# Patient Record
Sex: Female | Born: 1950 | Race: White | Hispanic: No | Marital: Married | Smoking: Former smoker
Health system: Southern US, Community
[De-identification: ages and names within clinical notes are randomized; demographics above are authoritative.]

## PROBLEM LIST (undated history)

## (undated) DIAGNOSIS — R112 Nausea with vomiting, unspecified: Secondary | ICD-10-CM

## (undated) DIAGNOSIS — M1990 Unspecified osteoarthritis, unspecified site: Secondary | ICD-10-CM

## (undated) DIAGNOSIS — R002 Palpitations: Secondary | ICD-10-CM

## (undated) DIAGNOSIS — Z9889 Other specified postprocedural states: Secondary | ICD-10-CM

## (undated) DIAGNOSIS — G479 Sleep disorder, unspecified: Secondary | ICD-10-CM

## (undated) DIAGNOSIS — T8859XA Other complications of anesthesia, initial encounter: Secondary | ICD-10-CM

## (undated) DIAGNOSIS — C439 Malignant melanoma of skin, unspecified: Secondary | ICD-10-CM

## (undated) DIAGNOSIS — IMO0001 Reserved for inherently not codable concepts without codable children: Secondary | ICD-10-CM

## (undated) DIAGNOSIS — M8580 Other specified disorders of bone density and structure, unspecified site: Secondary | ICD-10-CM

## (undated) DIAGNOSIS — I341 Nonrheumatic mitral (valve) prolapse: Secondary | ICD-10-CM

## (undated) DIAGNOSIS — B009 Herpesviral infection, unspecified: Secondary | ICD-10-CM

## (undated) DIAGNOSIS — N6009 Solitary cyst of unspecified breast: Secondary | ICD-10-CM

## (undated) DIAGNOSIS — T4145XA Adverse effect of unspecified anesthetic, initial encounter: Secondary | ICD-10-CM

## (undated) DIAGNOSIS — C4491 Basal cell carcinoma of skin, unspecified: Secondary | ICD-10-CM

## (undated) DIAGNOSIS — E785 Hyperlipidemia, unspecified: Secondary | ICD-10-CM

## (undated) DIAGNOSIS — L309 Dermatitis, unspecified: Secondary | ICD-10-CM

## (undated) DIAGNOSIS — K219 Gastro-esophageal reflux disease without esophagitis: Secondary | ICD-10-CM

## (undated) DIAGNOSIS — S83209A Unspecified tear of unspecified meniscus, current injury, unspecified knee, initial encounter: Secondary | ICD-10-CM

## (undated) DIAGNOSIS — K449 Diaphragmatic hernia without obstruction or gangrene: Secondary | ICD-10-CM

## (undated) HISTORY — DX: Herpesviral infection, unspecified: B00.9

## (undated) HISTORY — PX: MOLE REMOVAL: SHX2046

## (undated) HISTORY — PX: TONSILLECTOMY: SUR1361

## (undated) HISTORY — DX: Basal cell carcinoma of skin, unspecified: C44.91

## (undated) HISTORY — DX: Solitary cyst of unspecified breast: N60.09

## (undated) HISTORY — DX: Dermatitis, unspecified: L30.9

## (undated) HISTORY — DX: Other specified disorders of bone density and structure, unspecified site: M85.80

## (undated) HISTORY — PX: COLONOSCOPY: SHX174

## (undated) HISTORY — DX: Gastro-esophageal reflux disease without esophagitis: K21.9

## (undated) HISTORY — DX: Hyperlipidemia, unspecified: E78.5

## (undated) HISTORY — PX: JOINT REPLACEMENT: SHX530

## (undated) HISTORY — PX: MOHS SURGERY: SUR867

---

## 1987-10-08 HISTORY — PX: TUBAL LIGATION: SHX77

## 1993-10-07 HISTORY — PX: MELANOMA EXCISION: SHX5266

## 1995-04-07 ENCOUNTER — Encounter: Payer: Self-pay | Admitting: Family Medicine

## 1995-04-07 LAB — CONVERTED CEMR LAB

## 1998-03-24 ENCOUNTER — Other Ambulatory Visit: Admission: RE | Admit: 1998-03-24 | Discharge: 1998-03-24 | Payer: Self-pay | Admitting: Gynecology

## 1998-04-06 ENCOUNTER — Encounter: Payer: Self-pay | Admitting: Family Medicine

## 1998-04-06 LAB — CONVERTED CEMR LAB

## 1998-10-07 HISTORY — PX: HYSTEROSCOPY WITH RESECTOSCOPE: SHX5395

## 1998-12-06 HISTORY — PX: OTHER SURGICAL HISTORY: SHX169

## 1999-05-08 ENCOUNTER — Encounter: Payer: Self-pay | Admitting: Family Medicine

## 1999-05-08 LAB — CONVERTED CEMR LAB

## 1999-07-10 ENCOUNTER — Other Ambulatory Visit: Admission: RE | Admit: 1999-07-10 | Discharge: 1999-07-10 | Payer: Self-pay | Admitting: Gynecology

## 1999-08-06 ENCOUNTER — Encounter (INDEPENDENT_AMBULATORY_CARE_PROVIDER_SITE_OTHER): Payer: Self-pay

## 1999-08-06 ENCOUNTER — Ambulatory Visit (HOSPITAL_COMMUNITY): Admission: RE | Admit: 1999-08-06 | Discharge: 1999-08-06 | Payer: Self-pay | Admitting: Gynecology

## 1999-10-03 ENCOUNTER — Encounter: Payer: Self-pay | Admitting: Gynecology

## 1999-10-03 ENCOUNTER — Encounter: Admission: RE | Admit: 1999-10-03 | Discharge: 1999-10-03 | Payer: Self-pay | Admitting: Gynecology

## 1999-10-08 HISTORY — PX: VAGINAL HYSTERECTOMY: SUR661

## 1999-10-09 ENCOUNTER — Encounter: Payer: Self-pay | Admitting: Family Medicine

## 1999-10-09 LAB — CONVERTED CEMR LAB

## 1999-12-06 ENCOUNTER — Observation Stay (HOSPITAL_COMMUNITY): Admission: RE | Admit: 1999-12-06 | Discharge: 1999-12-07 | Payer: Self-pay | Admitting: Gynecology

## 2000-07-21 ENCOUNTER — Other Ambulatory Visit: Admission: RE | Admit: 2000-07-21 | Discharge: 2000-07-21 | Payer: Self-pay | Admitting: Gynecology

## 2001-10-12 ENCOUNTER — Other Ambulatory Visit: Admission: RE | Admit: 2001-10-12 | Discharge: 2001-10-12 | Payer: Self-pay | Admitting: Gynecology

## 2002-02-16 ENCOUNTER — Encounter: Admission: RE | Admit: 2002-02-16 | Discharge: 2002-02-16 | Payer: Self-pay | Admitting: Gynecology

## 2002-02-16 ENCOUNTER — Encounter: Payer: Self-pay | Admitting: Gynecology

## 2002-10-07 LAB — HM DEXA SCAN

## 2002-11-08 ENCOUNTER — Other Ambulatory Visit: Admission: RE | Admit: 2002-11-08 | Discharge: 2002-11-08 | Payer: Self-pay | Admitting: Gynecology

## 2003-03-14 ENCOUNTER — Encounter: Payer: Self-pay | Admitting: Gynecology

## 2003-03-14 ENCOUNTER — Encounter: Admission: RE | Admit: 2003-03-14 | Discharge: 2003-03-14 | Payer: Self-pay | Admitting: Gynecology

## 2003-11-28 ENCOUNTER — Encounter: Admission: RE | Admit: 2003-11-28 | Discharge: 2003-11-28 | Payer: Self-pay | Admitting: Family Medicine

## 2003-11-28 ENCOUNTER — Other Ambulatory Visit: Admission: RE | Admit: 2003-11-28 | Discharge: 2003-11-28 | Payer: Self-pay | Admitting: Gynecology

## 2003-12-08 LAB — FECAL OCCULT BLOOD, GUAIAC: Fecal Occult Blood: NEGATIVE

## 2004-06-07 HISTORY — PX: OTHER SURGICAL HISTORY: SHX169

## 2004-10-02 ENCOUNTER — Ambulatory Visit: Payer: Self-pay | Admitting: Family Medicine

## 2004-10-24 ENCOUNTER — Encounter: Admission: RE | Admit: 2004-10-24 | Discharge: 2004-10-24 | Payer: Self-pay | Admitting: Gynecology

## 2004-11-21 ENCOUNTER — Ambulatory Visit: Payer: Self-pay | Admitting: Family Medicine

## 2004-11-23 ENCOUNTER — Ambulatory Visit: Payer: Self-pay | Admitting: Family Medicine

## 2004-11-28 ENCOUNTER — Ambulatory Visit: Payer: Self-pay | Admitting: *Deleted

## 2004-12-03 ENCOUNTER — Other Ambulatory Visit: Admission: RE | Admit: 2004-12-03 | Discharge: 2004-12-03 | Payer: Self-pay | Admitting: Gynecology

## 2004-12-05 ENCOUNTER — Ambulatory Visit: Payer: Self-pay | Admitting: Internal Medicine

## 2004-12-14 ENCOUNTER — Ambulatory Visit: Payer: Self-pay | Admitting: Internal Medicine

## 2004-12-14 LAB — HM COLONOSCOPY

## 2004-12-25 ENCOUNTER — Ambulatory Visit: Payer: Self-pay | Admitting: Internal Medicine

## 2005-02-04 ENCOUNTER — Ambulatory Visit: Payer: Self-pay | Admitting: Family Medicine

## 2005-06-19 ENCOUNTER — Ambulatory Visit: Payer: Self-pay | Admitting: Family Medicine

## 2005-07-07 HISTORY — PX: OTHER SURGICAL HISTORY: SHX169

## 2005-07-08 ENCOUNTER — Other Ambulatory Visit: Admission: RE | Admit: 2005-07-08 | Discharge: 2005-07-08 | Payer: Self-pay | Admitting: Gynecology

## 2005-07-29 ENCOUNTER — Ambulatory Visit: Payer: Self-pay

## 2005-09-06 ENCOUNTER — Encounter: Payer: Self-pay | Admitting: Family Medicine

## 2005-09-06 LAB — CONVERTED CEMR LAB

## 2005-11-11 ENCOUNTER — Ambulatory Visit: Payer: Self-pay | Admitting: Family Medicine

## 2005-12-06 ENCOUNTER — Other Ambulatory Visit: Admission: RE | Admit: 2005-12-06 | Discharge: 2005-12-06 | Payer: Self-pay | Admitting: Gynecology

## 2005-12-11 ENCOUNTER — Encounter: Admission: RE | Admit: 2005-12-11 | Discharge: 2005-12-11 | Payer: Self-pay | Admitting: Gynecology

## 2006-01-27 ENCOUNTER — Ambulatory Visit: Payer: Self-pay | Admitting: Family Medicine

## 2006-05-09 ENCOUNTER — Ambulatory Visit: Payer: Self-pay | Admitting: Internal Medicine

## 2006-05-14 ENCOUNTER — Ambulatory Visit: Payer: Self-pay

## 2006-05-21 ENCOUNTER — Ambulatory Visit: Payer: Self-pay | Admitting: *Deleted

## 2006-06-03 ENCOUNTER — Ambulatory Visit: Payer: Self-pay | Admitting: Family Medicine

## 2006-06-07 HISTORY — PX: ESOPHAGOGASTRODUODENOSCOPY: SHX1529

## 2006-06-10 ENCOUNTER — Encounter: Admission: RE | Admit: 2006-06-10 | Discharge: 2006-06-10 | Payer: Self-pay | Admitting: Family Medicine

## 2006-06-19 ENCOUNTER — Ambulatory Visit: Payer: Self-pay | Admitting: Internal Medicine

## 2006-06-23 ENCOUNTER — Ambulatory Visit: Payer: Self-pay | Admitting: Internal Medicine

## 2006-06-27 ENCOUNTER — Ambulatory Visit: Payer: Self-pay | Admitting: Family Medicine

## 2006-07-02 ENCOUNTER — Ambulatory Visit: Payer: Self-pay | Admitting: Cardiology

## 2006-08-20 ENCOUNTER — Ambulatory Visit: Payer: Self-pay | Admitting: Family Medicine

## 2006-10-18 ENCOUNTER — Encounter: Admission: RE | Admit: 2006-10-18 | Discharge: 2006-10-18 | Payer: Self-pay | Admitting: Family Medicine

## 2006-10-31 ENCOUNTER — Ambulatory Visit: Payer: Self-pay | Admitting: Family Medicine

## 2006-12-03 ENCOUNTER — Ambulatory Visit: Payer: Self-pay | Admitting: Internal Medicine

## 2006-12-03 LAB — CONVERTED CEMR LAB
Bilirubin Urine: NEGATIVE
Ketones, ur: NEGATIVE mg/dL
Leukocytes, UA: NEGATIVE
Specific Gravity, Urine: 1.01 (ref 1.000–1.03)
Urine Glucose: NEGATIVE mg/dL
pH: 6.5 (ref 5.0–8.0)

## 2006-12-06 LAB — CONVERTED CEMR LAB: Pap Smear: NORMAL

## 2006-12-15 ENCOUNTER — Other Ambulatory Visit: Admission: RE | Admit: 2006-12-15 | Discharge: 2006-12-15 | Payer: Self-pay | Admitting: Gynecology

## 2006-12-25 ENCOUNTER — Ambulatory Visit: Payer: Self-pay | Admitting: Family Medicine

## 2006-12-30 ENCOUNTER — Ambulatory Visit: Payer: Self-pay | Admitting: Family Medicine

## 2006-12-30 DIAGNOSIS — A6000 Herpesviral infection of urogenital system, unspecified: Secondary | ICD-10-CM | POA: Insufficient documentation

## 2007-01-12 ENCOUNTER — Encounter: Payer: Self-pay | Admitting: Family Medicine

## 2007-01-12 DIAGNOSIS — K449 Diaphragmatic hernia without obstruction or gangrene: Secondary | ICD-10-CM | POA: Insufficient documentation

## 2007-01-28 ENCOUNTER — Encounter: Admission: RE | Admit: 2007-01-28 | Discharge: 2007-01-28 | Payer: Self-pay | Admitting: Gynecology

## 2007-02-05 DIAGNOSIS — C4491 Basal cell carcinoma of skin, unspecified: Secondary | ICD-10-CM

## 2007-02-05 HISTORY — DX: Basal cell carcinoma of skin, unspecified: C44.91

## 2007-03-11 ENCOUNTER — Ambulatory Visit: Payer: Self-pay | Admitting: Family Medicine

## 2007-03-11 DIAGNOSIS — R5381 Other malaise: Secondary | ICD-10-CM | POA: Insufficient documentation

## 2007-03-11 DIAGNOSIS — R5383 Other fatigue: Secondary | ICD-10-CM

## 2007-03-13 ENCOUNTER — Ambulatory Visit: Payer: Self-pay | Admitting: Family Medicine

## 2007-03-13 DIAGNOSIS — E7800 Pure hypercholesterolemia, unspecified: Secondary | ICD-10-CM | POA: Insufficient documentation

## 2007-03-16 LAB — CONVERTED CEMR LAB
ALT: 21 units/L (ref 0–40)
AST: 24 units/L (ref 0–37)
Albumin: 3.8 g/dL (ref 3.5–5.2)
Alkaline Phosphatase: 57 units/L (ref 39–117)
BUN: 12 mg/dL (ref 6–23)
Basophils Absolute: 0.1 10*3/uL (ref 0.0–0.1)
Basophils Relative: 1.5 % — ABNORMAL HIGH (ref 0.0–1.0)
Bilirubin, Direct: 0.1 mg/dL (ref 0.0–0.3)
CO2: 31 meq/L (ref 19–32)
Calcium: 9.5 mg/dL (ref 8.4–10.5)
Chloride: 104 meq/L (ref 96–112)
Cholesterol: 176 mg/dL (ref 0–200)
Creatinine, Ser: 0.8 mg/dL (ref 0.4–1.2)
Eosinophils Absolute: 0.1 10*3/uL (ref 0.0–0.6)
Eosinophils Relative: 1.9 % (ref 0.0–5.0)
GFR calc Af Amer: 96 mL/min
GFR calc non Af Amer: 79 mL/min
Glucose, Bld: 86 mg/dL (ref 70–99)
HCT: 40.7 % (ref 36.0–46.0)
HDL: 38.2 mg/dL — ABNORMAL LOW (ref >39.0)
Hemoglobin: 14.4 g/dL (ref 12.0–15.0)
LDL Cholesterol: 105 mg/dL — ABNORMAL HIGH (ref 0–99)
Lymphocytes Relative: 38.3 % (ref 12.0–46.0)
MCHC: 35.4 g/dL (ref 30.0–36.0)
MCV: 94.1 fL (ref 78.0–100.0)
Monocytes Absolute: 0.3 10*3/uL (ref 0.2–0.7)
Monocytes Relative: 7.1 % (ref 3.0–11.0)
Neutro Abs: 2.3 10*3/uL (ref 1.4–7.7)
Neutrophils Relative %: 51.2 % (ref 43.0–77.0)
Platelets: 185 10*3/uL (ref 150–400)
Potassium: 4 meq/L (ref 3.5–5.1)
RBC: 4.32 M/uL (ref 3.87–5.11)
RDW: 12.1 % (ref 11.5–14.6)
Sodium: 141 meq/L (ref 135–145)
TSH: 1.29 microintl units/mL (ref 0.35–5.50)
Total Bilirubin: 0.9 mg/dL (ref 0.3–1.2)
Total CHOL/HDL Ratio: 4.6
Total Protein: 6.6 g/dL (ref 6.0–8.3)
Triglycerides: 163 mg/dL — ABNORMAL HIGH (ref 0–149)
VLDL: 33 mg/dL (ref 0–40)
WBC: 4.5 10*3/uL (ref 4.5–10.5)

## 2007-03-17 ENCOUNTER — Telehealth (INDEPENDENT_AMBULATORY_CARE_PROVIDER_SITE_OTHER): Payer: Self-pay | Admitting: *Deleted

## 2007-05-29 ENCOUNTER — Ambulatory Visit: Payer: Self-pay | Admitting: Cardiovascular Disease

## 2007-05-29 ENCOUNTER — Telehealth (INDEPENDENT_AMBULATORY_CARE_PROVIDER_SITE_OTHER): Payer: Self-pay | Admitting: *Deleted

## 2007-05-29 ENCOUNTER — Ambulatory Visit: Payer: Self-pay | Admitting: Family Medicine

## 2007-10-09 ENCOUNTER — Ambulatory Visit: Payer: Self-pay | Admitting: Family Medicine

## 2007-10-09 LAB — CONVERTED CEMR LAB
Bilirubin Urine: NEGATIVE
Ketones, urine, test strip: NEGATIVE
Nitrite: NEGATIVE
Protein, U semiquant: NEGATIVE
Urobilinogen, UA: 0.2
pH: 5

## 2007-10-10 ENCOUNTER — Encounter: Payer: Self-pay | Admitting: Family Medicine

## 2007-12-16 ENCOUNTER — Other Ambulatory Visit: Admission: RE | Admit: 2007-12-16 | Discharge: 2007-12-16 | Payer: Self-pay | Admitting: Gynecology

## 2008-02-12 ENCOUNTER — Encounter (INDEPENDENT_AMBULATORY_CARE_PROVIDER_SITE_OTHER): Payer: Self-pay | Admitting: *Deleted

## 2008-03-14 ENCOUNTER — Ambulatory Visit: Payer: Self-pay | Admitting: Family Medicine

## 2008-03-14 DIAGNOSIS — M8580 Other specified disorders of bone density and structure, unspecified site: Secondary | ICD-10-CM

## 2008-03-15 LAB — CONVERTED CEMR LAB
AST: 29 units/L (ref 0–37)
Albumin: 4.1 g/dL (ref 3.5–5.2)
Alkaline Phosphatase: 64 units/L (ref 39–117)
BUN: 10 mg/dL (ref 6–23)
Bilirubin, Direct: 0.1 mg/dL (ref 0.0–0.3)
CO2: 31 meq/L (ref 19–32)
Chloride: 103 meq/L (ref 96–112)
Eosinophils Relative: 1.9 % (ref 0.0–5.0)
Glucose, Bld: 80 mg/dL (ref 70–99)
HCT: 42.9 % (ref 36.0–46.0)
HDL: 38.9 mg/dL — ABNORMAL LOW (ref >39.0)
LDL Cholesterol: 91 mg/dL (ref 0–99)
Lymphocytes Relative: 39 % (ref 12.0–46.0)
Monocytes Relative: 5.8 % (ref 3.0–12.0)
Neutrophils Relative %: 52.6 % (ref 43.0–77.0)
Platelets: 215 10*3/uL (ref 150–400)
Potassium: 4.2 meq/L (ref 3.5–5.1)
RDW: 12.1 % (ref 11.5–14.6)
Sodium: 141 meq/L (ref 135–145)
Total CHOL/HDL Ratio: 4.2
Total Protein: 6.9 g/dL (ref 6.0–8.3)
Triglycerides: 163 mg/dL — ABNORMAL HIGH (ref 0–149)
VLDL: 33 mg/dL (ref 0–40)
WBC: 4.1 10*3/uL — ABNORMAL LOW (ref 4.5–10.5)

## 2008-03-17 ENCOUNTER — Encounter: Payer: Self-pay | Admitting: Family Medicine

## 2008-03-30 ENCOUNTER — Ambulatory Visit: Payer: Self-pay | Admitting: Internal Medicine

## 2008-03-30 LAB — CONVERTED CEMR LAB
Bacteria, UA: 0
Glucose, Urine, Semiquant: NEGATIVE
Ketones, urine, test strip: NEGATIVE
Specific Gravity, Urine: 1.025
Urobilinogen, UA: 0.2

## 2008-03-31 ENCOUNTER — Encounter: Payer: Self-pay | Admitting: Family Medicine

## 2008-05-04 ENCOUNTER — Ambulatory Visit: Payer: Self-pay | Admitting: Family Medicine

## 2008-05-06 ENCOUNTER — Encounter: Payer: Self-pay | Admitting: Family Medicine

## 2008-05-06 LAB — CONVERTED CEMR LAB: Vit D, 1,25-Dihydroxy: 46 (ref 30–89)

## 2008-05-16 ENCOUNTER — Encounter: Admission: RE | Admit: 2008-05-16 | Discharge: 2008-05-16 | Payer: Self-pay | Admitting: Gynecology

## 2008-06-17 ENCOUNTER — Ambulatory Visit: Payer: Self-pay | Admitting: Family Medicine

## 2008-06-20 ENCOUNTER — Encounter: Admission: RE | Admit: 2008-06-20 | Discharge: 2008-06-20 | Payer: Self-pay | Admitting: Family Medicine

## 2008-06-21 ENCOUNTER — Telehealth (INDEPENDENT_AMBULATORY_CARE_PROVIDER_SITE_OTHER): Payer: Self-pay | Admitting: Internal Medicine

## 2008-07-28 ENCOUNTER — Ambulatory Visit: Payer: Self-pay | Admitting: Family Medicine

## 2008-07-28 DIAGNOSIS — K219 Gastro-esophageal reflux disease without esophagitis: Secondary | ICD-10-CM | POA: Insufficient documentation

## 2008-07-28 LAB — CONVERTED CEMR LAB
Bilirubin Urine: NEGATIVE
Glucose, Urine, Semiquant: NEGATIVE
Protein, U semiquant: NEGATIVE
Specific Gravity, Urine: <1.005
WBC Urine, dipstick: NEGATIVE
pH: 5

## 2008-07-29 ENCOUNTER — Encounter: Admission: RE | Admit: 2008-07-29 | Discharge: 2008-07-29 | Payer: Self-pay | Admitting: Family Medicine

## 2008-07-29 LAB — CONVERTED CEMR LAB
ALT: 35 units/L (ref 0–35)
Albumin: 3.9 g/dL (ref 3.5–5.2)
BUN: 14 mg/dL (ref 6–23)
Chloride: 103 meq/L (ref 96–112)
Eosinophils Relative: 2.4 % (ref 0.0–5.0)
GFR calc Af Amer: 95 mL/min
GFR calc non Af Amer: 79 mL/min
Lymphocytes Relative: 33.1 % (ref 12.0–46.0)
Monocytes Relative: 6.7 % (ref 3.0–12.0)
Phosphorus: 3.9 mg/dL (ref 2.3–4.6)
Platelets: 216 10*3/uL (ref 150–400)
Potassium: 4.1 meq/L (ref 3.5–5.1)
RDW: 12.1 % (ref 11.5–14.6)
Sodium: 140 meq/L (ref 135–145)
Total Protein: 6.9 g/dL (ref 6.0–8.3)
WBC: 5.1 10*3/uL (ref 4.5–10.5)

## 2008-08-01 ENCOUNTER — Telehealth: Payer: Self-pay | Admitting: Family Medicine

## 2008-08-12 ENCOUNTER — Encounter: Payer: Self-pay | Admitting: Family Medicine

## 2008-08-31 ENCOUNTER — Encounter: Admission: RE | Admit: 2008-08-31 | Discharge: 2008-08-31 | Payer: Self-pay | Admitting: Sports Medicine

## 2008-08-31 ENCOUNTER — Encounter: Payer: Self-pay | Admitting: Family Medicine

## 2008-11-09 ENCOUNTER — Ambulatory Visit: Payer: Self-pay | Admitting: Family Medicine

## 2008-11-09 DIAGNOSIS — J309 Allergic rhinitis, unspecified: Secondary | ICD-10-CM | POA: Insufficient documentation

## 2008-11-09 LAB — CONVERTED CEMR LAB: Rapid Strep: NEGATIVE

## 2008-12-23 ENCOUNTER — Encounter: Payer: Self-pay | Admitting: Gynecology

## 2008-12-23 ENCOUNTER — Other Ambulatory Visit: Admission: RE | Admit: 2008-12-23 | Discharge: 2008-12-23 | Payer: Self-pay | Admitting: Gynecology

## 2008-12-23 ENCOUNTER — Ambulatory Visit: Payer: Self-pay | Admitting: Gynecology

## 2009-02-24 ENCOUNTER — Ambulatory Visit: Payer: Self-pay | Admitting: Family Medicine

## 2009-02-24 LAB — CONVERTED CEMR LAB
Blood in Urine, dipstick: NEGATIVE
Glucose, Urine, Semiquant: NEGATIVE
Nitrite: NEGATIVE
pH: 7

## 2009-03-17 ENCOUNTER — Ambulatory Visit: Payer: Self-pay | Admitting: Family Medicine

## 2009-04-21 ENCOUNTER — Ambulatory Visit: Payer: Self-pay | Admitting: Family Medicine

## 2009-04-21 DIAGNOSIS — E559 Vitamin D deficiency, unspecified: Secondary | ICD-10-CM | POA: Insufficient documentation

## 2009-04-27 LAB — CONVERTED CEMR LAB
ALT: 25 units/L (ref 0–35)
AST: 30 units/L (ref 0–37)
Albumin: 4.2 g/dL (ref 3.5–5.2)
Chloride: 107 meq/L (ref 96–112)
Eosinophils Relative: 1.3 % (ref 0.0–5.0)
GFR calc non Af Amer: 68.58 mL/min (ref >60)
Glucose, Bld: 82 mg/dL (ref 70–99)
HCT: 44.3 % (ref 36.0–46.0)
Hemoglobin: 15.3 g/dL — ABNORMAL HIGH (ref 12.0–15.0)
Lymphs Abs: 1.6 10*3/uL (ref 0.7–4.0)
Monocytes Relative: 6.5 % (ref 3.0–12.0)
Neutro Abs: 2.4 10*3/uL (ref 1.4–7.7)
Potassium: 4.3 meq/L (ref 3.5–5.1)
RDW: 11.9 % (ref 11.5–14.6)
Sodium: 142 meq/L (ref 135–145)
TSH: 0.95 microintl units/mL (ref 0.35–5.50)
VLDL: 22 mg/dL (ref 0.0–40.0)
WBC: 4.4 10*3/uL — ABNORMAL LOW (ref 4.5–10.5)

## 2009-05-17 ENCOUNTER — Encounter: Admission: RE | Admit: 2009-05-17 | Discharge: 2009-05-17 | Payer: Self-pay | Admitting: Gynecology

## 2009-06-27 ENCOUNTER — Telehealth: Payer: Self-pay | Admitting: Family Medicine

## 2009-06-27 ENCOUNTER — Emergency Department (HOSPITAL_COMMUNITY): Admission: EM | Admit: 2009-06-27 | Discharge: 2009-06-28 | Payer: Self-pay | Admitting: Emergency Medicine

## 2009-07-03 ENCOUNTER — Ambulatory Visit: Payer: Self-pay | Admitting: Family Medicine

## 2009-08-04 ENCOUNTER — Telehealth: Payer: Self-pay | Admitting: Family Medicine

## 2009-09-13 ENCOUNTER — Ambulatory Visit: Payer: Self-pay | Admitting: Family Medicine

## 2009-10-19 ENCOUNTER — Ambulatory Visit: Payer: Self-pay | Admitting: Family Medicine

## 2009-10-19 DIAGNOSIS — J328 Other chronic sinusitis: Secondary | ICD-10-CM | POA: Insufficient documentation

## 2009-11-22 ENCOUNTER — Encounter: Payer: Self-pay | Admitting: Family Medicine

## 2009-12-26 ENCOUNTER — Ambulatory Visit: Payer: Self-pay | Admitting: Family Medicine

## 2009-12-26 ENCOUNTER — Ambulatory Visit: Payer: Self-pay | Admitting: Cardiovascular Disease

## 2009-12-26 DIAGNOSIS — R9431 Abnormal electrocardiogram [ECG] [EKG]: Secondary | ICD-10-CM

## 2010-01-05 LAB — HM DEXA SCAN

## 2010-01-31 ENCOUNTER — Ambulatory Visit: Payer: Self-pay | Admitting: Gynecology

## 2010-01-31 ENCOUNTER — Other Ambulatory Visit: Admission: RE | Admit: 2010-01-31 | Discharge: 2010-01-31 | Payer: Self-pay | Admitting: Gynecology

## 2010-02-16 ENCOUNTER — Ambulatory Visit: Payer: Self-pay | Admitting: Gynecology

## 2010-02-27 ENCOUNTER — Ambulatory Visit: Payer: Self-pay | Admitting: Family Medicine

## 2010-02-27 LAB — CONVERTED CEMR LAB
Casts: 0 /lpf
Nitrite: NEGATIVE
Urine crystals, microscopic: 0 /hpf

## 2010-02-28 ENCOUNTER — Encounter: Payer: Self-pay | Admitting: Family Medicine

## 2010-03-02 ENCOUNTER — Encounter: Payer: Self-pay | Admitting: Family Medicine

## 2010-03-14 ENCOUNTER — Ambulatory Visit: Payer: Self-pay | Admitting: Family Medicine

## 2010-03-14 LAB — CONVERTED CEMR LAB
Bacteria, UA: 0
Bilirubin Urine: NEGATIVE
Casts: 0 /LPF
Glucose, Urine, Semiquant: NEGATIVE
Ketones, urine, test strip: NEGATIVE
Nitrite: NEGATIVE
RBC / HPF: 0
Specific Gravity, Urine: 1.025
Urine crystals, microscopic: 0 /HPF
Urobilinogen, UA: 0.2
WBC Urine, dipstick: NEGATIVE
Yeast, UA: 0
pH: 5

## 2010-06-07 LAB — HM MAMMOGRAPHY: HM Mammogram: NORMAL

## 2010-06-25 ENCOUNTER — Ambulatory Visit: Payer: Self-pay | Admitting: Family Medicine

## 2010-06-25 DIAGNOSIS — K644 Residual hemorrhoidal skin tags: Secondary | ICD-10-CM | POA: Insufficient documentation

## 2010-06-28 ENCOUNTER — Encounter (INDEPENDENT_AMBULATORY_CARE_PROVIDER_SITE_OTHER): Payer: Self-pay | Admitting: *Deleted

## 2010-06-29 ENCOUNTER — Encounter (INDEPENDENT_AMBULATORY_CARE_PROVIDER_SITE_OTHER): Payer: Self-pay | Admitting: Internal Medicine

## 2010-06-29 ENCOUNTER — Ambulatory Visit: Payer: Self-pay | Admitting: Cardiology

## 2010-06-29 ENCOUNTER — Telehealth: Payer: Self-pay | Admitting: Cardiovascular Disease

## 2010-06-29 ENCOUNTER — Encounter: Payer: Self-pay | Admitting: Cardiovascular Disease

## 2010-06-29 ENCOUNTER — Observation Stay (HOSPITAL_COMMUNITY): Admission: EM | Admit: 2010-06-29 | Discharge: 2010-06-29 | Payer: Self-pay | Admitting: Emergency Medicine

## 2010-06-29 ENCOUNTER — Encounter (INDEPENDENT_AMBULATORY_CARE_PROVIDER_SITE_OTHER): Payer: Self-pay | Admitting: *Deleted

## 2010-06-29 HISTORY — PX: TRANSTHORACIC ECHOCARDIOGRAM: SHX275

## 2010-07-04 ENCOUNTER — Encounter: Admission: RE | Admit: 2010-07-04 | Discharge: 2010-07-04 | Payer: Self-pay | Admitting: Gynecology

## 2010-07-05 ENCOUNTER — Telehealth (INDEPENDENT_AMBULATORY_CARE_PROVIDER_SITE_OTHER): Payer: Self-pay | Admitting: *Deleted

## 2010-07-09 ENCOUNTER — Ambulatory Visit: Payer: Self-pay | Admitting: Cardiovascular Disease

## 2010-07-09 ENCOUNTER — Encounter: Payer: Self-pay | Admitting: Cardiovascular Disease

## 2010-07-09 ENCOUNTER — Encounter (HOSPITAL_COMMUNITY): Admission: RE | Admit: 2010-07-09 | Discharge: 2010-07-23 | Payer: Self-pay | Admitting: Cardiovascular Disease

## 2010-07-09 ENCOUNTER — Encounter: Payer: Self-pay | Admitting: Cardiology

## 2010-07-09 ENCOUNTER — Ambulatory Visit: Payer: Self-pay

## 2010-07-09 DIAGNOSIS — IMO0001 Reserved for inherently not codable concepts without codable children: Secondary | ICD-10-CM

## 2010-07-09 HISTORY — DX: Reserved for inherently not codable concepts without codable children: IMO0001

## 2010-07-10 ENCOUNTER — Encounter: Payer: Self-pay | Admitting: Cardiovascular Disease

## 2010-07-30 ENCOUNTER — Ambulatory Visit: Payer: Self-pay | Admitting: Family Medicine

## 2010-07-30 LAB — CONVERTED CEMR LAB
Bilirubin Urine: NEGATIVE
Ketones, urine, test strip: NEGATIVE
Urobilinogen, UA: 0.2
pH: 5

## 2010-07-31 ENCOUNTER — Encounter: Payer: Self-pay | Admitting: Family Medicine

## 2010-08-16 ENCOUNTER — Ambulatory Visit: Payer: Self-pay | Admitting: Gynecology

## 2010-08-22 ENCOUNTER — Telehealth (INDEPENDENT_AMBULATORY_CARE_PROVIDER_SITE_OTHER): Payer: Self-pay | Admitting: *Deleted

## 2010-09-12 ENCOUNTER — Encounter: Payer: Self-pay | Admitting: Family Medicine

## 2010-09-12 ENCOUNTER — Ambulatory Visit: Payer: Self-pay | Admitting: Family Medicine

## 2010-09-12 LAB — CONVERTED CEMR LAB
Albumin: 4 g/dL (ref 3.5–5.2)
Basophils Absolute: 0 10*3/uL (ref 0.0–0.1)
Basophils Relative: 0.4 % (ref 0.0–3.0)
CO2: 30 meq/L (ref 19–32)
Calcium: 9.7 mg/dL (ref 8.4–10.5)
Chloride: 104 meq/L (ref 96–112)
Eosinophils Absolute: 0.1 10*3/uL (ref 0.0–0.7)
Glucose, Bld: 90 mg/dL (ref 70–99)
HCT: 41.8 % (ref 36.0–46.0)
HDL: 48 mg/dL (ref >39.00)
Hemoglobin: 14.6 g/dL (ref 12.0–15.0)
Lymphs Abs: 1.7 10*3/uL (ref 0.7–4.0)
MCHC: 35 g/dL (ref 30.0–36.0)
MCV: 96.3 fL (ref 78.0–100.0)
Monocytes Absolute: 0.3 10*3/uL (ref 0.1–1.0)
Neutro Abs: 2.3 10*3/uL (ref 1.4–7.7)
RDW: 13.2 % (ref 11.5–14.6)
Sodium: 142 meq/L (ref 135–145)
TSH: 1.48 microintl units/mL (ref 0.35–5.50)
Total Protein: 6.7 g/dL (ref 6.0–8.3)
Triglycerides: 119 mg/dL (ref 0.0–149.0)

## 2010-09-14 ENCOUNTER — Ambulatory Visit: Payer: Self-pay | Admitting: Family Medicine

## 2010-09-14 DIAGNOSIS — R4589 Other symptoms and signs involving emotional state: Secondary | ICD-10-CM | POA: Insufficient documentation

## 2010-09-14 DIAGNOSIS — R070 Pain in throat: Secondary | ICD-10-CM | POA: Insufficient documentation

## 2010-10-12 ENCOUNTER — Ambulatory Visit
Admission: RE | Admit: 2010-10-12 | Discharge: 2010-10-12 | Payer: Self-pay | Source: Home / Self Care | Attending: Family Medicine | Admitting: Family Medicine

## 2010-10-12 LAB — CONVERTED CEMR LAB
Bilirubin Urine: NEGATIVE
Glucose, Urine, Semiquant: NEGATIVE
Ketones, urine, test strip: NEGATIVE
Protein, U semiquant: NEGATIVE
Urobilinogen, UA: 0.2
pH: 8.5

## 2010-10-13 ENCOUNTER — Encounter: Payer: Self-pay | Admitting: Family Medicine

## 2010-10-23 ENCOUNTER — Ambulatory Visit
Admission: RE | Admit: 2010-10-23 | Discharge: 2010-10-23 | Payer: Self-pay | Source: Home / Self Care | Attending: Family Medicine | Admitting: Family Medicine

## 2010-10-23 LAB — CONVERTED CEMR LAB
Bilirubin Urine: NEGATIVE
Nitrite: NEGATIVE
Specific Gravity, Urine: 1.01
pH: 6

## 2010-10-24 ENCOUNTER — Encounter: Payer: Self-pay | Admitting: Family Medicine

## 2010-10-28 ENCOUNTER — Encounter: Payer: Self-pay | Admitting: Sports Medicine

## 2010-10-29 ENCOUNTER — Ambulatory Visit
Admission: RE | Admit: 2010-10-29 | Discharge: 2010-10-29 | Payer: Self-pay | Source: Home / Self Care | Attending: Family Medicine | Admitting: Family Medicine

## 2010-10-29 ENCOUNTER — Other Ambulatory Visit: Payer: Self-pay | Admitting: Family Medicine

## 2010-10-29 LAB — HEPATIC FUNCTION PANEL
ALT: 18 U/L (ref 0–35)
AST: 26 U/L (ref 0–37)
Albumin: 3.7 g/dL (ref 3.5–5.2)
Bilirubin, Direct: 0.1 mg/dL (ref 0.0–0.3)
Total Bilirubin: 0.6 mg/dL (ref 0.3–1.2)

## 2010-10-29 LAB — CBC WITH DIFFERENTIAL/PLATELET
Basophils Absolute: 0 10*3/uL (ref 0.0–0.1)
Basophils Relative: 0.6 % (ref 0.0–3.0)
Eosinophils Relative: 1.5 % (ref 0.0–5.0)
Neutrophils Relative %: 61.5 % (ref 43.0–77.0)
Platelets: 188 10*3/uL (ref 150.0–400.0)
WBC: 4.3 10*3/uL — ABNORMAL LOW (ref 4.5–10.5)

## 2010-10-29 LAB — BASIC METABOLIC PANEL
CO2: 29 mEq/L (ref 19–32)
Calcium: 9.2 mg/dL (ref 8.4–10.5)
Chloride: 104 mEq/L (ref 96–112)
Glucose, Bld: 73 mg/dL (ref 70–99)

## 2010-10-29 LAB — CONVERTED CEMR LAB
Bilirubin Urine: NEGATIVE
Glucose, Urine, Semiquant: NEGATIVE
Protein, U semiquant: NEGATIVE
Specific Gravity, Urine: 1.015
Yeast, UA: 0
pH: 6

## 2010-11-06 ENCOUNTER — Telehealth: Payer: Self-pay | Admitting: Internal Medicine

## 2010-11-06 ENCOUNTER — Ambulatory Visit
Admission: RE | Admit: 2010-11-06 | Discharge: 2010-11-06 | Payer: Self-pay | Source: Home / Self Care | Attending: Family Medicine | Admitting: Family Medicine

## 2010-11-06 ENCOUNTER — Encounter: Payer: Self-pay | Admitting: Internal Medicine

## 2010-11-06 NOTE — Assessment & Plan Note (Signed)
Summary: LEFT KNEE PAIN/CLE   Vital Signs:  Patient profile:   60 year old female Height:      65 inches Weight:      177.25 pounds BMI:     29.60 Temp:     98.1 degrees F oral Pulse rate:   64 / minute Pulse rhythm:   regular BP sitting:   112 / 68  (left arm) Cuff size:   regular  Vitals Entered By: Lewanda Rife LPN (June 25, 2010 12:04 PM) CC: left knee pain on and off for months. Last 3 weeks pain is worse. Pain scale today when pt is walking is a 9.   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 helped some   never had checked or x ray before    started taking some pain pills -- her friends meloxicam -- it helped and pain got worse off of it  (was on it for a week)  some hemorroid problems- rectal pain / ?  prolapse -- some brb to wipe  strains some  used prep H  Allergies: 1)  Codeine 2)  * Toprol 3)  Biaxin 4)  Amoxicillin 5)  * Zegrid  Past History:  Past Medical History: Last updated: 07/28/2008 basal cell skin ca 5/08 melanoma  chronic L sided abd/CW pain hyperlipidemia  HSV 2  GERD --HH SVT hx of  chronic fatigue   osteopenia    derm-- Dr Nicholas Lose   Past Surgical History: Last updated: 07/28/2008 MRI neck C4-C5 herniation small stenosis mild C4-5, C5-6  3/00 Gyn surgery, uterine mass (not malignant) 10/00 Hysterectomy melanoma  Dexa- osteopenia 2004 Stress cardiolite neg. EF 47% 4/05 Carotid left- mild recheck 1year 9/05 Colonocopy neg. 3/06 Dexa  (Gyn's office)  Carotid normal  10/06 btl  Nuclear stress test neg. 8/07 EGD normal 9/07 CT abd/pelvis (no contrast) neg., no stones 9/07 abn mole removed from leg- ? basal cell   Family History: Last updated: 03/14/2008 father CAD in 51s, ETOH mother uterine ca, HTN, obese  GM DM in older age GF melamoma  P aunt CAD Puncle CAD  Social History: Last updated: 03/14/2008 Patient is a former smoker. - quit 30 years ago no alcohol  married  cleans houses- active job  Risk Factors: Smoking Status: quit (01/12/2007)  Review of Systems General:  Denies chills, fatigue, fever, loss of appetite, and malaise. Eyes:  Denies blurring and eye irritation. CV:  Denies chest pain or discomfort, palpitations, and shortness of breath with exertion. Resp:  Denies cough and shortness of breath. GI:  Complains of hemorrhoids; some brb over the counter . GU:  Denies discharge and dysuria. MS:  Complains of joint pain and stiffness; denies joint redness, joint swelling, cramps, and muscle weakness. Derm:  Denies itching, lesion(s), poor wound healing, and rash. Neuro:  Denies numbness and tingling. Psych:  Denies anxiety and depression. Endo:  Denies cold intolerance, heat intolerance, and polyuria.  Heme:  Denies abnormal bruising.  Physical Exam  General:  overweight but generally well appearing  Head:  normocephalic, atraumatic, and no abnormalities observed.   Mouth:  pharynx pink and moist.   Lungs:  Normal respiratory effort, chest expands symmetrically. Lungs are clear to auscultation, no crackles or wheezes. Heart:  Normal rate and regular rhythm. S1 and S2 normal without gallop, murmur, click, rub or other extra sounds. Msk:  L knee no swelling or eff no patellar tenderness or hypermobility  tender med joint line no crepitice pain to flex over 90 deg and slt pos mc murray nl lachman/ drawer- is stable  gait favors R leg  Pulses:  R and L carotid,radial,femoral,dorsalis pedis and posterior tibial pulses are  full and equal bilaterally Extremities:  No clubbing, cyanosis, edema, or deformity noted with normal full range of motion of all joints.   Neurologic:  sensation intact to light touch and DTRs symmetrical and normal.   Skin:  Intact without suspicious lesions or rashes Inguinal Nodes:  No significant adenopathy Psych:  normal affect, talkative and pleasant    Impression & Recommendations:  Problem # 1:  KNEE PAIN, LEFT (ICD-719.46) Assessment New quite severe at times - worse with wt bearing and use  some medial pain - so OA is poss as well as patellofemoral (no crepitice, though) will tx with mobic (stop aleve), ice / patellar stabilizing brace x ray today and update The following medications were removed from the medication list:    Aleve 220 Mg Tabs (Naproxen sodium) .Marland Kitchen... As needed pain with food Her updated medication list for this problem includes:    Mobic 15 Mg Tabs (Meloxicam) .Marland Kitchen... 1 by mouth once daily with food for knee pain  Orders: T-Knee Left 2 view (73560TC)  Problem # 2:  EXTERNAL HEMORRHOIDS WITHOUT MENTION COMP (ICD-455.3) Assessment: New per pt - irritated with straining  did not examine today recommend sympt care- see pt instructions   anusol hc px  f/u for exam if not imp avoid straining  Complete Medication List: 1)  Atenolol 25 Mg Tabs (Atenolol) .... Take one half by mouth daily 2)  Calcium 600 Tabs (Calcium carbonate tabs) .... Take two by mouth daily 3)  5 Htp Over The Counter 100 Mg  .Marland KitchenMarland Kitchen. 1 by mouth once daily 4)  Mvi  .Marland Kitchen.. 1 by mouth once daily 5)  Magnesium ? Dose  .Marland KitchenMarland Kitchen. 1 by mouth once daily 6)  Cranberry Tablets  .Marland KitchenMarland Kitchen. 1 by mouth once daily 7)  Tylenol Pm  .... As needed pain/insomnia at bedtime 8)  Vitamin D 1000 Unit Tabs (Cholecalciferol) .Marland Kitchen.. 1 daily by mouth 9)  Aspirin 81mg   .... 1 by mouth once daily 10)  Acyclovir  .... Takes it twice weekly 11)  Nexium 40 Mg Cpdr (Esomeprazole magnesium) .... 2 by mouth once daily 12)  Mobic 15 Mg  Tabs (Meloxicam) .Marland Kitchen.. 1 by mouth once daily with food for knee pain 13)  Anusol-hc 2.5 % Crea (Hydrocortisone) .... Apply to affected area once daily as needed for hemorroids  Patient Instructions: 1)  try tucks pads over the counter after bowel movements and try not to strain  2)  continue prep H  3)  try anusol hc -- if not improved - schedule follow up for exam  4)  use ice on knee- especially after walking  5)  use your knee brace when you work  6)  mobic -- take daily with food for knee pain- stop if GI  upset  Prescriptions: ANUSOL-HC 2.5 % CREA (HYDROCORTISONE) apply to affected area once daily as needed for hemorroids  #1 small x 1   Entered and Authorized by:   Judith Part MD   Signed by:   Judith Part MD on 06/25/2010   Method used:   [facility]        [facility] (retail)       [address]       [place], [place]  [address]       Ph: [phone] or [phone]       Fax: [phone]   RxID:   [id number] MOBIC 15 MG TABS (MELOXICAM) 1 by mouth once daily with food for knee pain  #30 x 3   Entered and Authorized by:   Judith Part MD   Signed by:   Judith Part MD on 06/25/2010   Method used:   [facility]        [facility] (retail)       [address]       [place], [place]  [address]       Ph: [phone] or [phone]       Fax: [phone]   RxID:   [id number]   Current Allergies (reviewed today): CODEINE * TOPROL BIAXIN AMOXICILLIN * ZEGRID

## 2010-11-06 NOTE — Assessment & Plan Note (Signed)
Summary: COUGH,CONGESTION/CLE   Vital Signs:  Patient profile:   60 year old female Height:      65 inches Weight:      174 pounds BMI:     29.06 Temp:     98 degrees F oral Pulse rate:   80 / minute Pulse rhythm:   regular BP sitting:   94 / 60  (left arm) Cuff size:   regular  Vitals Entered By: Delilah Shan CMA Duncan Dull) (October 19, 2009 8:33 AM) CC: Cough, congestion   History of Present Illness: Persistent drainage and phlegm in throat with cough for over 3 months, has received two rounds of abx, most recently Zpack on 09/13/2009. Trying Zytrec, claritin, humidifier, mucinex, and nasal saline.  Nothing is helping. Dry cough, throat sometimes feels like it is closing shut.  Still having sinus pressure.   Former smoker, qiut years ago.  No fevers, chills, rashes, shortness of breath, or wheezing.  no acid reflux symptoms   Current Medications (verified): 1)  Atenolol 25 Mg Tabs (Atenolol) .... Take One Half By Mouth Daily 2)  Calcium 600  Tabs (Calcium Carbonate Tabs) .... Take Two By Mouth Daily 3)  5 Htp Over The Counter 100 Mg .Marland Kitchen.. 1 By Mouth Once Daily 4)  Mvi .Marland Kitchen.. 1 By Mouth Once Daily 5)  Magnesium ? Dose .Marland Kitchen.. 1 By Mouth Once Daily 6)  Cranberry Tablets .Marland Kitchen.. 1 By Mouth Once Daily 7)  Tylenol Pm .... As Needed Pain/insomnia At Bedtime 8)  Vitamin D 1000 Unit  Tabs (Cholecalciferol) .Marland Kitchen.. 1 Daily By Mouth 9)  Aspirin 81mg  .... 1 By Mouth Once Daily 10)  Acyclovir .... Takes It Twice Weekly 11)  Aleve 220 Mg Tabs (Naproxen Sodium) .... As Needed Pain With Food 12)  Ambien 10 Mg Tabs (Zolpidem Tartrate) .Marland Kitchen.. 1 At Bedtiome As Needed By Mouth 13)  Nexium 40 Mg Cpdr (Esomeprazole Magnesium) .Marland Kitchen.. 1 By Mouth Once Daily  Allergies: 1)  Codeine 2)  * Toprol 3)  Biaxin 4)  Amoxicillin 5)  * Zegrid  Review of Systems      See HPI General:  Denies chills, fatigue, fever, and weight loss. ENT:  Complains of difficulty swallowing. CV:  Denies chest pain or  discomfort. Resp:  Complains of cough; denies shortness of breath, sputum productive, and wheezing.  Physical Exam  General:  Well-developed,well-nourished,in no acute distress; alert,appropriate and cooperative throughout examination Eyes:  vision grossly intact, pupils equal, pupils round, pupils reactive to light, and no injection.   Ears:  R ear normal and L ear normal.   Nose:  nares are congested and injected bilat  Mouth:   some clear post drip noted Lungs:  Normal respiratory effort, chest expands symmetrically. Lungs are clear to auscultation, no crackles or wheezes. Heart:  Normal rate and regular rhythm. S1 and S2 normal without gallop, murmur, click, rub or other extra sounds. Skin:  Intact without suspicious lesions or rashes Psych:  normal affect, talkative and pleasant    Impression & Recommendations:  Problem # 1:  OTHER CHRONIC SINUSITIS (ICD-473.8) Assessment Deteriorated Given duration of symptoms with complaint of "throat closing" will refer to ENT for further evaluation. Likely allergic in nature, will given sample of Astepro and continue other conservative measures such as Mucinex. Pt in agreement with plan. Will call us if symptoms worsen before her ENT appointment.  Orders: ENT Referral (ENT)  Complete Medication List: 1)  Atenolol 25 Mg Tabs (Atenolol) .... Take one half by mouth daily 2)  Calcium 600 Tabs (Calcium carbonate tabs) .... Take two by mouth daily 3)  5 Htp Over The Counter 100 Mg  .Marland KitchenMarland Kitchen. 1 by mouth once daily 4)  Mvi  .Marland Kitchen.. 1 by mouth once daily 5)  Magnesium ? Dose  .Marland KitchenMarland Kitchen. 1 by mouth once daily 6)  Cranberry Tablets  .Marland KitchenMarland Kitchen. 1 by mouth once daily 7)  Tylenol Pm  .... As needed pain/insomnia at bedtime 8)  Vitamin D 1000 Unit Tabs (Cholecalciferol) .Marland Kitchen.. 1 daily by mouth 9)  Aspirin 81mg   .... 1 by mouth once daily 10)  Acyclovir  .... Takes it twice weekly 11)  Aleve 220 Mg Tabs (Naproxen sodium) .... As needed pain with food 12)  Ambien 10 Mg  Tabs (Zolpidem tartrate) .Marland Kitchen.. 1 at bedtiome as needed by mouth 13)  Nexium 40 Mg Cpdr (Esomeprazole magnesium) .Marland Kitchen.. 1 by mouth once daily  Patient Instructions: 1)  Astepro- 1-2 sprays in each notril twice daily. 2)  Please stop by to see Shirlee Limerick on your way out to set up your ENT referral.  Current Allergies (reviewed today): CODEINE * TOPROL BIAXIN AMOXICILLIN * ZEGRID

## 2010-11-06 NOTE — Letter (Signed)
Summary: [facility] at [facility] [address]   [place], [place] [address]   Phone: [phone]  Fax: [phone]      July 10, 2010 MRN: [id number]   [name] [name] [address] [place], [place]  [address]   Dear Ms. Sherrie Mustache,  Your test ordered by Selena Batten has been reviewed by your physician (or physician assistant) and was found to be normal or stable. Your physician (or physician assistant) felt no changes were needed at this time.  ____ Echocardiogram  ___x_ Cardiac Stress Test  ____ Lab Work  ____ Peripheral vascular study of arms, legs or neck  ____ CT scan or X-ray  ____ Lung or Breathing test  ____ Other:   Thank you.   Benedict Needy, RN    Dossie Arbour, MD

## 2010-11-06 NOTE — Assessment & Plan Note (Signed)
Summary: ?UTI/CLE   Vital Signs:  Patient profile:   60 year old female Height:      65 inches Weight:      175.25 pounds BMI:     29.27 Temp:     97.8 degrees F oral Pulse rate:   80 / minute Pulse rhythm:   regular BP sitting:   104 / 64  (left arm) Cuff size:   regular  Vitals Entered By: Lewanda Rife LPN (July 30, 2010 3:12 PM) CC: ?UTI, pain when urinates and urine looks milky   History of Present Illness: is having pain to urinate and urine is cloudy  feels tired and bad in general  drank cranberry and water- did help and came back  some frequency no n/v  no fever  a little abd pain  no back pain  last uti was multi drug resistant - finally tx with keflex and did well   Allergies: 1)  Codeine 2)  * Toprol 3)  Biaxin 4)  Amoxicillin 5)  * Zegrid  Past History:  Past Medical History: Last updated: 06/30/2010 basal cell skin ca 5/08 melanoma  chronic L sided abd/CW pain hyperlipidemia  HSV 2  GERD --HH SVT hx of  chronic fatigue   osteopenia    derm-- Dr Nicholas Lose  cardiol Dr Mariah Milling  Past Surgical History: Last updated: 06/30/2010 MRI neck C4-C5 herniation small stenosis mild C4-5, C5-6  3/00 Gyn surgery, uterine mass (not malignant) 10/00 Hysterectomy melanoma  Dexa- osteopenia 2004 Stress cardiolite neg. EF 47% 4/05 Carotid left- mild recheck 1year 9/05 Colonocopy neg. 3/06 Dexa  (Gyn's office)  Carotid normal 10/06 btl  Nuclear stress test neg. 8/07 EGD normal 9/07 CT abd/pelvis (no contrast) neg., no stones 9/07 abn mole removed from leg- ? basal cell  9/11 hosp for CP and palpatations- ruled out for MI with nl echo  Family History: Last updated: 03/14/2008 father CAD in 63s, ETOH mother uterine ca, HTN, obese  GM DM in older age GF melamoma  P aunt CAD Puncle CAD  Social History: Last updated: 03/14/2008 Patient is a former smoker. - quit 30 years ago no alcohol  married  cleans houses- active job  Risk Factors: Smoking  Status: quit (01/12/2007)  Review of Systems General:  Complains of fatigue; denies chills and fever. Eyes:  Denies blurring and eye irritation. CV:  Denies chest pain or discomfort, palpitations, and shortness of breath with exertion. Resp:  Denies cough, shortness of breath, and wheezing. GI:  Denies abdominal pain, change in bowel habits, and indigestion. GU:  Complains of dysuria and urinary frequency; denies discharge and hematuria. MS:  Denies low back pain. Derm:  Denies itching and rash.  Physical Exam  General:  Well-developed,well-nourished,in no acute distress; alert,appropriate and cooperative throughout examination Head:  normocephalic, atraumatic, and no abnormalities observed.   Mouth:  pharynx pink and moist.   Neck:  No deformities, masses, or tenderness noted. Lungs:  Normal respiratory effort, chest expands symmetrically. Lungs are clear to auscultation, no crackles or wheezes. Heart:  Normal rate and regular rhythm. S1 and S2 normal without gallop, murmur, click, rub or other extra sounds. Abdomen:  Bowel sounds positive,abdomen soft and non-tender without masses, organomegaly or hernias noted. no suprapubic tenderness or fullness felt  Msk:  no CVA tenderness  Skin:  Intact without suspicious lesions or rashes Cervical Nodes:  No lymphadenopathy noted Inguinal Nodes:  No significant adenopathy Psych:  normal affect, talkative and pleasant    Impression & Recommendations:  Problem # 1:  UTI (ICD-599.0) Assessment New  new uti today- uncomplicated last one in May was resistant to multiple drugs so will do cx tx with keflex which worked last time disc water intake and red flags to watch for  pt advised to update me if symptoms worsen or do not improve  Her updated medication list for this problem includes:    Keflex 250 Mg Caps (Cephalexin) .Marland Kitchen... 1 by mouth two times a day for 7 days  Orders: T-Culture, Urine ([id number]) Specimen Handling  ([address]) Prescription Created Electronically ([id number]) UA Dipstick W/ Micro (manual) (91478)  Encouraged to push clear liquids, get enough rest, and take acetaminophen as needed. To be seen in 10 days if no improvement, sooner if worse.  Complete Medication List: 1)  Atenolol 25 Mg Tabs (Atenolol) .... Take one half by mouth every other day 2)  Calcium 600 Tabs (Calcium carbonate tabs) .... Take two by mouth daily 3)  5 Htp Over The Counter 100 Mg  .Marland KitchenMarland Kitchen. 1 by mouth once daily 4)  Mvi  .Marland Kitchen.. 1 by mouth once daily 5)  Cranberry Tablets  .Marland KitchenMarland Kitchen. 1 by mouth once daily 6)  Tylenol Pm  .... As needed pain/insomnia at bedtime 7)  Vitamin D 1000 Unit Tabs (Cholecalciferol) .Marland Kitchen.. 1 daily by mouth 8)  Aspirin 81mg   .... 1 by mouth once daily 9)  Acyclovir  .... Takes it twice weekly as needed 10)  Nexium 40 Mg Cpdr (Esomeprazole magnesium) .Marland Kitchen.. 1 by mouth once daily 11)  Anusol-hc 2.5 % Crea (Hydrocortisone) .... Apply to affected area once daily as needed for hemorroids 12)  Magnesium 250 Mg Tabs (Magnesium) .... One to two tablets by mouth daily 13)  Keflex 250 Mg Caps (Cephalexin) .Marland Kitchen.. 1 by mouth two times a day for 7 days  Patient Instructions: 1)  I sent urine for culture  2)  please talk to the ladies up front about your urine culture on 02/28/10-- to see what happened with bill  3)  continue drinking lots of water 4)  call or seek care is symptoms don't improve in 2-3 days or if you develop back pain, nausea, or vomiting  5)  take the keflex as directed  Prescriptions: KEFLEX 250 MG CAPS (CEPHALEXIN) 1 by mouth two times a day for 7 days  #14 x 0   Entered and Authorized by:   Judith Part MD   Signed by:   Judith Part MD on 07/30/2010   Method used:   [facility]        [id number]       [address]       [place], [place]  [address]       Ph: [phone] or [phone]       Fax: [phone]   RxID:   [id number]    Orders Added: 1)   T-Culture, Urine [[id number]] 2)  Specimen Handling [99[address]] 3)  Prescription Created Electronically [[id number]] 4)  Est. Patient Level III [[address]] 5)  UA Dipstick W/ Micro (manual) [[address]]    Current Allergies (reviewed today): CODEINE * TOPROL BIAXIN AMOXICILLIN * ZEGRID  Laboratory Results   Urine Tests  Date/Time Received: July 30, 2010 3:15 PM  Date/Time Reported: July 30, 2010 3:15 PM   Routine Urinalysis   Color: yellow Appearance: Hazy Glucose: negative   (Normal Range: Negative) Bilirubin: negative   (Normal Range: Negative) Ketone: negative   (Normal Range: Negative) Spec. Gravity: 1.020   (Normal  Range: 1.003-1.035) Blood: small   (Normal Range: Negative) pH: 5.0   (Normal Range: 5.0-8.0) Protein: trace   (Normal Range: Negative) Urobilinogen: 0.2   (Normal Range: 0-1) Nitrite: negative   (Normal Range: Negative) Leukocyte Esterace: small   (Normal Range: Negative)  Urine Microscopic WBC/HPF: many RBC/HPF: few Bacteria/HPF: many Mucous/HPF: few Epithelial/HPF: 1-2 Crystals/HPF: 0 Casts/LPF: 0 Yeast/HPF: 0 Other: 0

## 2010-11-06 NOTE — Progress Notes (Signed)
Summary: pressure in chest  Phone Note Call from Patient   [name]: Patient Call For: Judith Part MD Summary of Call: Pt called complaining of pressure in her chest since early afternoon.  She took some mylanta but that didnt help.  Per Dr. Milinda Antis I advised her to go to ER. Initial call taken by: Lowella Petties CMA,  June 27, 2009 3:28 PM  Follow-up for Phone Call        agree with adv to go to ER pt has hx of ? sVT and palpitaions as well Follow-up by: Judith Part MD,  June 27, 2009 3:46 PM

## 2010-11-06 NOTE — Consult Note (Signed)
Summary: [facility]  [facility]   Imported By: Lanelle Bal 07/06/2010 09:33:41  _____________________________________________________________________  External Attachment:    Type:   Image     Comment:   External Document

## 2010-11-06 NOTE — Miscellaneous (Signed)
Summary: Keflex 250mg  rx  Medications Added KEFLEX 250 MG CAPS (CEPHALEXIN) Take one capsule by mouth two times a day for 7 days       Clinical Lists Changes  Medications: Added new medication of KEFLEX 250 MG CAPS (CEPHALEXIN) Take one capsule by mouth two times a day for 7 days - Signed Rx of KEFLEX 250 MG CAPS (CEPHALEXIN) Take one capsule by mouth two times a day for 7 days;  #14 x 0;  Signed;  Entered by: Lewanda Rife LPN;  Authorized by: Judith Part MD;  Method used: [facility], [place], [place]  [address], Ph: [phone] or [phone], Fax: [phone]    Prescriptions: KEFLEX 250 MG CAPS (CEPHALEXIN) Take one capsule by mouth two times a day for 7 days  #14 x 0   Entered by:   Lewanda Rife LPN   Authorized by:   Judith Part MD   Signed by:   Lewanda Rife LPN on 96/29/5284   Method used:   [facility]        [id number]ail)       [address]       [place], [place]  [address]       Ph: [phone] or [phone]       Fax: [phone]   RxID:   [id number]   Current Allergies: CODEINE * TOPROL BIAXIN AMOXICILLIN * ZEGRID

## 2010-11-06 NOTE — Progress Notes (Signed)
Summary: Treadmill   Phone Note From Other Clinic   [name]: Provider Call For: Gollan Summary of Call: Patient was being seen at [facility] for chest pain. Dana,PA called to schedule a plain treadmill at [facility] for patient next week.  Dr. Antoine Poche is the ordering physician, dx: chest pain.  Patient telephone number is [phone] or [phone] Initial call taken by: [place],  June 29, 2010 1:21 PM  Follow-up for Phone Call        ETT scheduled 07/03/10 @ 8:00am LMOM TCB Benedict Needy, RN  June 29, 2010 3:18 PM   [facility] to call back or ETT will have to be rescheduled. Benedict Needy, RN  July 02, 2010 9:41 AM   Pt has problems with her knees is unable to do ETT per Dr. Norton Blizzard at [facility].  Pt reports having SOB and chest pain at the time of her ER visit. Now she is only having some back pain. Benedict Needy, RN  July 02, 2010 10:49 AM   Per Dr. Shirlee Latch pt changed to [facility]. Pt called and given instructions scheduler will call back and give her an appt.   Follow-up by: Benedict Needy, RN,  July 02, 2010 4:39 PM  New Problems: CHEST PAIN UNSPECIFIED (ICD-786.50)   New Problems: CHEST PAIN UNSPECIFIED (ICD-786.50)

## 2010-11-06 NOTE — Assessment & Plan Note (Signed)
Summary: uti/alc   Vital Signs:  Patient profile:   60 year old female Height:      65 inches Weight:      170.75 pounds BMI:     28.52 Temp:     97.9 degrees F oral Pulse rate:   64 / minute Pulse rhythm:   regular BP sitting:   104 / 64  (left arm) Cuff size:   regular  Vitals Entered By: Lewanda Rife LPN (Feb 27, 2010 4:08 PM) CC: ?UTI, Pt saw GYN 3 weeks treated for UTI with Cipro. Pt got better. Now pain whenurinates and voids small amount frequently. Pain in left lower back   History of Present Illness: had a uti -- 3 weeks ago -- was tx with cipro from gyn   took med - cipro  went for re check -- told it was gone then symptoms came back within 1 week of finishing abx   is having pain in bladder area  hurts to urinate  low back ache- mild  urine is frequent   no fever  a little nauseated   Allergies: 1)  Codeine 2)  * Toprol 3)  Biaxin 4)  Amoxicillin 5)  * Zegrid  Past History:  Past Medical History: Last updated: 07/28/2008 basal cell skin ca 5/08 melanoma  chronic L sided abd/CW pain hyperlipidemia  HSV 2  GERD --HH SVT hx of  chronic fatigue   osteopenia    derm-- Dr Nicholas Lose   Past Surgical History: Last updated: 07/28/2008 MRI neck C4-C5 herniation small stenosis mild C4-5, C5-6  3/00 Gyn surgery, uterine mass (not malignant) 10/00 Hysterectomy melanoma  Dexa- osteopenia 2004 Stress cardiolite neg. EF 47% 4/05 Carotid left- mild recheck 1year 9/05 Colonocopy neg. 3/06 Dexa  (Gyn's office)  Carotid normal 10/06 btl  Nuclear stress test neg. 8/07 EGD normal 9/07 CT abd/pelvis (no contrast) neg., no stones 9/07 abn mole removed from leg- ? basal cell   Family History: Last updated: 03/14/2008 father CAD in 50s, ETOH mother uterine ca, HTN, obese  GM DM in older age GF melamoma  P aunt CAD Puncle CAD  Social History: Last updated: 03/14/2008 Patient is a former smoker. - quit 30 years ago no alcohol  married  cleans houses-  active job  Risk Factors: Smoking Status: quit (01/12/2007)  Review of Systems General:  Complains of fatigue; denies chills, fever, loss of appetite, and malaise. Eyes:  Denies blurring and eye irritation. CV:  Denies chest pain or discomfort and lightheadness. Resp:  Denies coughing up blood and shortness of breath. GI:  Denies diarrhea, nausea, and vomiting. GU:  Complains of dysuria, nocturia, and urinary frequency; denies discharge and hematuria. MS:  Complains of low back pain. Derm:  Denies lesion(s) and rash. Endo:  Denies cold intolerance, excessive thirst, and heat intolerance.  Physical Exam  General:  Well-developed,well-nourished,in no acute distress; alert,appropriate and cooperative throughout examination Head:  normocephalic, atraumatic, and no abnormalities observed.   Neck:  No deformities, masses, or tenderness noted. Lungs:  Normal respiratory effort, chest expands symmetrically. Lungs are clear to auscultation, no crackles or wheezes. Heart:  Normal rate and regular rhythm. S1 and S2 normal without gallop, murmur, click, rub or other extra sounds. Abdomen:  suprapubic tenderness without rebound or gaurding  Msk:  no cva tenderness Skin:  Intact without suspicious lesions or rashes Cervical Nodes:  No lymphadenopathy noted Inguinal Nodes:  No significant adenopathy Psych:  normal affect, talkative and pleasant    Impression & Recommendations:  Problem # 1:  UTI (ICD-599.0) Assessment New ? recurrent with recent tx with cipro urine sent for cx  tx with septra and update  adv to call if worse back pain or worse or fever or not imp in several days Her updated medication list for this problem includes:    Septra Ds 800-160 Mg Tabs (Sulfamethoxazole-trimethoprim) .Marland Kitchen... 1 by mouth two times a day for 7 days  Orders: T-Culture, Urine ([id number]) Specimen Handling (60109) UA Dipstick W/ Micro (manual) (81000)  Complete Medication List: 1)  Atenolol 25 Mg  Tabs (Atenolol) .... Take one half by mouth daily 2)  Calcium 600 Tabs (Calcium carbonate tabs) .... Take two by mouth daily 3)  5 Htp Over The Counter 100 Mg  .Marland KitchenMarland Kitchen. 1 by mouth once daily 4)  Mvi  .Marland Kitchen.. 1 by mouth once daily 5)  Magnesium ? Dose  .Marland KitchenMarland Kitchen. 1 by mouth once daily 6)  Cranberry Tablets  .Marland KitchenMarland Kitchen. 1 by mouth once daily 7)  Tylenol Pm  .... As needed pain/insomnia at bedtime 8)  Vitamin D 1000 Unit Tabs (Cholecalciferol) .Marland Kitchen.. 1 daily by mouth 9)  Aspirin 81mg   .... 1 by mouth once daily 10)  Acyclovir  .... Takes it twice weekly 11)  Aleve 220 Mg Tabs (Naproxen sodium) .... As needed pain with food 12)  Nexium 40 Mg Cpdr (Esomeprazole magnesium) .... 2 by mouth once daily 13)  Septra Ds 800-160 Mg Tabs (Sulfamethoxazole-trimethoprim) .Marland Kitchen.. 1 by mouth two times a day for 7 days  Patient Instructions: 1)  I am sending urine for culture  2)  take the septra ds as directed -- drink lot of water  3)  if you get worse or get increased back pain or fever- please call me asap  4)  I will update you as soon as culture returns  Prescriptions: SEPTRA DS 800-160 MG TABS (SULFAMETHOXAZOLE-TRIMETHOPRIM) 1 by mouth two times a day for 7 days  #14 x 0   Entered and Authorized by:   Judith Part MD   Signed by:   Judith Part MD on 02/27/2010   Method used:   Electronically to        [facility] (retail)       [address]       [place], [place]  [address]       Ph: [phone] or [phone]       Fax: [phone]   RxID:   [id number]   Current Allergies (reviewed today): CODEINE * TOPROL BIAXIN AMOXICILLIN * ZEGRID  Laboratory Results   Urine Tests  Date/Time Received: Feb 27, 2010 4:12 PM  Date/Time Reported: Feb 27, 2010 4:12 PM   Routine Urinalysis   Color: yellow Appearance: Cloudy Glucose: negative   (Normal Range: Negative) Bilirubin: negative   (Normal Range: Negative) Ketone: negative   (Normal Range: Negative) Spec. Gravity: <1.005   (Normal  Range: 1.003-1.035) Blood: small   (Normal Range: Negative) pH: 5.0   (Normal Range: 5.0-8.0) Protein: trace   (Normal Range: Negative) Urobilinogen: 0.2   (Normal Range: 0-1) Nitrite: negative   (Normal Range: Negative) Leukocyte Esterace: small   (Normal Range: Negative)  Urine Microscopic WBC/HPF: 4-8 RBC/HPF: 2-3 Bacteria/HPF: mod Mucous/HPF: few Epithelial/HPF: 102 Crystals/HPF: 0 Casts/LPF: 0 Yeast/HPF: 0 Other: 0

## 2010-11-06 NOTE — Assessment & Plan Note (Signed)
Summary: headache ,chills ,back, cough, throat   Vital Signs:  Patient profile:   60 year old female Height:      65 inches Weight:      169.38 pounds BMI:     28.29 Temp:     98.7 degrees F oral Pulse rate:   76 / minute Pulse rhythm:   regular BP sitting:   84 / 56  (left arm) Cuff size:   regular  Vitals Entered By: Delilah Shan CMA Duncan Dull) (December 26, 2009 8:46 AM) CC: H/A, chills, back, cough, throat.  Patient says her heart has been acting up, pounding.   History of Present Illness: 60 yo here with complaint of URI symptoms but states that her "heart is racing and feels bad." Has a h/o palpations, takes an extra Atenolol when she has them, but did not help today. This started a few days ago and now this morning feels a little tightness in her chest and her back. Also feels a little nauseated.   No new stresses lately other than having a URI for past few days. She did take some OTC medications, like Benadryl.    Last nuclear stress test - was nl in 07 but did not keep her cardiology appt in September.    Pulse 76 today, on Atenolol 12.5 atenolol today.  She is unsure if she has exertional symptoms because she has felt too bad to walk aroudn too much.   She is a little SOB today.     URI symptoms- sore throat, dry cough, nasal congestion x 4 days.  Subjective fever.    Current Medications (verified): 1)  Atenolol 25 Mg Tabs (Atenolol) .... Take One Half By Mouth Daily 2)  Calcium 600  Tabs (Calcium Carbonate Tabs) .... Take Two By Mouth Daily 3)  5 Htp Over The Counter 100 Mg .Marland Kitchen.. 1 By Mouth Once Daily 4)  Mvi .Marland Kitchen.. 1 By Mouth Once Daily 5)  Magnesium ? Dose .Marland Kitchen.. 1 By Mouth Once Daily 6)  Cranberry Tablets .Marland Kitchen.. 1 By Mouth Once Daily 7)  Tylenol Pm .... As Needed Pain/insomnia At Bedtime 8)  Vitamin D 1000 Unit  Tabs (Cholecalciferol) .Marland Kitchen.. 1 Daily By Mouth 9)  Aspirin 81mg  .... 1 By Mouth Once Daily 10)  Acyclovir .... Takes It Twice Weekly 11)  Aleve 220 Mg Tabs  (Naproxen Sodium) .... As Needed Pain With Food 12)  Ambien 10 Mg Tabs (Zolpidem Tartrate) .Marland Kitchen.. 1 At Bedtiome As Needed By Mouth 13)  Nexium 40 Mg Cpdr (Esomeprazole Magnesium) .Marland Kitchen.. 1 By Mouth Once Daily  Allergies: 1)  Codeine 2)  * Toprol 3)  Biaxin 4)  Amoxicillin 5)  * Zegrid  Review of Systems      See HPI General:  Complains of fever and malaise. ENT:  Complains of nasal congestion, sinus pressure, and sore throat. CV:  Complains of chest pain or discomfort and palpitations; denies lightheadness and near fainting. Resp:  Complains of cough and shortness of breath; denies sputum productive and wheezing.  Physical Exam  General:  Well-developed,well-nourished,in no acute distress; alert,appears uncomfortable. BP lower than usual Ears:  R ear normal and L ear normal.   Nose:  nares are congested and injected bilat  Mouth:   some clear post drip noted Lungs:  Normal respiratory effort, chest expands symmetrically. Lungs are clear to auscultation, no crackles or wheezes. Heart:  Normal rate and regular rhythm. S1 and S2 normal without gallop, murmur, click, rub or other extra sounds. Extremities:  No clubbing, cyanosis, edema, or deformity noted with normal full range of motion of all joints.   Psych:  normal affect, talkative and pleasant    Impression & Recommendations:  Problem # 1:  CHEST PAIN (ICD-786.50) Assessment New EKG reviewed- some minor changes from prior. Given her appearance, along with her worsening symptoms and years since last cardiac work up, will refer to cardiology.  Problem # 2:  URI (ICD-465.9) Assessment: New LIkely viral.  Continue supportive care.  RTC if no improvement in 5-7 days. Her updated medication list for this problem includes:    Aleve 220 Mg Tabs (Naproxen sodium) .Marland Kitchen... As needed pain with food  Complete Medication List: 1)  Atenolol 25 Mg Tabs (Atenolol) .... Take one half by mouth daily 2)  Calcium 600 Tabs (Calcium carbonate tabs)  .... Take two by mouth daily 3)  5 Htp Over The Counter 100 Mg  .Marland KitchenMarland Kitchen. 1 by mouth once daily 4)  Mvi  .Marland Kitchen.. 1 by mouth once daily 5)  Magnesium ? Dose  .Marland KitchenMarland Kitchen. 1 by mouth once daily 6)  Cranberry Tablets  .Marland KitchenMarland Kitchen. 1 by mouth once daily 7)  Tylenol Pm  .... As needed pain/insomnia at bedtime 8)  Vitamin D 1000 Unit Tabs (Cholecalciferol) .Marland Kitchen.. 1 daily by mouth 9)  Aspirin 81mg   .... 1 by mouth once daily 10)  Acyclovir  .... Takes it twice weekly 11)  Aleve 220 Mg Tabs (Naproxen sodium) .... As needed pain with food 12)  Ambien 10 Mg Tabs (Zolpidem tartrate) .Marland Kitchen.. 1 at bedtiome as needed by mouth 13)  Nexium 40 Mg Cpdr (Esomeprazole magnesium) .Marland Kitchen.. 1 by mouth once daily  Other Orders: EKG w/ Interpretation (93000) Cardiology Referral (Cardiology)  Patient Instructions: 1)  Please stop by to see Shirlee Limerick on your way out to set up your cardiology appointment.  Current Allergies (reviewed today): CODEINE * TOPROL BIAXIN AMOXICILLIN * ZEGRID

## 2010-11-06 NOTE — Assessment & Plan Note (Signed)
Summary: Cardiology Nuclear Testing  Nuclear Med Background Indications for Stress Test: Evaluation for Ischemia, Post Hospital  Indications Comments: 06/29/10 CP, Neg. enzymes  History: Echo, Myocardial Perfusion Study  History Comments: 9/11 Echo- Nml, 05,07 MPS EF 47%- no ischemia hx short run SVT  Symptoms: Diaphoresis, Palpitations    Nuclear Pre-Procedure Cardiac Risk Factors: Family History - CAD, History of Smoking, Hypertension Caffeine/Decaff Intake: None NPO After: 8:00 PM Lungs: clear IV 0.9% NS with Angio Cath: 22g     IV Site: R Antecubital IV Started by: Irean Hong, RN Chest Size (in) 36     Cup Size B     Height (in): 65 Weight (lb): 173 BMI: 28.89 Tech Comments: Last atenolol 2 PM yesterday.  Nuclear Med Study 1 or 2 day study:  1 day     Stress Test Type:  Treadmill/Lexiscan Reading MD:  Kristeen Miss, MD     Referring MD:  T.Gollan Resting Radionuclide:  Technetium 65m Tetrofosmin     Resting Radionuclide Dose:  10.9 mCi  Stress Radionuclide:  Technetium 7m Tetrofosmin     Stress Radionuclide Dose:  33 mCi   Stress Protocol   Lexiscan: 0.4 mg   Stress Test Technologist:  Milana Na, EMT-P     Nuclear Technologist:  Domenic Polite, CNMT  Rest Procedure  Myocardial perfusion imaging was performed at rest 45 minutes following the intravenous administration of Technetium 45m Tetrofosmin.  Stress Procedure  The patient received IV Lexiscan 0.4 mg over 15-seconds with concurrent low level exercise and then Technetium 75m Tetrofosmin was injected at 30-seconds while the patient continued walking one more minute.  There were no significant changes with Lexiscan.  Quantitative spect images were obtained after a 45 minute delay.  QPS Raw Data Images:  Normal; no motion artifact; normal heart/lung ratio. Stress Images:  Normal homogeneous uptake in all areas of the myocardium. Rest Images:  Normal homogeneous uptake in all areas of the  myocardium. Subtraction (SDS):  No evidence of ischemia. Transient Ischemic Dilatation:  .97  (Normal <1.22)  Lung/Heart Ratio:  .26  (Normal <0.45)  Quantitative Gated Spect Images QGS EDV:  89 ml QGS ESV:  42 ml QGS EF:  53 % QGS cine images:  Normal LV systolic function.  Findings Normal nuclear study      Overall Impression  Exercise Capacity: Lexiscan with no exercise. BP Response: Normal blood pressure response. Clinical Symptoms: mild dizzyness ECG Impression: No significant ST segment change suggestive of ischemia. Overall Impression: Normal stress nuclear study. Overall Impression Comments: Normal stress nuclear study  Appended Document: Cardiology Nuclear Testing stress test is normal. No further testing is needed.   Appended Document: Cardiology Nuclear Testing letter mailed to pt  Appended Document: Cardiology Nuclear Testing pt called wanted stress test results. results given.

## 2010-11-06 NOTE — Progress Notes (Signed)
Summary: PHI  PHI   Imported By: Harlon Flor 12/27/2009 09:41:29  _____________________________________________________________________  External Attachment:    Type:   Image     Comment:   External Document

## 2010-11-06 NOTE — Progress Notes (Signed)
Summary: Nuc Pre-Procedure  Phone Note Outgoing Call Call back at [facility] Phone [phone]   Call placed by: Antionette Char RN,  July 05, 2010 4:09 PM Call placed to: Patient Reason for Call: Confirm/change Appt Summary of Call: Reviewed information on [employer] Information Sheet (see scanned document for further details).  Spoke with patient.     Nuclear Med Background Indications for Stress Test: Evaluation for Ischemia, Post Hospital  Indications Comments: 06/29/10 CP, Neg. enzymes  History: Echo, Myocardial Perfusion Study  History Comments: 9/11 Echo- Nml, 05,07 MPS EF 47%- no ischemia hx short run SVT  Symptoms: Diaphoresis, Palpitations    Nuclear Pre-Procedure Cardiac Risk Factors: Family History - CAD, History of Smoking, Hypertension Height (in): 65

## 2010-11-06 NOTE — Assessment & Plan Note (Signed)
Summary: NP6/AMD  Medications Added VITAMIN D 1000 UNIT  TABS (CHOLECALCIFEROL) 2 DAILY BY MOUTH NEXIUM 40 MG CPDR (ESOMEPRAZOLE MAGNESIUM) 2 by mouth once daily      Allergies Added:   Visit Type:  New Patient Referring Provider:  Ruthe Mannan Primary Provider:  Judith Part MD  CC:  chest pains, little sob, irregular heart beat, running fever, took atenolol feels better, throats hurts so bad.. heart started skipping since yesterday. took sudafed sunday. right ankle stays swollen a lot, and more than other.  hx of paliptations especially with caffine. Marland Kitchen  History of Present Illness: [name] [name] is a 60 year old woman with history of palpitations, atypical chest pain, chronic fatigue, SVT documented in May 2005 per the notes, who presents for evaluation of recent episodes of palpitations.  She states that over the past week, she has had a severe cold. She has had malaise, headaches and more recently, sore throat and fever. She started taking Sudafed and Mucinex. She wonders if the Sudafed and Mucinex has caused some heart issues. Last night she could not sleep due to palpitations and a fast heart rate. She took a half dose atenolol this morning and feels better part for her sore throat and her other symptoms.  Her husband had a viral infection prior to her with similar symptoms. She states having palpitations frequently she takes too much caffeine. Occasionally she does have extra beats and she takes the atenolol on a daily basis. She denies any chest pain at baseline so has noticed it is sometimes uncomfortable to breathe  since she's had her cold.  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 Take One Half By Mouth Daily 2)  Calcium 600  Tabs (Calcium Carbonate Tabs) .... Take Two By Mouth Daily 3)  5 Htp Over The Counter 100 Mg .Marland Kitchen.. 1 By Mouth Once Daily 4)  Mvi .Marland Kitchen.. 1 By Mouth Once Daily 5)  Magnesium ? Dose .Marland Kitchen.. 1 By Mouth Once Daily 6)  Cranberry Tablets .Marland Kitchen.. 1 By Mouth Once Daily 7)  Tylenol Pm .... As Needed Pain/insomnia At Bedtime 8)  Vitamin D 1000 Unit  Tabs (Cholecalciferol) .... 2 Daily By Mouth 9)  Aspirin 81mg  .... 1 By Mouth Once Daily 10)  Acyclovir .... Takes It Twice Weekly 11)  Aleve 220 Mg Tabs (Naproxen Sodium) .... As Needed Pain With Food 12)  Nexium 40 Mg Cpdr (Esomeprazole Magnesium) .... 2 By Mouth Once Daily  Allergies (verified): 1)  Codeine 2)  * Toprol 3)  Biaxin 4)  Amoxicillin 5)  * Zegrid  Past History:  Past  Medical History: Last updated: 07/28/2008 basal cell skin ca 5/08 melanoma  chronic L sided abd/CW pain hyperlipidemia  HSV 2  GERD --HH SVT hx of  chronic fatigue   osteopenia    derm-- Dr Nicholas Lose   Past Surgical History: Last updated: 07/28/2008 MRI neck C4-C5 herniation small stenosis mild C4-5, C5-6  3/00 Gyn surgery, uterine mass (not malignant) 10/00 Hysterectomy melanoma  Dexa- osteopenia 2004 Stress cardiolite neg. EF 47% 4/05 Carotid left- mild recheck 1year 9/05 Colonocopy neg.  3/06 Dexa  (Gyn's office)  Carotid normal 10/06 btl  Nuclear stress test neg. 8/07 EGD normal 9/07 CT abd/pelvis (no contrast) neg., no stones 9/07 abn mole removed from leg- ? basal cell   Family History: Last updated: 03/14/2008 father CAD in 41s, ETOH mother uterine ca, HTN, obese  GM DM in older age GF melamoma  P aunt CAD Puncle CAD  Social History: Last updated: 03/14/2008 Patient is a former smoker. - quit 30 years ago no alcohol  married  cleans houses- active job  Risk Factors: Smoking Status: quit (01/12/2007)  Review of Systems       The patient complains of anorexia, chest pain, dyspnea on exertion, prolonged cough, and muscle weakness.  The patient denies fatigue, malaise, fever, weight gain/loss, vision loss, decreased hearing, hoarseness, palpitations, shortness of breath, wheezing, sleep apnea, coughing up blood, abdominal pain, blood in stool, nausea, vomiting, diarrhea, heartburn, incontinence, blood in urine, joint pain, leg swelling, rash, skin lesions, headache, fainting, dizziness, depression, anxiety, enlarged lymph nodes, easy bruising or bleeding, and environmental allergies.         malaise  Vital Signs:  Patient profile:   60 year old female Height:      65 inches Weight:      170 pounds BMI:     28.39 Pulse rate:   71 / minute Pulse rhythm:   regular BP sitting:   101 / 79  (left arm) Cuff size:   regular  Vitals Entered By: Mercer Pod (December 26, 2009 10:39 AM)  Physical Exam  General:  middle-age woman in no apparent distress, HEENT exam is benign, neck is supple with no JVP or carotid bruits, heart sounds are regular with S1-S2 no murmurs appreciated, lungs are clear to auscultation with no wheezes rales, abdominal exam is benign, no significant lower extremity edema, neurologic exam is nonfocal skin is warm and dry. Pulses are equal and symmetrical in her upper and lower extremities.   EKG  Procedure date:   12/26/2009  Findings:      EKG shows normal sinus rhythm with a rate of 80 beats per minute. This was done her primary care physician's office. Poor R wave progression through the precordial leads.  Impression & Recommendations:  Problem # 1:  PALPITATIONS (ICD-785.1) Ms. Morgan has a viral URI. I suspect that her palpitations and tachycardia is coming from taking the over-the-counter Mucinex and Sudafed. This is a common complaint. Her symptoms should resolve if she refrains from these medications. I suggested that she stay with Tylenol and Advil or ibuprofen for her fever and malaise. She try Benadryl for sleep. If she continues to have tachypalpitations after a viral illness has improved, and asked her to contact us. From her history, it sounds if she does well on a low-dose atenolol.  Her updated medication list for this problem includes:    Atenolol 25 Mg Tabs (Atenolol) .Marland Kitchen... Take one half by mouth daily  Problem # 2:  ABNORMAL EKG (ICD-794.31) I do  not believe that the reading on the EKG from today was accurate that suggested an old anterior MI. This may fact be due to lead placement. No further workup is needed at this time.  We did discuss her cholesterol levels with her and given her current numbers, I encouraged diet and exercise rather than starting any medications for cholesterol management.  Her updated medication list for this problem includes:    Atenolol 25 Mg Tabs (Atenolol) .Marland Kitchen... Take one half by mouth daily

## 2010-11-06 NOTE — Assessment & Plan Note (Signed)
Summary: URINE SPEC/DLO  Pt is here for repeat u/a. Pt said she is much better and not having any symptoms of burning or pain upon urination and is not going frequently. [facility] uses [facility] if pharmacy is needed. Pt can be reached at [phone] Nurse Visit   Allergies: 1)  Codeine 2)  * Toprol 3)  Biaxin 4)  Amoxicillin 5)  * Zegrid Laboratory Results   Urine Tests  Date/Time Received: March 14, 2010 11:06 AM  Date/Time Reported: March 14, 2010 11:06 AM   Routine Urinalysis   Color: yellow Appearance: Clear Glucose: negative   (Normal Range: Negative) Bilirubin: negative   (Normal Range: Negative) Ketone: negative   (Normal Range: Negative) Spec. Gravity: 1.025   (Normal Range: 1.003-1.035) Blood: trace-intact   (Normal Range: Negative) pH: 5.0   (Normal Range: 5.0-8.0) Protein: trace   (Normal Range: Negative) Urobilinogen: 0.2   (Normal Range: 0-1) Nitrite: negative   (Normal Range: Negative) Leukocyte Esterace: negative   (Normal Range: Negative)  Urine Microscopic WBC/HPF: 0-1 RBC/HPF: 0 Bacteria/HPF: 0 Mucous/HPF: few Epithelial/HPF: 1-2 Crystals/HPF: 0 Casts/LPF: 0 Yeast/HPF: 0 Other: 0    Comments: let her know urine looks clear -- if symptoms return , let me know please     Orders Added: 1)  Est. Patient Level I [16109] 2)  UA Dipstick W/ Micro (manual) [81000]  Patient Advised. Lugene Fuquay CMA Duncan Dull)  March 14, 2010 2:49 PM

## 2010-11-06 NOTE — Progress Notes (Signed)
----   Converted from flag ---- ---- 08/21/2010 11:12 PM, Colon Flattery Tower MD wrote: please check lipid/wellness/ vit d for v70.0 and 733.0 and vit D def thanks  ---- 08/21/2010 10:47 AM, Liane Comber CMA (AAMA) wrote: Lab orders please! Good Morning! This pt is scheduled for cpx labs Tuesday, which labs to draw and dx codes to use? Thanks Tasha ------------------------------

## 2010-11-08 NOTE — Assessment & Plan Note (Signed)
Summary: FOLLOW UP FROM UTI SYMPTOMS- NOT ANY BETTER   Vital Signs:  Patient profile:   60 year old female Height:      65 inches Weight:      179.25 pounds BMI:     29.94 Temp:     98.1 degrees F oral Pulse rate:   64 / minute Pulse rhythm:   regular BP sitting:   98 / 60  (left arm) Cuff size:   large  Vitals Entered By: Lewanda Rife LPN (October 29, 2010 8:13 AM) CC: Lower abdominal sharp pain on and off with cramping more than usual with BM. constipation on and off. Comments Pt received a call Fri to stop Cipro Urine culture neg.   History of Present Illness: has been seen for ecoli uti and tx with cipro   2nd ucx came back clear   now c/o low abd sharp pain/ craming and bowel changes  is more on the R than the L and right at the bottom hurts and sometimes in rectum  some sharp pains to have bm -- and sometimes cramps instead of bm no diarrhea or blood in stool  overall improved but still there  no mucous  sat - nl bm sun -- smaller bm  at times constipated   some chills/ no fever   no vomiting - but had a little nausea  was exp to stomach virus some loss of appetite    felt a little better with cipro  no diverticulosis known  still has ovaries       Allergies: 1)  Codeine 2)  * Toprol 3)  Biaxin 4)  Amoxicillin 5)  * Zegrid  Past History:  Past Medical History: Last updated: 09/14/2010 basal cell skin ca 5/08 melanoma  chronic L sided abd/CW pain hyperlipidemia  HSV 2  GERD --HH SVT hx of  chronic fatigue   osteopenia    derm-- Dr Nicholas Lose  cardiol Dr Mariah Milling [facility]yn [facility]  Past Surgical History: Last updated: 06/30/2010 MRI neck C4-C5 herniation small stenosis mild C4-5, C5-6  3/00 Gyn surgery, uterine mass (not malignant) 10/00 Hysterectomy melanoma  Dexa- osteopenia 2004 Stress cardiolite neg. EF 47% 4/05 Carotid left- mild recheck 1year 9/05 Colonocopy neg. 3/06 Dexa  (Gyn's office)  Carotid normal 10/06 btl  Nuclear stress  test neg. 8/07 EGD normal 9/07 CT abd/pelvis (no contrast) neg., no stones 9/07 abn mole removed from leg- ? basal cell  9/11 hosp for CP and palpatations- ruled out for MI with nl echo  Family History: Last updated: 09/14/2010 father CAD in 72s, ETOH mother uterine ca, HTN, obese , CLL and throbocytopenia  GM DM in older age GF melamoma  P aunt CAD Puncle CAD  Social History: Last updated: 03/14/2008 Patient is a former smoker. - quit 30 years ago no alcohol  married  cleans houses- active job  Risk Factors: Smoking Status: quit (01/12/2007)  Review of Systems General:  Complains of chills, fatigue, and loss of appetite; denies fever. ENT:  Denies sore throat. CV:  Denies chest pain or discomfort and palpitations. Resp:  Denies cough and shortness of breath. GI:  Complains of abdominal pain, change in bowel habits, constipation, and nausea; denies bloody stools, dark tarry stools, diarrhea, gas, indigestion, vomiting, and vomiting blood. GU:  Denies dysuria, hematuria, and urinary frequency. MS:  Denies low back pain and mid back pain. Derm:  Denies itching, lesion(s), poor wound healing, and rash. Neuro:  Denies numbness and tingling. Endo:  Denies cold intolerance,  excessive thirst, excessive urination, and heat intolerance. Heme:  Denies abnormal bruising and bleeding.  Physical Exam  General:  overweight but generally well appearing  VSS, non toxic appearing Head:  normocephalic, atraumatic, and no abnormalities observed.   Eyes:  vision grossly intact, pupils equal, pupils round, and pupils reactive to light.  no conjunctival pallor, injection or icterus  Mouth:  pharynx pink and moist.   Neck:  supple with full rom and no masses or thyromegally, no JVD or carotid bruit  Chest Wall:  No deformities, masses, or tenderness noted. Lungs:  Normal respiratory effort, chest expands symmetrically. Lungs are clear to auscultation, no crackles or wheezes. Heart:  Normal  rate and regular rhythm. S1 and S2 normal without gallop, murmur, click, rub or other extra sounds. Abdomen:  tender suprapubic and in RLQ slightly to deep palp no rebound or gaurding  nl bs in 4 Q no HSM  some mild flank pain on R  no CVA tenderness Rectal:  No external abnormalities noted. Normal sphincter tone. No rectal masses or tenderness. heme neg stool Msk:  no CVA tenderness  no LS tenderness  Extremities:  No clubbing, cyanosis, edema, or deformity noted with normal full range of motion of all joints.   Neurologic:  sensation intact to light touch, gait normal, and DTRs symmetrical and normal.   Skin:  Intact without suspicious lesions or rashes Cervical Nodes:  No lymphadenopathy noted Inguinal Nodes:  No significant adenopathy Psych:  normal affect, talkative and pleasant    Impression & Recommendations:  Problem # 1:  ABDOMINAL PAIN, LOWER (ICD-789.09) Assessment New s/p uti - with 2nd cx clear no hx of diverticulosis  x ray today- KUB ua clear micro  labs - pending  will update with results  adv to call/ seek care if acutely worse Orders: Venipuncture (11914) TLB-BMP (Basic Metabolic Panel-BMET) (80048-METABOL) TLB-CBC Platelet - w/Differential (85025-CBCD) TLB-Hepatic/Liver Function Pnl (80076-HEPATIC) T-1 View Abdomen (KUB) (74000TC)  Complete Medication List: 1)  Atenolol 25 Mg Tabs (Atenolol) .... Take one half by mouth every day 2)  Calcium 600 Tabs (Calcium carbonate tabs) .... Take two by mouth daily 3)  5 Htp Over The Counter 100 Mg  .Marland KitchenMarland Kitchen. 1 by mouth once daily 4)  Mvi  .Marland Kitchen.. 1 by mouth once daily 5)  Cranberry Tablets  .Marland KitchenMarland Kitchen. 1 by mouth once daily 6)  Tylenol Pm  .... As needed pain/insomnia at bedtime 7)  Vitamin D 1000 Unit Tabs (Cholecalciferol) .Marland Kitchen.. 1 daily by mouth 8)  Aspirin 81mg   .... 1 by mouth once daily 9)  Acyclovir  .... Takes it twice weekly as needed 10)  Anusol-hc 2.5 % Crea (Hydrocortisone) .... Apply to affected area once daily as  needed for hemorroids 11)  Magnesium 250 Mg Tabs (Magnesium) .... One to two tablets by mouth daily  Other Orders: UA Dipstick W/ Micro (manual) (78295)  Patient Instructions: 1)  x ray and urinalysis and labs today-- I will update you asap  2)  if worse please call    Orders Added: 1)  Venipuncture [36415] 2)  TLB-BMP (Basic Metabolic Panel-BMET) [80048-METABOL] 3)  TLB-CBC Platelet - w/Differential [85025-CBCD] 4)  TLB-Hepatic/Liver Function Pnl [80076-HEPATIC] 5)  UA Dipstick W/ Micro (manual) [81000] 6)  T-1 View Abdomen (KUB) [74000TC] 7)  Est. Patient Level IV [62130]    Current Allergies (reviewed today): CODEINE * TOPROL BIAXIN AMOXICILLIN * ZEGRID  Laboratory Results   Urine Tests  Date/Time Received: October 29, 2010 10:34 AM  Date/Time Reported:  October 29, 2010 10:34 AM   Routine Urinalysis   Color: yellow Appearance: slightly hazy Glucose: negative   (Normal Range: Negative) Bilirubin: negative   (Normal Range: Negative) Ketone: negative   (Normal Range: Negative) Spec. Gravity: 1.015   (Normal Range: 1.003-1.035) Blood: small   (Normal Range: Negative) pH: 6.0   (Normal Range: 5.0-8.0) Protein: negative   (Normal Range: Negative) Urobilinogen: 0.2   (Normal Range: 0-1) Nitrite: negative   (Normal Range: Negative) Leukocyte Esterace: negative   (Normal Range: Negative)  Urine Microscopic WBC/HPF: 0-1 RBC/HPF: 0 Bacteria/HPF: few Mucous/HPF: few Epithelial/HPF: 0-1 Crystals/HPF: 0 Casts/LPF: 0 Yeast/HPF: 0 Other: 0

## 2010-11-08 NOTE — Assessment & Plan Note (Signed)
Summary: cpx/alc   Vital Signs:  Patient profile:   60 year old female Height:      65 inches Weight:      176.50 pounds BMI:     29.48 Temp:     98 degrees F oral Pulse rate:   72 / minute Pulse rhythm:   regular BP sitting:   110 / 68  (left arm) Cuff size:   regular  Vitals Entered By: Lewanda Rife LPN (September 14, 2010 10:39 AM) CC: CPX GYN does pap and breast exam. Today pt has h/a, neck pain and feels like lump in throat with irritation of throat.   History of Present Illness: here for health mt exam and also to rev chronic med problems   extreme stress   something going up in her throat  stopped taking her nexium -- and tried aloe vera (nexium does not work)  now throat feels scratchy and neck hurts - feels lousy in general  some sneezing  she wants a referral to ent before jan   wt is up 1 lb  bp good 110/68  derm visit -- was last march  has appt next march nothing new   lipids are up a little LDL 120s from 111 with HDL 48  osteopenia followed by gyn with exam  11/11 was the same -- not bad enough to start med  vit D level is 39   mam 9/11 - normal  self exam - no lumps or changes   colonosc 3/06-- due 10 years   Td 07 flu shot   much stress mother has CLL and low platelets  she is in the hospital constantly not able to watch her diet and also not able to exercise    gyn in march- pap was good    Preventive Care Screening  Bone Density:    Date:  08/07/2010    Results:  osteopenia std dev  Mammogram:    Date:  06/07/2010    Results:  normal   Pap Smear:    Date:  12/05/2009    Results:  normal    Allergies: 1)  Codeine 2)  * Toprol 3)  Biaxin 4)  Amoxicillin 5)  * Zegrid  Past History:  Past Surgical History: Last updated: 06/30/2010 MRI neck C4-C5 herniation small stenosis mild C4-5, C5-6  3/00 Gyn surgery, uterine mass (not malignant) 10/00 Hysterectomy melanoma  Dexa- osteopenia 2004 Stress cardiolite neg. EF 47%  4/05 Carotid left- mild recheck 1year 9/05 Colonocopy neg. 3/06 Dexa  (Gyn's office)  Carotid normal 10/06 btl  Nuclear stress test neg. 8/07 EGD normal 9/07 CT abd/pelvis (no contrast) neg., no stones 9/07 abn mole removed from leg- ? basal cell  9/11 hosp for CP and palpatations- ruled out for MI with nl echo  Family History: Last updated: 09/14/2010 father CAD in 18s, ETOH mother uterine ca, HTN, obese , CLL and throbocytopenia  GM DM in older age GF melamoma  P aunt CAD Puncle CAD  Social History: Last updated: 03/14/2008 Patient is a former smoker. - quit 30 years ago no alcohol  married  cleans houses- active job  Risk Factors: Smoking Status: quit (01/12/2007)  Past Medical History: basal cell skin ca 5/08 melanoma  chronic L sided abd/CW pain hyperlipidemia  HSV 2  GERD --HH SVT hx of  chronic fatigue   osteopenia    derm-- Dr Nicholas Lose  cardiol Dr Mariah Milling gyn [facility]  Family History: father CAD in 75s, ETOH mother uterine  ca, HTN, obese , CLL and throbocytopenia  GM DM in older age GF melamoma  P aunt CAD Puncle CAD  Review of Systems General:  Complains of fatigue and malaise; denies chills, fever, and loss of appetite. Eyes:  Denies blurring and eye irritation. ENT:  Complains of sore throat; denies hoarseness and nasal congestion. CV:  Denies chest pain or discomfort, lightheadness, palpitations, and shortness of breath with exertion. Resp:  Denies cough, shortness of breath, and wheezing. GI:  Denies abdominal pain, bloody stools, change in bowel habits, and loss of appetite. GU:  Denies dysuria and urinary frequency. MS:  Denies joint pain, joint redness, and joint swelling. Derm:  Denies itching, lesion(s), poor wound healing, and rash. Neuro:  Denies numbness and tingling. Psych:  mood is fair - is easily frustrated . Endo:  Denies cold intolerance, excessive thirst, excessive urination, and heat intolerance. Heme:  Denies abnormal  bruising and bleeding.  Physical Exam  General:  overweight but generally well appearing  Head:  normocephalic, atraumatic, and no abnormalities observed.   Eyes:  vision grossly intact, pupils equal, pupils round, and pupils reactive to light.  no conjunctival pallor, injection or icterus  Ears:  R ear normal and L ear normal.   Nose:  no nasal discharge.   Mouth:  pharynx pink and moist.   some mild post nasal drip  no swelling or lesions noed  Neck:  supple with full rom and no masses or thyromegally, no JVD or carotid bruit  Chest Wall:  No deformities, masses, or tenderness noted. Lungs:  Normal respiratory effort, chest expands symmetrically. Lungs are clear to auscultation, no crackles or wheezes. Heart:  Normal rate and regular rhythm. S1 and S2 normal without gallop, murmur, click, rub or other extra sounds. Abdomen:  Bowel sounds positive,abdomen soft and non-tender without masses, organomegaly or hernias noted. no suprapubic tenderness or fullness felt  Msk:  No deformity or scoliosis noted of thoracic or lumbar spine.  no new joint changes  Pulses:  R and L carotid,radial,femoral,dorsalis pedis and posterior tibial pulses are full and equal bilaterally Extremities:  No clubbing, cyanosis, edema, or deformity noted with normal full range of motion of all joints.   Neurologic:  sensation intact to light touch, gait normal, and DTRs symmetrical and normal.   Skin:  Intact without suspicious lesions or rashes Cervical Nodes:  No lymphadenopathy noted Inguinal Nodes:  No significant adenopathy Psych:  seems frustrated and fatigued today- but overall pleasant    Impression & Recommendations:  Problem # 1:  HEALTH MAINTENANCE EXAM (ICD-V70.0) Assessment Comment Only reviewed health habits including diet, exercise and skin cancer prevention reviewed health maintenance list and family history labs rev in detail  will work on low chol diet  pt decline flu shot until she checks  ins cov  Problem # 2:  THROAT PAIN, CHRONIC (ICD-784.1) Assessment: New ref to ENT ? reflux or other nexium did not help also some general neck pain Orders: ENT Referral (ENT)  Problem # 3:  UNSPECIFIED VITAMIN D DEFICIENCY (ICD-268.9) Assessment: Unchanged increase D to 2000 international units per day rev labs with pt  disc im to bone health   Problem # 4:  HYPERCHOLESTEROLEMIA, PURE (ICD-272.0) Assessment: Deteriorated  up a bit with poor diet  will work on low sat fat diet when she can  rev lab in detail  Labs Reviewed: SGOT: 31 (09/12/2010)   SGPT: 29 (09/12/2010)   HDL:48.00 (09/12/2010), 49.30 (04/21/2009)  LDL:111 (04/21/2009), 91 (11/91/4782)  Chol:205 (09/12/2010), 182 (04/21/2009)  Trig:119.0 (09/12/2010), 110.0 (04/21/2009)  Problem # 5:  OSTEOPENIA (ICD-733.90) Assessment: Unchanged followed by gyn recent stable dexa disc inc vit D and exercise Her updated medication list for this problem includes:    Calcium 600 Tabs (Calcium carbonate tabs) .Marland Kitchen... Take two by mouth daily    Vitamin D 1000 Unit Tabs (Cholecalciferol) .Marland Kitchen... 1 daily by mouth  Problem # 6:  STRESS REACTION, ACUTE, WITH EMOTIONAL DISTURBANCE (ICD-308.0) Assessment: New disc stress of caring for mother coping tech are fair  can call for counseling ref any time  Complete Medication List: 1)  Atenolol 25 Mg Tabs (Atenolol) .... Take one half by mouth every day 2)  Calcium 600 Tabs (Calcium carbonate tabs) .... Take two by mouth daily 3)  5 Htp Over The Counter 100 Mg  .Marland KitchenMarland Kitchen. 1 by mouth once daily 4)  Mvi  .Marland Kitchen.. 1 by mouth once daily 5)  Cranberry Tablets  .Marland KitchenMarland Kitchen. 1 by mouth once daily 6)  Tylenol Pm  .... As needed pain/insomnia at bedtime 7)  Vitamin D 1000 Unit Tabs (Cholecalciferol) .Marland Kitchen.. 1 daily by mouth 8)  Aspirin 81mg   .... 1 by mouth once daily 9)  Acyclovir  .... Takes it twice weekly as needed 10)  Anusol-hc 2.5 % Crea (Hydrocortisone) .... Apply to affected area once daily as needed for  hemorroids 11)  Magnesium 250 Mg Tabs (Magnesium) .... One to two tablets by mouth daily  Patient Instructions: 1)  increase your vitamin D to 2000 international units per day  2)  you need a flu shot ! - this is really important  3)  please call us back to schedule  4)  no other changes in medicine 5)  try to eat healthy and work on exercise when your can  6)  we will do ENT referral when you check out Prescriptions: ATENOLOL 25 MG TABS (ATENOLOL) take one half by mouth every day  #15 x 11   Entered and Authorized by:   Judith Part MD   Signed by:   Judith Part MD on 09/14/2010   Method used:   Electronically to        [id number] (retail)       [address]       [place], [place]  [id number]       Ph: [phone]       Fax: [phone]   RxID:   [id number]    Orders Added: 1)  ENT Referral [ENT] 2)  Est. Patient 40-64 years [99396] 3)  Est. Patient Level II [24401]    Current Allergies (reviewed today): CODEINE * TOPROL BIAXIN AMOXICILLIN * ZEGRID  Preventive Care Screening  Bone Density:    Date:  08/07/2010    Results:  osteopenia std dev  Mammogram:    Date:  06/07/2010    Results:  normal   Pap Smear:    Date:  12/05/2009    Results:  normal

## 2010-11-08 NOTE — Assessment & Plan Note (Signed)
Summary: uti/alc   Vital Signs:  Patient profile:   60 year old female Weight:      176.75 pounds Temp:     97.8 degrees F oral Pulse rate:   76 / minute Pulse rhythm:   regular BP sitting:   104 / 70  (left arm) Cuff size:   large  Vitals Entered By: Sydell Axon LPN (October 23, 2010 11:59 AM) CC: ? UTI, stomach hurts, urine is dark   History of Present Illness: Pt seen here for UTI in May and Oct with multip resistance until the last culture done two weeks ago with  what  looked like garden variety E. Coli on culture.  She had sxs with going to [place] 2 weeks ago which got better on 250mg  Keflex two times a day. She now has abd pain which started Sat nite and progressed since. It hurts to cough, sneeze, walk and also has usual burning with urination. BMs are nml for her but hurts with that as well. She also had sharp pain with the feeling of needing to have a BM, she also feels tight and swollen. She has had chills last night, no known fever. She is drinking fluids but it does not sound like much. Her urine has been cloudy last night.   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 Take One Half By Mouth Every Day 2)  Calcium 600  Tabs (Calcium Carbonate Tabs) .... Take Two By Mouth Daily 3)  5 Htp Over The Counter 100 Mg .Marland Kitchen.. 1 By Mouth Once Daily 4)  Mvi .Marland Kitchen.. 1 By Mouth Once Daily 5)  Cranberry Tablets .Marland Kitchen.. 1 By Mouth Once Daily 6)  Tylenol Pm .... As Needed Pain/insomnia At Bedtime 7)  Vitamin D 1000 Unit  Tabs (Cholecalciferol) .Marland Kitchen.. 1 Daily By Mouth 8)  Aspirin 81mg  .... 1 By Mouth Once Daily 9)  Acyclovir .... Takes It Twice Weekly As Needed 10)  Anusol-Hc 2.5 % Crea (Hydrocortisone) .... Apply To Affected Area Once Daily As Needed For Hemorroids 11)  Magnesium 250 Mg Tabs (Magnesium) .... One To Two Tablets By Mouth Daily 12)  Keflex 250 Mg Caps (Cephalexin) .Marland Kitchen.. 1 Tab By Mouth Two Times A Day X 7 Days  Allergies: 1)  Codeine 2)  * Toprol 3)  Biaxin 4)  Amoxicillin 5)  * Zegrid  Physical Exam  General:  overweight but generally well appearing  VSS, non toxic appearing Abdomen:  Bowel sounds positive,abdomen soft. Mild  suprapubic tenderness or fullness felt to direct palpation. Msk:  no CVAT.   Impression & Recommendations:  Problem # 1:  ACUTE CYSTITIS (ICD-595.0) Assessment Deteriorated  Recurrent. Start  Cipro 500 two times a day and hydrate well. U/C sent. The following medications were removed from the medication list:    Keflex 250 Mg Caps (Cephalexin) .Marland Kitchen... 1 tab by mouth two times a day x 7 days Her updated medication list for this problem includes:    Cipro 500 Mg Tabs (Ciprofloxacin hcl) ..... One tab by mouth two times a day  Encouraged to push clear liquids, get enough rest, and take acetaminophen as needed. To be seen in 10 days if no improvement, sooner if worse.  Orders: Specimen Handling (08657) T-Culture, Urine ([id number])  Complete Medication List: 1)  Atenolol 25 Mg Tabs (Atenolol) .... Take one half by mouth every day 2)  Calcium 600 Tabs (Calcium carbonate tabs) ....  Take two by mouth daily 3)  5 Htp Over The Counter 100 Mg  .Marland KitchenMarland Kitchen. 1 by mouth once daily 4)  Mvi  .Marland Kitchen.. 1 by mouth once daily 5)  Cranberry Tablets  .Marland KitchenMarland Kitchen. 1 by mouth once daily 6)  Tylenol Pm  .... As needed pain/insomnia at bedtime 7)  Vitamin D 1000 Unit Tabs (Cholecalciferol) .Marland Kitchen.. 1 daily by mouth 8)  Aspirin 81mg   .... 1 by mouth once daily 9)  Acyclovir  .... Takes it twice weekly as needed 10)  Anusol-hc 2.5 % Crea (Hydrocortisone) .... Apply to affected area once daily as needed for hemorroids 11)  Magnesium 250 Mg Tabs (Magnesium) .... One to two tablets by mouth daily 12)  Cipro 500 Mg Tabs (Ciprofloxacin hcl) .... One tab by mouth two times a day  Other Orders: UA Dipstick W/ Micro (manual) ([id number])  Patient Instructions: 1)  RTC if sxs recur soon or worsen for poss Urology Referral. Prescriptions: CIPRO 500 MG TABS (CIPROFLOXACIN HCL) one tab by mouth two times a day  #20 x 0   Entered and Authorized by:   Shaune Leeks MD   Signed by:   Shaune Leeks MD on 10/23/2010   Method used:   Electronically to        [facility] [id number] (retail)       [address]       [place], [place]  [address]       Ph: [phone] or [phone]       Fax: [phone]   RxID:   [id number]    Orders Added: 1)  UA Dipstick W/ Micro (manual) [81000] 2)  Est. Patient Level III [57322] 3)  Specimen Handling [99000] 4)  T-Culture, Urine [[id number]]    Current Allergies (reviewed today): CODEINE * TOPROL BIAXIN AMOXICILLIN * ZEGRID  Laboratory Results   Urine Tests  Date/Time Received: October 23, 2010 12:00 PM  Date/Time Reported: October 23, 2010 12:01 PM   Routine Urinalysis   Color: yellow Appearance: Cloudy Glucose: negative   (Normal Range: Negative) Bilirubin: negative   (Normal Range: Negative) Ketone: negative   (Normal Range: Negative) Spec. Gravity: 1.010   (Normal Range: 1.003-1.035) Blood: large   (Normal Range: Negative) pH: 6.0    (Normal Range: 5.0-8.0) Protein: trace   (Normal Range: Negative) Urobilinogen: 0.2   (Normal Range: 0-1) Nitrite: negative   (Normal Range: Negative) Leukocyte Esterace: moderate   (Normal Range: Negative)  Urine Microscopic WBC/HPF: 8-12 RBC/HPF: 0-1 Bacteria/HPF: 1+ Epithelial/HPF: Rare

## 2010-11-08 NOTE — Assessment & Plan Note (Signed)
Summary: UTI (?) / LFW   Vital Signs:  Patient profile:   60 year old female Height:      65 inches Weight:      176 pounds BMI:     29.39 Temp:     98.2 degrees F oral Pulse rate:   67 / minute Pulse rhythm:   regular BP sitting:   102 / 72  (left arm) Cuff size:   regular  Vitals Entered By: Linde Gillis CMA Duncan Dull) (October 12, 2010 11:38 AM) CC: ? uti   History of Present Illness: 60 yo here for ? UTI. 3 days of is having pain to urinate and urine is cloudy  increased frequency no n/v  no fever  a little abd pain  no back pain  reviewed chart, last two UTIs mutlidrug resistant E.col- resistant to Cipro and Bactrim.  Sensitive to Keflex.  Current Medications (verified): 1)  Atenolol 25 Mg Tabs (Atenolol) .... Take One Half By Mouth Every Day 2)  Calcium 600  Tabs (Calcium Carbonate Tabs) .... Take Two By Mouth Daily 3)  5 Htp Over The Counter 100 Mg .Marland Kitchen.. 1 By Mouth Once Daily 4)  Mvi .Marland Kitchen.. 1 By Mouth Once Daily 5)  Cranberry Tablets .Marland Kitchen.. 1 By Mouth Once Daily 6)  Tylenol Pm .... As Needed Pain/insomnia At Bedtime 7)  Vitamin D 1000 Unit  Tabs (Cholecalciferol) .Marland Kitchen.. 1 Daily By Mouth 8)  Aspirin 81mg  .... 1 By Mouth Once Daily 9)  Acyclovir .... Takes It Twice Weekly As Needed 10)  Anusol-Hc 2.5 % Crea (Hydrocortisone) .... Apply To Affected Area Once Daily As Needed For Hemorroids 11)  Magnesium 250 Mg Tabs (Magnesium) .... One To Two Tablets By Mouth Daily 12)  Keflex 250 Mg Caps (Cephalexin) .Marland Kitchen.. 1 Tab By Mouth Two Times A Day X 7 Days  Allergies: 1)  Codeine 2)  * Toprol 3)  Biaxin 4)  Amoxicillin 5)  * Zegrid  Past History:  Past Medical History: Last updated: 09/14/2010 basal cell skin ca 5/08 melanoma  chronic L sided abd/CW pain hyperlipidemia  HSV 2  GERD --HH SVT hx of  chronic fatigue   osteopenia    derm-- Dr Nicholas Lose  cardiol Dr Mariah Milling gyn [facility]  Past Surgical History: Last updated: 06/30/2010 MRI neck C4-C5 herniation small  stenosis mild C4-5, C5-6  3/00 Gyn surgery, uterine mass (not malignant) 10/00 Hysterectomy melanoma  Dexa- osteopenia 2004 Stress cardiolite neg. EF 47% 4/05 Carotid left- mild recheck 1year 9/05 Colonocopy neg. 3/06 Dexa  (Gyn's office)  Carotid normal 10/06 btl  Nuclear stress test neg. 8/07 EGD normal 9/07 CT abd/pelvis (no contrast) neg., no stones 9/07 abn mole removed from leg- ? basal cell  9/11 hosp for CP and palpatations- ruled out for MI with nl echo  Family History: Last updated: 09/14/2010 father CAD in 61s, ETOH mother uterine ca, HTN, obese , CLL and throbocytopenia  GM DM in older age GF melamoma  P aunt CAD Puncle CAD  Social History: Last updated: 03/14/2008 Patient is a former smoker. - quit 30 years ago no alcohol  married  cleans houses- active job  Risk Factors: Smoking Status: quit (01/12/2007)  Review of Systems      See HPI General:  Denies fever. GI:  Denies nausea and vomiting. GU:  Complains of dysuria and urinary frequency; denies hematuria and incontinence.  Physical Exam  General:  overweight but generally well appearing  VSS, non toxic appearing Mouth:  MMM Abdomen:  Bowel sounds positive,abdomen soft and non-tender without masses, organomegaly or hernias noted. no suprapubic tenderness or fullness felt  Psych:  seems frustrated and fatigued today- but overall pleasant    Impression & Recommendations:  Problem # 1:  ACUTE CYSTITIS (ICD-595.0) Assessment New  with h/o multidrug resistance. treat with keflex, send for culture. Her updated medication list for this problem includes:    Keflex 250 Mg Caps (Cephalexin) .Marland Kitchen... 1 tab by mouth two times a day x 7 days  Orders: Specimen Handling (16109) T-Culture, Urine ([id number])  Complete Medication List: 1)  Atenolol 25 Mg Tabs (Atenolol) .... Take one half by mouth every day 2)  Calcium 600 Tabs (Calcium carbonate tabs) .... Take two by mouth daily 3)  5 Htp Over The  Counter 100 Mg  .Marland KitchenMarland Kitchen. 1 by mouth once daily 4)  Mvi  .Marland Kitchen.. 1 by mouth once daily 5)  Cranberry Tablets  .Marland KitchenMarland Kitchen. 1 by mouth once daily 6)  Tylenol Pm  .... As needed pain/insomnia at bedtime 7)  Vitamin D 1000 Unit Tabs (Cholecalciferol) .Marland Kitchen.. 1 daily by mouth 8)  Aspirin 81mg   .... 1 by mouth once daily 9)  Acyclovir  .... Takes it twice weekly as needed 10)  Anusol-hc 2.5 % Crea (Hydrocortisone) .... Apply to affected area once daily as needed for hemorroids 11)  Magnesium 250 Mg Tabs (Magnesium) .... One to two tablets by mouth daily 12)  Keflex 250 Mg Caps (Cephalexin) .Marland Kitchen.. 1 tab by mouth two times a day x 7 days  Other Orders: UA Dipstick w/o Micro (manual) (91478) Prescriptions: KEFLEX 250 MG CAPS (CEPHALEXIN) 1 tab by mouth two times a day x 7 days  #14 x 0   Entered and Authorized by:   Ruthe Mannan MD   Signed by:   Ruthe Mannan MD on 10/12/2010   Method used:   Electronically to        [facility]  [address] [id number] (retail)       [address]       [place], [place]  [address]       Ph: [phone] or [phone]       Fax: [phone]   RxID:   [id number]    Orders Added: 1)  UA Dipstick w/o Micro (manual) [81002] 2)  Specimen Handling [99000] 3)  T-Culture, Urine [[id number]] 4)  Est. Patient Level IV [16606]    Current Allergies (reviewed today): CODEINE * TOPROL BIAXIN AMOXICILLIN * ZEGRID  Laboratory Results   Urine Tests  Date/Time Received: October 12, 2010 11:49 AM   Routine Urinalysis   Color: yellow Appearance: Clear Glucose: negative   (Normal Range: Negative) Bilirubin: negative   (Normal Range: Negative) Ketone: negative   (Normal Range: Negative) Spec. Gravity: <1.005   (Normal Range: 1.003-1.035) Blood: moderate   (Normal Range: Negative) pH: 8.5   (Normal Range: 5.0-8.0) Protein: negative   (Normal Range: Negative) Urobilinogen: 0.2   (Normal Range: 0-1) Nitrite: negative   (Normal Range: Negative) Leukocyte Esterace: small   (Normal  Range: Negative)

## 2010-11-12 ENCOUNTER — Other Ambulatory Visit: Payer: Self-pay | Admitting: Internal Medicine

## 2010-11-12 ENCOUNTER — Ambulatory Visit (INDEPENDENT_AMBULATORY_CARE_PROVIDER_SITE_OTHER): Payer: BC Managed Care – PPO | Admitting: Internal Medicine

## 2010-11-12 ENCOUNTER — Encounter: Payer: Self-pay | Admitting: Internal Medicine

## 2010-11-12 DIAGNOSIS — R109 Unspecified abdominal pain: Secondary | ICD-10-CM

## 2010-11-13 ENCOUNTER — Ambulatory Visit
Admission: RE | Admit: 2010-11-13 | Discharge: 2010-11-13 | Disposition: A | Discharge status: Home / Self Care | Payer: BC Managed Care – PPO | Source: Ambulatory Visit | Attending: Internal Medicine | Admitting: Internal Medicine

## 2010-11-13 DIAGNOSIS — R109 Unspecified abdominal pain: Secondary | ICD-10-CM

## 2010-11-13 HISTORY — DX: Malignant melanoma of skin, unspecified: C43.9

## 2010-11-13 MED ORDER — IOHEXOL 300 MG/ML  SOLN
100.0000 mL | Freq: Once | INTRAMUSCULAR | Status: AC | PRN
  Administered 2010-11-13: 100 mL via INTRAVENOUS

## 2010-11-14 ENCOUNTER — Telehealth: Payer: Self-pay | Admitting: Internal Medicine

## 2010-11-14 NOTE — Procedures (Signed)
Summary: Colonoscopy   Colonoscopy  Procedure date:  12/14/2004  Findings:      Location:  [facility].    Procedures Next Due Date:    Colonoscopy: 12/2014 Patient Name: [name], [name] MRN:  Procedure Procedures: Colonoscopy CPT: 54098.  Personnel: Endoscopist: Iva Boop, MD, [facility].  Referred By: Roxy Manns, MD.  Exam Location: Exam performed in Outpatient Clinic. Outpatient  Patient Consent: Procedure, Alternatives, Risks and Benefits discussed, consent obtained, from patient. Consent was obtained by the RN.  Indications  Average Risk Screening Routine.  History  Current Medications: Patient is not currently taking Coumadin.  Pre-Exam Physical: Performed Dec 14, 2004. Cardio-pulmonary exam, Rectal exam, HEENT exam , Mental status exam WNL.  Exam Exam: Extent of exam reached: Cecum, extent intended: Cecum.  The cecum was identified by appendiceal orifice and IC valve. Patient position: on left side. ASA Classification: II. Tolerance: good.  Monitoring: Pulse and BP monitoring, Oximetry used. Supplemental O2 given.  Colon Prep Prep results: good.  Sedation Meds: Patient assessed and found to be appropriate for moderate (conscious) sedation. Fentanyl 75 mcg. given IV. Versed 8 mg. given IV.  Findings - NORMAL EXAM: Cecum to Sigmoid Colon.  HEMORRHOIDS: External. Size: Grade I. ICD9: Hemorrhoids, External: 455.3.   Assessment Abnormal examination, see findings above.  Diagnoses: 455.3: Hemorrhoids, External.   Comments: NO POLYPS OR CANCER SEEN Events  Unplanned Interventions: No intervention was required.  Plans Patient Education: Patient given standard instructions for: Hemorrhoids.  Disposition: After procedure patient sent to recovery. After recovery patient sent home.  Scheduling/Referral: Colonoscopy, to Iva Boop, MD, Jenene Slicker,  Primary Care Provider, to Roxy Manns, MD, AS PLANNED,     This report was  created from the original endoscopy report, which was reviewed and signed by the above listed endoscopist.

## 2010-11-14 NOTE — Progress Notes (Signed)
Summary: sooner appt   Phone Note Call from Patient Call back at Home Phone [phone] Call back at Work Phone [phone]   [name]: Patient Call For: Dr Leone Payor Reason for Call: Talk to Nurse Summary of Call: Patient wants to be seen sooner than first available appt 3-7 for rlq pain x 3 weeks. Initial call taken by: Tawni Levy,  November 06, 2010 9:00 AM  Follow-up for Phone Call        patient is scheduled with Dr Leone Payor for 11/12/10 9:30.  I will send her new patient paperwork. Follow-up by: Darcey Nora RN, CGRN,  November 06, 2010 10:28 AM

## 2010-11-14 NOTE — Letter (Signed)
Summary: New Patient letter  [facility]  [address] [place], [place] [address]   Phone: [phone]  Fax: [phone]       11/06/2010 MRN: [id number]  [name] [name] [address] [place], [place]  [address]  Dear Ms. Sherrie Mustache,  Welcome to the Gastroenterology Division at [facility].    You are scheduled to see Dr.  Leone Payor on 11/12/10 at 9:30 am on the 3rd floor at [facility], [address]  We ask that you try to arrive at our office 15 minutes prior to your appointment time to allow for check-in.  We would like you to complete the enclosed self-administered evaluation form prior to your visit and bring it with you on the day of your appointment.  We will review it with you.  Also, please bring a complete list of all your medications or, if you prefer, bring the medication bottles and we will list them.  Please bring your insurance card so that we may make a copy of it.  If your insurance requires a referral to see a specialist, please bring your referral form from your primary care physician.  Co-payments are due at the time of your visit and may be paid by cash, check or credit card.     Your office visit will consist of a consult with your physician (includes a physical exam), any laboratory testing he/she may order, scheduling of any necessary diagnostic testing (e.g. x-ray, ultrasound, CT-scan), and scheduling of a procedure (e.g. Endoscopy, Colonoscopy) if required.  Please allow enough time on your schedule to allow for any/all of these possibilities.    If you cannot keep your appointment, please call [phone] to cancel or reschedule prior to your appointment date.  This allows [place] the opportunity to schedule an appointment for another patient in need of care.  If you do not cancel or reschedule by 5 p.m. the business day prior to your appointment date, you will be charged a $50.00 late cancellation/no-show fee.    Thank you for choosing  [facility] for your medical needs.  We appreciate the opportunity to care for you.  Please visit [place] at our website  to learn more about our practice.                     Sincerely,                                                             The Gastroenterology Division

## 2010-11-14 NOTE — Consult Note (Signed)
Summary: Chest Discomfort    NAME:  [name] [name], [name] [name]               ACCOUNT NO.:  [id number]      MEDICAL RECORD NO.:  [id number]          PATIENT TYPE:  INP      LOCATION:  3714                         FACILITY:  [facility]      PHYSICIAN:  Rollene Rotunda, MD, FACCDATE OF BIRTH:  Dec 25, 1950      DATE OF CONSULTATION:  06/29/2010   DATE OF DISCHARGE:  06/29/2010                                    CONSULTATION      PRIMARY CARDIOLOGIST:  Antonieta Iba, MD      PRIMARY CARE DOCTOR:  Marne A. Tower, MD      CHIEF COMPLAINT:  Left chest discomfort and shaking.      HISTORY OF PRESENT ILLNESS:  Ms. [name] [name] is a 60 year old female with no   prior history of coronary artery disease who was seen by Dr. Mariah Milling in   March for history of palpitations.  She was also seen in 2005 when    she wore an event monitor at that time, which showed   a 5-beat run of SVT, and occasional PVCs.  She also has history of   GERD and possibly hyperlipidemia, she is unsure.  She had a negative   stress test in 2007 with an EF of 51%.  She ate out last night, took   Mobic which she has been taking for 1 week, then one hour later   developed constant left chest pain under her breasts, sharp, radiating   to her back persisted with shaking, clamminess, brief shortness of breath, and   sensation of anxiety.  She ate crackers and milk which made it worse.   Positional changes have also made it worse.  She states her back was   incredibly tender to palpation yesterday and she had tenderness under   her left breast as well.  Her pain was relieved in the ER by fentanyl   and then this morning by Dilaudid.  She had approximately 10 hours of pain   and cardiac enzymes have been negative x4.  She currently feels better,   just slightly anxious regarding her episode.  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  No palpitations with this   episode, except for rarely this morning and telemetry confirms PACs.        PAST MEDICAL HISTORY:   1. GERD.   2. History of SVT in 2005, 5 beats x2 episodes along with rare PVCs on       event monitor at that time.   3. History of melanoma removed from the side of her trunk as well as       basal cell carcinoma removed from her face, followed by Dermatology       every 6 months.   4. Possible history of hyperlipidemia per previous office note, but       the patient denies knowing so.      PAST SURGICAL HISTORY:  Hysterectomy, BTL, and tonsillectomy.      MEDICATIONS:   1. Atenolol 12.5 mg  daily.   2. Mobic.   3. Tylenol p.r.n.   4. Benadryl.      INPATIENT MEDICATIONS:   1. Aspirin 325 mg daily.   2. Atenolol 25 mg daily.   3. Lovenox 40 mg subcutaneously.   4. Protonix 80 mg daily.      ALLERGIES:  She once had a rash while taking both AMOXICILLIN and   CODEINE at the same time, so she is unsure which one she is actually   allergic to.      SOCIAL HISTORY:  She is married and lives with her husband of 23 years   in [place].  She cleans hospitals for living.  She has 5 children.   She quit smoking 30 years ago and smokes approximately 1-pack per day   for 15 years.  She denies any alcohol or illicit drug use.      FAMILY HISTORY:  Her mother is living 29 and is well with no known   health problems.  Her father died at age 40 of cirrhosis of the liver,   but also had a history of an MI.  She has one sister with a history of a   stroke at age 55 and a brother with stomach problems.      REVIEW OF SYSTEMS:  No fever, chills, although she felt cold sweats.  No   headaches.  Positive for left breast pain, brief shortness of breath,   clamminess, and shakiness.  No lower extremity edema, PND, orthopnea,   syncope, cough, wheezing, urinary frequency, hematuria, hematemesis,   melena, bright red blood per rectum, vomiting, or diarrhea.  Otherwise all other   systems reviewed and negative.      LABORATORY DATA:  WBC 6.4, hemoglobin 13.9,  hematocrit 41, platelet   count 195.  Sodium 141, potassium 3.6, chloride 107, BUN 15, creatinine   0.9, glucose 98.  Her LFTs were completely normal.  TSH is pending.   Cardiac enzymes negative x4.  Lipase is 41.      STUDIES:   1. Abdominal x-ray showed unremarkable bowel gas pattern and no free       air.   2. Chest x-ray showed minimal basilar atelectasis, right convex tube.       Thoracic scoliosis noted.   3. EKG:  Normal sinus rhythm with no acute ST or T changes, sinus       bradycardia.      PHYSICAL EXAMINATION:  VITAL SIGNS:  Temperature 97.5, pulse 57,   respirations 19, blood pressure 109/79.   GENERAL:  This is a well-kempt, well-appearing female, in no acute   distress.   HEENT:  She is normocephalic, atraumatic.  PERRLA.  Extraocular   movements intact with clear sclerae.   NECK:  Supple.   HEART:  Auscultation to the heart reveals regular rate and rhythm   without obvious murmurs, rubs, or gallops.  She has quite heart sounds.   LUNGS:  Clear to auscultation bilaterally.   BACK:  Her back is without any trauma, swelling, or current tenderness.   ABDOMEN:  Soft, nontender, nondistended with positive bowel sounds.   EXTREMITIES:  Warm, dry, and without edema.  She has 2+ pedal pulses   bilaterally.   NEUROLOGIC:  She is alert and oriented x3.  Response to questions   appropriately.  She has normal affect, but appears somewhat anxious.      ASSESSMENT:   1. Atypical chest pain with negative cardiac enzymes.   2. History  of supraventricular tachycardia in 2005/premature       ventricular contractions, on atenolol.   3. Family history of coronary artery disease.   4. Unknown lipid status.   5. Gastroesophageal reflux disease with recent initiation of Mobic.   6. Mild mitral regurgitation.      PLAN:  The patient was seen and examined by Dr. Antoine Poche and myself.  At   this time, her chest pain does not sound cardiac in etiology.  Dr.   Antoine Poche as assessed her  echocardiogram while being performed in the room and it appears   that she has a normal EF.  She does have mild MR, but this should not be   contributing to her symptoms whatsoever and does not require   any further workup or intervention at this time.  Cardiac enzymes were negative   x4, and therefore we feel she is low risk.  We recommend a exercise   treadmill stress test, which will be arranged by our office as an   outpatient.  Other etiologies for her chest pain can be assessed per   primary team.  In regards to the mild MR, we have instructed the patient   that she may need a 2-D echocardiogram in 1 year and to speak with Dr.   Mariah Milling regarding that.               Ronie Spies, P.A.C.         ______________________________   Rollene Rotunda, MD, [facility]         DD/MEDQ  D:  06/29/2010  T:  06/30/2010  Job:  [id number]      cc:   Antonieta Iba, MD   Audrie Gallus Milinda Antis, MD      Electronically Signed by Ronie Spies  on 06/30/2010 03:15:21 PM   Electronically Signed by Rollene Rotunda MD [facility] on 07/14/2010 03:23:17 PM

## 2010-11-14 NOTE — Procedures (Signed)
Summary: EGD   EGD  Procedure date:  06/23/2006  Findings:      Location: [facility]   Patient Name: [name] [name], [name] [name] MRN:  Procedure Procedures: Panendoscopy (EGD) CPT: 43235.  Personnel: Endoscopist: Iva Boop, MD, [facility].  Referred By: Roxy Manns, MD.  Exam Location: Exam performed in Outpatient Clinic. Outpatient  Patient Consent: Procedure, Alternatives, Risks and Benefits discussed, consent obtained, from patient. Consent was obtained by the RN.  Indications Symptoms: Abdominal pain, location: LUQ.  History  Current Medications: Patient is not currently taking Coumadin.  Allergies: Patient is allergic to AMOXICILLIN, CODEINE.  Comments: Left upper quadrant pain with histry of indigestion and reflux. Symptoms have occurred despite Nexium and hyoscamine treatment of unclear benefit. T spine and CXR films unrevealing as to cause. Pre-Exam Physical: Performed Jun 23, 2006  Cardio-pulmonary exam, HEENT exam, Abdominal exam, Mental status exam WNL.  Comments: Pt. history reviewed/updated, physical exam performed prior to initiation of sedation? YES Exam Exam Info: Maximum depth of insertion Duodenum, intended Duodenum. Patient position: on left side. Gastric retroflexion performed. Images taken. ASA Classification: I. Tolerance: excellent.  Sedation Meds: Patient assessed and found to be appropriate for moderate (conscious) sedation. Fentanyl 50 mcg. given IV. Versed 5 mg. given IV. Cetacaine Spray 2 sprays given aerosolized.  Monitoring: BP and pulse monitoring done. Oximetry used. Supplemental O2 given  Findings - Normal: Proximal Esophagus to Duodenal 2nd Portion.   Assessment  Comments: NORMAL EXAM. CAUSE OF SYMPTOMS NOT SEEN.  Events  Unplanned Intervention: No unplanned interventions were required.  Plans Comments: TRIAL OF ZEGERID AND SEE ME IN 4-6 WEEKS.  SAMPLES GIVEN Disposition: After procedure patient sent to  recovery. After recovery patient sent home.  Scheduling: Clinic Visit, to Iva Boop, MD, [facility], 4-6 WEEKS   cc: Roxy Manns, MD     Patient  This report was created from the original endoscopy report, which was reviewed and signed by the above listed endoscopist.

## 2010-11-14 NOTE — Assessment & Plan Note (Signed)
Summary: COUGH,HA,CHILLS/CLE   Vital Signs:  Patient profile:   60 year old female Height:      65 inches Weight:      174.50 pounds BMI:     29.14 Temp:     98.6 degrees F oral Pulse rate:   104 / minute Pulse rhythm:   regular BP sitting:   120 / 78  (left arm) Cuff size:   large  Vitals Entered By: Linde Gillis CMA [facility] Dull) (November 06, 2010 9:40 AM) CC: cough, headache, chills   History of Present Illness: 60 yo with 3 days of URI symptoms.  Started with cough, dry.  Now has body aches, chills, left sided rib pain when she coughs. No fever that she is aware of. No runny nose, throat is a little scratchy. Taking Robitusin without much relief.  Has allergy to codeine- rash.  Current Medications (verified): 1)  Atenolol 25 Mg Tabs (Atenolol) .... Take One Half By Mouth Every Day 2)  Calcium 600  Tabs (Calcium Carbonate Tabs) .... Take Two By Mouth Daily 3)  5 Htp Over The Counter 100 Mg .Marland Kitchen.. 1 By Mouth Once Daily 4)  Mvi .Marland Kitchen.. 1 By Mouth Once Daily 5)  Cranberry Tablets .Marland Kitchen.. 1 By Mouth Once Daily 6)  Tylenol Pm .... As Needed Pain/insomnia At Bedtime 7)  Vitamin D 1000 Unit  Tabs (Cholecalciferol) .Marland Kitchen.. 1 Daily By Mouth 8)  Aspirin 81mg  .... 1 By Mouth Once Daily 9)  Acyclovir .... Takes It Twice Weekly As Needed 10)  Anusol-Hc 2.5 % Crea (Hydrocortisone) .... Apply To Affected Area Once Daily As Needed For Hemorroids 11)  Magnesium 250 Mg Tabs (Magnesium) .... One To Two Tablets By Mouth Daily 12)  Tessalon Perles 100 Mg  Caps (Benzonatate) .Marland Kitchen.. 1 Cap By Mouth Three Times A Day As Needed Cough.  Allergies: 1)  Codeine 2)  * Toprol 3)  Biaxin 4)  Amoxicillin 5)  * Zegrid  Past History:  Past Medical History: Last updated: 09/14/2010 basal cell skin ca 5/08 melanoma  chronic L sided abd/CW pain hyperlipidemia  HSV 2  GERD --HH SVT hx of  chronic fatigue   osteopenia    derm-- Dr Nicholas Lose  cardiol Dr Mariah Milling gyn [facility]  Past Surgical History: Last  updated: 06/30/2010 MRI neck C4-C5 herniation small stenosis mild C4-5, C5-6  3/00 Gyn surgery, uterine mass (not malignant) 10/00 Hysterectomy melanoma  Dexa- osteopenia 2004 Stress cardiolite neg. EF 47% 4/05 Carotid left- mild recheck 1year 9/05 Colonocopy neg. 3/06 Dexa  (Gyn's office)  Carotid normal 10/06 btl  Nuclear stress test neg. 8/07 EGD normal 9/07 CT abd/pelvis (no contrast) neg., no stones 9/07 abn mole removed from leg- ? basal cell  9/11 hosp for CP and palpatations- ruled out for MI with nl echo  Family History: Last updated: 09/14/2010 father CAD in 68s, ETOH mother uterine ca, HTN, obese , CLL and throbocytopenia  GM DM in older age GF melamoma  P aunt CAD Puncle CAD  Social History: Last updated: 03/14/2008 Patient is a former smoker. - quit 30 years ago no alcohol  married  cleans houses- active job  Risk Factors: Smoking Status: quit (01/12/2007)  Review of Systems      See HPI General:  Complains of chills; denies fever. ENT:  Complains of nasal congestion, postnasal drainage, and sore throat; denies decreased hearing, difficulty swallowing, and sinus pressure. Resp:  Complains of cough; denies shortness of breath, sputum productive, and wheezing.  Physical Exam  General:  overweight but generally well appearing  VSS, non toxic appearing Eyes:  vision grossly intact, pupils equal, pupils round, and pupils reactive to light.  no conjunctival pallor, injection or icterus  Ears:  R ear normal and L ear normal.   Nose:  no nasal discharge.   mild erythema, no edema Mouth:  pharynx pink and moist.   Chest Wall:  mildly TTP over left inferior rib cage Lungs:  Normal respiratory effort, chest expands symmetrically. Lungs are clear to auscultation, no crackles or wheezes. Heart:  Normal rate and regular rhythm. S1 and S2 normal without gallop, murmur, click, rub or other extra sounds. Extremities:  No clubbing, cyanosis, edema, or deformity noted  with normal full range of motion of all joints.   Neurologic:  sensation intact to light touch, gait normal, and DTRs symmetrical and normal.   Psych:  normal affect, talkative and pleasant    Impression & Recommendations:  Problem # 1:  URI (ICD-465.9) Assessment New Likely viral. Supportive care as per pt instructions. Her updated medication list for this problem includes:    Tessalon Perles 100 Mg Caps (Benzonatate) .Marland Kitchen... 1 cap by mouth three times a day as needed cough.  Complete Medication List: 1)  Atenolol 25 Mg Tabs (Atenolol) .... Take one half by mouth every day 2)  Calcium 600 Tabs (Calcium carbonate tabs) .... Take two by mouth daily 3)  5 Htp Over The Counter 100 Mg  .Marland KitchenMarland Kitchen. 1 by mouth once daily 4)  Mvi  .Marland Kitchen.. 1 by mouth once daily 5)  Cranberry Tablets  .Marland KitchenMarland Kitchen. 1 by mouth once daily 6)  Tylenol Pm  .... As needed pain/insomnia at bedtime 7)  Vitamin D 1000 Unit Tabs (Cholecalciferol) .Marland Kitchen.. 1 daily by mouth 8)  Aspirin 81mg   .... 1 by mouth once daily 9)  Acyclovir  .... Takes it twice weekly as needed 10)  Anusol-hc 2.5 % Crea (Hydrocortisone) .... Apply to affected area once daily as needed for hemorroids 11)  Magnesium 250 Mg Tabs (Magnesium) .... One to two tablets by mouth daily 12)  Tessalon Perles 100 Mg Caps (Benzonatate) .Marland Kitchen.. 1 cap by mouth three times a day as needed cough.  Patient Instructions: 1)    Drink lots of fluids.  Treat sympotmatically with Mucinex,  and Tylenol/Ibuprofen. Also try claritin D or zyrtec D over the counter- two times a day as needed ( have to sign for them at pharmacy). You can use warm compresses.  Cough suppressant (Tessalon) at night. Call if not improving as expected in 5-7 days.  Prescriptions: TESSALON PERLES 100 MG  CAPS (BENZONATATE) 1 cap by mouth three times a day as needed cough.  #30 x 0   Entered and Authorized by:   Ruthe Mannan MD   Signed by:   Ruthe Mannan MD on 11/06/2010   Method used:   [facility]   [id number]       [address]       [place], [place]  [address]       Ph: [phone] or [phone]       Fax: [phone]   RxID:   [id number]    Orders Added: 1)  Est. Patient Level III [36644]    Current Allergies (reviewed today): CODEINE * TOPROL BIAXIN AMOXICILLIN * ZEGRID

## 2010-11-16 ENCOUNTER — Other Ambulatory Visit: Payer: Self-pay | Admitting: Dermatology

## 2010-11-22 NOTE — Consult Note (Signed)
Summary: [facility]   Imported By: Sherian Rein 11/13/2010 06:51:34  _____________________________________________________________________  External Attachment:    Type:   Image     Comment:   External Document

## 2010-11-22 NOTE — Assessment & Plan Note (Signed)
Summary: abdominal pain/[name] [name]    History of Present Illness Primary GI MD: Stan Head MD [facility] Primary Provider: Colon Flattery Tower MD Chief Complaint: Intermittant episodes of RLQ and lower abd pains x 2 weeks. Pt has some RLQ tenderness now but the lower abd pain is better. Pt had plain film that showed stool but nothing else.  History of Present Illness:   Problems began in January. she noticed pain in suprapubic and RLQ. Started out of the blue. 2 weeks prior she had an E. coli UTI. So treated for that when she re-presented wth this abdominal painand when urine studies came back negative cipro stopped. She has improved but says she is still ender in RLQ and suprapubic area. She was anorectic and having urge to defecate associated with sharp abdominal and some rectal pains. She was bloated. + chills (felt cold) but no fever. No dysuria. she has been Production designer, theatre/television/film" in defecation. She began Align but after a week not much difference. She almost did not come because of improvement.    GI Review of Systems    Reports abdominal pain, bloating, and  loss of appetite.     Location of  Abdominal pain: RLQ and lower.    Denies acid reflux, belching, chest pain, dysphagia with liquids, dysphagia with solids, heartburn, nausea, vomiting, vomiting blood, weight loss, and  weight gain.      Reports constipation and  rectal pain.     Denies anal fissure, black tarry stools, change in bowel habit, diarrhea, diverticulosis, fecal incontinence, heme positive stool, hemorrhoids, irritable bowel syndrome, jaundice, light color stool, liver problems, and  rectal bleeding.    Clinical Reports Reviewed:  Colonoscopy:  12/14/2004:  Location:  [facility].   12/14/2004:  normal   Current Medications (verified): 1)  Atenolol 25 Mg Tabs (Atenolol) .... Take One Half By Mouth Every Day 2)  Calcium 600  Tabs (Calcium Carbonate Tabs) .... Take Two By Mouth Daily 3)  5 Htp Over The Counter 100 Mg  .Marland Kitchen.. 1 By Mouth Once Daily 4)  Mvi .Marland Kitchen.. 1 By Mouth Once Daily 5)  Cranberry Tablets .Marland Kitchen.. 1 By Mouth Once Daily 6)  Tylenol Pm .... As Needed Pain/insomnia At Bedtime 7)  Vitamin D 1000 Unit  Tabs (Cholecalciferol) .Marland Kitchen.. 1 Daily By Mouth 8)  Aspirin 81mg  .... 1 By Mouth Once Daily 9)  Acyclovir .... Takes It Twice Weekly As Needed 10)  Anusol-Hc 2.5 % Crea (Hydrocortisone) .... Apply To Affected Area Once Daily As Needed For Hemorroids 11)  Magnesium 250 Mg Tabs (Magnesium) .... One To Two Tablets By Mouth Daily 12)  Tessalon Perles 100 Mg  Caps (Benzonatate) .Marland Kitchen.. 1 Cap By Mouth Three Times A Day As Needed Cough.  Allergies (verified): 1)  Codeine 2)  * Toprol 3)  Biaxin 4)  Amoxicillin 5)  * Zegrid  Past History:  Past Medical History: Reviewed history from 09/14/2010 and no changes required. basal cell skin ca 5/08 melanoma  chronic L sided abd/CW pain hyperlipidemia  HSV 2  GERD --HH SVT hx of  chronic fatigue   osteopenia    derm-- Dr Nicholas Lose  cardiol Dr Mariah Milling [facility]yn [facility]  Past Surgical History: Reviewed history from 06/30/2010 and no changes required. MRI neck C4-C5 herniation small stenosis mild C4-5, C5-6  3/00 Gyn surgery, uterine mass (not malignant) 10/00 Hysterectomy melanoma  Dexa- osteopenia 2004 Stress cardiolite neg. EF 47% 4/05 Carotid left- mild recheck 1year 9/05 Colonocopy neg. 3/06 Dexa  (Gyn's office)  Carotid  normal 10/06 btl  Nuclear stress test neg. 8/07 EGD normal 9/07 CT abd/pelvis (no contrast) neg., no stones 9/07 abn mole removed from leg- ? basal cell  9/11 hosp for CP and palpatations- ruled out for MI with nl echo  Family History: Reviewed history from 11/08/2010 and no changes required. father CAD in 64s, ETOH mother uterine ca, HTN, obese , CLL and throbocytopenia  GM DM in older age GF melamoma  P aunt CAD Puncle CAD No FH of Colon Cancer:  Social History: Reviewed history from 11/08/2010 and no changes  required. Patient is a former smoker. 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  Review of Systems       see HPI also c/o chronically swollen ankles All other ROS negative except as per HPI.   Vital Signs:  Patient profile:   60 year old female Height:      65 inches Weight:      174.13 pounds Pulse rate:   100 / minute Pulse rhythm:   regular BP sitting:   104 / 62  (left arm) Cuff size:   regular  Vitals Entered By: Christie Nottingham CMA Duncan Dull) (November 12, 2010 9:25 AM)  Physical Exam  General:  Well developed, well nourished, no acute distress. Eyes:  anicteric Mouth:  No deformity or lesions, dentition normal. posterior pharynx clear Lungs:  Clear throughout to auscultation. Heart:  Regular rate and rhythm; no murmurs, rubs,  or bruits. Abdomen:  BS+ soft, tender in RLQ and suprapubc area with some guarding (involntary) no HSM or mass, no hernia n abdominal wall Msk:  no CVA tenderness Extremities:  non-pitting lower extremity edema in ankles Cervical Nodes:  No significant cervical or supraclavicular adenopathy.  Psych:  Alert and cooperative. Normal mood and affect.  Labs, old records reviewed  Impression & Recommendations:  Problem # 1:  ABDOMINAL PAIN, LOWER (ICD-789.09) Assessment Comment Only  NEW TO ME: better and apparently not a persistent or recurrent UTI, but she is tender with involuntary guarding. She could have partially treated diverticulitis. I think better accuracy in diagnosis appropriate so CT abd/pelvis with IV and oral contrast.May still try antibiotics even if that is negative. 2006 colonoscopy without diverticulosis (was negative overall) but she could still have these  Orders: CT Abdomen/Pelvis with Contrast (CT Abd/Pelvis w/con)  Patient Instructions: 1)  You have been scheduled for a CT scan of your abdomen/pelvis @ [facility], [address],  [address]. Please follow written instructions given to you at your visit today. 2)  The medication list was reviewed and reconciled.  All changed / newly prescribed medications were explained.  A complete medication list was provided to the patient / caregiver.

## 2010-11-22 NOTE — Progress Notes (Signed)
Summary: CT ok, ? constipation is problem   Phone Note Outgoing Call   Summary of Call: Let her know the CT is ok - no problems to cause her pain seen ? if constipation causing some/all of this If she is notr moving her bowels well I want her to do a miralax purge and let us know if that helped  Take 4 Dulcolax or Senokot tablets with a large glass of water. One (1) hour later drink Miralax 1 dose (1 packet or 1 tablespoon in 8 oz clear lquid) 6-8 times in 2-3 hours.  Iva Boop MD, [facility]  November 14, 2010 1:47 PM   Follow-up for Phone Call        Reviewed the results of the CT scan and Dr Marvell Fuller instructions.  She is asked to call back once she has completed the purge with an update. Follow-up by: Darcey Nora RN, CGRN,  November 15, 2010 10:37 AM

## 2010-11-27 ENCOUNTER — Ambulatory Visit (INDEPENDENT_AMBULATORY_CARE_PROVIDER_SITE_OTHER): Payer: BC Managed Care – PPO | Admitting: Family Medicine

## 2010-11-27 ENCOUNTER — Encounter: Payer: Self-pay | Admitting: Family Medicine

## 2010-11-27 DIAGNOSIS — L502 Urticaria due to cold and heat: Secondary | ICD-10-CM | POA: Insufficient documentation

## 2010-11-27 DIAGNOSIS — L2089 Other atopic dermatitis: Secondary | ICD-10-CM

## 2010-12-04 NOTE — Assessment & Plan Note (Signed)
Summary: red splotches come up on back/rash??? jrt   Vital Signs:  Patient profile:   60 year old female Weight:      175.75 pounds Temp:     98.4 degrees F oral Pulse rate:   60 / minute Pulse rhythm:   regular BP sitting:   104 / 70  (left arm) Cuff size:   regular  Vitals Entered By: Sydell Axon LPN (November 27, 2010 8:05 AM) CC: Check red spots on back ? rash   History of Present Illness: some places on the top part of her back    last 3 days when she takes a bath - they happen on top and side of back  no itching or pain at all - just noticed in mirror sort of dry  fade in 30 minutes  get red when getting hot   round dry scaley looking areas-- locoid lipocream .1% gets them occas and cream looks great Dr Nicholas Lose   Allergies: 1)  Codeine 2)  * Toprol 3)  Biaxin 4)  Amoxicillin 5)  * Zegrid  Past History:  Past Surgical History: Last updated: 06/30/2010 MRI neck C4-C5 herniation small stenosis mild C4-5, C5-6  3/00 Gyn surgery, uterine mass (not malignant) 10/00 Hysterectomy melanoma  Dexa- osteopenia 2004 Stress cardiolite neg. EF 47% 4/05 Carotid left- mild recheck 1year 9/05 Colonocopy neg. 3/06 Dexa  (Gyn's office)  Carotid normal 10/06 btl  Nuclear stress test neg. 8/07 EGD normal 9/07 CT abd/pelvis (no contrast) neg., no stones 9/07 abn mole removed from leg- ? basal cell  9/11 hosp for CP and palpatations- ruled out for MI with nl echo  Family History: Last updated: 11/08/2010 father CAD in 32s, ETOH mother uterine ca, HTN, obese , CLL and throbocytopenia  GM DM in older age GF melamoma  P aunt CAD Puncle CAD No FH of Colon Cancer:  Social History: Last updated: 11/08/2010 Patient is a former smoker. 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  Risk Factors: Exercise: yes (11/08/2010)  Risk Factors: Smoking Status: quit (01/12/2007)  Past Medical  History: basal cell skin ca 5/08 melanoma  chronic L sided abd/CW pain hyperlipidemia  HSV 2  GERD --HH SVT hx of  chronic fatigue  eczema   osteopenia    derm-- Dr Nicholas Lose  cardiol Dr Mariah Milling gyn [facility]  Review of Systems General:  Denies fatigue, loss of appetite, and malaise. Eyes:  Denies blurring and eye irritation. CV:  Denies chest pain or discomfort. Resp:  Denies cough and wheezing. GI:  Denies abdominal pain and nausea. MS:  Denies cramps. Derm:  Complains of itching, lesion(s), and rash; denies poor wound healing. Neuro:  Denies numbness and tremors. Heme:  Denies abnormal bruising and bleeding.  Physical Exam  General:  Well-developed,well-nourished,in no acute distress; alert,appropriate and cooperative throughout examination Head:  normocephalic, atraumatic, and no abnormalities observed.   Eyes:  vision grossly intact, pupils equal, pupils round, and pupils reactive to light.   Neck:  supple with full rom and no masses or thyromegally, no JVD or carotid bruit  Lungs:  Normal respiratory effort, chest expands symmetrically. Lungs are clear to auscultation, no crackles or wheezes. Heart:  Normal rate and regular rhythm. S1 and S2 normal without gallop, murmur, click, rub or other extra sounds. Msk:  no acute joint changes  Skin:  some 1-2 cm areas oval shaped dry lesions on back  no excoriations few  lentigos   no urticaria seen  Cervical Nodes:  No lymphadenopathy noted Inguinal Nodes:  No significant adenopathy Psych:  normal affect, talkative and pleasant    Impression & Recommendations:  Problem # 1:  DERMATITIS, ATOPIC (ICD-691.8) Assessment New  I suspect the intermittent dry spots on back are eczema/ atopic derm handout given from [facility]  use moisturizers occ steroid cream- see below send for derm notes to see what Dr Nicholas Lose thought these were  Her updated medication list for this problem includes:    Locoid Lipocream 0.1 % Crea  (Hydrocortisone butyr lipo base) .Marland Kitchen... Apply to affected areas once daily as needed  Orders: Prescription Created Electronically [phone])  Problem # 2:  URTICARIA DUE TO COLD OR HEAT (ICD-708.2) Assessment: New no findings today but this dx is based on her hx  red splotches after shower that resolve quickly nothing to do - avoid very hot water  no antihist at this time as no itch will keep me updated  f/u if worse or other symptoms handout also given from [facility]  Complete Medication List: 1)  Atenolol 25 Mg Tabs (Atenolol) .... Take one half by mouth every day 2)  Calcium 600 Tabs (Calcium carbonate tabs) .... Take two by mouth daily 3)  5 Htp Over The Counter 100 Mg  .Marland KitchenMarland Kitchen. 1 by mouth once daily 4)  Mvi  .Marland Kitchen.. 1 by mouth once daily 5)  Cranberry Tablets  .Marland KitchenMarland Kitchen. 1 by mouth once daily 6)  Tylenol Pm  .... As needed pain/insomnia at bedtime 7)  Vitamin D 1000 Unit Tabs (Cholecalciferol) .Marland Kitchen.. 1 daily by mouth 8)  Aspirin 81mg   .... 1 by mouth once daily 9)  Acyclovir  .... Takes it twice weekly as needed 10)  Anusol-hc 2.5 % Crea (Hydrocortisone) .... Apply to affected area once daily as needed for hemorroids 11)  Magnesium 250 Mg Tabs (Magnesium) .... One to two tablets by mouth daily 12)  Locoid Lipocream 0.1 % Crea (Hydrocortisone butyr lipo base) .... Apply to affected areas once daily as needed  Patient Instructions: 1)  please request last 3 notes from Dr Nicholas Lose -- dermatology  2)  I think your red spots after a shower are hives -- since they dissapear so quickly  3)  after shower make good effort to get dry and cool off  4)  I'm unsure what the dry spots are -- I suspect eczema -- but not entirely sure  5)  I recommend either lubriderm for sensitive skin or eucerin cream  6)  avoid super hot water  7)  you can continue the locoid lipocream  Prescriptions: LOCOID LIPOCREAM 0.1 % CREA (HYDROCORTISONE BUTYR LIPO BASE) apply to affected areas once daily as needed  #1 small x 1   Entered  and Authorized by:   Judith Part MD   Signed by:   Judith Part MD on 11/27/2010   Method used:   [facility]        [facility]  [address] [facility]ail[facility]       [address]       [place], [place]  [address]       Ph: [phone] or [phone]       Fax: [phone]   RxID:   [id number]    Orders Added: 1)  Prescription Created Electronically [G8553] 2)  Est. Patient Level III [47425]    Current Allergies (reviewed today): CODEINE * TOPROL BIAXIN AMOXICILLIN * ZEGRID

## 2010-12-13 NOTE — Letter (Signed)
Summary: [facility] Records  Dr.Laura Lomax Records   Imported By: Beau Fanny 11/30/2010 15:33:57  _____________________________________________________________________  External Attachment:    Type:   Image     Comment:   External Document

## 2010-12-20 LAB — POCT I-STAT, CHEM 8
Calcium, Ion: 1.16 mmol/L (ref 1.12–1.32)
Chloride: 107 mEq/L (ref 96–112)
Creatinine, Ser: 0.9 mg/dL (ref 0.4–1.2)
Glucose, Bld: 98 mg/dL (ref 70–99)
HCT: 41 % (ref 36.0–46.0)

## 2010-12-20 LAB — DIFFERENTIAL
Basophils Relative: 0 % (ref 0–1)
Lymphs Abs: 3.2 10*3/uL (ref 0.7–4.0)
Monocytes Absolute: 0.5 10*3/uL (ref 0.1–1.0)
Monocytes Relative: 8 % (ref 3–12)
Neutro Abs: 2.6 10*3/uL (ref 1.7–7.7)

## 2010-12-20 LAB — POCT CARDIAC MARKERS
CKMB, poc: <1 ng/mL — ABNORMAL LOW (ref 1.0–8.0)
CKMB, poc: <1 ng/mL — ABNORMAL LOW (ref 1.0–8.0)
Myoglobin, poc: 58.2 ng/mL (ref 12–200)
Troponin i, poc: <0.05 ng/mL (ref 0.00–0.09)
Troponin i, poc: <0.05 ng/mL (ref 0.00–0.09)

## 2010-12-20 LAB — CARDIAC PANEL(CRET KIN+CKTOT+MB+TROPI)
CK, MB: 1 ng/mL (ref 0.3–4.0)
CK, MB: 1.1 ng/mL (ref 0.3–4.0)
Total CK: 84 U/L (ref 7–177)
Total CK: 98 U/L (ref 7–177)
Troponin I: 0.02 ng/mL (ref 0.00–0.06)

## 2010-12-20 LAB — CK TOTAL AND CKMB (NOT AT ARMC)
CK, MB: 1.2 ng/mL (ref 0.3–4.0)
Total CK: 107 U/L (ref 7–177)

## 2010-12-20 LAB — HEPATIC FUNCTION PANEL
ALT: 23 U/L (ref 0–35)
AST: 28 U/L (ref 0–37)
Albumin: 4 g/dL (ref 3.5–5.2)
Bilirubin, Direct: 0.2 mg/dL (ref 0.0–0.3)
Total Protein: 6.4 g/dL (ref 6.0–8.3)

## 2010-12-20 LAB — CBC
HCT: 38.8 % (ref 36.0–46.0)
Hemoglobin: 14 g/dL (ref 12.0–15.0)
MCH: 33.5 pg (ref 26.0–34.0)
MCHC: 36.1 g/dL — ABNORMAL HIGH (ref 30.0–36.0)
RBC: 4.18 MIL/uL (ref 3.87–5.11)

## 2011-01-09 ENCOUNTER — Other Ambulatory Visit: Payer: Self-pay | Admitting: Dermatology

## 2011-01-11 LAB — BASIC METABOLIC PANEL
BUN: 13 mg/dL (ref 6–23)
Creatinine, Ser: 0.81 mg/dL (ref 0.4–1.2)
GFR calc non Af Amer: >60 mL/min (ref >60)
Glucose, Bld: 95 mg/dL (ref 70–99)
Potassium: 3.8 mEq/L (ref 3.5–5.1)

## 2011-01-11 LAB — CBC
HCT: 40.8 % (ref 36.0–46.0)
MCV: 95.7 fL (ref 78.0–100.0)
Platelets: 185 10*3/uL (ref 150–400)
RDW: 13 % (ref 11.5–15.5)
WBC: 5.3 10*3/uL (ref 4.0–10.5)

## 2011-01-11 LAB — DIFFERENTIAL
Basophils Absolute: 0 10*3/uL (ref 0.0–0.1)
Eosinophils Absolute: 0 10*3/uL (ref 0.0–0.7)
Eosinophils Relative: 1 % (ref 0–5)
Lymphocytes Relative: 32 % (ref 12–46)
Lymphs Abs: 1.7 10*3/uL (ref 0.7–4.0)
Neutrophils Relative %: 61 % (ref 43–77)

## 2011-01-11 LAB — POCT CARDIAC MARKERS
Myoglobin, poc: 98.6 ng/mL (ref 12–200)
Troponin i, poc: <0.05 ng/mL (ref 0.00–0.09)

## 2011-01-24 ENCOUNTER — Encounter: Payer: Self-pay | Admitting: Family Medicine

## 2011-01-25 ENCOUNTER — Encounter: Payer: Self-pay | Admitting: Family Medicine

## 2011-01-25 ENCOUNTER — Ambulatory Visit (INDEPENDENT_AMBULATORY_CARE_PROVIDER_SITE_OTHER): Payer: BC Managed Care – PPO | Admitting: Family Medicine

## 2011-01-25 VITALS — BP 116/64 | HR 84 | Temp 98.0°F | Wt 172.0 lb

## 2011-01-25 DIAGNOSIS — J069 Acute upper respiratory infection, unspecified: Secondary | ICD-10-CM

## 2011-01-25 DIAGNOSIS — J029 Acute pharyngitis, unspecified: Secondary | ICD-10-CM

## 2011-01-25 LAB — POCT RAPID STREP A (OFFICE): Rapid Strep A Screen: NEGATIVE

## 2011-01-25 NOTE — Progress Notes (Signed)
1 week on congestion, cough with some sputum, ST.  These sx are getting better but ST persists.  Taking zyrtec and mucinex.  She wanted to make sure she didn't have strep.  L ear popping, cracking, some intermittent discomfort. No FCANVD now.  Initially with some chills.    Meds, vitals, and allergies reviewed.   ROS: See HPI.  Otherwise, noncontributory.  GEN: nad, alert and oriented HEENT: mucous membranes moist, erythema of soft palate noted, no exudates, tm w/o erythema x2 but L TM with limited movement on valsalva, nasal exam w/mild erythema, small adherent scab in L lateral nostril w/o bleeding, clear discharge noted,  OP with cobblestoning NECK: supple w/o LA CV: rrr.   PULM: ctab, no inc wob EXT: no edema SKIN: no acute rash

## 2011-01-25 NOTE — Assessment & Plan Note (Signed)
Likely benign viral process and nontoxic.  Supportive tx.  Should resolve.

## 2011-01-25 NOTE — Patient Instructions (Signed)
Drink plenty of fluids, take tylenol as needed, and gargle with warm salt water for your throat.  This should gradually improve.  Take care.  Let us know if you have other concerns.    

## 2011-01-27 ENCOUNTER — Encounter: Payer: Self-pay | Admitting: Family Medicine

## 2011-02-04 ENCOUNTER — Other Ambulatory Visit: Payer: Self-pay | Admitting: Gynecology

## 2011-02-04 ENCOUNTER — Encounter (INDEPENDENT_AMBULATORY_CARE_PROVIDER_SITE_OTHER): Payer: BC Managed Care – PPO | Admitting: Gynecology

## 2011-02-04 ENCOUNTER — Other Ambulatory Visit (HOSPITAL_COMMUNITY)
Admission: RE | Admit: 2011-02-04 | Discharge: 2011-02-04 | Disposition: A | Discharge status: Home / Self Care | Payer: BC Managed Care – PPO | Source: Ambulatory Visit | Attending: Gynecology | Admitting: Gynecology

## 2011-02-04 DIAGNOSIS — Z124 Encounter for screening for malignant neoplasm of cervix: Secondary | ICD-10-CM | POA: Insufficient documentation

## 2011-02-04 DIAGNOSIS — R823 Hemoglobinuria: Secondary | ICD-10-CM

## 2011-02-04 DIAGNOSIS — Z01419 Encounter for gynecological examination (general) (routine) without abnormal findings: Secondary | ICD-10-CM

## 2011-02-08 ENCOUNTER — Other Ambulatory Visit: Payer: BC Managed Care – PPO

## 2011-02-08 ENCOUNTER — Ambulatory Visit (INDEPENDENT_AMBULATORY_CARE_PROVIDER_SITE_OTHER): Payer: BC Managed Care – PPO | Admitting: Gynecology

## 2011-02-08 DIAGNOSIS — Z8049 Family history of malignant neoplasm of other genital organs: Secondary | ICD-10-CM

## 2011-02-08 DIAGNOSIS — N949 Unspecified condition associated with female genital organs and menstrual cycle: Secondary | ICD-10-CM

## 2011-02-22 NOTE — Assessment & Plan Note (Signed)
[facility]                         GASTROENTEROLOGY OFFICE NOTE   NAME:[name], [name] DEGRAAF                      MRN:          [id number]  DATE:12/03/2006                            DOB:          October 21, 1950    CHIEF COMPLAINT:  Persistent left upper quadrant pain.   [name] [name] returns. I had seen her and performed upper endoscopy, which  was unrevealing. Zegerid did not help, neither did Levsin. She has been  on Nexium with control of her heartburn. She continues to have this left-  sided abdominal pain, intermittent, fluctuates between dull and sharp.  There is no diarrhea or constipation. Because of her scoliosis problems,  she had an MRI of the thoracic spine in January. She did have mild  neuroforaminal narrowing in the thoracic spine from T6 down to T10.  There was no frank protrusion, compression fracture or spinal stenosis.  She saw an orthopedist, cannot remember the name. She ended up going to  a chiropractor. She has seen a chiropractor over two weeks and cannot  tell much difference. She had a CT of the abdomen and pelvis without IV  or oral contrast in September, after I had seen her, because Dr. Milinda [name]  found hematuria in her urine. There were no stones. This was really  unremarkable. 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 The pain is really hard to pin down as to a cause.  It does not seem to wake her up at night. Hyoscyamine helps what she  calls jumping in her small bowel, which is different than the left  upper quadrant pain. When she has problems sometimes she will start to  have spasms of her abdominal area and that is helped by hyoscyamine.  Otherwise, the left upper quadrant pressure phenomena persists.   Weight 166 pounds. Pulse 80. Blood pressure 116/62.  ABDOMEN: Soft and nontender without organomegaly or mass. The ribs are  nontender. The chest wall is nontender. The back is notable for  some  scoliosis. The spine is nontender. There is no CVA tenderness.   ASSESSMENT:  Persistent left upper quadrant pain. There is some pain  that sounds functional in that it responds to hyoscyamine and that  sounds different. I am not sure what is causing her left upper quadrant  pain. I did have her do another urinalysis which showed trace lysed  blood. It was otherwise normal. The pain is not suggestive of any one  process, IE, the character of the pain. She has some spinal canal  changes on the MRI, but they do not sound significant enough to cause  pain and the pain is really not radicular in origin though I have  wondered about that.   PLAN:  A urinalysis was checked. It is not clear what to do with this  mild hematuria. The kidneys looked normal on the unenhanced scan,  question if she needs an ultrasound versus an MR of the kidneys versus  dedicated CT scan with IV and oral contrast. She is not losing weight.  There are not other worrisome features here.  Will discuss with Dr. Milinda [name] and the patient.     Iva Boop, MD,FACG  Electronically Signed    CEG/MedQ  DD: 12/03/2006  DT: 12/03/2006  Job #: [id number]   cc:   [name] A. Milinda Antis, MD

## 2011-02-22 NOTE — Op Note (Signed)
[facility]  Patient:    [name] [name]                       MRN: [id number] Proc. Date: 08/06/99 Adm. Date:  [id number] Attending:  Merrily Pew                           Operative Report  PREOPERATIVE DIAGNOSIS:       Endometrial polyps.  POSTOPERATIVE DIAGNOSIS:      Endometrial polyps.  OPERATION:                    Hysteroscopic endometrial polypectomy, endometrial curettage.  SURGEON:                      Timothy P. Fontaine, M.D.  ASSISTANT:  ANESTHESIA:                   General anesthesia.  ESTIMATED BLOOD LOSS:         Less than 25 cc.  SORBITOL:                     Discrepancy 240 cc recorded on machine with moderate amount of spillage on the floor.  COMPLICATIONS:                None.  SPECIMENS:                    Endometrial polyps.  Endometrial curettage.  FINDINGS:                     Multiple endometrial polyps filling the endometrial cavity.  Status post curettage, polypectomy, cavity empty noting normal fundus,  anterior and posterior uterine surfaces, lower uterine segment, and endocervical canal.  Right and left tubal ostia not clearly visualized post D&C.  DESCRIPTION OF PROCEDURE:     The patient was taken to the operating room and underwent general anesthesia and was placed in the low dorsal lithotomy position and received a perineal and vaginal preparation with Betadine scrub and Betadine solution.  Her bladder was emptied with in-and-out Foley catheterization.  The patient was draped in the usual fashion.  The cervix visualized with a surgical  speculum and grasped with a single tooth tenaculum and gently dilated to admit he resectoscopic hysteroscope.  Initial hysteroscopy was performed noting the unusual cavity filled with multiple endometrial polyps limiting visualization.  The hysteroscope was removed.  A gentle sharp curettage was performed emptying most of the polyps and rehysteroscopy  was performed.  Several small polyps were still connected to the endometrial surface and these were removed with the right angle loupe resectoscope without difficulty.  Hysteroscopy was adequate and was normal status post removal of the polyps.  A gentle sharp curettage was also performed  following the initial emptying of the cavity of the polyps.  There was no evidence of perforation through the case and good uterine distention at the end of the case. The instruments were removed, tenaculum removed, adequate hemostasis visualized. The patient was placed in the supine position, awakened without difficulty, and  taken to the recovery room in good condition having tolerated the procedure well. Sorbitol discrepancy was recorded at 240 cc on the machine, although, there was a moderate amount of spillage on the floor. DD:  08/06/99 TD:  08/07/99 Job: [id number]

## 2011-02-22 NOTE — Assessment & Plan Note (Signed)
 [facility]                              CARDIOLOGY OFFICE NOTE   NAME:[name] [name] MANUKYAN                      MRN:          [id number]  DATE:05/21/2006                            DOB:          07/22/1951    The patient is a lovely 60 year old white female with history of  palpitations.  Seen on May 09, 2006.  Tereso Newcomer and [employer].  She  had some atypical chest pain and palpitations.  2-D echo in the past has  been EF of 45-55%.  She had Cardiolite with EF of 47%.  Most recent Myoview  of May 14, 2006 revealed an EF of 51% with no scar or ischemia.  EF was  thought to be low normal.   The patient is feeling better.  She has some atypical discomfort in the left  lateral rib cage region.  Some tightness in her arms at times.   Recent LDL was 106.  CBC was normal.  Renal profile normal.  TSH 1.1.   MEDICATIONS:  1. Include multivitamin.  2. Caltrate.  3. Aspirin 81.  4. Atenolol 25 daily.   She states that she cannot tolerate Toprol but tolerates the atenolol quite  well.  She does take it on a regular basis.   Blood pressure 108/66, pulse 65, normal sinus rhythm.  __________ JVP is not  elevated.  Carotid pulse palpable and equal without bruits.  LUNGS:  Clear.  CARDIAC:  Exam normal.  EXTREMITIES:  Normal.   IMPRESSION:  As above.   DISPOSITION:  Plan to continue same therapy.  She will return in 12 months  or p.r.n.                              E. Graceann Congress, MD, [facility]    EJL/MedQ  DD:  05/21/2006  DT:  05/21/2006  Job #:  [id number]

## 2011-02-22 NOTE — Op Note (Signed)
[facility]  Patient:    [name] [name], [name] [name]                      MRN: [id number] Proc. Date: 12/06/99 Adm. Date:  [id number] Attending:  Merrily Pew                           Operative Report  PREOPERATIVE DIAGNOSIS:  Uterine endometrial hyperplasia.  Family history of uterine cancer.  POSTOPERATIVE DIAGNOSIS:  Uterine endometrial hyperplasia.  Family history of uterine cancer.  OPERATION:  Total vaginal hysterectomy.  SURGEON:  Timothy P. Fontaine, M.D.  ASSISTANTGaetano Hawthorne. Lily Peer, M.D.  ANESTHESIA:  General endotracheal.  ESTIMATED BLOOD LOSS:  Minimal.  Less than 100 cc.  COMPLICATIONS:  None.  SPECIMENS:  Uterus.  FINDINGS:  Uterus grossly normal.  Right and left ovaries grossly normal. Right and left fallopian tubes grossly normal.  DESCRIPTION OF PROCEDURE:  The patient underwent general endotracheal and was placed in the dorsal lithotomy position.  She received a perineal vaginal preparation with Betadine scrub and Betadine solution.  Bladder emptied with in and out Foley catheterization and the patient was draped in the usual fashion.  The  cervix was visualized, grasped with a single-toothed tenaculum and circumferentially injected submucosally using Xylocaine with epinephrine mixture. The cervical mucosa was then sharply incised circumferentially and the planes were developed sharply with the Mayo scissors.  The anterior posterior planes were developed and the posterior cul-de-sac was then sharply entered without difficulty and a long weighted speculum placed.  The right and left uterosacral ligaments ere then clamped, cut and ligated using 0 Vicryl suture and tagged for future reference.  The uterus was then progressively freed from its attachments through clamping, cutting and ligating the paramedial tissues and cardinal ligaments and the bladder flap was progressively advanced.  The uterine vessels  were separately identified and doubly ligated using 0 Vicryl suture.  The uterus was then delivered through the vagina and the anterior cul-de-sac was probed to tent up the anterior peritoneum which was sharply incised and the anterior cul-de-sac entered.  The utero-ovarian pedicles were then doubly clamped bilaterally and the uterus was excised from the patient.  The ovarian pedicles were then all ligated doubly using 0 Vicryl suture and tagged for future reference.  Examination of the ovaries were normal.  The right ovary did have an oozing site at the capsule consistent with a rupture of a classic appearing corpus luteal cyst and this area was oversewn using 2-0 Vicryl in a running interlocking stitch to achieve hemostasis.  The bowel was packed using packing from the operative field and the long speculum placed with the short weighted speculum and the posterior vaginal cuff using 0 Vicryl suture in a running interlocking stitch from the uterosacral ligament to uterosacral ligament. The pelvis was irrigated.  Adequate hemostasis visualized.  The sponge removed. A 2-0 Vicryl modified McCall stitch was placed and the vagina was subsequently closed anterior to posterior using interrupted 0 Vicryl suture in figure-of-eight stitch. The McCall stitch was tied in place, the vagina copiously irrigated.  Adequate hemostasis visualized.  A Foley catheter placed.  Free-flowing clear yellow urine noted.  The patient placed in supine position, awakened without difficulty and taken to recovery in good condition, having tolerated the procedure well. DD:  12/06/99 TD:  12/06/99 Job: [id number]

## 2011-02-22 NOTE — Assessment & Plan Note (Signed)
[facility]                              CARDIOLOGY OFFICE NOTE   NAME:[name] [name]                      MRN:          [id number]  DATE:05/09/2006                            DOB:          [birth year]    SUBJECTIVE:  [name] [name] is a very pleasant is a very pleasant 60 year old  female patient with a history of palpitations.  She has been evaluated in  the past by [name] [name].  She did have event monitor back in 2005 that  showed normal sinus rhythm with rare PVCs and a 5-beat run of SVT x2.  She  did have stress Cardiolite study done in April of 2005.  There was no  evidence of ischemia or infarction, the EF was 47% and the wall motion was  normal.  She had an echocardiogram in May of 2005 that revealed an EF of 45-  55%.  The patient presents to the office today with complaints of chest  discomfort.  This occurred last week while she was working.  She works  Education officer, environmental houses.  She was not doing anything particularly exertional.  It  was a sharp type pain in her left chest.  She has also noted some discomfort  in her left arm that she describes mainly as a heaviness more than pain.  She felt a little bit short of breath with this.  The pain would wax and  wane over a couple of minutes to several minutes.  She denied any syncope.  She did feel a little lightheaded.  She denies any nausea or diaphoresis.  She denies any orthopnea or paroxysmal nocturnal dyspnea.  She does  occasionally still have palpitations.   PAST MEDICAL HISTORY:  1.  She denies any history of hypertension, hyperlipidemia or diabetes      mellitus.  2.  She does have a history of gastroesophageal reflux disease and is      supposed to be on Prilosec.  3.  She recently started on herbal supplement to help lose weight.   MEDICATIONS:  1.  Caltrate 600 mg daily.  2.  Atenolol 25 mg 1/2 tablet daily.  3.  Vitamin C.  4.  Aspirin 81 mg daily.  5.  Prilosec OTC - she is currently  not taking the Vitamin B, Vitamin D,      magnesium fish oil, cinnamon, cranberry daily.   ALLERGIES:  1.  AMOXICILLIN.  2.  CODEINE.   SOCIAL HISTORY:  The patient has a 10-12 pack year history of smoking.  She  quit 28 years ago.  She denies any alcohol abuse.  She works as a  Advertising copywriter.   FAMILY HISTORY:  Significant for coronary disease in her father who had a  myocardial infarction in his early 78's.  Otherwise, there is no premature  coronary disease.   REVIEW OF SYSTEMS:  Please see HPI.  She denies any fevers, chills, melena,  hematochezia, hematuria or dysuria.  She does have frequent indigestion,  sometimes numbering 3 or more times per week.  She does have a dry cough.  The rest of  the review of systems are negative.   PHYSICAL EXAM:  She is a well-nourished, well-developed female.  She has  blood pressure of 108/77, pulse 57, weight 160 pounds.  HEENT:  Unremarkable.  NECK:  Without JVD.  CARDIAC:  Normal S1, S2.  Regular rate and rhythm without murmurs, clicks,  rubs or gallops.  LUNGS:  Clear to auscultation bilaterally without wheezing, rhonchi or  rales.  ABDOMEN:  Soft, nontender without organomegaly.  EXTREMITIES:  Without edema.  Dorsalis pedis and posterior tibialis pulses  are 2+ bilaterally.  NEUROLOGIC:  Nonfocal.  SKIN:  Warm and dry.   DATABASE:  She had carotid Dopplers done November of 2006 that showed normal  carotid arteries bilaterally.  Electrocardiogram reveals sinus bradycardia  with a heart rate of 57, rightward axis, no ST-T wave changes to suggest  ischemia, no significant change from previous tracings.   IMPRESSION:  1.  Chest pain.  2.  Palpitations.  3.  Ejection fraction 45-55% by echocardiogram May of 2005.  4.  Gastroesophageal reflux disease.   PLAN:  The patient presented to the office today with complaints of chest  discomfort.  This has occurred three times since last week.  She denied any  symptoms until two days ago.   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  She has not had any symptoms when she is doing heavy  exertion.  Her symptoms are quite atypical.  She does relate some discomfort  in her left arm.  In looking through her records, she had similar symptoms  back in 2005 and had a nonischemic Cardiolite at that time.  The patient is  no longer taking her Prilosec.  She does have a significant amount of  indigestion per week.  I have encouraged her to get back on the Prilosec  daily.  Her risk factors for coronary disease are low but significant for  family history and previous history of smoking.  She currently has no  objective evidence of ischemia.  In addition, she had carotid Dopplers  performed in October, 2006, that showed no evidence of plaque build up.  I  think at this point in time it is important we go ahead and further evaluate  her with a stress Myoview study.  I will try to set that up in the next few  days.  She is to  continue on aspirin and beta-blocker.  She knows if she has any recurrent  symptoms she should go to the emergency room.  She will follow with Dr.  Corinda [name] after her stress test is completed.                                  Tereso Newcomer, PA-C                            [name] [name], M.D.   SW/MedQ  DD:  05/09/2006  DT:  05/09/2006  Job #:  [id number]   cc:   [name] A. Milinda Antis, MD

## 2011-02-22 NOTE — Discharge Summary (Signed)
[facility]  Patient:    [name] [name], [name] [name]                      MRN: [id number] Adm. Date:  [id number] Disc. Date: [id number] Attending:  Merrily Pew Dictator:   Antony Contras, RNC, [facility]                           Discharge Summary  DISCHARGE DIAGNOSES:          1. Endometrial hyperplasia.                               2. Total vaginal hysterectomy.  HISTORY OF PRESENT ILLNESS:   This is a 60 year old, gravida 3, para 3, female ho has had a tubal sterilization.  She has had an ultrasound which showed a simple  right ovarian cyst as well as a thickened endometrial cavity with questionable endometrial polyp and she underwent hysteroscopy in October of 2000, which showed some simple hyperplasia without atypia.  She does have maternal history of uterine cancer at a young age.  ALLERGIES:                    No known drug allergies.  MEDICATIONS:                  1. Vitamin supplementation.                               2. Zantac p.r.n.  PAST SURGICAL HISTORY:        Laparoscopic tubal sterilization, orthopedic foot  surgery, melanoma excision, tonsillectomy, hysteroscopy, and dilatation and curettage.  HOSPITAL COURSE:              She was admitted for total vaginal hysterectomy. The patient was taken to the operating room where total vaginal hysterectomy was performed under general anesthesia by Timothy P. Fontaine, M.D. and assisted by  Gaetano Hawthorne. Lily Peer, M.D.  Estimated blood loss was less than 100 cc.  The patient tolerated the procedure well.  Postoperatively, she remained afebrile.  Her Foley was discontinued on her first postoperative day and she was able to be discharged in satisfactory condition.  Postoperative CBC; hematocrit 37, hemoglobin 12.9, BC 8.6, platelets 151.  DISPOSITION:                  The patient is to be followed up in the office in two weeks for postoperative examination.  She was given Tylox 20 for  pain. DD:  12/24/99 TD:  12/24/99 Job: [id number]

## 2011-02-22 NOTE — Assessment & Plan Note (Signed)
[facility]                           GASTROENTEROLOGY OFFICE NOTE   NAME:[name], [name] DESHMUKH                      MRN:          [id number]  DATE:06/19/2006                            DOB:          Oct 19, 1950    REFERRING PHYSICIAN:  Marne A. Tower, MD   REASON FOR CONSULTATION:  Abdominal pain, left side.   ASSESSMENT:  Chronic left upper quadrant pain of unclear etiology.  There is  a pressure phenomenon that is worse when she moves.  She has a background  history of indigestion for years, helped by proton pump inhibitors, though  this pain is not helped by Nexium.  Functional dyspepsia is possible.  She  has had thoracic spine films which have shown scoliosis.  It does not really  sound radicular.  Ulcer disease or gastrointestinal tumor are possible, as  well, though seem less likely.   PLAN:  Investigate with an upper GI endoscopy.  If that is unrevealing, I am  not certain what the next step should be.  We are going to try Levsin-SL  intermittently for the time being and stop her Nexium, as that is not  helping, and occasionally Nexium can cause abdominal pain, though I know  these symptoms started before her Nexium.  I have explained the risks,  benefits and indications of upper GI endoscopy; she understands and agrees  to proceed.   She had a colonoscopy last year, I do not think we need to repeat that,  though splenic flexure problems could cause pain.  A CT scan could be  indicated.   HISTORY:  This is a pleasant 60 year old white woman I know from previous  direct screening colonoscopy.  She has had these symptoms where she feels  pressure in the left upper quadrant sometimes after eating, but not always,  worse at night when she lies on her right side.  It feels as if something is  pushing and moving in there.  She tried Nexium without significant benefit.  In the past, Nexium and other proton pump inhibitors have helped her  heartburn symptoms, but these do not seem to be terribly active at this  time.  There is no dysphagia or bleeding.  Her colonoscopy was unrevealing  in 2006, showing external hemorrhoids only.  The pain is worse as the day  goes on.  She does have a lot of gas, bloating and pressure symptomatology.  Thoracic spine film showed scoliosis and associated degenerative disk  disease but no acute bony findings.  She had no acute cardiopulmonary  findings on a chest x-ray.  She has previously seen a chiropractor, but  stopped that when her insurance quit paying.  She has been using some apple  cider vinegar supplementation for weight loss, and wonders if that may be  part of the problem.   PAST MEDICAL HISTORY:  1. Colonoscopy, as above.  2. Palpitations, seen by Dr. Graceann Congress in the past.  3. Clinical diagnosis of gastroesophageal reflux disease.  4. Complains of sleep apnea.   PAST SURGICAL HISTORY:  No surgeries are reported except for a hysterectomy  in the past and skin cancer.   FAMILY HISTORY:  My review is negative; no colon cancer.   SOCIAL HISTORY:  She is married.  She works Education officer, environmental houses.  Does not  remember any injuries.  Has 2 sons and 1 daughter.  No alcohol, tobacco or  drugs.   REVIEW OF SYSTEMS:  See my medical history form.  There is some chronic back  pain, which is lower than this area, it seems.   PHYSICAL EXAMINATION:  GENERAL:  Reveals a well-developed, well-nourished,  overweight white woman.  VITAL SIGNS:  Height is 5 feet 7 inches and weight is 162 pounds.  Blood  pressure is 102/64 and pulse is 72.  HEENT:  Eyes are anicteric.  ENT is normal.  Mouth and pharynx moist.  NECK:  Supple without thyromegaly or mass.  CHEST:  Clear.  HEART:  S1 and S2.  No murmurs, gallops or rubs.  ABDOMEN:  She is mildly tender in the left lower quadrant area without  organomegaly or mass.  RECTAL:  Exam is not performed.  EXTREMITIES:  No edema.  MUSCULOSKELETAL:  The  ribs are nontender.  The back is nontender.  There is  no CVA tenderness.  SKIN:  No acute rash.  NEUROLOGIC/PSYCHIATRIC:  She is alert and oriented times three.   I have reviewed Dr. Royden Purl recent office notes and the x-ray reports.  Urine culture did grow staph aureus recently.  She had been given Cipro  empirically for that.   I appreciate the opportunity to care for this patient.                                   Iva Boop, MD,FACG   CEG/MedQ  DD:  06/19/2006  DT:  06/20/2006  Job #:  [id number]   cc:   Marne A. Milinda Antis, MD

## 2011-03-18 ENCOUNTER — Other Ambulatory Visit: Payer: Self-pay | Admitting: *Deleted

## 2011-03-18 NOTE — Telephone Encounter (Signed)
That is puzzling- please check in with pt and see what the deal is-thanks

## 2011-03-18 NOTE — Telephone Encounter (Signed)
Refill request from [facility] for nexium 40 mg's.  This is no longer on med list, taken off 12/11 for being uneffective.

## 2011-03-19 MED ORDER — ESOMEPRAZOLE MAGNESIUM 40 MG PO CPDR: 40.0000 mg | DELAYED_RELEASE_CAPSULE | Freq: Every day | ORAL | Status: DC

## 2011-03-19 NOTE — Telephone Encounter (Signed)
Pt has had some indigestion recently and OTC med was not helping so pt started back on Nexium 40mg  taking 1 daily about 2 weeks ago and it is helping symptoms this time. Pt will ck with pharmacy later to see if med called in.

## 2011-03-19 NOTE — Telephone Encounter (Signed)
Px written for call in  For nexium

## 2011-03-19 NOTE — Telephone Encounter (Signed)
Medication phoned to [facility] pharmacy as instructed. Pt will ck with pharmacy later today.

## 2011-05-16 ENCOUNTER — Ambulatory Visit (INDEPENDENT_AMBULATORY_CARE_PROVIDER_SITE_OTHER): Payer: BC Managed Care – PPO | Admitting: Family Medicine

## 2011-05-16 ENCOUNTER — Encounter: Payer: Self-pay | Admitting: Family Medicine

## 2011-05-16 VITALS — BP 100/70 | HR 59 | Temp 97.5°F | Ht 66.5 in | Wt 173.1 lb

## 2011-05-16 DIAGNOSIS — M79605 Pain in left leg: Secondary | ICD-10-CM

## 2011-05-16 DIAGNOSIS — M722 Plantar fascial fibromatosis: Secondary | ICD-10-CM

## 2011-05-16 DIAGNOSIS — M79609 Pain in unspecified limb: Secondary | ICD-10-CM

## 2011-05-16 NOTE — Patient Instructions (Signed)

## 2011-05-16 NOTE — Progress Notes (Signed)
  Subjective:    Patient ID: [name] [name], female    DOB: May 20, 1951, 60 y.o.   MRN: [id number]  HPI  [name] [name], a 60 y.o. female presents today in the office for the following:    Left leg was working and cleaning and doing normal stuff. Pain posteriorly with a fullness in her popliteal fossa. No occult injury that she can think of. No recent long travel or immob. No prior clots. Recently had a knee injection at [facility].   Also with R heel pain: 3-4 weeks The patient presents with a 3-4 long history of heel pain. This is notable for worsening pain first thing in the morning when arising and standing after sitting.   Orthotics or bracing: OTC Medications: none PT or home rehab: none Night splints: no Ice massage: no Ball massage: no  Metatarsal pain: no  The PMH, PSH, Social History, Family History, Medications, and allergies have been reviewed in [facility], and have been updated if relevant.  Review of Systems REVIEW OF SYSTEMS  GEN: No fevers, chills. Nontoxic. Primarily MSK c/o today. MSK: Detailed in the HPI GI: tolerating PO intake without difficulty Neuro: No numbness, parasthesias, or tingling associated. Otherwise the pertinent positives of the ROS are noted above.      Objective:   Physical Exam   Physical Exam  Blood pressure 100/70, pulse 59, temperature 97.5 F (36.4 C), temperature source Oral, height 5' 6.5" (1.689 m), weight 173 lb 1.9 oz (78.527 kg), SpO2 97.00%.  GEN: Well-developed,well-nourished,in no acute distress; alert,appropriate and cooperative throughout examination HEENT: Normocephalic and atraumatic without obvious abnormalities. Ears, externally no deformities PULM: Breathing comfortably in no respiratory distress EXT: No clubbing, cyanosis, or edema PSYCH: Normally interactive. Cooperative during the interview. Pleasant. Friendly and conversant. Not anxious or depressed appearing. Normal, full affect.  L knee: Full ROM,  pain in popliteal fossa, pain with calf compression. Stable MCL, LCL, ACL, PCL. NT with patellar movement.  Echymosis: no Edema: no ROM: full LE B Gait: heel toe, non-antalgic MT pain: no Callus pattern: none Lateral Mall: NT Medial Mall: NT Talus: NT Navicular: NT Calcaneous: NT Metatarsals: NT 5th MT: NT Phalanges: NT Achilles: NT Plantar Fascia: tender, medial along PF. Pain with forced dorsi Fat Pad: NT Peroneals: NT Post Tib: NT Great Toe: Nml motion Ant Drawer: neg Other foot breakdown: none Long arch: preserved, pes cavus Transverse arch: preserved Hindfoot breakdown: none Sensation: intact       Assessment & Plan:   1. Left leg pain  Lower Extremity Venous Reflux Left  2. Plantar fascia syndrome     Most likely Baker's cyst. With pain in calf with compression and popliteal pain, will check a doppler   Reviewed basic PF care Arch strap

## 2011-05-16 NOTE — Progress Notes (Signed)
Addended by: Hannah Beat on: 05/16/2011 04:16 PM   Modules accepted: Orders

## 2011-05-17 ENCOUNTER — Ambulatory Visit
Admission: RE | Admit: 2011-05-17 | Discharge: 2011-05-17 | Disposition: A | Discharge status: Home / Self Care | Payer: BC Managed Care – PPO | Source: Ambulatory Visit | Attending: Family Medicine | Admitting: Family Medicine

## 2011-05-17 DIAGNOSIS — M79605 Pain in left leg: Secondary | ICD-10-CM

## 2011-08-31 ENCOUNTER — Other Ambulatory Visit: Payer: Self-pay | Admitting: Orthopedic Surgery

## 2011-09-16 ENCOUNTER — Telehealth: Payer: Self-pay | Admitting: Family Medicine

## 2011-09-16 DIAGNOSIS — M899 Disorder of bone, unspecified: Secondary | ICD-10-CM

## 2011-09-16 DIAGNOSIS — E559 Vitamin D deficiency, unspecified: Secondary | ICD-10-CM

## 2011-09-16 DIAGNOSIS — M949 Disorder of cartilage, unspecified: Secondary | ICD-10-CM

## 2011-09-16 DIAGNOSIS — Z0000 Encounter for general adult medical examination without abnormal findings: Secondary | ICD-10-CM

## 2011-09-16 DIAGNOSIS — E7800 Pure hypercholesterolemia, unspecified: Secondary | ICD-10-CM

## 2011-09-16 NOTE — Telephone Encounter (Signed)
Message copied by Judy Pimple on Mon Sep 16, 2011  8:26 AM ------      Message from: Alvina Chou      Created: Tue Sep 10, 2011 11:54 AM      Regarding: orders for 09-17-11       Patient is scheduled for CPX labs, please order future labs, Thanks , Camelia Eng

## 2011-09-17 ENCOUNTER — Other Ambulatory Visit (INDEPENDENT_AMBULATORY_CARE_PROVIDER_SITE_OTHER): Payer: BC Managed Care – PPO

## 2011-09-17 DIAGNOSIS — M899 Disorder of bone, unspecified: Secondary | ICD-10-CM

## 2011-09-17 DIAGNOSIS — E7800 Pure hypercholesterolemia, unspecified: Secondary | ICD-10-CM

## 2011-09-17 DIAGNOSIS — E559 Vitamin D deficiency, unspecified: Secondary | ICD-10-CM

## 2011-09-17 DIAGNOSIS — Z0000 Encounter for general adult medical examination without abnormal findings: Secondary | ICD-10-CM

## 2011-09-17 LAB — COMPREHENSIVE METABOLIC PANEL
Albumin: 4 g/dL (ref 3.5–5.2)
Alkaline Phosphatase: 66 U/L (ref 39–117)
BUN: 14 mg/dL (ref 6–23)
Calcium: 9.3 mg/dL (ref 8.4–10.5)
Chloride: 105 mEq/L (ref 96–112)
Glucose, Bld: 88 mg/dL (ref 70–99)
Potassium: 3.8 mEq/L (ref 3.5–5.1)

## 2011-09-17 LAB — CBC WITH DIFFERENTIAL/PLATELET
Basophils Relative: 0.4 % (ref 0.0–3.0)
Eosinophils Relative: 1.5 % (ref 0.0–5.0)
Hemoglobin: 14.3 g/dL (ref 12.0–15.0)
MCV: 95.5 fl (ref 78.0–100.0)
Monocytes Absolute: 0.3 10*3/uL (ref 0.1–1.0)
Neutrophils Relative %: 55.3 % (ref 43.0–77.0)
RBC: 4.23 Mil/uL (ref 3.87–5.11)
WBC: 4.9 10*3/uL (ref 4.5–10.5)

## 2011-09-17 LAB — LIPID PANEL
Cholesterol: 181 mg/dL (ref 0–200)
Triglycerides: 81 mg/dL (ref 0.0–149.0)

## 2011-09-20 ENCOUNTER — Encounter: Payer: Self-pay | Admitting: Family Medicine

## 2011-09-20 ENCOUNTER — Ambulatory Visit (INDEPENDENT_AMBULATORY_CARE_PROVIDER_SITE_OTHER): Payer: BC Managed Care – PPO | Admitting: Family Medicine

## 2011-09-20 VITALS — BP 106/78 | HR 73 | Temp 97.5°F | Ht 66.0 in | Wt 177.0 lb

## 2011-09-20 DIAGNOSIS — M949 Disorder of cartilage, unspecified: Secondary | ICD-10-CM

## 2011-09-20 DIAGNOSIS — E7800 Pure hypercholesterolemia, unspecified: Secondary | ICD-10-CM

## 2011-09-20 DIAGNOSIS — E559 Vitamin D deficiency, unspecified: Secondary | ICD-10-CM

## 2011-09-20 DIAGNOSIS — Z0000 Encounter for general adult medical examination without abnormal findings: Secondary | ICD-10-CM

## 2011-09-20 DIAGNOSIS — M899 Disorder of bone, unspecified: Secondary | ICD-10-CM

## 2011-09-20 MED ORDER — ESOMEPRAZOLE MAGNESIUM 40 MG PO CPDR: 40.0000 mg | DELAYED_RELEASE_CAPSULE | Freq: Every day | ORAL | Status: DC

## 2011-09-20 MED ORDER — ACYCLOVIR 200 MG PO CAPS: 200.0000 mg | ORAL_CAPSULE | Freq: Two times a day (BID) | ORAL | Status: DC

## 2011-09-20 MED ORDER — ACYCLOVIR 5 % EX CREA: 1.0000 "application " | TOPICAL_CREAM | CUTANEOUS | Status: DC

## 2011-09-20 NOTE — Patient Instructions (Signed)
Flu shot today  Please send to Dr Reynold Bowen office for last dexa report Avoid red meat/ fried foods/ egg yolks/ fatty breakfast meats/ butter, cheese and high fat dairy/ and shellfish   If you are interested in shingles vaccine in future - call your insurance company to see how coverage is and call [place] to schedule -- after your 61th birthday  Labs look really good  Dr Patsy Lager is our sports med doctor if you ever want to see him for your feet

## 2011-09-20 NOTE — Assessment & Plan Note (Signed)
Controlling well with diet Disc goals for lipids and reasons to control them Rev labs with pt Rev low sat fat diet in detail

## 2011-09-20 NOTE — Assessment & Plan Note (Signed)
Reviewed health habits including diet and exercise and skin cancer prevention Also reviewed health mt list, fam hx and immunizations  Rev wellness labs in detail Flu shot today 

## 2011-09-20 NOTE — Progress Notes (Signed)
Subjective:    Patient ID: [name] [name], female    DOB: 1951-03-25, 60 y.o.   MRN: [id number]  HPI Here for health maintenance exam and to review chronic medical problems    Has had plantar fasciitis - is severe - for 6 months  Ortho gave her some orthotics  Getting knee operated on jan for torn meniscus - arthroscopic - also has a baker's cyst  On her feet a lot     bp good 106/78 Wt is up 4 lb with bmi of 28 Is very active - but not as much since her feet hurt  Is not eating any differently    Flu shot- has not had a flu shot  Will do that today   Mam 9/11-has not had mammogram yet this year - she will make her own appt  Self exam - no lumps or problems   Pap 4/12 normal - goes to Dr Audie Box   Vit D 43-good Osteopenia dexa 04 very mild at hip T -1.0-- but then had one in ? 2012 with gyn -- said about the same  Ca and D - good about that    Lipids pretty good Lab Results  Component Value Date   CHOL 181 09/17/2011   HDL 52.40 09/17/2011   LDLCALC 112* 09/17/2011   LDLDIRECT 124.4 09/12/2010   TRIG 81.0 09/17/2011   CHOLHDL 3 09/17/2011    Lab Results  Component Value Date   CHOL 181 09/17/2011   CHOL 205* 09/12/2010   CHOL 182 04/21/2009   Lab Results  Component Value Date   HDL 52.40 09/17/2011   HDL 48.00 09/12/2010   HDL 40.98 04/21/2009   Lab Results  Component Value Date   LDLCALC 112* 09/17/2011   LDLCALC 111* 04/21/2009   LDLCALC 91 03/14/2008   Lab Results  Component Value Date   TRIG 81.0 09/17/2011   TRIG 119.0 09/12/2010   TRIG 110.0 04/21/2009   Lab Results  Component Value Date   CHOLHDL 3 09/17/2011   CHOLHDL 4 09/12/2010   CHOLHDL 4 04/21/2009   Lab Results  Component Value Date   LDLDIRECT 124.4 09/12/2010    Overall doing well   Patient Active Problem List  Diagnoses  . GENITAL HERPES- BREAKOUT BUTTOCK  . UNSPECIFIED VITAMIN D DEFICIENCY  . HYPERCHOLESTEROLEMIA, PURE  . EXTERNAL HEMORRHOIDS WITHOUT MENTION COMP  . OTHER  CHRONIC SINUSITIS  . ALLERGIC RHINITIS  . GERD  . HIATAL HERNIA  . OSTEOPENIA  . FATIGUE, CHRONIC  . ABNORMAL EKG  . STRESS REACTION, ACUTE, WITH EMOTIONAL DISTURBANCE  . THROAT PAIN, CHRONIC  . DERMATITIS, ATOPIC  . URTICARIA DUE TO COLD OR HEAT  . Routine general medical examination at a health care facility   Past Medical History  Diagnosis Date  . Melanoma of skin   . Basal cell cancer 5/08  . Chronic abdominal pain     Left / CW pain  . Hyperlipidemia   . GERD (gastroesophageal reflux disease)     HH  . HSV-2 infection   . SVT (supraventricular tachycardia)   . Chronic fatigue   . Eczema   . Osteopenia   . Chest pain 9/11    [facility]) and palpitations - ruled out for MI with nl. Echo   Past Surgical History  Procedure Date  . Abdominal hysterectomy   . Mri neck 3/00    C4-C5 herniation small stenosis mild C4-5, C5-6  . Gyn surgery 10/00    Uterine mass (not  malignant)  . Melanoma excision   . Stress cardiolite 4/05    Neg EF 47%  . Carotid US 9/05    Mild, recheck 1 year  . Carotid US 10/06    Normal  . Btl   . Nuclear stress test 8/07    Negative  . Esophagogastroduodenoscopy 9/07    Normal  . Ct abdomen and pelvis 9/07    (No contrast) Negative, no stones  . Abnormal mole removed from leg - ? basal cell    History  Substance Use Topics  . Smoking status: Former Smoker    Types: Cigarettes    Quit date: 10/07/1980  . Smokeless tobacco: Never Used  . Alcohol Use: No   Family History  Problem Relation Age of Onset  . Hypertension Mother   . Cancer Mother     uterine  . Obesity Mother   . Thrombocytopenia Mother   . Alcohol abuse Father   . Heart disease Father     CAD in 9's  . Heart disease Paternal Aunt     CAD  . Heart disease Paternal Uncle     CAD  . Colon cancer Neg Hx    Allergies  Allergen Reactions  . Amoxicillin Rash  . Codeine Rash  . Clarithromycin     REACTION: reaction not known   Current Outpatient Prescriptions on  File Prior to Visit  Medication Sig Dispense Refill  . 5-Hydroxytryptophan (5-HTP) 100 MG CAPS Take 1 capsule by mouth daily.        Marland Kitchen aspirin 81 MG tablet Take 81 mg by mouth daily.        Marland Kitchen atenolol (TENORMIN) 25 MG tablet Take 1/2 tablet by mouth every day.       . calcium carbonate (OS-CAL) 600 MG TABS Take 600 mg by mouth 2 (two) times daily with a meal.        . Cholecalciferol (VITAMIN D) 1000 UNITS capsule Take 1,000 Units by mouth daily.        . Cranberry 1000 MG CAPS ? Strength.  Take one by mouth daily.       . diphenhydramine-acetaminophen (TYLENOL PM) 25-500 MG TABS Take 1 tablet by mouth at bedtime as needed.        . hydrocortisone (ANUSOL-HC) 2.5 % rectal cream Apply to affected area once daily as needed for hemorrhoids.       . Hydrocortisone Butyr Lipo Base (LOCOID LIPOCREAM) 0.1 % CREA Apply to affected areas once daily as needed.       . Magnesium 250 MG TABS Take 1 or 2 tablets by mouth daily.       . Multiple Vitamin (MULTIVITAMIN) tablet Take 1 tablet by mouth daily.            Review of Systems Review of Systems  Constitutional: Negative for fever, appetite change,  and unexpected weight change pos for fatigue .  Eyes: Negative for pain and visual disturbance.  Respiratory: Negative for cough and shortness of breath.   Cardiovascular: Negative for cp or palpitations    Gastrointestinal: Negative for nausea, diarrhea and constipation.  Genitourinary: Negative for urgency and frequency.  Skin: Negative for pallor or rash   Neurological: Negative for weakness, light-headedness, numbness and headaches.  MSK pos for foot pain  Hematological: Negative for adenopathy. Does not bruise/bleed easily.  Psychiatric/Behavioral: Negative for dysphoric mood. The patient is not nervous/anxious.          Objective:   Physical Exam  Constitutional:  She appears well-developed and well-nourished. No distress.  HENT:  Head: Normocephalic and atraumatic.  Right Ear: External  ear normal.  Left Ear: External ear normal.  Nose: Nose normal.  Mouth/Throat: Oropharynx is clear and moist.  Eyes: Conjunctivae and EOM are normal. Pupils are equal, round, and reactive to light.  Neck: Normal range of motion. Neck supple. No JVD present. Carotid bruit is not present. Erythema present. No thyromegaly present.  Cardiovascular: Normal rate, regular rhythm, normal heart sounds and intact distal pulses.  Exam reveals no gallop.   Pulmonary/Chest: Effort normal and breath sounds normal. No respiratory distress. She has no wheezes. She exhibits no tenderness.  Abdominal: Soft. Bowel sounds are normal. She exhibits no distension and no mass. There is no tenderness.  Musculoskeletal: Normal range of motion. She exhibits no edema and no tenderness.  Lymphadenopathy:    She has no cervical adenopathy.  Neurological: She has normal reflexes. No cranial nerve deficit. She exhibits normal muscle tone. Coordination normal.  Skin: Skin is warm and dry. No rash noted. No erythema. No pallor.  Psychiatric: She has a normal mood and affect.          Assessment & Plan:

## 2011-09-20 NOTE — Assessment & Plan Note (Signed)
level43 on current supplementation- doing better

## 2011-09-20 NOTE — Assessment & Plan Note (Signed)
Per pt - still very mild and watched by gyn Has had dexa within the past few years Nl vit D level  Good exercise and supplements

## 2011-09-24 IMAGING — CT CT ABD-PELV W/ CM
2 of 5 series · 17 of 46 positions shown, 19 images · IV contrast (Omnipaque 300)
Comparison: Radiographs 10/29/2010.  CT 08/31/2008.

CLINICAL DATA: Diffuse abdominal pain (especially in the suprapubic
and right lower quadrant areas).  Constipation.

CT ABDOMEN AND PELVIS WITH CONTRAST
TECHNIQUE: Multidetector CT imaging of the abdomen and pelvis was
performed following the standard protocol during bolus
administration of intravenous contrast.
Contrast: 100 ml Nmnipaque-OVV intravenously.

[Series 2: abd/ pel 5mm · axial · 0.69mm/px · z∈[-493,-108]mm · 14 of 87 slices shown, 16 images]
[im 5/87  soft-tissue]
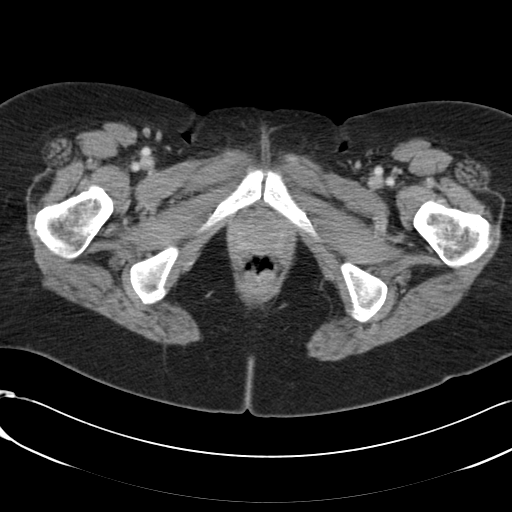
[im 5/87  bone]
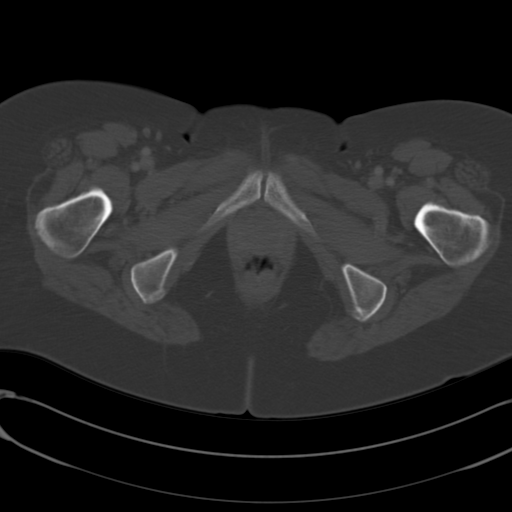
[im 13/87  soft-tissue]
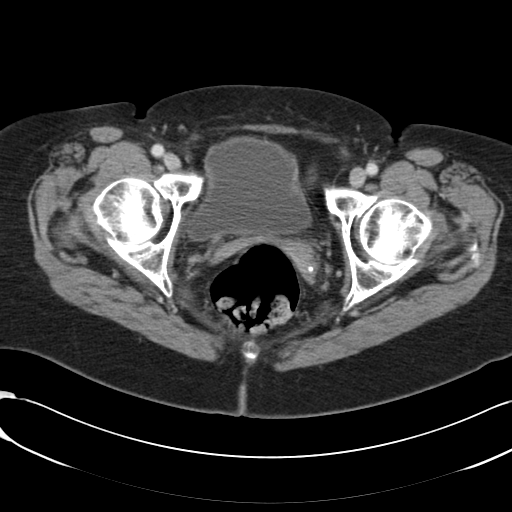
[im 18/87  soft-tissue]
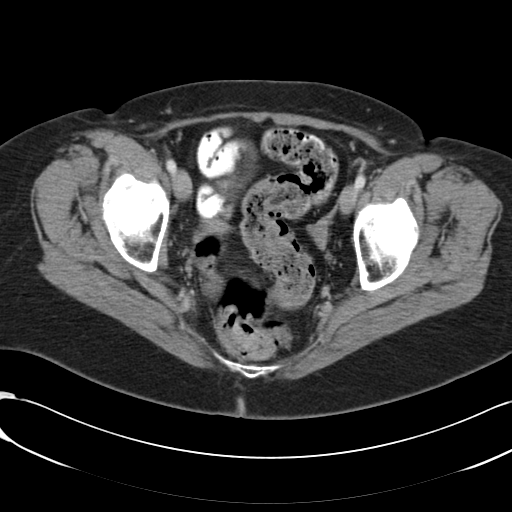
[im 22/87  soft-tissue]
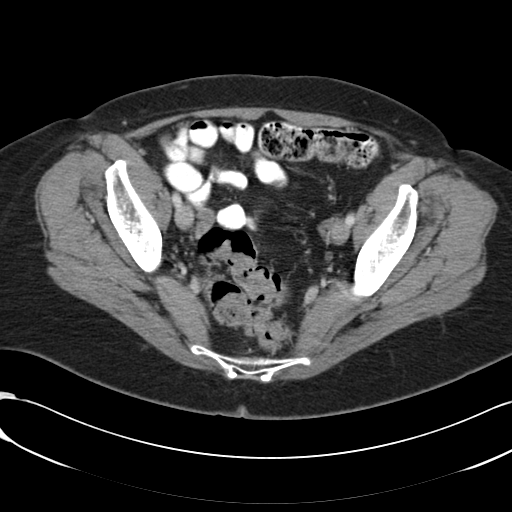
[im 31/87  soft-tissue]
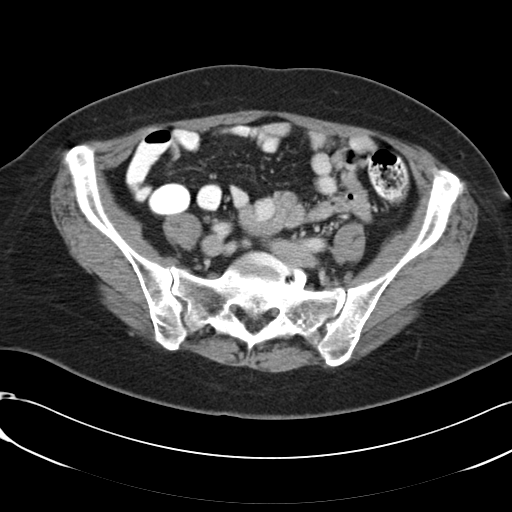
[im 35/87  soft-tissue]
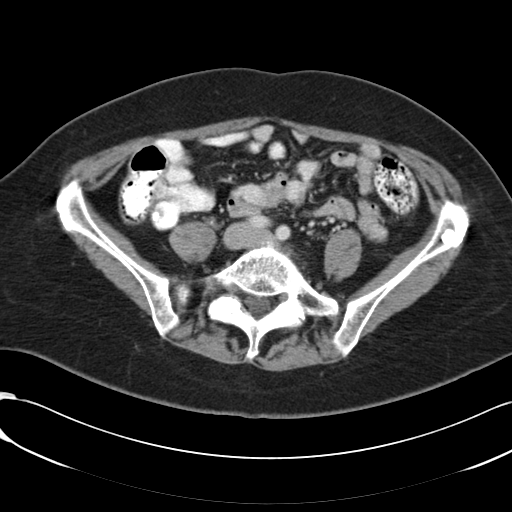
[im 39/87  soft-tissue]
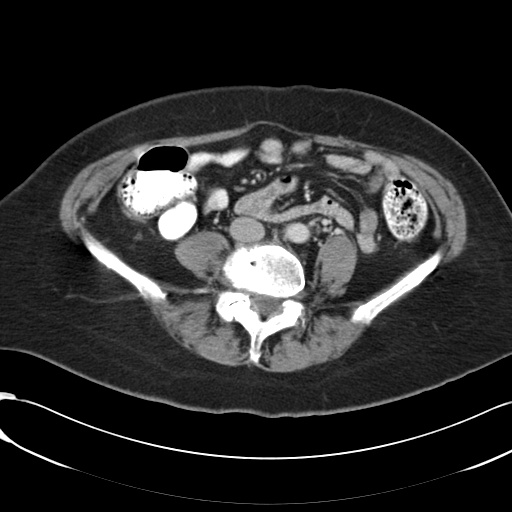
[im 48/87  soft-tissue]
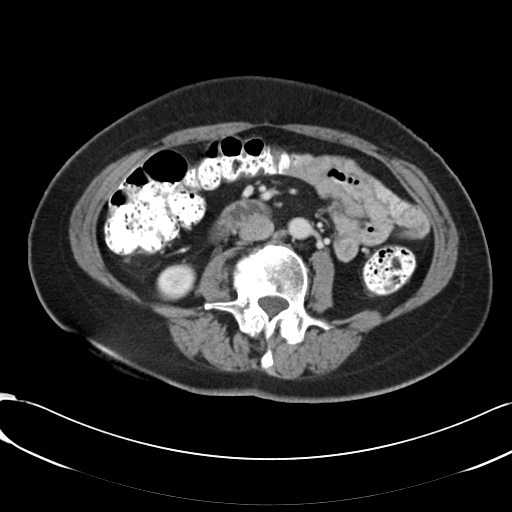
[im 52/87  soft-tissue]
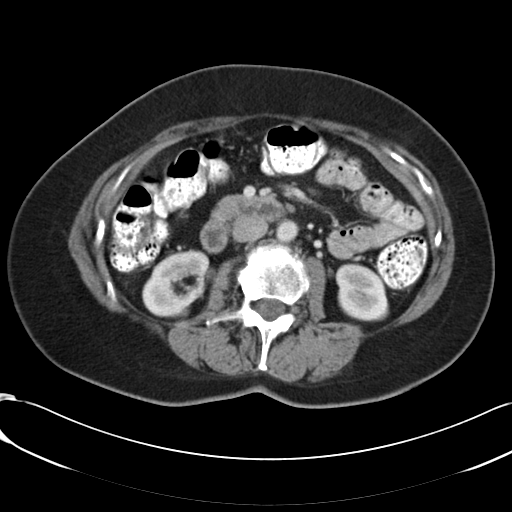
[im 52/87  bone]
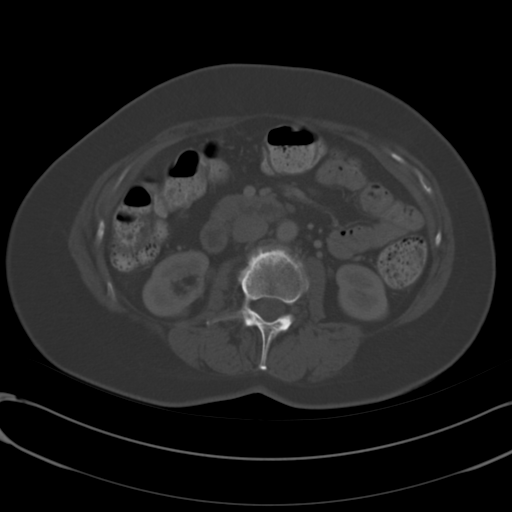
[im 56/87  soft-tissue]
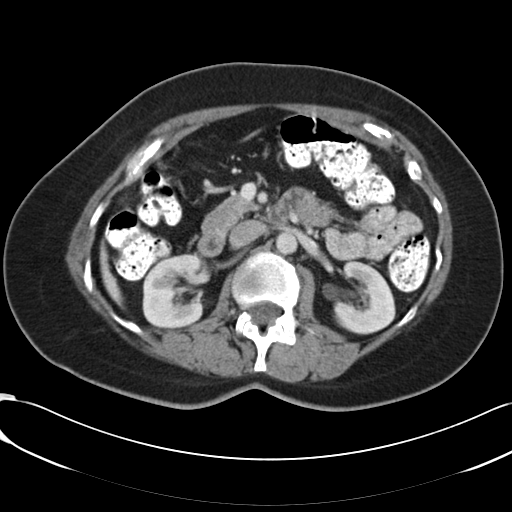
[im 65/87  soft-tissue]
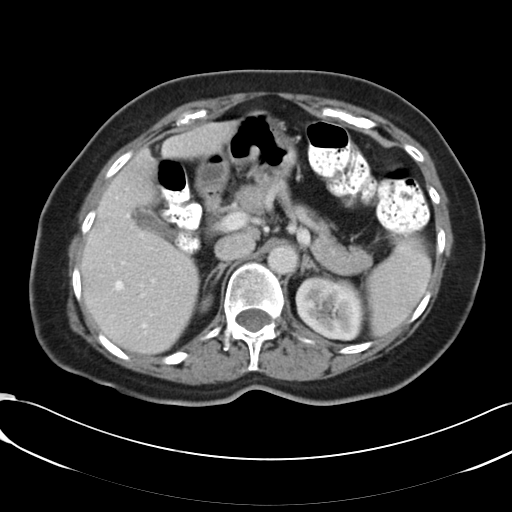
[im 69/87  soft-tissue]
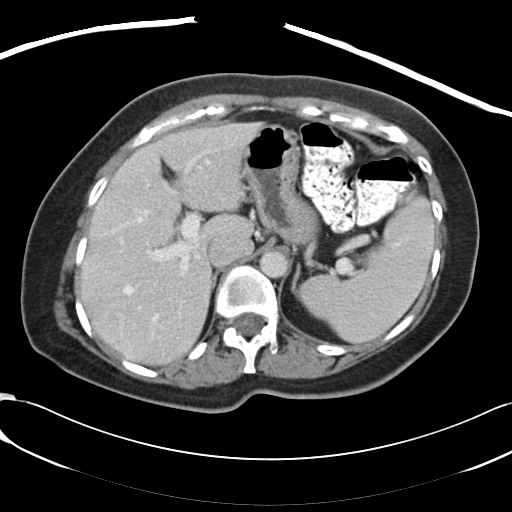
[im 74/87  soft-tissue]
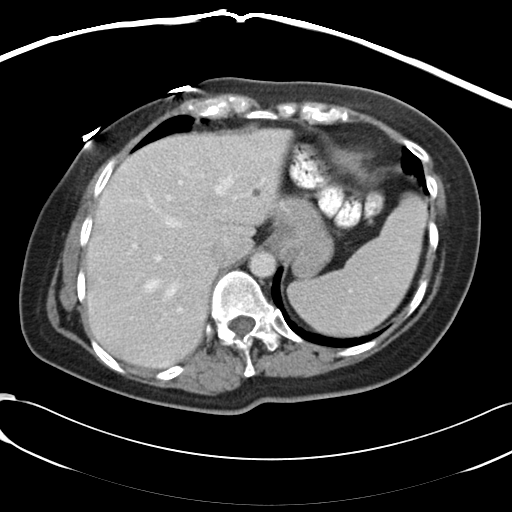
[im 82/87  soft-tissue]
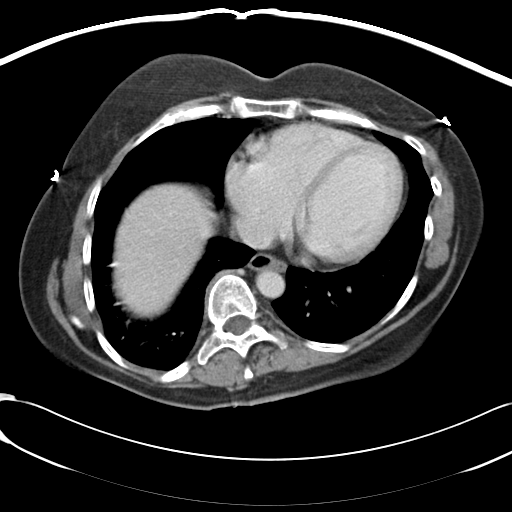

[Series 602: <mpr range> · coronal · 0.87mm/px · 3 of 101 slices shown]
[im 34/101  soft-tissue]
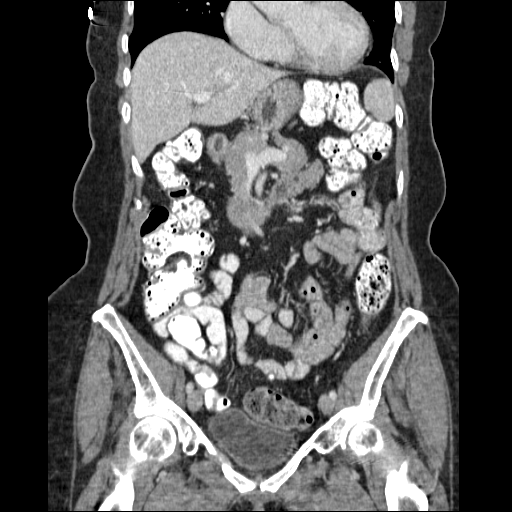
[im 45/101  soft-tissue]
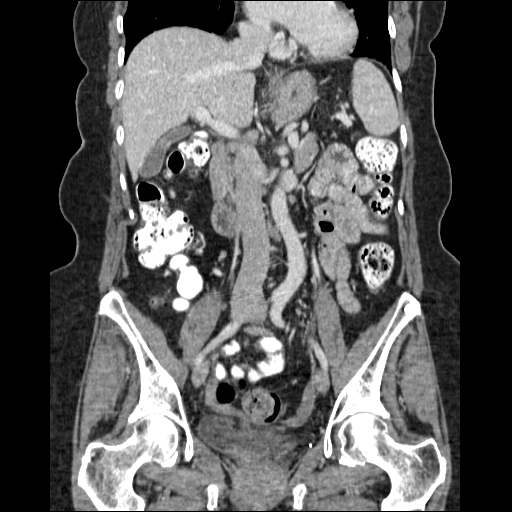
[im 56/101  soft-tissue]
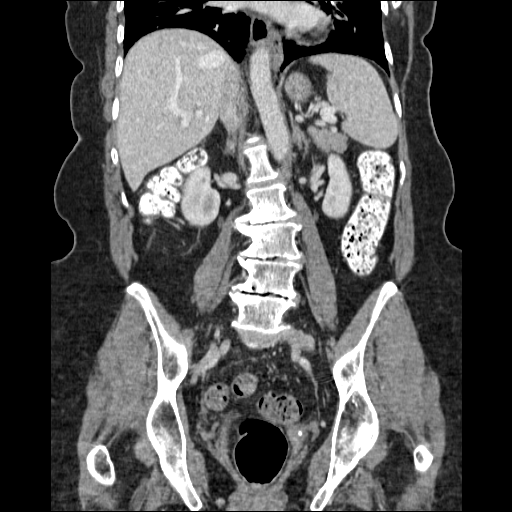

[17 of 46 positions shown; findings below may reference images not displayed]

FINDINGS: The lung bases are clear.  There is no pleural effusion.
There are stable low density hepatic lesions on images 11 and 14
consistent with cysts.  There are no suspicious liver lesions.  The
spleen, gallbladder, pancreas, adrenal glands and kidneys appear
unremarkable.

There is a convex left lumbar scoliosis with associated
spondylosis.  No acute osseous findings are identified.  The
vascular structures appear normal for age.  There are no enlarged
abdominal pelvic lymph nodes.

The bowel gas pattern appears normal.  There is a normal-appearing
retrocecal appendix.  There is no evidence of abdominal wall
hernia.  The ovaries appear stable status post hysterectomy.  There
are stable pelvic phleboliths.  No suprapubic abnormalities are
seen.
IMPRESSION: Stable examination.  No acute findings or explanation for abdominal
pain.

## 2011-10-22 ENCOUNTER — Encounter (HOSPITAL_BASED_OUTPATIENT_CLINIC_OR_DEPARTMENT_OTHER): Payer: Self-pay | Admitting: *Deleted

## 2011-10-22 NOTE — Progress Notes (Signed)
NPO AFTER MN. ARRIVES AT 0900. NEEDS HG AND EKG.  WILL TAKES NEXIUM AM OF SURG. W/ SIP OF WATER.  CALLING  ALEX PERKINS PA , ? ORDERS FOR CBC, BMET, AND CXR.

## 2011-10-22 NOTE — H&P (Signed)
CC- [name] [name] is a 61 y.o. female who presents with left knee pain.  HPI- . Knee Pain: Patient presents with knee pain involving the  left knee. Onset of the symptoms was several months ago. Inciting event: none known. Current symptoms include giving out, pain located medially and stiffness. Pain is aggravated by going up and down stairs, kneeling and squatting.  Patient has had no prior knee problems. Evaluation to date: plain films: normal. Treatment to date: avoidance of offending activity and OTC analgesics which are ineffective.  Past Medical History  Diagnosis Date  . Melanoma of skin   . Basal cell cancer 5/08  . Hyperlipidemia DIET CONTROL  . GERD (gastroesophageal reflux disease)     HH  . HSV-2 infection   . Eczema   . Osteopenia   . Chest pain 9/11    [facility]) and palpitations - ruled out for MI with nl. Echo  . History of abdominal pain  2011--- DUE TO IMPACTION PER XRAY  . Chronic fatigue PT DENIES  . Heart palpitations OCCASIONAL--  TAKES ATENOLOL PRN  . Normal nuclear stress test 07-09-10  . Arthritis     NECK  . Acute meniscal tear of knee LEFT  . Complication of anesthesia HARD TO WAKE  . Hiatal hernia     Past Surgical History  Procedure Date  . Mri neck 3/00    C4-C5 herniation small stenosis mild C4-5, C5-6  . Melanoma excision     TRUNK  . Carotid US 9/05    Mild, recheck 1 year  . Carotid US 10/06    Normal  . Esophagogastroduodenoscopy 9/07    Normal  . Ct abdomen and pelvis 9/07    (No contrast) Negative, no stones  . Abnormal mole removed from leg - ? basal cell   . Vaginal hysterectomy 2001  . Hysteroscopy with resectoscope 2000    POLPECTOMY'S  . Tubal ligation 23 YRS AGO  . Tonsillectomy   . Transthoracic echocardiogram 06-29-2010    NORMAL LVSF, EF 55-60%,  MILD MR    Prior to Admission medications   Medication Sig Start Date End Date Taking? Authorizing Provider  5-Hydroxytryptophan (5-HTP) 100 MG CAPS Take 1 capsule by mouth  daily.    Yes Historical Provider, MD  acyclovir (ZOVIRAX) 200 MG capsule Take 200 mg by mouth as needed. ? Dosage.  Takes it twice weekly as needed. 09/20/11  Yes Roxy Manns, MD  acyclovir cream (ZOVIRAX) 5 % Apply 1 application topically as needed. 09/20/11 09/19/12 Yes Roxy Manns, MD  aspirin 81 MG tablet Take 81 mg by mouth daily.    Yes Historical Provider, MD  atenolol (TENORMIN) 25 MG tablet Take 12.5 mg by mouth at bedtime as needed. TAKES WHEN HAS PALPITATIONS.  Take 1/2 tablet by mouth every day.   Yes Historical Provider, MD  calcium carbonate (OS-CAL) 600 MG TABS Take 600 mg by mouth 2 (two) times daily with a meal.    Yes Historical Provider, MD  Cholecalciferol (VITAMIN D) 1000 UNITS capsule Take 1,000 Units by mouth daily.    Yes Historical Provider, MD  Cranberry 1000 MG CAPS Take 1 capsule by mouth daily. ? Strength.  Take one by mouth daily.   Yes Historical Provider, MD  diphenhydrAMINE (SOMINEX) 25 MG tablet Take 25 mg by mouth at bedtime as needed.   Yes Historical Provider, MD  esomeprazole (NEXIUM) 40 MG capsule Take 40 mg by mouth every morning. 09/20/11 09/19/12 Yes Roxy Manns, MD  hydrocortisone (  ANUSOL-HC) 2.5 % rectal cream Place 1 application rectally as needed. Apply to affected area once daily as needed for hemorrhoids.   Yes Historical Provider, MD  Hydrocortisone Butyr Lipo Base (LOCOID LIPOCREAM) 0.1 % CREA Apply to affected areas once daily as needed.    Yes Historical Provider, MD  magnesium gluconate (MAGONATE) 500 MG tablet Take 500 mg by mouth daily.   Yes Historical Provider, MD  Multiple Vitamin (MULTIVITAMIN) tablet Take 1 tablet by mouth daily.    Yes Historical Provider, MD   KNEE EXAM antalgic gait, soft tissue tenderness over medial joint line, reduced range of motion, negative pivot-shift, collateral ligaments intact, normal ipsilateral hip exam  Physical Examination: General appearance - alert, well appearing, and in no distress Mental status -  alert, oriented to person, place, and time Neck - supple, no significant adenopathy Lymphatics - no palpable lymphadenopathy, no hepatosplenomegaly Chest - clear to auscultation, no wheezes, rales or rhonchi, symmetric air entry Heart - normal rate, regular rhythm, normal S1, S2, no murmurs, rubs, clicks or gallops Neurological - alert, oriented, normal speech, no focal findings or movement disorder noted   Asessment/Plan--- Left knee medial meniscal tear- - Plan left knee arthroscopy with meniscal debridement. Procedure risks and potential comps discussed with patient who elects to proceed. Goals are decreased pain and increased function with a high likelihood of achieving both

## 2011-10-23 ENCOUNTER — Other Ambulatory Visit: Payer: Self-pay

## 2011-10-23 ENCOUNTER — Encounter (HOSPITAL_BASED_OUTPATIENT_CLINIC_OR_DEPARTMENT_OTHER): Payer: Self-pay | Admitting: Anesthesiology

## 2011-10-23 ENCOUNTER — Encounter (HOSPITAL_BASED_OUTPATIENT_CLINIC_OR_DEPARTMENT_OTHER): Payer: Self-pay | Admitting: *Deleted

## 2011-10-23 ENCOUNTER — Encounter (HOSPITAL_BASED_OUTPATIENT_CLINIC_OR_DEPARTMENT_OTHER)
Admission: RE | Disposition: A | Discharge status: Home / Self Care | Payer: Self-pay | Source: Ambulatory Visit | Attending: Orthopedic Surgery

## 2011-10-23 ENCOUNTER — Ambulatory Visit (HOSPITAL_BASED_OUTPATIENT_CLINIC_OR_DEPARTMENT_OTHER): Payer: BC Managed Care – PPO | Admitting: Anesthesiology

## 2011-10-23 ENCOUNTER — Ambulatory Visit (HOSPITAL_BASED_OUTPATIENT_CLINIC_OR_DEPARTMENT_OTHER)
Admission: RE | Admit: 2011-10-23 | Discharge: 2011-10-23 | Disposition: A | Discharge status: Home / Self Care | Payer: BC Managed Care – PPO | Source: Ambulatory Visit | Attending: Orthopedic Surgery | Admitting: Orthopedic Surgery

## 2011-10-23 DIAGNOSIS — Z85820 Personal history of malignant melanoma of skin: Secondary | ICD-10-CM | POA: Insufficient documentation

## 2011-10-23 DIAGNOSIS — M23329 Other meniscus derangements, posterior horn of medial meniscus, unspecified knee: Secondary | ICD-10-CM | POA: Insufficient documentation

## 2011-10-23 DIAGNOSIS — M949 Disorder of cartilage, unspecified: Secondary | ICD-10-CM | POA: Insufficient documentation

## 2011-10-23 DIAGNOSIS — K219 Gastro-esophageal reflux disease without esophagitis: Secondary | ICD-10-CM | POA: Insufficient documentation

## 2011-10-23 DIAGNOSIS — M2390 Unspecified internal derangement of unspecified knee: Secondary | ICD-10-CM | POA: Insufficient documentation

## 2011-10-23 DIAGNOSIS — S83249A Other tear of medial meniscus, current injury, unspecified knee, initial encounter: Secondary | ICD-10-CM

## 2011-10-23 DIAGNOSIS — Z79899 Other long term (current) drug therapy: Secondary | ICD-10-CM | POA: Insufficient documentation

## 2011-10-23 DIAGNOSIS — E785 Hyperlipidemia, unspecified: Secondary | ICD-10-CM | POA: Insufficient documentation

## 2011-10-23 DIAGNOSIS — M899 Disorder of bone, unspecified: Secondary | ICD-10-CM | POA: Insufficient documentation

## 2011-10-23 HISTORY — DX: Reserved for inherently not codable concepts without codable children: IMO0001

## 2011-10-23 HISTORY — DX: Palpitations: R00.2

## 2011-10-23 HISTORY — DX: Adverse effect of unspecified anesthetic, initial encounter: T41.45XA

## 2011-10-23 HISTORY — DX: Other complications of anesthesia, initial encounter: T88.59XA

## 2011-10-23 HISTORY — DX: Unspecified tear of unspecified meniscus, current injury, unspecified knee, initial encounter: S83.209A

## 2011-10-23 HISTORY — DX: Diaphragmatic hernia without obstruction or gangrene: K44.9

## 2011-10-23 HISTORY — DX: Unspecified osteoarthritis, unspecified site: M19.90

## 2011-10-23 HISTORY — PX: KNEE ARTHROSCOPY: SHX127

## 2011-10-23 HISTORY — PX: CHONDROPLASTY: SHX5177

## 2011-10-23 SURGERY — ARTHROSCOPY, KNEE
Anesthesia: General | Site: Knee | Laterality: Left | Wound class: Clean

## 2011-10-23 MED ORDER — SODIUM CHLORIDE 0.9 % IV SOLN: INTRAVENOUS | Status: DC

## 2011-10-23 MED ORDER — BUPIVACAINE-EPINEPHRINE 0.25% -1:200000 IJ SOLN
INTRAMUSCULAR | Status: DC | PRN
  Administered 2011-10-23: 20 mL

## 2011-10-23 MED ORDER — CHLORHEXIDINE GLUCONATE 4 % EX LIQD
60.0000 mL | Freq: Once | CUTANEOUS | Status: DC
Start: 2011-10-23 — End: 2011-10-23

## 2011-10-23 MED ORDER — ACETAMINOPHEN 10 MG/ML IV SOLN: 1000.0000 mg | Freq: Once | INTRAVENOUS | Status: DC

## 2011-10-23 MED ORDER — CEFAZOLIN SODIUM 1-5 GM-% IV SOLN
1.0000 g | Freq: Once | INTRAVENOUS | Status: AC
  Administered 2011-10-23: 1 g via INTRAVENOUS

## 2011-10-23 MED ORDER — DEXAMETHASONE SODIUM PHOSPHATE 4 MG/ML IJ SOLN
INTRAMUSCULAR | Status: DC | PRN
  Administered 2011-10-23: 10 mg via INTRAVENOUS

## 2011-10-23 MED ORDER — LIDOCAINE HCL (CARDIAC) 20 MG/ML IV SOLN
INTRAVENOUS | Status: DC | PRN
  Administered 2011-10-23: 80 mg via INTRAVENOUS

## 2011-10-23 MED ORDER — FENTANYL CITRATE 0.05 MG/ML IJ SOLN
25.0000 ug | INTRAMUSCULAR | Status: DC | PRN
  Administered 2011-10-23 (×2): 50 ug via INTRAVENOUS

## 2011-10-23 MED ORDER — SODIUM CHLORIDE 0.9 % IR SOLN
Status: DC | PRN
  Administered 2011-10-23: 9000 mL

## 2011-10-23 MED ORDER — FENTANYL CITRATE 0.05 MG/ML IJ SOLN
INTRAMUSCULAR | Status: DC | PRN
  Administered 2011-10-23 (×2): 25 ug via INTRAVENOUS
  Administered 2011-10-23: 50 ug via INTRAVENOUS
  Administered 2011-10-23 (×2): 25 ug via INTRAVENOUS

## 2011-10-23 MED ORDER — ONDANSETRON HCL 4 MG/2ML IJ SOLN
INTRAMUSCULAR | Status: DC | PRN
  Administered 2011-10-23: 4 mg via INTRAVENOUS

## 2011-10-23 MED ORDER — PROMETHAZINE HCL 25 MG/ML IJ SOLN: 6.2500 mg | INTRAMUSCULAR | Status: DC | PRN

## 2011-10-23 MED ORDER — OXYCODONE-ACETAMINOPHEN 5-325 MG PO TABS: 1.0000 | ORAL_TABLET | ORAL | Status: AC | PRN

## 2011-10-23 MED ORDER — PROPOFOL 10 MG/ML IV EMUL
INTRAVENOUS | Status: DC | PRN
  Administered 2011-10-23: 200 mg via INTRAVENOUS

## 2011-10-23 MED ORDER — LACTATED RINGERS IV SOLN
INTRAVENOUS | Status: DC
  Administered 2011-10-23: 100 mL/h via INTRAVENOUS
  Administered 2011-10-23 (×2): via INTRAVENOUS

## 2011-10-23 MED ORDER — METHOCARBAMOL 500 MG PO TABS: 500.0000 mg | ORAL_TABLET | Freq: Four times a day (QID) | ORAL | Status: AC

## 2011-10-23 MED ORDER — OXYCODONE-ACETAMINOPHEN 5-325 MG PO TABS
1.0000 | ORAL_TABLET | ORAL | Status: AC | PRN
  Administered 2011-10-23: 1 via ORAL

## 2011-10-23 SURGICAL SUPPLY — 35 items
BANDAGE ELASTIC 6 VELCRO ST LF (GAUZE/BANDAGES/DRESSINGS) ×2 IMPLANT
BLADE 4.2CUDA (BLADE) ×2 IMPLANT
BLADE CUDA SHAVER 3.5 (BLADE) IMPLANT
BLADE CUTTER GATOR 3.5 (BLADE) IMPLANT
CANISTER SUCT LVC 12 LTR MEDI- (MISCELLANEOUS) ×2 IMPLANT
CANISTER SUCTION 2500CC (MISCELLANEOUS) IMPLANT
CLOTH BEACON ORANGE TIMEOUT ST (SAFETY) ×2 IMPLANT
DRAPE ARTHROSCOPY W/POUCH 114 (DRAPES) ×2 IMPLANT
DRSG EMULSION OIL 3X3 NADH (GAUZE/BANDAGES/DRESSINGS) ×2 IMPLANT
DURAPREP 26ML APPLICATOR (WOUND CARE) ×2 IMPLANT
ELECT MENISCUS 165MM 90D (ELECTRODE) IMPLANT
ELECT REM PT RETURN 9FT ADLT (ELECTROSURGICAL)
ELECTRODE REM PT RTRN 9FT ADLT (ELECTROSURGICAL) IMPLANT
GLOVE BIO SURGEON STRL SZ 6.5 (GLOVE) ×2 IMPLANT
GLOVE BIO SURGEON STRL SZ8 (GLOVE) ×2 IMPLANT
GLOVE ECLIPSE 6.0 STRL STRAW (GLOVE) ×2 IMPLANT
GLOVE INDICATOR 8.0 STRL GRN (GLOVE) ×2 IMPLANT
GOWN PREVENTION PLUS LG XLONG (DISPOSABLE) ×4 IMPLANT
IV NS IRRIG 3000ML ARTHROMATIC (IV SOLUTION) ×6 IMPLANT
KNEE WRAP E Z 3 GEL PACK (MISCELLANEOUS) ×2 IMPLANT
PACK ARTHROSCOPY DSU (CUSTOM PROCEDURE TRAY) ×2 IMPLANT
PACK BASIN DAY SURGERY FS (CUSTOM PROCEDURE TRAY) ×2 IMPLANT
PAD ABD 7.5X8 STRL (GAUZE/BANDAGES/DRESSINGS) ×2 IMPLANT
PADDING CAST ABS 4INX4YD NS (CAST SUPPLIES) ×1
PADDING CAST ABS COTTON 4X4 ST (CAST SUPPLIES) ×1 IMPLANT
PADDING CAST COTTON 6X4 STRL (CAST SUPPLIES) ×2 IMPLANT
PENCIL BUTTON HOLSTER BLD 10FT (ELECTRODE) IMPLANT
SET ARTHROSCOPY TUBING (MISCELLANEOUS) ×1
SET ARTHROSCOPY TUBING LN (MISCELLANEOUS) ×1 IMPLANT
SPONGE GAUZE 4X4 12PLY (GAUZE/BANDAGES/DRESSINGS) ×2 IMPLANT
SUT ETHILON 4 0 PS 2 18 (SUTURE) ×2 IMPLANT
TOWEL OR 17X24 6PK STRL BLUE (TOWEL DISPOSABLE) ×2 IMPLANT
WAND 30 DEG SABER W/CORD (SURGICAL WAND) IMPLANT
WAND 90 DEG TURBOVAC W/CORD (SURGICAL WAND) ×2 IMPLANT
WATER STERILE IRR 500ML POUR (IV SOLUTION) ×2 IMPLANT

## 2011-10-23 NOTE — Op Note (Signed)
Preoperative diagnosis-  Left knee medial meniscal tear  Postoperative diagnosis Left- knee medial meniscal tear   Plus Left medial femoral chondral defect  Procedure- Left knee arthroscopy with medial  Meniscal debridement and chondroplasty  Surgeon- Gus Rankin. Ersie Savino, MD  Anesthesia-General  EBL-  minimal Complications- None  Condition- PACU - hemodynamically stable.  Brief clinical note- -[name] [name] is a 61 y.o.  female with a several month history of left knee pain and mechanical symptoms. Exam and history suggested medial meniscal tear confirmed by MRI. The patient presents now for arthroscopy and debridement  Procedure in detail -       After successful administration of General anesthetic, a tourmiquet is placed high on the Left  thigh and the Left lower extremity is prepped and draped in the usual sterile fashion. Time out is performed by the surgical team. Standard superomedial and inferolateral portal sites are marked and incisions made with an 11 blade. The inflow cannula is passed through the superomedial portal and camera through the inferolateral portal and inflow is initiated. Arthroscopic visualization proceeds.      The undersurface of the patella and trochlea are visualized and there is grade II patellar chondromalacia without any unstable defects. The medial and lateral gutters are visualized and there are  no loose bodies. Flexion and valgus force is applied to the knee and the medial compartment is entered. A spinal needle is passed into the joint through the site marked for the inferomedial portal. A small incision is made and the dilator passed into the joint. The findings for the medial compartment are significant degenerative tear of the body and posterior horn of the medial meniscus with chondral defect medial femoral condyle approximately 2 x 2 cm . The tear is debrided to a stable base with baskets and a shaver and sealed off with the Arthrocare. The shaver is  used to debride the unstable cartilage to a stable cartilaginous base with stable edges. It is probed and found to be stable.    The intercondylar notch is visualized and the ACL appears normal. The lateral compartment is entered and the findings are normal.     The joint is again inspected and there are no other tears, defects or loose bodies identified. The arthroscopic equipment is then removed from the inferior portals which are closed with interrupted 4-0 nylon. 20 ml of .25% Marcaine with epinephrine are injected through the inflow cannula and the cannula is then removed and the portal closed with nylon. The incisions are cleaned and dried and a bulky sterile dressing is applied. The patient is then awakened and transported to recovery in stable condition.   10/23/2011, 11:17 AM

## 2011-10-23 NOTE — Interval H&P Note (Signed)
History and Physical Interval Note:  10/23/2011 10:14 AM  [name] [name]  has presented today for surgery, with the diagnosis of MEDIAlL MENISCAL TEAR LEFT KNEE  The various methods of treatment have been discussed with the patient and family. After consideration of risks, benefits and other options for treatment, the patient has consented to  Procedure(s): ARTHROSCOPY KNEE as a surgical intervention .  The patients' history has been reviewed, patient examined, no change in status, stable for surgery.  I have reviewed the patients' chart and labs.  Questions were answered to the patient's satisfaction.     Loanne Drilling

## 2011-10-23 NOTE — Anesthesia Preprocedure Evaluation (Addendum)
Anesthesia Evaluation  Patient identified by MRN, date of birth, ID band Patient awake    Reviewed: Allergy & Precautions, H&P , NPO status , Patient's Chart, lab work & pertinent test results, reviewed documented beta blocker date and time   Airway Mallampati: II TM Distance: >3 FB     Dental  (+) Partial Lower   Pulmonary neg pulmonary ROS,  clear to auscultation        Cardiovascular neg cardio ROS Regular Normal Denies cardiac symptoms   Neuro/Psych Negative Neurological ROS  Negative Psych ROS   GI/Hepatic Neg liver ROS, GERD-  ,  Endo/Other  Negative Endocrine ROS  Renal/GU negative Renal ROS  Genitourinary negative   Musculoskeletal negative musculoskeletal ROS (+)   Abdominal   Peds negative pediatric ROS (+)  Hematology negative hematology ROS (+)   Anesthesia Other Findings   Reproductive/Obstetrics negative OB ROS                           Anesthesia Physical Anesthesia Plan  ASA: II  Anesthesia Plan: General   Post-op Pain Management:    Induction: Intravenous  Airway Management Planned: LMA  Additional Equipment:   Intra-op Plan:   Post-operative Plan: Extubation in OR  Informed Consent: I have reviewed the patients History and Physical, chart, labs and discussed the procedure including the risks, benefits and alternatives for the proposed anesthesia with the patient or authorized representative who has indicated his/her understanding and acceptance.     Plan Discussed with: CRNA and Surgeon  Anesthesia Plan Comments:         Anesthesia Quick Evaluation

## 2011-10-23 NOTE — Transfer of Care (Signed)
Immediate Anesthesia Transfer of Care Note  Patient: [name] [name]  Procedure(s) Performed:  ARTHROSCOPY KNEE - LEFT KNEE SCOPE WITH DEBRIDEMENT ; CHONDROPLASTY - Left medial   Patient Location: Patient transported to PACU with oxygen via face mask at 6 Liters / Min  Anesthesia Type: General  Level of Consciousness: awake and alert   Airway & Oxygen Therapy: Patient Spontanous Breathing and Patient connected to face mask oxygen Post-op Assessment: Report given to PACU RN and Post -op Vital signs reviewed and stable  Post vital signs: Reviewed and stable  Complications: No apparent anesthesia complications  Filed Vitals:   10/23/11 0926  BP: 98/67  Pulse: 58  Temp: 36.2 C  Resp: 18

## 2011-10-23 NOTE — Anesthesia Postprocedure Evaluation (Signed)
  Anesthesia Post-op Note  Patient: [name] [name]  Procedure(s) Performed:  ARTHROSCOPY KNEE - LEFT KNEE SCOPE WITH DEBRIDEMENT ; CHONDROPLASTY - Left medial   Patient Location: PACU  Anesthesia Type: General  Level of Consciousness: oriented and sedated  Airway and Oxygen Therapy: Patient Spontanous Breathing  Post-op Pain: mild  Post-op Assessment: Post-op Vital signs reviewed, Patient's Cardiovascular Status Stable, Respiratory Function Stable and Patent Airway  Post-op Vital Signs: stable  Complications: No apparent anesthesia complications 

## 2011-10-23 NOTE — Anesthesia Procedure Notes (Signed)
Procedure Name: LMA Insertion Date/Time: 10/23/2011 10:44 AM Performed by: Lorrin Jackson Pre-anesthesia Checklist: Patient identified, Emergency Drugs available, Suction available and Patient being monitored Patient Re-evaluated:Patient Re-evaluated prior to inductionOxygen Delivery Method: Circle System Utilized Preoxygenation: Pre-oxygenation with 100% oxygen Intubation Type: IV induction Ventilation: Mask ventilation without difficulty LMA: LMA inserted LMA Size: 4.0 Number of attempts: 1 Airway Equipment and Method: bite block Placement Confirmation: positive ETCO2 Tube secured with: Tape Dental Injury: Teeth and Oropharynx as per pre-operative assessment  Comments: Inserted By Dr. Shireen Quan

## 2011-10-24 ENCOUNTER — Ambulatory Visit (HOSPITAL_COMMUNITY)
Admission: RE | Admit: 2011-10-24 | Discharge: 2011-10-24 | Disposition: A | Discharge status: Home / Self Care | Payer: BC Managed Care – PPO | Source: Ambulatory Visit | Attending: Orthopedic Surgery | Admitting: Orthopedic Surgery

## 2011-10-24 ENCOUNTER — Encounter (HOSPITAL_BASED_OUTPATIENT_CLINIC_OR_DEPARTMENT_OTHER): Payer: Self-pay | Admitting: Orthopedic Surgery

## 2011-10-24 DIAGNOSIS — M949 Disorder of cartilage, unspecified: Secondary | ICD-10-CM

## 2011-10-24 DIAGNOSIS — R609 Edema, unspecified: Secondary | ICD-10-CM

## 2011-10-24 DIAGNOSIS — M79609 Pain in unspecified limb: Secondary | ICD-10-CM

## 2011-10-24 DIAGNOSIS — M899 Disorder of bone, unspecified: Secondary | ICD-10-CM

## 2011-10-24 DIAGNOSIS — R52 Pain, unspecified: Secondary | ICD-10-CM

## 2011-10-24 NOTE — Progress Notes (Signed)
*  PRELIMINARY RESULTS*  Left lower extremity venous duplex  has been performed.  Left:  No evidence of DVT, superficial thrombosis. Baker's cyst noted on left with mixed echoes.                  Vanna Scotland 10/24/2011, 6:14 PM

## 2011-10-24 NOTE — Anesthesia Postprocedure Evaluation (Signed)
  Anesthesia Post-op Note  Patient: [name] [name]  Procedure(s) Performed:  ARTHROSCOPY KNEE - LEFT KNEE SCOPE WITH DEBRIDEMENT ; CHONDROPLASTY - Left medial   Patient Location: PACU  Anesthesia Type: General  Level of Consciousness: oriented and sedated  Airway and Oxygen Therapy: Patient Spontanous Breathing  Post-op Pain: mild  Post-op Assessment: Post-op Vital signs reviewed, Patient's Cardiovascular Status Stable, Respiratory Function Stable and Patent Airway  Post-op Vital Signs: stable  Complications: No apparent anesthesia complications

## 2011-11-11 ENCOUNTER — Other Ambulatory Visit: Payer: Self-pay | Admitting: Gynecology

## 2011-11-11 DIAGNOSIS — Z1231 Encounter for screening mammogram for malignant neoplasm of breast: Secondary | ICD-10-CM

## 2011-11-25 ENCOUNTER — Other Ambulatory Visit: Payer: Self-pay | Admitting: Dermatology

## 2011-12-04 ENCOUNTER — Ambulatory Visit: Payer: BC Managed Care – PPO

## 2011-12-11 ENCOUNTER — Ambulatory Visit
Admission: RE | Admit: 2011-12-11 | Discharge: 2011-12-11 | Disposition: A | Discharge status: Home / Self Care | Payer: BC Managed Care – PPO | Source: Ambulatory Visit | Attending: Gynecology | Admitting: Gynecology

## 2011-12-11 DIAGNOSIS — Z1231 Encounter for screening mammogram for malignant neoplasm of breast: Secondary | ICD-10-CM

## 2012-01-16 ENCOUNTER — Ambulatory Visit (INDEPENDENT_AMBULATORY_CARE_PROVIDER_SITE_OTHER): Payer: BC Managed Care – PPO | Admitting: Family Medicine

## 2012-01-16 ENCOUNTER — Encounter: Payer: Self-pay | Admitting: Family Medicine

## 2012-01-16 ENCOUNTER — Other Ambulatory Visit: Payer: Self-pay | Admitting: Family Medicine

## 2012-01-16 VITALS — BP 112/82 | HR 68 | Temp 97.7°F | Wt 173.0 lb

## 2012-01-16 DIAGNOSIS — R300 Dysuria: Secondary | ICD-10-CM

## 2012-01-16 LAB — POCT URINALYSIS DIPSTICK
Ketones, UA: NEGATIVE
Leukocytes, UA: NEGATIVE
Protein, UA: NEGATIVE

## 2012-01-16 MED ORDER — CIPROFLOXACIN HCL 500 MG PO TABS: 500.0000 mg | ORAL_TABLET | Freq: Two times a day (BID) | ORAL | Status: AC

## 2012-01-16 NOTE — Progress Notes (Signed)
SUBJECTIVE: [name] [name] is a 61 y.o. female who complains of urinary frequency, urgency and dysuria x 2 days, without flank pain, fever, chills, or abnormal vaginal discharge or bleeding.   Patient Active Problem List  Diagnoses  . GENITAL HERPES- BREAKOUT BUTTOCK  . UNSPECIFIED VITAMIN D DEFICIENCY  . HYPERCHOLESTEROLEMIA, PURE  . EXTERNAL HEMORRHOIDS WITHOUT MENTION COMP  . OTHER CHRONIC SINUSITIS  . ALLERGIC RHINITIS  . GERD  . HIATAL HERNIA  . OSTEOPENIA  . FATIGUE, CHRONIC  . ABNORMAL EKG  . STRESS REACTION, ACUTE, WITH EMOTIONAL DISTURBANCE  . THROAT PAIN, CHRONIC  . DERMATITIS, ATOPIC  . URTICARIA DUE TO COLD OR HEAT  . Routine general medical examination at a health care facility   Past Medical History  Diagnosis Date  . Melanoma of skin   . Basal cell cancer 5/08  . Hyperlipidemia DIET CONTROL  . GERD (gastroesophageal reflux disease)     HH  . HSV-2 infection   . Eczema   . Osteopenia   . Chest pain 9/11    [facility]) and palpitations - ruled out for MI with nl. Echo  . History of abdominal pain  2011--- DUE TO IMPACTION PER XRAY  . Chronic fatigue PT DENIES  . Heart palpitations OCCASIONAL--  TAKES ATENOLOL PRN  . Normal nuclear stress test 07-09-10  . Arthritis     NECK  . Acute meniscal tear of knee LEFT  . Complication of anesthesia HARD TO WAKE  . Hiatal hernia    Past Surgical History  Procedure Date  . Mri neck 3/00    C4-C5 herniation small stenosis mild C4-5, C5-6  . Melanoma excision     TRUNK  . Carotid US 9/05    Mild, recheck 1 year  . Carotid US 10/06    Normal  . Esophagogastroduodenoscopy 9/07    Normal  . Ct abdomen and pelvis 9/07    (No contrast) Negative, no stones  . Abnormal mole removed from leg - ? basal cell   . Vaginal hysterectomy 2001  . Hysteroscopy with resectoscope 2000    POLPECTOMY'S  . Tubal ligation 23 YRS AGO  . Tonsillectomy   . Transthoracic echocardiogram 06-29-2010    NORMAL LVSF, EF 55-60%,  MILD MR  .  Knee arthroscopy 10/23/2011    Procedure: ARTHROSCOPY KNEE;  Surgeon: Loanne Drilling, MD;  Location: [facility];  Service: Orthopedics;  Laterality: Left;  LEFT KNEE SCOPE WITH DEBRIDEMENT   . Chondroplasty 10/23/2011    Procedure: CHONDROPLASTY;  Surgeon: Loanne Drilling, MD;  Location: [facility];  Service: Orthopedics;  Laterality: Left;  Left medial    History  Substance Use Topics  . Smoking status: Former Smoker    Types: Cigarettes    Quit date: 10/07/1980  . Smokeless tobacco: Never Used  . Alcohol Use: No   Family History  Problem Relation Age of Onset  . Hypertension Mother   . Cancer Mother     uterine  . Obesity Mother   . Thrombocytopenia Mother   . Alcohol abuse Father   . Heart disease Father     CAD in 80's  . Heart disease Paternal Aunt     CAD  . Heart disease Paternal Uncle     CAD  . Colon cancer Neg Hx    Allergies  Allergen Reactions  . Amoxicillin Rash  . Codeine Rash  . Clarithromycin     REACTION: reaction not known   Current Outpatient Prescriptions  on File Prior to Visit  Medication Sig Dispense Refill  . 5-Hydroxytryptophan (5-HTP) 100 MG CAPS Take 1 capsule by mouth daily.       Marland Kitchen acyclovir (ZOVIRAX) 200 MG capsule Take 200 mg by mouth as needed. ? Dosage.  Takes it twice weekly as needed.      Marland Kitchen acyclovir cream (ZOVIRAX) 5 % Apply 1 application topically as needed.      Marland Kitchen aspirin 81 MG tablet Take 81 mg by mouth daily.       Marland Kitchen atenolol (TENORMIN) 25 MG tablet Take 12.5 mg by mouth at bedtime as needed. TAKES WHEN HAS PALPITATIONS.  Take 1/2 tablet by mouth every day.      . calcium carbonate (OS-CAL) 600 MG TABS Take 600 mg by mouth 2 (two) times daily with a meal.       . Cholecalciferol (VITAMIN D) 1000 UNITS capsule Take 1,000 Units by mouth daily.       . Cranberry 1000 MG CAPS Take 1 capsule by mouth daily. ? Strength.  Take one by mouth daily.      . diphenhydrAMINE (SOMINEX) 25 MG tablet Take 25 mg by  mouth at bedtime as needed.      Marland Kitchen esomeprazole (NEXIUM) 40 MG capsule Take 40 mg by mouth every morning.      . hydrocortisone (ANUSOL-HC) 2.5 % rectal cream Place 1 application rectally as needed. Apply to affected area once daily as needed for hemorrhoids.      . Hydrocortisone Butyr Lipo Base (LOCOID LIPOCREAM) 0.1 % CREA Apply to affected areas once daily as needed.       . magnesium gluconate (MAGONATE) 500 MG tablet Take 500 mg by mouth daily.      . Multiple Vitamin (MULTIVITAMIN) tablet Take 1 tablet by mouth daily.        The PMH, PSH, Social History, Family History, Medications, and allergies have been reviewed in [facility], and have been updated if relevant.  OBJECTIVE:  BP 112/82  Pulse 68  Temp(Src) 97.7 F (36.5 C) (Oral)  Wt 173 lb (78.472 kg)  Appears well, in no apparent distress.  Vital signs are normal. The abdomen is soft without tenderness, guarding, mass, rebound or organomegaly. No CVA tenderness or inguinal adenopathy noted. Urine dipstick shows positive for RBC's.     ASSESSMENT: UTI uncomplicated without evidence of pyelonephritis- cipro 500 mg twice daily x 3 days.  PLAN: Treatment per orders - also push fluids, may use Pyridium OTC prn. Call or return to clinic prn if these symptoms worsen or fail to improve as anticipated.

## 2012-01-18 LAB — URINE CULTURE
Colony Count: NO GROWTH
Organism ID, Bacteria: NO GROWTH

## 2012-03-18 ENCOUNTER — Other Ambulatory Visit: Payer: Self-pay | Admitting: Dermatology

## 2012-04-06 ENCOUNTER — Encounter: Payer: Self-pay | Admitting: *Deleted

## 2012-04-13 ENCOUNTER — Ambulatory Visit (INDEPENDENT_AMBULATORY_CARE_PROVIDER_SITE_OTHER): Payer: BC Managed Care – PPO | Admitting: Gynecology

## 2012-04-13 ENCOUNTER — Encounter: Payer: Self-pay | Admitting: Gynecology

## 2012-04-13 VITALS — BP 106/66 | Ht 66.0 in | Wt 173.0 lb

## 2012-04-13 DIAGNOSIS — R319 Hematuria, unspecified: Secondary | ICD-10-CM

## 2012-04-13 DIAGNOSIS — N390 Urinary tract infection, site not specified: Secondary | ICD-10-CM

## 2012-04-13 DIAGNOSIS — Z01419 Encounter for gynecological examination (general) (routine) without abnormal findings: Secondary | ICD-10-CM

## 2012-04-13 DIAGNOSIS — R300 Dysuria: Secondary | ICD-10-CM

## 2012-04-13 LAB — URINALYSIS W MICROSCOPIC + REFLEX CULTURE
Bilirubin Urine: NEGATIVE
Ketones, ur: 15 mg/dL — AB
Protein, ur: NEGATIVE mg/dL
Urobilinogen, UA: 0.2 mg/dL (ref 0.0–1.0)

## 2012-04-13 MED ORDER — SULFAMETHOXAZOLE-TMP DS 800-160 MG PO TABS: 1.0000 | ORAL_TABLET | Freq: Two times a day (BID) | ORAL | Status: AC

## 2012-04-13 NOTE — Patient Instructions (Signed)
Take antibiotics as prescribed. Follow up if urinary symptoms persist or recur. Follow up in one year for annual gynecologic exam.

## 2012-04-13 NOTE — Progress Notes (Signed)
[name] [name] Jul 07, 1951 [id number]        62 y.o.  for annual exam.  Doing well.  Past medical history,surgical history, medications, allergies, family history and social history were all reviewed and documented in the EPIC chart. ROS:  Was performed and pertinent positives and negatives are included in the history.  Exam: Sherrilyn Rist assistant Filed Vitals:   04/13/12 1127  BP: 106/66   General appearance  Normal Skin grossly normal Head/Neck normal with no cervical or supraclavicular adenopathy thyroid normal Lungs  clear Cardiac RR, without RMG Abdominal  soft, nontender, without masses, organomegaly or hernia Breasts  examined lying and sitting without masses, retractions, discharge or axillary adenopathy. Pelvic  Ext/BUS/vagina  normal with mild atrophic changes   Adnexa  Without masses or tenderness    Anus and perineum  normal   Rectovaginal  normal sphincter tone without palpated masses or tenderness.    Assessment/Plan:  61 y.o. female for annual exam.    1. Postmenopausal status post TVH for history of endometrial hyperplasia with mother's history of early endometrial cancer. Pathology showed complex hyperplasia without atypia.  Doing well without significant menopausal symptoms. We'll continue to monitor. 2. Osteopenia. DEXA 08/2010 T score of -1.5 FRAX tenure probability of fracture major osteoporotic 15%/hip fracture 0.6%. We'll repeat DEXA next year. Increase calcium vitamin D reviewed. 3. Dysuria. Patient with several day history of frequency/dysuria. No fever chills nausea vomiting diarrhea constipation or other constitutional symptoms. Urinalysis consistent with UTI. Will treat with Septra DS 1 by mouth twice a day x7 days, follow up if symptoms persist or recur. 4. Pap smear. No history of abnormal Pap smears before with numerous normal records in her chart. No Pap smear done today. Last Pap smear 2012. Reviewed current screening guidelines. She is status post hysterectomy  in options to stop altogether versus less frequent screening reviewed. We'll readdress on an annual basis. 5. Mammography. Patient her mammogram 12/2011. We'll continue with annual mammography. SBE monthly reviewed. 6. Colonoscopy. Patient's colonoscopy 2006. She's going to check with Dr. Milinda Antis to see when she is due for a repeat colonoscopy. I gave her a set of home stool check and she knows to mail them back, call us to make sure we receive them and they are negative. 7. Health maintenance. No blood work was done today as it's all done through Dr. Lucretia Roers office. Assuming she continues well from a gynecologic standpoint she will see me in a year, sooner as needed.    Dara Lords MD, 11:51 AM 04/13/2012

## 2012-04-15 LAB — URINE CULTURE
Colony Count: NO GROWTH
Organism ID, Bacteria: NO GROWTH

## 2012-04-15 NOTE — Addendum Note (Signed)
Addended by: Venora Maples on: 04/15/2012 11:33 AM   Modules accepted: Orders

## 2012-04-17 ENCOUNTER — Encounter: Payer: Self-pay | Admitting: Gynecology

## 2012-04-22 ENCOUNTER — Other Ambulatory Visit: Payer: BC Managed Care – PPO

## 2012-04-22 DIAGNOSIS — R319 Hematuria, unspecified: Secondary | ICD-10-CM

## 2012-04-23 LAB — URINALYSIS W MICROSCOPIC + REFLEX CULTURE
Bacteria, UA: NONE SEEN
Nitrite: NEGATIVE
Protein, ur: NEGATIVE mg/dL
Urobilinogen, UA: 0.2 mg/dL (ref 0.0–1.0)

## 2012-04-24 LAB — URINE CULTURE
Colony Count: NO GROWTH
Organism ID, Bacteria: NO GROWTH

## 2012-04-27 ENCOUNTER — Encounter: Payer: Self-pay | Admitting: *Deleted

## 2012-04-27 NOTE — Progress Notes (Signed)
Patient ID: [name] [name], female   DOB: 1951-04-22, 61 y.o.   MRN: [id number] Left on pt voicamail negative growth on recent urine culture.

## 2012-05-16 ENCOUNTER — Other Ambulatory Visit: Payer: Self-pay | Admitting: Family Medicine

## 2012-05-18 ENCOUNTER — Other Ambulatory Visit: Payer: Self-pay

## 2012-05-18 MED ORDER — ESOMEPRAZOLE MAGNESIUM 40 MG PO CPDR: 40.0000 mg | DELAYED_RELEASE_CAPSULE | ORAL | Status: DC

## 2012-07-15 ENCOUNTER — Other Ambulatory Visit: Payer: Self-pay

## 2012-07-24 ENCOUNTER — Other Ambulatory Visit: Payer: Self-pay | Admitting: *Deleted

## 2012-07-24 MED ORDER — ACYCLOVIR 5 % EX CREA: 1.0000 "application " | TOPICAL_CREAM | CUTANEOUS | Status: AC | PRN

## 2012-07-24 MED ORDER — ACYCLOVIR 200 MG PO CAPS: 200.0000 mg | ORAL_CAPSULE | ORAL | Status: DC | PRN

## 2012-07-24 NOTE — Telephone Encounter (Signed)
Please give her 5 refils, thanks

## 2012-07-31 ENCOUNTER — Ambulatory Visit (INDEPENDENT_AMBULATORY_CARE_PROVIDER_SITE_OTHER): Payer: BC Managed Care – PPO | Admitting: Family Medicine

## 2012-07-31 ENCOUNTER — Encounter: Payer: Self-pay | Admitting: Family Medicine

## 2012-07-31 VITALS — BP 110/74 | HR 72 | Temp 97.7°F | Wt 172.8 lb

## 2012-07-31 DIAGNOSIS — N390 Urinary tract infection, site not specified: Secondary | ICD-10-CM

## 2012-07-31 DIAGNOSIS — R300 Dysuria: Secondary | ICD-10-CM

## 2012-07-31 LAB — POCT URINALYSIS DIPSTICK
Glucose, UA: NEGATIVE
Nitrite, UA: NEGATIVE
Protein, UA: NEGATIVE
Urobilinogen, UA: 0.2

## 2012-07-31 MED ORDER — CIPROFLOXACIN HCL 250 MG PO TABS: 250.0000 mg | ORAL_TABLET | Freq: Two times a day (BID) | ORAL | Status: DC

## 2012-07-31 NOTE — Assessment & Plan Note (Signed)
UA/micro and sxs consistent with UTI. Treat with cipro 250mg  bid x5 days. Update if not improving with treatment.

## 2012-07-31 NOTE — Patient Instructions (Addendum)
Treat UTI with cipro twice daily for 5 days - 250mg . Let us know if not improving after 3rd day, or any worsening.  Urinary Tract Infection Urinary tract infections (UTIs) can develop anywhere along your urinary tract. Your urinary tract is your body's drainage system for removing wastes and extra water. Your urinary tract includes two kidneys, two ureters, a bladder, and a urethra. Your kidneys are a pair of bean-shaped organs. Each kidney is about the size of your fist. They are located below your ribs, one on each side of your spine. CAUSES Infections are caused by microbes, which are microscopic organisms, including fungi, viruses, and bacteria. These organisms are so small that they can only be seen through a microscope. Bacteria are the microbes that most commonly cause UTIs. SYMPTOMS  Symptoms of UTIs may vary by age and gender of the patient and by the location of the infection. Symptoms in young women typically include a frequent and intense urge to urinate and a painful, burning feeling in the bladder or urethra during urination. Older women and men are more likely to be tired, shaky, and weak and have muscle aches and abdominal pain. A fever may mean the infection is in your kidneys. Other symptoms of a kidney infection include pain in your back or sides below the ribs, nausea, and vomiting. DIAGNOSIS To diagnose a UTI, your caregiver will ask you about your symptoms. Your caregiver also will ask to provide a urine sample. The urine sample will be tested for bacteria and white blood cells. White blood cells are made by your body to help fight infection. TREATMENT  Typically, UTIs can be treated with medication. Because most UTIs are caused by a bacterial infection, they usually can be treated with the use of antibiotics. The choice of antibiotic and length of treatment depend on your symptoms and the type of bacteria causing your infection. HOME CARE INSTRUCTIONS  If you were prescribed  antibiotics, take them exactly as your caregiver instructs you. Finish the medication even if you feel better after you have only taken some of the medication.  Drink enough water and fluids to keep your urine clear or pale yellow.  Avoid caffeine, tea, and carbonated beverages. They tend to irritate your bladder.  Empty your bladder often. Avoid holding urine for long periods of time.  Empty your bladder before and after sexual intercourse.  After a bowel movement, women should cleanse from front to back. Use each tissue only once. SEEK MEDICAL CARE IF:   You have back pain.  You develop a fever.  Your symptoms do not begin to resolve within 3 days. SEEK IMMEDIATE MEDICAL CARE IF:   You have severe back pain or lower abdominal pain.  You develop chills.  You have nausea or vomiting.  You have continued burning or discomfort with urination. MAKE SURE YOU:   Understand these instructions.  Will watch your condition.  Will get help right away if you are not doing well or get worse. Document Released: 07/03/2005 Document Revised: 03/24/2012 Document Reviewed: 11/01/2011 [facility] Patient Information 2013 [place], [place].

## 2012-07-31 NOTE — Progress Notes (Signed)
  Subjective:    Patient ID: [name] [name], female    DOB: November 17, 1950, 61 y.o.   MRN: [id number]  HPI CC: ?UTI  1d h/o urinary sxs including dysuria, not fully emptying, frequency.  Mild suprapubic pain and lower back pain.  No fevers/chills, n/v, hematuria, urgency.  Last UTI was 01/2012.  Past Medical History  Diagnosis Date  . Melanoma of skin   . Basal cell cancer 5/08  . Hyperlipidemia DIET CONTROL  . GERD (gastroesophageal reflux disease)     HH  . Eczema   . Chest pain 9/11    [facility]) and palpitations - ruled out for MI with nl. Echo  . History of abdominal pain  2011--- DUE TO IMPACTION PER XRAY  . Chronic fatigue PT DENIES  . Heart palpitations OCCASIONAL--  TAKES ATENOLOL PRN  . Normal nuclear stress test 07-09-10  . Arthritis     NECK  . Acute meniscal tear of knee LEFT  . Complication of anesthesia HARD TO WAKE  . Hiatal hernia   . Osteopenia   . HSV-2 infection     buttocks     Review of Systems Per HPI    Objective:   Physical Exam  Nursing note and vitals reviewed. Constitutional: She appears well-developed and well-nourished. No distress.  Abdominal: Soft. Bowel sounds are normal. She exhibits no distension and no mass. There is no hepatosplenomegaly. There is no tenderness. There is no rebound, no guarding and no CVA tenderness.  Musculoskeletal: She exhibits no edema.  Psychiatric: She has a normal mood and affect.       Assessment & Plan:

## 2012-08-05 ENCOUNTER — Telehealth: Payer: Self-pay

## 2012-08-05 NOTE — Telephone Encounter (Signed)
Pt already has a ortho. Office she goes to so she said she will just call them and she doesn't need a referral

## 2012-08-05 NOTE — Telephone Encounter (Signed)
Has to see orthopedics- does she need a ref and if so does she have a practice she prefers?

## 2012-08-05 NOTE — Telephone Encounter (Signed)
Pt has Baker's cyst behind lt knee; pt has had since first of year, now when pt walks hurts where cyst is located. Wants to know if Dr Milinda Antis can remove or will pt need to see specialist.

## 2012-10-12 ENCOUNTER — Ambulatory Visit (INDEPENDENT_AMBULATORY_CARE_PROVIDER_SITE_OTHER): Payer: BC Managed Care – PPO | Admitting: *Deleted

## 2012-10-12 DIAGNOSIS — Z23 Encounter for immunization: Secondary | ICD-10-CM

## 2012-11-15 ENCOUNTER — Telehealth: Payer: Self-pay | Admitting: Family Medicine

## 2012-11-15 DIAGNOSIS — E559 Vitamin D deficiency, unspecified: Secondary | ICD-10-CM

## 2012-11-15 DIAGNOSIS — Z0000 Encounter for general adult medical examination without abnormal findings: Secondary | ICD-10-CM

## 2012-11-15 DIAGNOSIS — E7800 Pure hypercholesterolemia, unspecified: Secondary | ICD-10-CM

## 2012-11-15 DIAGNOSIS — M899 Disorder of bone, unspecified: Secondary | ICD-10-CM

## 2012-11-15 NOTE — Telephone Encounter (Signed)
Message copied by Judy Pimple on Sun Nov 15, 2012  8:02 PM ------      Message from: Alvina Chou      Created: Mon Nov 09, 2012 12:31 PM      Regarding: lab orders for Monday, 2.10.14       Patient is scheduled for CPX labs, please order future labs, Thanks , Terri ------

## 2012-11-15 NOTE — Telephone Encounter (Signed)
Future labs 

## 2012-11-16 ENCOUNTER — Other Ambulatory Visit (INDEPENDENT_AMBULATORY_CARE_PROVIDER_SITE_OTHER): Payer: BC Managed Care – PPO

## 2012-11-16 DIAGNOSIS — E7800 Pure hypercholesterolemia, unspecified: Secondary | ICD-10-CM

## 2012-11-16 DIAGNOSIS — Z0000 Encounter for general adult medical examination without abnormal findings: Secondary | ICD-10-CM

## 2012-11-16 DIAGNOSIS — M899 Disorder of bone, unspecified: Secondary | ICD-10-CM

## 2012-11-16 DIAGNOSIS — E559 Vitamin D deficiency, unspecified: Secondary | ICD-10-CM

## 2012-11-16 LAB — CBC WITH DIFFERENTIAL/PLATELET
Basophils Relative: 0.4 % (ref 0.0–3.0)
Eosinophils Absolute: 0.1 10*3/uL (ref 0.0–0.7)
Lymphocytes Relative: 27.8 % (ref 12.0–46.0)
MCHC: 34.6 g/dL (ref 30.0–36.0)
Monocytes Absolute: 0.3 10*3/uL (ref 0.1–1.0)
Neutrophils Relative %: 64.5 % (ref 43.0–77.0)
Platelets: 200 10*3/uL (ref 150.0–400.0)
RBC: 4.57 Mil/uL (ref 3.87–5.11)
WBC: 4.9 10*3/uL (ref 4.5–10.5)

## 2012-11-16 LAB — LIPID PANEL
LDL Cholesterol: 108 mg/dL — ABNORMAL HIGH (ref 0–99)
Total CHOL/HDL Ratio: 3
Triglycerides: 85 mg/dL (ref 0.0–149.0)

## 2012-11-16 LAB — COMPREHENSIVE METABOLIC PANEL
ALT: 20 U/L (ref 0–35)
AST: 25 U/L (ref 0–37)
Albumin: 4 g/dL (ref 3.5–5.2)
Alkaline Phosphatase: 73 U/L (ref 39–117)
BUN: 12 mg/dL (ref 6–23)
Calcium: 9.3 mg/dL (ref 8.4–10.5)
Chloride: 104 mEq/L (ref 96–112)
Potassium: 4.1 mEq/L (ref 3.5–5.1)
Sodium: 140 mEq/L (ref 135–145)
Total Protein: 7.1 g/dL (ref 6.0–8.3)

## 2012-11-16 LAB — TSH: TSH: 1.69 u[IU]/mL (ref 0.35–5.50)

## 2012-11-17 LAB — VITAMIN D 25 HYDROXY (VIT D DEFICIENCY, FRACTURES): Vit D, 25-Hydroxy: 35 ng/mL (ref 30–89)

## 2012-11-23 ENCOUNTER — Ambulatory Visit (INDEPENDENT_AMBULATORY_CARE_PROVIDER_SITE_OTHER): Payer: BC Managed Care – PPO | Admitting: Family Medicine

## 2012-11-23 ENCOUNTER — Encounter: Payer: Self-pay | Admitting: Family Medicine

## 2012-11-23 VITALS — BP 112/68 | HR 65 | Temp 97.5°F | Ht 64.75 in | Wt 168.0 lb

## 2012-11-23 DIAGNOSIS — E7800 Pure hypercholesterolemia, unspecified: Secondary | ICD-10-CM

## 2012-11-23 DIAGNOSIS — E559 Vitamin D deficiency, unspecified: Secondary | ICD-10-CM

## 2012-11-23 DIAGNOSIS — M899 Disorder of bone, unspecified: Secondary | ICD-10-CM

## 2012-11-23 DIAGNOSIS — Z0000 Encounter for general adult medical examination without abnormal findings: Secondary | ICD-10-CM

## 2012-11-23 MED ORDER — ESOMEPRAZOLE MAGNESIUM 40 MG PO CPDR: 40.0000 mg | DELAYED_RELEASE_CAPSULE | ORAL | Status: DC

## 2012-11-23 NOTE — Assessment & Plan Note (Signed)
D level is 35 Disc imp to bone and overall health She will inc dose to 2000 iu daily

## 2012-11-23 NOTE — Patient Instructions (Addendum)
I'm glad you are doing well  Keep working with the exercise bike to help your knee  Eat a healthy diet and stay active  Don't forget to schedule your mammogram in the spring

## 2012-11-23 NOTE — Assessment & Plan Note (Signed)
Followed by her gyn 11/11 last dexa Will inc her D to 2000 iu daily  Exercise is good

## 2012-11-23 NOTE — Assessment & Plan Note (Signed)
Disc goals for lipids and reasons to control them Rev labs with pt Rev low sat fat diet in detail   

## 2012-11-23 NOTE — Progress Notes (Signed)
Subjective:    Patient ID: [name] [name], female    DOB: 1951-09-16, 62 y.o.   MRN: [id number]  HPI Here for health maintenance exam and to review chronic medical problems    Is doing well overall   Wt is down 4 lb with bmi of 28 Is trying to loose Eating a healthy diet  Had knee surgery-did not do very well with that- meniscal repair/ still hurts  Is using the exercise bike (not a lot of walking)- she does clean houses   Zoster status- has not decided if she wants to get the vaccine yet- will think about it   Had her flu shot this season   mammo 3/13-already sent reminder- [facility]- will make her own appt  Self exam-no lumps   Gyn exam last summer -no pap  Pap4/12  Will f/u in the summer with her gyn Had total hyst for endometrial issue (mother had uterine ca)  colonosc 3/06 No fam hx or problems   Osteopenia  dexa 11/11 - will get it at her gyn  D level is 35 Will take more vit D Takes calcium    Lipid Lab Results  Component Value Date   CHOL 178 11/16/2012   CHOL 181 09/17/2011   CHOL 205* 09/12/2010   Lab Results  Component Value Date   HDL 53.20 11/16/2012   HDL 40.98 09/17/2011   HDL 48.00 09/12/2010   Lab Results  Component Value Date   LDLCALC 108* 11/16/2012   LDLCALC 112* 09/17/2011   LDLCALC 111* 04/21/2009   Lab Results  Component Value Date   TRIG 85.0 11/16/2012   TRIG 81.0 09/17/2011   TRIG 119.0 09/12/2010   Lab Results  Component Value Date   CHOLHDL 3 11/16/2012   CHOLHDL 3 09/17/2011   CHOLHDL 4 09/12/2010   Lab Results  Component Value Date   LDLDIRECT 124.4 09/12/2010   overall stable and well controlled   Patient Active Problem List  Diagnosis  . GENITAL HERPES- BREAKOUT BUTTOCK  . UNSPECIFIED VITAMIN D DEFICIENCY  . HYPERCHOLESTEROLEMIA, PURE  . EXTERNAL HEMORRHOIDS WITHOUT MENTION COMP  . OTHER CHRONIC SINUSITIS  . ALLERGIC RHINITIS  . GERD  . HIATAL HERNIA  . OSTEOPENIA  . ABNORMAL EKG  . STRESS  REACTION, ACUTE, WITH EMOTIONAL DISTURBANCE  . THROAT PAIN, CHRONIC  . DERMATITIS, ATOPIC  . URTICARIA DUE TO COLD OR HEAT  . Routine general medical examination at a health care facility   Past Medical History  Diagnosis Date  . Melanoma of skin   . Basal cell cancer 5/08  . Hyperlipidemia DIET CONTROL  . GERD (gastroesophageal reflux disease)     HH  . Eczema   . Chest pain 9/11    [facility]) and palpitations - ruled out for MI with nl. Echo  . History of abdominal pain  2011--- DUE TO IMPACTION PER XRAY  . Chronic fatigue PT DENIES  . Heart palpitations OCCASIONAL--  TAKES ATENOLOL PRN  . Normal nuclear stress test 07-09-10  . Arthritis     NECK  . Acute meniscal tear of knee LEFT  . Complication of anesthesia HARD TO WAKE  . Hiatal hernia   . Osteopenia   . HSV-2 infection     buttocks   Past Surgical History  Procedure Laterality Date  . Mri neck  3/00    C4-C5 herniation small stenosis mild C4-5, C5-6  . Melanoma excision      TRUNK  . Carotid  US  9/05    Mild, recheck 1 year  . Carotid US  10/06    Normal  . Esophagogastroduodenoscopy  9/07    Normal  . Abnormal mole removed from leg - ? basal cell    . Hysteroscopy with resectoscope  2000    POLPECTOMY'S  . Tubal ligation  23 YRS AGO  . Tonsillectomy    . Transthoracic echocardiogram  06-29-2010    NORMAL LVSF, EF 55-60%,  MILD MR  . Knee arthroscopy  10/23/2011    Procedure: ARTHROSCOPY KNEE;  Surgeon: Loanne Drilling, MD;  Location: [facility];  Service: Orthopedics;  Laterality: Left;  LEFT KNEE SCOPE WITH DEBRIDEMENT   . Chondroplasty  10/23/2011    Procedure: CHONDROPLASTY;  Surgeon: Loanne Drilling, MD;  Location: [facility];  Service: Orthopedics;  Laterality: Left;  Left medial   . Vaginal hysterectomy  2001    complex hyperplasia with mother's history of uterine cancer   History  Substance Use Topics  . Smoking status: Former Smoker    Types: Cigarettes    Quit  date: 10/07/1977  . Smokeless tobacco: Never Used  . Alcohol Use: No   Family History  Problem Relation Age of Onset  . Hypertension Mother   . Cancer Mother     uterine  . Obesity Mother   . Thrombocytopenia Mother   . Diabetes Mother   . Alcohol abuse Father   . Heart disease Father     CAD in 69's  . Emphysema Father   . Heart disease Paternal Aunt     CAD  . Heart disease Paternal Uncle     CAD  . Colon cancer Neg Hx   . Diabetes Maternal Grandmother     type 2  . Diabetes Maternal Grandfather     type 2   Allergies  Allergen Reactions  . Amoxicillin Rash  . Codeine Rash  . Clarithromycin     REACTION: reaction not known   Current Outpatient Prescriptions on File Prior to Visit  Medication Sig Dispense Refill  . 5-Hydroxytryptophan (5-HTP) 100 MG CAPS Take 1 capsule by mouth daily.       Marland Kitchen acyclovir (ZOVIRAX) 200 MG capsule Take 1 capsule (200 mg total) by mouth as needed. Take 200 mg by mouth twice weekly as needed.  20 capsule  3  . acyclovir cream (ZOVIRAX) 5 % Apply 1 application topically as needed.  15 g  3  . aspirin 81 MG tablet Take 81 mg by mouth daily.       . calcium carbonate (OS-CAL) 600 MG TABS Take 600 mg by mouth daily.       . Cholecalciferol (VITAMIN D) 1000 UNITS capsule Take 1,000 Units by mouth daily.       . Cranberry 1000 MG CAPS Take 1 capsule by mouth daily. ? Strength.  Take one by mouth daily.      . diphenhydrAMINE (BENADRYL) 25 mg capsule Take 25 mg by mouth every 6 (six) hours as needed.      Marland Kitchen esomeprazole (NEXIUM) 40 MG capsule Take 1 capsule (40 mg total) by mouth every morning.  30 capsule  4  . hydrocortisone (ANUSOL-HC) 2.5 % rectal cream Place 1 application rectally as needed. Apply to affected area once daily as needed for hemorrhoids.      . Hydrocortisone Butyr Lipo Base (LOCOID LIPOCREAM) 0.1 % CREA Apply to affected areas once daily as needed.       Marland Kitchen  magnesium gluconate (MAGONATE) 500 MG tablet Take 500 mg by mouth daily.       . Multiple Vitamin (MULTIVITAMIN) tablet Take 1 tablet by mouth daily.        No current facility-administered medications on file prior to visit.    Review of Systems Review of Systems  Constitutional: Negative for fever, appetite change, fatigue and unexpected weight change.  Eyes: Negative for pain and visual disturbance.  Respiratory: Negative for cough and shortness of breath.   Cardiovascular: Negative for cp or palpitations    Gastrointestinal: Negative for nausea, diarrhea and constipation.  Genitourinary: Negative for urgency and frequency.  Skin: Negative for pallor or rash   Neurological: Negative for weakness, light-headedness, numbness and headaches.  Hematological: Negative for adenopathy. Does not bruise/bleed easily.  Psychiatric/Behavioral: Negative for dysphoric mood. The patient is not nervous/anxious.         Objective:   Physical Exam  Constitutional: She appears well-developed and well-nourished. No distress.  HENT:  Head: Normocephalic and atraumatic.  Right Ear: External ear normal.  Left Ear: External ear normal.  Nose: Nose normal.  Mouth/Throat: Oropharynx is clear and moist.  Eyes: Conjunctivae and EOM are normal. Pupils are equal, round, and reactive to light. Right eye exhibits no discharge. Left eye exhibits no discharge. No scleral icterus.  Neck: Normal range of motion. Neck supple. No JVD present. Carotid bruit is not present. No thyromegaly present.  Cardiovascular: Normal rate, regular rhythm, normal heart sounds and intact distal pulses.  Exam reveals no gallop.   Pulmonary/Chest: Effort normal and breath sounds normal. No respiratory distress. She has no wheezes. She exhibits no tenderness.  Abdominal: Soft. Bowel sounds are normal. She exhibits no distension, no abdominal bruit and no mass. There is no tenderness.  Musculoskeletal: Normal range of motion. She exhibits no edema and no tenderness.  Lymphadenopathy:    She has no cervical  adenopathy.  Neurological: She is alert. She has normal reflexes. No cranial nerve deficit. She exhibits normal muscle tone. Coordination normal.  Skin: Skin is warm and dry. No rash noted. No erythema. No pallor.  Psychiatric: She has a normal mood and affect.          Assessment & Plan:

## 2012-11-23 NOTE — Assessment & Plan Note (Signed)
Reviewed health habits including diet and exercise and skin cancer prevention Also reviewed health mt list, fam hx and immunizations  Rev wellness labs  Commended on slow and steady wt loss

## 2013-02-26 ENCOUNTER — Other Ambulatory Visit: Payer: Self-pay

## 2013-02-26 DIAGNOSIS — Z1231 Encounter for screening mammogram for malignant neoplasm of breast: Secondary | ICD-10-CM

## 2013-03-10 ENCOUNTER — Ambulatory Visit: Payer: BC Managed Care – PPO

## 2013-04-07 ENCOUNTER — Ambulatory Visit
Admission: RE | Admit: 2013-04-07 | Discharge: 2013-04-07 | Disposition: A | Discharge status: Home / Self Care | Payer: BC Managed Care – PPO | Source: Ambulatory Visit

## 2013-04-07 DIAGNOSIS — Z1231 Encounter for screening mammogram for malignant neoplasm of breast: Secondary | ICD-10-CM

## 2013-04-14 ENCOUNTER — Other Ambulatory Visit: Payer: Self-pay | Admitting: Gynecology

## 2013-04-15 ENCOUNTER — Other Ambulatory Visit: Payer: Self-pay | Admitting: *Deleted

## 2013-04-15 DIAGNOSIS — N63 Unspecified lump in unspecified breast: Secondary | ICD-10-CM

## 2013-04-16 ENCOUNTER — Telehealth: Payer: Self-pay | Admitting: *Deleted

## 2013-04-16 NOTE — Telephone Encounter (Signed)
Pt had questions about additional imaging needed for mammogram.  All questions answered pt will have testing on 04/26/13.

## 2013-04-22 ENCOUNTER — Other Ambulatory Visit: Payer: Self-pay | Admitting: Gynecology

## 2013-04-22 ENCOUNTER — Ambulatory Visit
Admission: RE | Admit: 2013-04-22 | Discharge: 2013-04-22 | Disposition: A | Discharge status: Home / Self Care | Payer: BC Managed Care – PPO | Source: Ambulatory Visit | Attending: Gynecology | Admitting: Gynecology

## 2013-04-22 DIAGNOSIS — N63 Unspecified lump in unspecified breast: Secondary | ICD-10-CM

## 2013-04-22 DIAGNOSIS — N6320 Unspecified lump in the left breast, unspecified quadrant: Secondary | ICD-10-CM

## 2013-04-26 ENCOUNTER — Other Ambulatory Visit: Payer: BC Managed Care – PPO

## 2013-04-28 ENCOUNTER — Other Ambulatory Visit: Payer: BC Managed Care – PPO

## 2013-05-11 ENCOUNTER — Encounter: Payer: Self-pay | Admitting: Gynecology

## 2013-05-19 ENCOUNTER — Encounter: Payer: Self-pay | Admitting: Gynecology

## 2013-05-19 ENCOUNTER — Ambulatory Visit (INDEPENDENT_AMBULATORY_CARE_PROVIDER_SITE_OTHER): Payer: BC Managed Care – PPO | Admitting: Gynecology

## 2013-05-19 DIAGNOSIS — N762 Acute vulvitis: Secondary | ICD-10-CM

## 2013-05-19 DIAGNOSIS — N760 Acute vaginitis: Secondary | ICD-10-CM

## 2013-05-19 DIAGNOSIS — N898 Other specified noninflammatory disorders of vagina: Secondary | ICD-10-CM

## 2013-05-19 DIAGNOSIS — L293 Anogenital pruritus, unspecified: Secondary | ICD-10-CM

## 2013-05-19 LAB — WET PREP FOR TRICH, YEAST, CLUE
Trich, Wet Prep: NONE SEEN
Yeast Wet Prep HPF POC: NONE SEEN

## 2013-05-19 MED ORDER — NYSTATIN-TRIAMCINOLONE 100000-0.1 UNIT/GM-% EX OINT: TOPICAL_OINTMENT | Freq: Two times a day (BID) | CUTANEOUS | Status: DC

## 2013-05-19 NOTE — Patient Instructions (Signed)
Apply vaginal cream externally twice daily as needed for itching. Followup if symptoms persist, worsen or recur.

## 2013-05-19 NOTE — Progress Notes (Signed)
Patient presents with one week of vulvar itching. No discharge odor or other symptoms.  Exam was Radiographer, therapeutic. External BUS vagina grossly normal. Atrophic changes noted. Bimanual without masses or tenderness.  Assessment and plan: Vulvar itching. Wet prep negative. Suspect yeast vulvitis. Will cover with Mycolog externally twice a day as needed. Followup if symptoms persist, worsen or recur.

## 2013-05-27 ENCOUNTER — Telehealth: Payer: Self-pay | Admitting: *Deleted

## 2013-05-27 MED ORDER — FLUCONAZOLE 200 MG PO TABS: 200.0000 mg | ORAL_TABLET | Freq: Every day | ORAL | Status: DC

## 2013-05-27 NOTE — Telephone Encounter (Signed)
Diflucan 200 mg daily x3 days 

## 2013-05-27 NOTE — Addendum Note (Signed)
Addended by: Aura Camps on: 05/27/2013 03:30 PM   Modules accepted: Orders

## 2013-05-27 NOTE — Telephone Encounter (Signed)
Pt calling to follow up from OV 05/19/13 she has been using mycology ointment x 1 week now and vulvar itching still there. Pt asked if maybe diflucan pill could be given? Please advise

## 2013-05-27 NOTE — Telephone Encounter (Signed)
rx sent, pt informed.  

## 2013-06-14 ENCOUNTER — Encounter: Payer: Self-pay | Admitting: Family Medicine

## 2013-06-14 ENCOUNTER — Ambulatory Visit (INDEPENDENT_AMBULATORY_CARE_PROVIDER_SITE_OTHER): Payer: BC Managed Care – PPO | Admitting: Family Medicine

## 2013-06-14 VITALS — BP 120/72 | HR 67 | Temp 97.7°F | Ht 64.75 in | Wt 172.5 lb

## 2013-06-14 DIAGNOSIS — R300 Dysuria: Secondary | ICD-10-CM

## 2013-06-14 DIAGNOSIS — N390 Urinary tract infection, site not specified: Secondary | ICD-10-CM

## 2013-06-14 LAB — POCT UA - MICROSCOPIC ONLY

## 2013-06-14 LAB — POCT URINALYSIS DIPSTICK
Bilirubin, UA: NEGATIVE
Ketones, UA: NEGATIVE
Protein, UA: NEGATIVE
Spec Grav, UA: <=1.005
pH, UA: 7

## 2013-06-14 MED ORDER — CIPROFLOXACIN HCL 250 MG PO TABS: 250.0000 mg | ORAL_TABLET | Freq: Two times a day (BID) | ORAL | Status: DC

## 2013-06-14 MED ORDER — HYDROCORTISONE 2.5 % RE CREA: 1.0000 "application " | TOPICAL_CREAM | RECTAL | Status: DC | PRN

## 2013-06-14 NOTE — Assessment & Plan Note (Signed)
Cover with low dose cipro for 5d Uncomplicated Disc avoidance of diet/ carbonated beverages as these seem to worsen her symptoms  Will inc water intake Update if not starting to improve in several days  or if worsening

## 2013-06-14 NOTE — Progress Notes (Signed)
Subjective:    Patient ID: [name] [name], female    DOB: 1951-06-25, 62 y.o.   MRN: [id number]  HPI Here with urinary symptoms  Came and went last week  Started to drink lots of water  She is intolerant to sodas these days   Having dysuria , and frequency and urgency  Bladder pressure and pain  No blood in urine  No flank pain   Urine is hazy, no odor   No nausea or fever  No appetite   Patient Active Problem List   Diagnosis Date Noted  . Routine general medical examination at a health care facility 09/16/2011  . DERMATITIS, ATOPIC 11/27/2010  . URTICARIA DUE TO COLD OR HEAT 11/27/2010  . STRESS REACTION, ACUTE, WITH EMOTIONAL DISTURBANCE 09/14/2010  . THROAT PAIN, CHRONIC 09/14/2010  . EXTERNAL HEMORRHOIDS WITHOUT MENTION COMP 06/25/2010  . ABNORMAL EKG 12/26/2009  . OTHER CHRONIC SINUSITIS 10/19/2009  . UNSPECIFIED VITAMIN D DEFICIENCY 04/21/2009  . ALLERGIC RHINITIS 11/09/2008  . GERD 07/28/2008  . OSTEOPENIA 03/14/2008  . HYPERCHOLESTEROLEMIA, PURE 03/13/2007  . HIATAL HERNIA 01/12/2007  . GENITAL HERPES- BREAKOUT BUTTOCK 12/30/2006   Past Medical History  Diagnosis Date  . Melanoma of skin   . Basal cell cancer 5/08  . Hyperlipidemia DIET CONTROL  . GERD (gastroesophageal reflux disease)     HH  . Eczema   . Chest pain 9/11    [facility]) and palpitations - ruled out for MI with nl. Echo  . History of abdominal pain  2011--- DUE TO IMPACTION PER XRAY  . Chronic fatigue PT DENIES  . Heart palpitations OCCASIONAL--  TAKES ATENOLOL PRN  . Normal nuclear stress test 07-09-10  . Arthritis     NECK  . Acute meniscal tear of knee LEFT  . Complication of anesthesia HARD TO WAKE  . Hiatal hernia   . Osteopenia   . HSV-2 infection     buttocks   Past Surgical History  Procedure Laterality Date  . Mri neck  3/00    C4-C5 herniation small stenosis mild C4-5, C5-6  . Melanoma excision      TRUNK  . Carotid US  9/05    Mild, recheck 1 year  . Carotid US   10/06    Normal  . Esophagogastroduodenoscopy  9/07    Normal  . Abnormal mole removed from leg - ? basal cell    . Hysteroscopy with resectoscope  2000    POLPECTOMY'S  . Tubal ligation  23 YRS AGO  . Tonsillectomy    . Transthoracic echocardiogram  06-29-2010    NORMAL LVSF, EF 55-60%,  MILD MR  . Knee arthroscopy  10/23/2011    Procedure: ARTHROSCOPY KNEE;  Surgeon: Loanne Drilling, MD;  Location: [facility];  Service: Orthopedics;  Laterality: Left;  LEFT KNEE SCOPE WITH DEBRIDEMENT   . Chondroplasty  10/23/2011    Procedure: CHONDROPLASTY;  Surgeon: Loanne Drilling, MD;  Location: [facility];  Service: Orthopedics;  Laterality: Left;  Left medial   . Vaginal hysterectomy  2001    complex hyperplasia with mother's history of uterine cancer   History  Substance Use Topics  . Smoking status: Former Smoker    Types: Cigarettes    Quit date: 10/07/1977  . Smokeless tobacco: Never Used  . Alcohol Use: No   Family History  Problem Relation Age of Onset  . Hypertension Mother   . Cancer Mother     uterine  . Obesity  Mother   . Thrombocytopenia Mother   . Diabetes Mother   . Alcohol abuse Father   . Heart disease Father     CAD in 33's  . Emphysema Father   . Heart disease Paternal Aunt     CAD  . Heart disease Paternal Uncle     CAD  . Colon cancer Neg Hx   . Diabetes Maternal Grandmother     type 2  . Diabetes Maternal Grandfather     type 2   Allergies  Allergen Reactions  . Amoxicillin Rash  . Codeine Rash  . Clarithromycin     REACTION: reaction not known   Current Outpatient Prescriptions on File Prior to Visit  Medication Sig Dispense Refill  . 5-Hydroxytryptophan (5-HTP) 100 MG CAPS Take 1 capsule by mouth daily.       Marland Kitchen acyclovir (ZOVIRAX) 200 MG capsule Take 1 capsule (200 mg total) by mouth as needed. Take 200 mg by mouth twice weekly as needed.  20 capsule  3  . acyclovir cream (ZOVIRAX) 5 % Apply 1 application  topically as needed.  15 g  3  . aspirin 81 MG tablet Take 81 mg by mouth daily.       . calcium carbonate (OS-CAL) 600 MG TABS Take 600 mg by mouth daily.       . Cholecalciferol (VITAMIN D) 1000 UNITS capsule Take 1,000 Units by mouth daily.       . Cranberry 1000 MG CAPS Take 1 capsule by mouth daily. ? Strength.  Take one by mouth daily.      . diphenhydrAMINE (BENADRYL) 25 mg capsule Take 25 mg by mouth every 6 (six) hours as needed.      . hydrocortisone (ANUSOL-HC) 2.5 % rectal cream Place 1 application rectally as needed. Apply to affected area once daily as needed for hemorrhoids.      . Hydrocortisone Butyr Lipo Base (LOCOID LIPOCREAM) 0.1 % CREA Apply to affected areas once daily as needed.       . magnesium gluconate (MAGONATE) 500 MG tablet Take 500 mg by mouth daily.      . Multiple Vitamin (MULTIVITAMIN) tablet Take 1 tablet by mouth daily.        No current facility-administered medications on file prior to visit.      Review of Systems Review of Systems  Constitutional: Negative for fever, appetite change, fatigue and unexpected weight change.  Eyes: Negative for pain and visual disturbance.  Respiratory: Negative for cough and shortness of breath.   Cardiovascular: Negative for cp or palpitations    Gastrointestinal: Negative for nausea, diarrhea and constipation.  Genitourinary: pos for urgency and frequency. neg for flank pain or hematuria Skin: Negative for pallor or rash   Neurological: Negative for weakness, light-headedness, numbness and headaches.  Hematological: Negative for adenopathy. Does not bruise/bleed easily.  Psychiatric/Behavioral: Negative for dysphoric mood. The patient is not nervous/anxious.         Objective:   Physical Exam  Constitutional: She appears well-developed and well-nourished. No distress.  HENT:  Head: Normocephalic and atraumatic.  Eyes: Conjunctivae and EOM are normal. Pupils are equal, round, and reactive to light. Right eye  exhibits no discharge. Left eye exhibits no discharge. No scleral icterus.  Neck: Normal range of motion. Neck supple.  Cardiovascular: Normal rate and regular rhythm.   Abdominal: Soft. Bowel sounds are normal. She exhibits no distension and no mass. There is tenderness in the suprapubic area. There is no rebound, no  guarding and no CVA tenderness.  Lymphadenopathy:    She has no cervical adenopathy.  Neurological: She is alert.  Skin: Skin is warm and dry. No pallor.  Psychiatric: She has a normal mood and affect.          Assessment & Plan:

## 2013-06-14 NOTE — Patient Instructions (Addendum)
I think you have a urinary tract infection  Drink lots of water  Avoid sodas and other things that bother your bladder  Take the cipro as directed Update if not starting to improve in several days  or if worsening

## 2013-06-21 ENCOUNTER — Ambulatory Visit (INDEPENDENT_AMBULATORY_CARE_PROVIDER_SITE_OTHER): Payer: BC Managed Care – PPO | Admitting: Gynecology

## 2013-06-21 ENCOUNTER — Encounter: Payer: Self-pay | Admitting: Gynecology

## 2013-06-21 VITALS — BP 120/76 | Ht 65.0 in | Wt 171.0 lb

## 2013-06-21 DIAGNOSIS — Z01419 Encounter for gynecological examination (general) (routine) without abnormal findings: Secondary | ICD-10-CM

## 2013-06-21 DIAGNOSIS — M8580 Other specified disorders of bone density and structure, unspecified site: Secondary | ICD-10-CM

## 2013-06-21 DIAGNOSIS — M899 Disorder of bone, unspecified: Secondary | ICD-10-CM

## 2013-06-21 DIAGNOSIS — R5381 Other malaise: Secondary | ICD-10-CM

## 2013-06-21 DIAGNOSIS — R5383 Other fatigue: Secondary | ICD-10-CM

## 2013-06-21 LAB — CBC WITH DIFFERENTIAL/PLATELET
Basophils Absolute: 0 10*3/uL (ref 0.0–0.1)
Basophils Relative: 0 % (ref 0–1)
Eosinophils Absolute: 0.1 10*3/uL (ref 0.0–0.7)
Eosinophils Relative: 1 % (ref 0–5)
HCT: 43.6 % (ref 36.0–46.0)
Hemoglobin: 15.2 g/dL — ABNORMAL HIGH (ref 12.0–15.0)
MCH: 31.9 pg (ref 26.0–34.0)
MCHC: 34.9 g/dL (ref 30.0–36.0)
MCV: 91.6 fL (ref 78.0–100.0)
Monocytes Absolute: 0.4 10*3/uL (ref 0.1–1.0)
Monocytes Relative: 6 % (ref 3–12)
Neutro Abs: 3.5 10*3/uL (ref 1.7–7.7)
RDW: 13.6 % (ref 11.5–15.5)

## 2013-06-21 LAB — COMPREHENSIVE METABOLIC PANEL
AST: 24 U/L (ref 0–37)
Albumin: 4.6 g/dL (ref 3.5–5.2)
Alkaline Phosphatase: 78 U/L (ref 39–117)
BUN: 15 mg/dL (ref 6–23)
Creat: 0.83 mg/dL (ref 0.50–1.10)
Glucose, Bld: 60 mg/dL — ABNORMAL LOW (ref 70–99)
Potassium: 4 mEq/L (ref 3.5–5.3)

## 2013-06-21 NOTE — Patient Instructions (Addendum)
Follow up for skin biopsy appointment and bone density

## 2013-06-21 NOTE — Progress Notes (Addendum)
[name] [name] 1951-09-05 [id number]        62 y.o.  G3P3 for annual exam.  Several issues noted below.  Past medical history,surgical history, medications, allergies, family history and social history were all reviewed and documented in the EPIC chart.  ROS:  Performed and pertinent positives and negatives are included in the history, assessment and plan .  Exam: Kim assistant Filed Vitals:   06/21/13 0902  BP: 120/76  Height: 5\' 5"  (1.651 m)  Weight: 171 lb (77.565 kg)   General appearance  Normal Skin grossly normal Head/Neck normal with no cervical or supraclavicular adenopathy thyroid normal Lungs  clear Cardiac RR, without RMG Abdominal  soft, nontender, without masses, organomegaly or hernia Breasts  examined lying and sitting without masses, retractions, discharge or axillary adenopathy. Small raised skin papule right breast 2:00 position 2 fingerbreadths off the areola.  Pelvic  Ext/BUS/vagina  normal with mild atrophic changes  Adnexa  Without masses or tenderness    Anus and perineum  normal   Rectovaginal  normal sphincter tone without palpated masses or tenderness.    Assessment/Plan:  62 y.o. G3P3 female for annual exam.   1. Status post TVH 2001 for complex hyperplasia. Doing well without significant menopausal symptoms such as hot flashes night sweats vaginal dryness or dyspareunia. We'll continue to monitor. 2. Small benign-appearing papule right breast skin. Notes most recently developed. Does have history of basal cell carcinoma and melanoma and for this reason I recommended having this removed. Patient agrees and we'll arrange either through this office or her dermatology office. 3. Osteopenia. DEXA 2011 T score -1.5. FRAX 15%/0.6%. Repeat DEXA now. Increase calcium vitamin D reviewed. On 2000 units daily with vitamin D. level 11/2012 35. 4. Fatigue. Generalized fatigue. No weight changes hair changes skin changes. Will check baseline CBC comprehensive metabolic  panel thyroid panel. If persistent she will followup with her primary physician for further evaluation.   5. Pap smear 2012. No Pap smear done today. No history of abnormal Pap smears previously. Options to stop screening altogether she is status post hysterectomy for benign indications versus less frequent screening intervals reviewed. We'll readdress on an annual basis.  6. Mammography 04/2013.  small cyst noted on last ultrasound. Recommended repeat in one year. SBE monthly reviewed.  7. Colonoscopy 2006.  Reports due at 10 year interval.  8. Health maintenance. Actively sees her primary physician annually. No other lab work other than above done for fatigue. Will followup for her DEXA and skin biopsy.     Note: This document was prepared with digital dictation and possible smart phrase technology. Any transcriptional errors that result from this process are unintentional.   Dara Lords MD, 9:32 AM 06/21/2013

## 2013-06-22 LAB — THYROID PANEL
Free T4: 1.47 ng/dL (ref 0.80–1.80)
T3 Uptake: 36.4 % (ref 22.5–37.0)
TSH: 1.956 u[IU]/mL (ref 0.350–4.500)

## 2013-06-24 ENCOUNTER — Telehealth: Payer: Self-pay | Admitting: *Deleted

## 2013-06-24 NOTE — Telephone Encounter (Signed)
Pt called regarding lab results on 06/21/13 visit, pt was c/o feeling fatigue. I gave pt labs results she asked if anything should be done regarding abnormal results? Please advise

## 2013-06-24 NOTE — Telephone Encounter (Signed)
I think patient's lab results all looked normal. Her blood count and thyroid were both normal. Her glucose was a touch low which usually is just because she hadn't eaten for a while. If her fatigue continues I would followup with the primary physician for further evaluation.

## 2013-06-24 NOTE — Telephone Encounter (Signed)
Pt informed with the below note. 

## 2013-06-30 ENCOUNTER — Ambulatory Visit: Payer: BC Managed Care – PPO | Admitting: Gynecology

## 2013-07-21 ENCOUNTER — Ambulatory Visit: Payer: BC Managed Care – PPO | Admitting: Gynecology

## 2013-07-26 ENCOUNTER — Ambulatory Visit (INDEPENDENT_AMBULATORY_CARE_PROVIDER_SITE_OTHER): Payer: BC Managed Care – PPO | Admitting: Gynecology

## 2013-07-26 ENCOUNTER — Encounter: Payer: Self-pay | Admitting: Gynecology

## 2013-07-26 DIAGNOSIS — N649 Disorder of breast, unspecified: Secondary | ICD-10-CM

## 2013-07-26 NOTE — Patient Instructions (Signed)
Office will call you with biopsy results 

## 2013-07-26 NOTE — Progress Notes (Signed)
Patient ID: [name] [name], female   DOB: September 07, 1951, 62 y.o.   MRN: [id number] Patient presents for skin biopsy right breast skin. She has a small raised area developed within the last several months. History of basal cell carcinoma and melanoma. Patient desires removal.  Exam with Kim assistant Right breast with small red raised plaque-like lesion less than 5 mm 2:00 position 2 fingerbreadths from the areola. Physical Exam  Pulmonary/Chest:      Procedure: Skin overlying the lesion was cleansed with Betadine, and infiltrated with 1% lidocaine and the skin lesion was excised in its entirety and sent to pathology. Silver nitrate applied afterwards for hemostasis and a sterile band aid applied. Patient will followup for biopsy results within several days. Postop care instructions were given.

## 2013-07-30 ENCOUNTER — Other Ambulatory Visit: Payer: Self-pay | Admitting: Family Medicine

## 2013-07-30 MED ORDER — ACYCLOVIR 200 MG PO CAPS: 200.0000 mg | ORAL_CAPSULE | ORAL | Status: DC | PRN

## 2013-11-10 ENCOUNTER — Other Ambulatory Visit: Payer: Self-pay | Admitting: Family Medicine

## 2013-11-10 MED ORDER — ACYCLOVIR 5 % EX CREA: TOPICAL_CREAM | CUTANEOUS | Status: DC

## 2013-11-10 NOTE — Telephone Encounter (Signed)
Fax refill request, last refilled in 2012, please advise

## 2013-11-10 NOTE — Telephone Encounter (Signed)
Please refill times one  

## 2013-11-10 NOTE — Telephone Encounter (Signed)
done

## 2013-11-18 ENCOUNTER — Telehealth: Payer: Self-pay | Admitting: *Deleted

## 2013-11-18 NOTE — Telephone Encounter (Signed)
Dr. Glori Bickers received a fax from [place] saying pt needs surgical clearance from our office. Dr. Glori Bickers advise me that pt needs a Surgical Clearance appt (34min.)  Called pt to scheduled appt but no answer so I left a voicemail requesting pt to call our office back

## 2013-11-18 NOTE — Telephone Encounter (Signed)
Pt said she has a CPE on 11/29/12 and will just discuss getting cleared at that appt, form placed in your pile of f/u appt papers

## 2013-11-18 NOTE — Telephone Encounter (Signed)
That sounds fine

## 2013-11-21 ENCOUNTER — Telehealth: Payer: Self-pay | Admitting: Family Medicine

## 2013-11-21 DIAGNOSIS — M949 Disorder of cartilage, unspecified: Principal | ICD-10-CM

## 2013-11-21 DIAGNOSIS — Z0000 Encounter for general adult medical examination without abnormal findings: Secondary | ICD-10-CM

## 2013-11-21 DIAGNOSIS — E559 Vitamin D deficiency, unspecified: Secondary | ICD-10-CM

## 2013-11-21 DIAGNOSIS — M899 Disorder of bone, unspecified: Secondary | ICD-10-CM

## 2013-11-21 NOTE — Telephone Encounter (Signed)
Message copied by Abner Greenspan on Sun Nov 21, 2013  1:19 PM ------      Message from: Ellamae Sia      Created: Thu Nov 18, 2013 11:47 AM      Regarding: lab orders for Monday, 2.16.15       Patient is scheduled for CPX labs, please order future labs, Thanks , Terri       ------

## 2013-11-22 ENCOUNTER — Other Ambulatory Visit (INDEPENDENT_AMBULATORY_CARE_PROVIDER_SITE_OTHER): Payer: Managed Care, Other (non HMO)

## 2013-11-22 DIAGNOSIS — M949 Disorder of cartilage, unspecified: Secondary | ICD-10-CM

## 2013-11-22 DIAGNOSIS — Z0000 Encounter for general adult medical examination without abnormal findings: Secondary | ICD-10-CM

## 2013-11-22 DIAGNOSIS — E559 Vitamin D deficiency, unspecified: Secondary | ICD-10-CM

## 2013-11-22 DIAGNOSIS — M899 Disorder of bone, unspecified: Secondary | ICD-10-CM

## 2013-11-22 DIAGNOSIS — E7800 Pure hypercholesterolemia, unspecified: Secondary | ICD-10-CM

## 2013-11-22 LAB — COMPREHENSIVE METABOLIC PANEL
ALK PHOS: 67 U/L (ref 39–117)
ALT: 19 U/L (ref 0–35)
AST: 20 U/L (ref 0–37)
Albumin: 4.1 g/dL (ref 3.5–5.2)
BILIRUBIN TOTAL: 0.7 mg/dL (ref 0.3–1.2)
BUN: 16 mg/dL (ref 6–23)
CO2: 28 mEq/L (ref 19–32)
Calcium: 9.6 mg/dL (ref 8.4–10.5)
Chloride: 105 mEq/L (ref 96–112)
Creatinine, Ser: 1 mg/dL (ref 0.4–1.2)
GFR: 63.42 mL/min (ref >60.00)
GLUCOSE: 76 mg/dL (ref 70–99)
Potassium: 4 mEq/L (ref 3.5–5.1)
Sodium: 140 mEq/L (ref 135–145)
Total Protein: 7.2 g/dL (ref 6.0–8.3)

## 2013-11-22 LAB — CBC WITH DIFFERENTIAL/PLATELET
BASOS ABS: 0 10*3/uL (ref 0.0–0.1)
Basophils Relative: 0.5 % (ref 0.0–3.0)
Eosinophils Absolute: 0.1 10*3/uL (ref 0.0–0.7)
Eosinophils Relative: 1.6 % (ref 0.0–5.0)
HEMATOCRIT: 45 % (ref 36.0–46.0)
Hemoglobin: 15.2 g/dL — ABNORMAL HIGH (ref 12.0–15.0)
Lymphocytes Relative: 32.5 % (ref 12.0–46.0)
Lymphs Abs: 1.8 10*3/uL (ref 0.7–4.0)
MCHC: 33.9 g/dL (ref 30.0–36.0)
MCV: 96.5 fl (ref 78.0–100.0)
MONO ABS: 0.4 10*3/uL (ref 0.1–1.0)
Monocytes Relative: 6.5 % (ref 3.0–12.0)
Neutro Abs: 3.3 10*3/uL (ref 1.4–7.7)
Neutrophils Relative %: 58.9 % (ref 43.0–77.0)
PLATELETS: 211 10*3/uL (ref 150.0–400.0)
RBC: 4.66 Mil/uL (ref 3.87–5.11)
RDW: 13.4 % (ref 11.5–14.6)
WBC: 5.7 10*3/uL (ref 4.5–10.5)

## 2013-11-22 LAB — LIPID PANEL
CHOL/HDL RATIO: 4
CHOLESTEROL: 194 mg/dL (ref 0–200)
HDL: 53.9 mg/dL (ref >39.00)
LDL CALC: 120 mg/dL — AB (ref 0–99)
TRIGLYCERIDES: 100 mg/dL (ref 0.0–149.0)
VLDL: 20 mg/dL (ref 0.0–40.0)

## 2013-11-22 LAB — TSH: TSH: 2.18 u[IU]/mL (ref 0.35–5.50)

## 2013-11-23 LAB — VITAMIN D 25 HYDROXY (VIT D DEFICIENCY, FRACTURES): Vit D, 25-Hydroxy: 54 ng/mL (ref 30–89)

## 2013-11-29 ENCOUNTER — Encounter: Payer: Self-pay | Admitting: Family Medicine

## 2013-11-29 ENCOUNTER — Ambulatory Visit (INDEPENDENT_AMBULATORY_CARE_PROVIDER_SITE_OTHER): Payer: Managed Care, Other (non HMO) | Admitting: Family Medicine

## 2013-11-29 VITALS — BP 102/62 | HR 66 | Temp 98.0°F | Ht 65.0 in | Wt 171.8 lb

## 2013-11-29 DIAGNOSIS — M899 Disorder of bone, unspecified: Secondary | ICD-10-CM

## 2013-11-29 DIAGNOSIS — Z23 Encounter for immunization: Secondary | ICD-10-CM

## 2013-11-29 DIAGNOSIS — M949 Disorder of cartilage, unspecified: Secondary | ICD-10-CM

## 2013-11-29 DIAGNOSIS — E7800 Pure hypercholesterolemia, unspecified: Secondary | ICD-10-CM

## 2013-11-29 DIAGNOSIS — E559 Vitamin D deficiency, unspecified: Secondary | ICD-10-CM

## 2013-11-29 DIAGNOSIS — Z2911 Encounter for prophylactic immunotherapy for respiratory syncytial virus (RSV): Secondary | ICD-10-CM

## 2013-11-29 DIAGNOSIS — Z0000 Encounter for general adult medical examination without abnormal findings: Secondary | ICD-10-CM

## 2013-11-29 DIAGNOSIS — Z1211 Encounter for screening for malignant neoplasm of colon: Secondary | ICD-10-CM | POA: Insufficient documentation

## 2013-11-29 NOTE — Patient Instructions (Signed)
Shingles vaccine  Please do stool card for colon cancer screening    Avoid red meat/ fried foods/ egg yolks/ fatty breakfast meats/ butter, cheese and high fat dairy/ and shellfish  - for cholesterol   Follow up about a month before your surgery for pre op visit / will do EKG at that time   Fat and Cholesterol Control Diet Fat and cholesterol levels in your blood and organs are influenced by your diet. High levels of fat and cholesterol may lead to diseases of the heart, small and large blood vessels, gallbladder, liver, and pancreas. CONTROLLING FAT AND CHOLESTEROL WITH DIET Although exercise and lifestyle factors are important, your diet is key. That is because certain foods are known to raise cholesterol and others to lower it. The goal is to balance foods for their effect on cholesterol and more importantly, to replace saturated and trans fat with other types of fat, such as monounsaturated fat, polyunsaturated fat, and omega-3 fatty acids. On average, a person should consume no more than 15 to 17 g of saturated fat daily. Saturated and trans fats are considered "bad" fats, and they will raise LDL cholesterol. Saturated fats are primarily found in animal products such as meats, butter, and cream. However, that does not mean you need to give up all your favorite foods. Today, there are good tasting, low-fat, low-cholesterol substitutes for most of the things you like to eat. Choose low-fat or nonfat alternatives. Choose round or loin cuts of red meat. These types of cuts are lowest in fat and cholesterol. Chicken (without the skin), fish, veal, and ground [place] breast are great choices. Eliminate fatty meats, such as hot dogs and salami. Even shellfish have little or no saturated fat. Have a 3 oz (85 g) portion when you eat lean meat, poultry, or fish. Trans fats are also called "partially hydrogenated oils." They are oils that have been scientifically manipulated so that they are solid at room  temperature resulting in a longer shelf life and improved taste and texture of foods in which they are added. Trans fats are found in stick margarine, some tub margarines, cookies, crackers, and baked goods.  When baking and cooking, oils are a great substitute for butter. The monounsaturated oils are especially beneficial since it is believed they lower LDL and raise HDL. The oils you should avoid entirely are saturated tropical oils, such as coconut and palm.  Remember to eat a lot from food groups that are naturally free of saturated and trans fat, including fish, fruit, vegetables, beans, grains (barley, rice, couscous, bulgur wheat), and pasta (without cream sauces).  IDENTIFYING FOODS THAT LOWER FAT AND CHOLESTEROL  Soluble fiber may lower your cholesterol. This type of fiber is found in fruits such as apples, vegetables such as broccoli, potatoes, and carrots, legumes such as beans, peas, and lentils, and grains such as barley. Foods fortified with plant sterols (phytosterol) may also lower cholesterol. You should eat at least 2 g per day of these foods for a cholesterol lowering effect.  Read package labels to identify low-saturated fats, trans fat free, and low-fat foods at the supermarket. Select cheeses that have only 2 to 3 g saturated fat per ounce. Use a heart-healthy tub margarine that is free of trans fats or partially hydrogenated oil. When buying baked goods (cookies, crackers), avoid partially hydrogenated oils. Breads and muffins should be made from whole grains (whole-wheat or whole oat flour, instead of "flour" or "enriched flour"). Buy non-creamy canned soups with reduced salt  and no added fats.  FOOD PREPARATION TECHNIQUES  Never deep-fry. If you must fry, either stir-fry, which uses very little fat, or use non-stick cooking sprays. When possible, broil, bake, or roast meats, and steam vegetables. Instead of putting butter or margarine on vegetables, use lemon and herbs, applesauce,  and cinnamon (for squash and sweet potatoes). Use nonfat yogurt, salsa, and low-fat dressings for salads.  LOW-SATURATED FAT / LOW-FAT FOOD SUBSTITUTES Meats / Saturated Fat (g)  Avoid: Steak, marbled (3 oz/85 g) / 11 g  Choose: Steak, lean (3 oz/85 g) / 4 g  Avoid: Hamburger (3 oz/85 g) / 7 g  Choose: Hamburger, lean (3 oz/85 g) / 5 g  Avoid: Ham (3 oz/85 g) / 6 g  Choose: Ham, lean cut (3 oz/85 g) / 2.4 g  Avoid: Chicken, with skin, dark meat (3 oz/85 g) / 4 g  Choose: Chicken, skin removed, dark meat (3 oz/85 g) / 2 g  Avoid: Chicken, with skin, light meat (3 oz/85 g) / 2.5 g  Choose: Chicken, skin removed, light meat (3 oz/85 g) / 1 g Dairy / Saturated Fat (g)  Avoid: Whole milk (1 cup) / 5 g  Choose: Low-fat milk, 2% (1 cup) / 3 g  Choose: Low-fat milk, 1% (1 cup) / 1.5 g  Choose: Skim milk (1 cup) / 0.3 g  Avoid: Hard cheese (1 oz/28 g) / 6 g  Choose: Skim milk cheese (1 oz/28 g) / 2 to 3 g  Avoid: Cottage cheese, 4% fat (1 cup) / 6.5 g  Choose: Low-fat cottage cheese, 1% fat (1 cup) / 1.5 g  Avoid: Ice cream (1 cup) / 9 g  Choose: Sherbet (1 cup) / 2.5 g  Choose: Nonfat frozen yogurt (1 cup) / 0.3 g  Choose: Frozen fruit bar / trace  Avoid: Whipped cream (1 tbs) / 3.5 g  Choose: Nondairy whipped topping (1 tbs) / 1 g Condiments / Saturated Fat (g)  Avoid: Mayonnaise (1 tbs) / 2 g  Choose: Low-fat mayonnaise (1 tbs) / 1 g  Avoid: Butter (1 tbs) / 7 g  Choose: Extra light margarine (1 tbs) / 1 g  Avoid: Coconut oil (1 tbs) / 11.8 g  Choose: Olive oil (1 tbs) / 1.8 g  Choose: Corn oil (1 tbs) / 1.7 g  Choose: Safflower oil (1 tbs) / 1.2 g  Choose: Sunflower oil (1 tbs) / 1.4 g  Choose: Soybean oil (1 tbs) / 2.4 g  Choose: Canola oil (1 tbs) / 1 g Document Released: 09/23/2005 Document Revised: 01/18/2013 Document Reviewed: 03/14/2011 Exit[employer] Patient Information 2014 [place], [place].

## 2013-11-29 NOTE — Assessment & Plan Note (Signed)
This is upcoming with her gyn physician No fragility fx Disc need for calcium/ vitamin D/ wt bearing exercise and bone density test every 2 y to monitor Disc safety/ fracture risk in detail   D level is nl

## 2013-11-29 NOTE — Progress Notes (Signed)
Pre-visit discussion using our clinic review tool. No additional management support is needed unless otherwise documented below in the visit note.  

## 2013-11-29 NOTE — Assessment & Plan Note (Signed)
Disc goals for lipids and reasons to control them Rev labs with pt Rev low sat fat diet in detail This is up= given handout on diet /low sat fat +

## 2013-11-29 NOTE — Progress Notes (Signed)
Subjective:    Patient ID: [name] [name], female    DOB: 1951-06-11, 63 y.o.   MRN: [id number]  HPI Here for health maintenance exam and to review chronic medical problems    Knee surgery is scheduled for June  Will come in for pre op in summer -does not want to do today   Wt is stable with bmi of 28  Zoster status- wants to get vaccine today- insurance will cover  Flu vaccine- did not get the vaccine - and she got the flu - she will get the vaccine this fall   Pap 7/14  Gyn- all was ok    Mammogram 7/14 nl   (had to re check a cyst)  Self exam- no lumps  Gyn does her exams   colonosc 3/06 - 10 year recall  She wants to do an IFOB in the meantime   Td 4/07   Osteopenia 4/11- has appt for her next one in March  D level is 18 - is doing well with that  No fractures   Mood has been ok -no problems   Hyperlipidemia Lab Results  Component Value Date   CHOL 194 11/22/2013   CHOL 178 11/16/2012   CHOL 181 09/17/2011   Lab Results  Component Value Date   HDL 53.90 11/22/2013   HDL 53.20 11/16/2012   HDL 52.40 09/17/2011   Lab Results  Component Value Date   LDLCALC 120* 11/22/2013   LDLCALC 108* 11/16/2012   LDLCALC 112* 09/17/2011   Lab Results  Component Value Date   TRIG 100.0 11/22/2013   TRIG 85.0 11/16/2012   TRIG 81.0 09/17/2011   Lab Results  Component Value Date   CHOLHDL 4 11/22/2013   CHOLHDL 3 11/16/2012   CHOLHDL 3 09/17/2011   Lab Results  Component Value Date   LDLDIRECT 124.4 09/12/2010   good chol is high  LDL went up a bit - and she thinks she eats too many sweets  ? Shellfish     Chemistry      Component Value Date/Time   NA 140 11/22/2013 0935   K 4.0 11/22/2013 0935   CL 105 11/22/2013 0935   CO2 28 11/22/2013 0935   BUN 16 11/22/2013 0935   CREATININE 1.0 11/22/2013 0935   CREATININE 0.83 06/21/2013 0927      Component Value Date/Time   CALCIUM 9.6 11/22/2013 0935   ALKPHOS 67 11/22/2013 0935   AST 20 11/22/2013 0935   ALT 19  11/22/2013 0935   BILITOT 0.7 11/22/2013 0935      Lab Results  Component Value Date   WBC 5.7 11/22/2013   HGB 15.2* 11/22/2013   HCT 45.0 11/22/2013   MCV 96.5 11/22/2013   PLT 211.0 11/22/2013    Lab Results  Component Value Date   TSH 2.18 11/22/2013       Review of Systems Review of Systems  Constitutional: Negative for fever, appetite change, fatigue and unexpected weight change.  Eyes: Negative for pain and visual disturbance.  Respiratory: Negative for cough and shortness of breath.   Cardiovascular: Negative for cp or palpitations    Gastrointestinal: Negative for nausea, diarrhea and constipation.  Genitourinary: Negative for urgency and frequency.  Skin: Negative for pallor or rash  pos for fungal tonail (right large) Neurological: Negative for weakness, light-headedness, numbness and headaches.  Hematological: Negative for adenopathy. Does not bruise/bleed easily.  Psychiatric/Behavioral: Negative for dysphoric mood. The patient is not nervous/anxious.  Objective:   Physical Exam  Constitutional: She appears well-developed and well-nourished. No distress.  overwt and well app  HENT:  Head: Normocephalic and atraumatic.  Right Ear: External ear normal.  Left Ear: External ear normal.  Nose: Nose normal.  Mouth/Throat: Oropharynx is clear and moist.  Eyes: Conjunctivae and EOM are normal. Pupils are equal, round, and reactive to light. Right eye exhibits no discharge. Left eye exhibits no discharge. No scleral icterus.  Neck: Normal range of motion. Neck supple. No JVD present. Carotid bruit is not present. No thyromegaly present.  Cardiovascular: Normal rate, regular rhythm, normal heart sounds and intact distal pulses.  Exam reveals no gallop.   Pulmonary/Chest: Effort normal and breath sounds normal. No respiratory distress. She has no wheezes. She has no rales.  Abdominal: Soft. Bowel sounds are normal. She exhibits no distension, no abdominal bruit and no  mass. There is no tenderness.  Musculoskeletal: She exhibits no edema and no tenderness.  Poor rom knees  Lymphadenopathy:    She has no cervical adenopathy.  Neurological: She is alert. She has normal reflexes. No cranial nerve deficit. She exhibits normal muscle tone. Coordination normal.  Skin: Skin is warm and dry. No rash noted. No erythema. No pallor.  Psychiatric: She has a normal mood and affect.          Assessment & Plan:

## 2013-11-29 NOTE — Assessment & Plan Note (Signed)
Reviewed health habits including diet and exercise and skin cancer prevention Reviewed appropriate screening tests for age  Also reviewed health mt list, fam hx and immunization status , as well as social and family history   Rev labs See HPI ifob stool card given

## 2013-11-29 NOTE — Assessment & Plan Note (Signed)
This is well controlled with current supplementation Disc imp to bone and overall health

## 2013-12-05 DIAGNOSIS — M8580 Other specified disorders of bone density and structure, unspecified site: Secondary | ICD-10-CM

## 2013-12-05 HISTORY — DX: Other specified disorders of bone density and structure, unspecified site: M85.80

## 2013-12-09 ENCOUNTER — Encounter: Payer: Self-pay | Admitting: Gynecology

## 2013-12-09 ENCOUNTER — Ambulatory Visit (INDEPENDENT_AMBULATORY_CARE_PROVIDER_SITE_OTHER): Payer: Managed Care, Other (non HMO)

## 2013-12-09 DIAGNOSIS — M949 Disorder of cartilage, unspecified: Secondary | ICD-10-CM

## 2013-12-09 DIAGNOSIS — M899 Disorder of bone, unspecified: Secondary | ICD-10-CM

## 2013-12-09 DIAGNOSIS — M8580 Other specified disorders of bone density and structure, unspecified site: Secondary | ICD-10-CM

## 2014-01-26 ENCOUNTER — Other Ambulatory Visit: Payer: Self-pay | Admitting: Family Medicine

## 2014-01-26 NOTE — Telephone Encounter (Signed)
Electronic refill request, Rx not on med list and doesn't look like it has been prescribed in over a yr. please advise

## 2014-01-26 NOTE — Telephone Encounter (Signed)
Please call her and ask her about it -thanks

## 2014-01-27 NOTE — Telephone Encounter (Signed)
I spoke with [name] [name], and she stated the she still takes atenolol, but only prn when she is having heart palpitations. She had 6 refills on med, but they have expired. Is it ok to refill?

## 2014-01-27 NOTE — Telephone Encounter (Signed)
done

## 2014-01-27 NOTE — Telephone Encounter (Signed)
Please refill times 3 , thanks 

## 2014-02-04 ENCOUNTER — Ambulatory Visit (INDEPENDENT_AMBULATORY_CARE_PROVIDER_SITE_OTHER): Payer: Managed Care, Other (non HMO) | Admitting: Internal Medicine

## 2014-02-04 ENCOUNTER — Encounter: Payer: Self-pay | Admitting: Internal Medicine

## 2014-02-04 VITALS — BP 98/58 | HR 72 | Temp 97.6°F | Wt 171.0 lb

## 2014-02-04 DIAGNOSIS — Z733 Stress, not elsewhere classified: Secondary | ICD-10-CM

## 2014-02-04 DIAGNOSIS — F439 Reaction to severe stress, unspecified: Secondary | ICD-10-CM

## 2014-02-04 DIAGNOSIS — F411 Generalized anxiety disorder: Secondary | ICD-10-CM

## 2014-02-04 DIAGNOSIS — R002 Palpitations: Secondary | ICD-10-CM

## 2014-02-04 DIAGNOSIS — K219 Gastro-esophageal reflux disease without esophagitis: Secondary | ICD-10-CM

## 2014-02-04 DIAGNOSIS — R0602 Shortness of breath: Secondary | ICD-10-CM

## 2014-02-04 DIAGNOSIS — F419 Anxiety disorder, unspecified: Secondary | ICD-10-CM

## 2014-02-04 MED ORDER — CITALOPRAM HYDROBROMIDE 10 MG PO TABS: 10.0000 mg | ORAL_TABLET | Freq: Every day | ORAL | Status: DC

## 2014-02-04 NOTE — Progress Notes (Signed)
Subjective:    Patient ID: [name] [name], female    DOB: [birth year], 63 y.o.   MRN: [id number]  HPI  Pt presents to the clinic today with c/o fatigue, flushing feeling, palpitations, chest discomfort and dyspnea on exertion. She reports this started a long time ago but had gotten worse in the last week. She has felt more stressed than usual. She does notice worsening symptoms (palpitations, shortness of breath) when she is stressed. She has a history of chronic fatigue and palpitations. She reports she is feeling more palpitations than normal. She does take atenolol for the palpitations. The shortness of breath has worsened and the chest discomfort radiates to her back and left shoulder blade. She denies any numbness/tingling in her arm or pain in her jaw. She does have a history of GERD. She is taking Nexium twice a day right now. The nexium is helping with her indigestion. She did just have labs, which were reviewed and normal.  Review of Systems      Past Medical History  Diagnosis Date  . Melanoma of skin   . Basal cell cancer 5/08  . Hyperlipidemia DIET CONTROL  . GERD (gastroesophageal reflux disease)     HH  . Eczema   . Chest pain 9/11    [facility]) and palpitations - ruled out for MI with nl. Echo  . History of abdominal pain  2011--- DUE TO IMPACTION PER XRAY  . Chronic fatigue PT DENIES  . Heart palpitations OCCASIONAL--  TAKES ATENOLOL PRN  . Normal nuclear stress test 07-09-10  . Arthritis     NECK  . Acute meniscal tear of knee LEFT  . Complication of anesthesia HARD TO WAKE  . Hiatal hernia   . Osteopenia 12/2013    T score -1.6 FRAX 16%/0.8%  . HSV-2 infection     buttocks    Current Outpatient Prescriptions  Medication Sig Dispense Refill  . acyclovir (ZOVIRAX) 200 MG capsule Take 1 capsule (200 mg total) by mouth as needed. Take 200 mg by mouth twice weekly as needed.  20 capsule  3  . acyclovir cream (ZOVIRAX) 5 % Apply daily as needed  5 g  0  . aspirin 81  MG tablet Take 81 mg by mouth daily.       Marland Kitchen atenolol (TENORMIN) 25 MG tablet TAKE 1/2 TABLET BY MOUTH DAILY  30 tablet  2  . calcium carbonate (OS-CAL) 600 MG TABS Take 600 mg by mouth daily.       . Cholecalciferol (VITAMIN D) 1000 UNITS capsule Take 1,000 Units by mouth daily.       . Cranberry 1000 MG CAPS Take 1 capsule by mouth daily. ? Strength.  Take one by mouth daily.      . diphenhydrAMINE (BENADRYL) 25 mg capsule Take 25 mg by mouth every 6 (six) hours as needed.      . hydrocortisone (ANUSOL-HC) 2.5 % rectal cream Place 1 application rectally as needed. Apply to affected area once daily as needed for hemorrhoids.  30 g  1  . Hydrocortisone Butyr Lipo Base (LOCOID LIPOCREAM) 0.1 % CREA Apply to affected areas once daily as needed.       . magnesium gluconate (MAGONATE) 500 MG tablet Take 500 mg by mouth daily.      . Multiple Vitamin (MULTIVITAMIN) tablet Take 1 tablet by mouth daily.       Marland Kitchen 5-Hydroxytryptophan (5-HTP) 100 MG CAPS Take 1 capsule by mouth daily.       Marland Kitchen  esomeprazole (NEXIUM) 40 MG capsule Take 40 mg by mouth as needed.       No current facility-administered medications for this visit.    Allergies  Allergen Reactions  . Amoxicillin Rash  . Codeine Rash  . Clarithromycin     REACTION: reaction not known    Family History  Problem Relation Age of Onset  . Hypertension Mother   . Cancer Mother     uterine  . Obesity Mother   . Thrombocytopenia Mother   . Diabetes Mother   . Alcohol abuse Father   . Heart disease Father     CAD in 91's  . Emphysema Father   . Heart disease Paternal Aunt     CAD  . Heart disease Paternal Uncle     CAD  . Colon cancer Neg Hx   . Diabetes Maternal Grandmother     type 2  . Diabetes Maternal Grandfather     type 2    History   Social History  . Marital Status: Married    Spouse Name: N/A    Number of Children: N/A  . Years of Education: N/A   Occupational History  . Not on file.   Social History Main  Topics  . Smoking status: Former Smoker    Types: Cigarettes    Quit date: 10/07/1977  . Smokeless tobacco: Never Used  . Alcohol Use: No  . Drug Use: No  . Sexual Activity: Yes    Birth Control/ Protection: Post-menopausal, Surgical     Comment: HYST   Other Topics Concern  . Not on file   Social History Narrative   Daily caffeine use.   Patient gets regular exercise.     Constitutional: Pt reports fatigue. Denies fever, malaise,  headache or abrupt weight changes.  Respiratory: Pt reports shortness of breath. Denies difficulty breathing, cough or sputum production.   Cardiovascular: Pt reports chest discomfort and palpitations. Denies chest pain, chest tightness, or swelling in the hands or feet.  Neurological: Denies dizziness, difficulty with memory, difficulty with speech or problems with balance and coordination.  Psych: Pt reports anxiety. Denies depression, SI/HI.  No other specific complaints in a complete review of systems (except as listed in HPI above).  Objective:   Physical Exam   BP 98/58  Pulse 72  Temp(Src) 97.6 F (36.4 C) (Oral)  Wt 171 lb (77.565 kg)  SpO2 98% Wt Readings from Last 3 Encounters:  02/04/14 171 lb (77.565 kg)  11/29/13 171 lb 12 oz (77.905 kg)  06/21/13 171 lb (77.565 kg)    General: Appears herstated age, well developed, well nourished in NAD. Cardiovascular: irregular rate and rhythm by ausculatation. S1,S2 noted. Slight murmer noted.  No rubs or gallops noted. No JVD or BLE edema. No carotid bruits noted. Pulmonary/Chest: Normal effort and positive vesicular breath sounds. No respiratory distress. No wheezes, rales or ronchi noted.  Neurological: Alert and oriented. Cranial nerves II-XII intact. Coordination normal. +DTRs bilaterally. Psych: mood normal, affect flat. Behavior, judgement and thought content normal.    BMET    Component Value Date/Time   NA 140 11/22/2013 0935   K 4.0 11/22/2013 0935   CL 105 11/22/2013 0935    CO2 28 11/22/2013 0935   GLUCOSE 76 11/22/2013 0935   BUN 16 11/22/2013 0935   CREATININE 1.0 11/22/2013 0935   CREATININE 0.83 06/21/2013 0927   CALCIUM 9.6 11/22/2013 0935   GFRNONAA 79.32 09/12/2010 0924   GFRAA  Value: >60  The eGFR has been calculated using the MDRD equation. This calculation has not been validated in all clinical situations. eGFR's persistently <60 mL/min signify possible Chronic Kidney Disease. 06/27/2009 1810    Lipid Panel     Component Value Date/Time   CHOL 194 11/22/2013 0935   TRIG 100.0 11/22/2013 0935   HDL 53.90 11/22/2013 0935   CHOLHDL 4 11/22/2013 0935   VLDL 20.0 11/22/2013 0935   LDLCALC 120* 11/22/2013 0935    CBC    Component Value Date/Time   WBC 5.7 11/22/2013 0935   RBC 4.66 11/22/2013 0935   HGB 15.2* 11/22/2013 0935   HCT 45.0 11/22/2013 0935   PLT 211.0 11/22/2013 0935   MCV 96.5 11/22/2013 0935   MCH 31.9 06/21/2013 0927   MCHC 33.9 11/22/2013 0935   RDW 13.4 11/22/2013 0935   LYMPHSABS 1.8 11/22/2013 0935   MONOABS 0.4 11/22/2013 0935   EOSABS 0.1 11/22/2013 0935   BASOSABS 0.0 11/22/2013 0935    Hgb A1C No results found for this basename: HGBA1C        Assessment & Plan:   Fatigue, chest discomfort, shortness of breath:  Looking back through her notes, she has had a normal cardiac workup, normal ECG and echo Repeated ECG today: normal and unchanged from prior I really think this is stress/anxiety related I would like to try celexa for 4-6 weeks to see if there is any improvement in her sympotms- she doesn't see thrilled about this. Did advise her to followup in 1 month with PCP but if symptoms persist or worsen, RTC sooner  GERD:  She is already taking Nexium BID Advised her this was ok for short term Handout given of GERD diet  RTC in 1 month or sooner if needed

## 2014-02-04 NOTE — Patient Instructions (Addendum)

## 2014-02-07 ENCOUNTER — Ambulatory Visit: Payer: Managed Care, Other (non HMO) | Admitting: Family Medicine

## 2014-03-07 ENCOUNTER — Ambulatory Visit: Payer: Managed Care, Other (non HMO) | Admitting: Family Medicine

## 2014-03-22 ENCOUNTER — Telehealth: Payer: Self-pay | Admitting: Family Medicine

## 2014-03-22 NOTE — Telephone Encounter (Signed)
Patient Information:  [name] Name: Josiane  Phone: [phone]  Patient: [name] [name]  Gender: Female  DOB: 03/28/1961  Age: 63 Years  PCP: [place], [facility]or, [facility])  Pregnant: No  Office Follow Up:  Does the office need to follow up with this patient?: No  Instructions For The Office: N/A  RN Note:  Advised Patient that this does not sound like Pink Eye, but that she probably got a piece of sand or something else in her eye and it may have scratched her cornea. Advised her to go to a local ER or Urgent Care so she can have it looked at and taken care of properly. Patient Agreed.  Symptoms  Reason For Call & Symptoms: Patient is calling from [facility] because she thinks she ha [employer] and wants to see if some medication can be called in to a pharmacy down there for her. She states that yesterday she started having clear discharge (real watery) in her left eye, along with some sharp pain like there is something in it. She washed it out with eye drops and today the pain seems better, but it is still scratchy. it is not as watery as it was.  Reviewed Health History In EMR: Yes  Reviewed Medications In EMR: Yes  Reviewed Allergies In EMR: Yes  Reviewed Surgeries / Procedures: Yes  Date of Onset of Symptoms: 03/21/2014 OB / GYN:  LMP: Unknown  Guideline(s) Used:  Eye - Red Without Pus  Disposition Per Guideline:   Go to Office Now  Reason For Disposition Reached:   Foreign body sensation ("feels like something is in there")  Advice Given:  N/A  Patient Will Follow Care Advice:  YES

## 2014-04-04 ENCOUNTER — Telehealth: Payer: Self-pay | Admitting: Family Medicine

## 2014-04-04 NOTE — Telephone Encounter (Signed)
Patient Information:  [name] Name: Chong Sicilian  Phone: [phone]  Patient: [name] [name]  Gender: Female  DOB: [birth year]  Age: 63 Years  PCP: [name] [facility])  Office Follow Up:  Does the office need to follow up with this patient?: No  Instructions For The Office: N/A  RN Note:  Dry Cough, onset 03-31-14.  Pt denies wheezing, chest pain or SOB.  All emergent sxs ruled out per Cough protocol, home care given per protocol.  Advised Pt to call back if sxs worsen after home care.  Symptoms  Reason For Call & Symptoms: Dry Cough, onset 03-31-14  Reviewed Health History In EMR: Yes  Reviewed Medications In EMR: Yes  Reviewed Allergies In EMR: Yes  Reviewed Surgeries / Procedures: Yes  Date of Onset of Symptoms: 03/31/2014  Treatments Tried: Robitussin  Treatments Tried Worked: No  Guideline(s) Used:  Cough  Disposition Per Guideline:   Home Care  Reason For Disposition Reached:   Cough with no complications  Advice Given:  Reassurance  Coughing is the way that our lungs remove irritants and mucus. It helps protect our lungs from getting pneumonia.  You can get a dry hacking cough after a chest cold. Sometimes this type of cough can last 1-3 weeks, and be worse at night.  Cough Medicines:  Home Remedy - Honey: This old home remedy has been shown to help decrease coughing at night. The adult dosage is 2 teaspoons (10 ml) at bedtime. Honey should not be given to infants under one year of age.  OTC Cough Syrup - Dextromethorphan:  Examples: Benylin, Robitussin DM, Vicks 44 Cough Relief  Coughing Spasms:  Drink warm fluids. Inhale warm mist (Reason: both relax the airway and loosen up the phlegm).  Suck on cough drops or hard candy to coat the irritated throat.  Prevent Dehydration:  Drink adequate liquids.  This will help soothe an irritated or dry throat and loosen up the phlegm.  Expected Course:   The expected course depends on what is causing the cough.  Viral bronchitis (chest cold) causes a cough that lasts 1 to 3 weeks. Sometimes you may cough up lots of phlegm (sputum, mucus). The mucus can normally be white, gray, yellow, or green.  Call Back If:  Difficulty breathing  Cough lasts more than 3 weeks  Fever lasts > 3 days  You become worse.  Patient Will Follow Care Advice:  YES

## 2014-05-09 ENCOUNTER — Other Ambulatory Visit: Payer: Self-pay

## 2014-05-09 DIAGNOSIS — Z1231 Encounter for screening mammogram for malignant neoplasm of breast: Secondary | ICD-10-CM

## 2014-05-18 ENCOUNTER — Ambulatory Visit: Payer: Managed Care, Other (non HMO)

## 2014-05-25 ENCOUNTER — Ambulatory Visit: Payer: Managed Care, Other (non HMO)

## 2014-06-01 ENCOUNTER — Encounter (INDEPENDENT_AMBULATORY_CARE_PROVIDER_SITE_OTHER): Payer: Self-pay

## 2014-06-01 ENCOUNTER — Ambulatory Visit
Admission: RE | Admit: 2014-06-01 | Discharge: 2014-06-01 | Disposition: A | Discharge status: Home / Self Care | Payer: Managed Care, Other (non HMO) | Source: Ambulatory Visit

## 2014-06-01 DIAGNOSIS — Z1231 Encounter for screening mammogram for malignant neoplasm of breast: Secondary | ICD-10-CM

## 2014-06-02 ENCOUNTER — Other Ambulatory Visit: Payer: Self-pay | Admitting: Gynecology

## 2014-06-02 DIAGNOSIS — R928 Other abnormal and inconclusive findings on diagnostic imaging of breast: Secondary | ICD-10-CM

## 2014-06-06 ENCOUNTER — Telehealth: Payer: Self-pay | Admitting: *Deleted

## 2014-06-06 NOTE — Telephone Encounter (Signed)
Pt was called back to have additional imaging with breast center for left breast cyst. Pt asked if Dr.Fontaine thinks she soul proceed with imaging I explained that Dr.Fontaine will say the same as breast center recommendations.

## 2014-06-07 ENCOUNTER — Other Ambulatory Visit: Payer: Self-pay | Admitting: Gynecology

## 2014-06-07 ENCOUNTER — Ambulatory Visit
Admission: RE | Admit: 2014-06-07 | Discharge: 2014-06-07 | Disposition: A | Discharge status: Home / Self Care | Payer: Managed Care, Other (non HMO) | Source: Ambulatory Visit | Attending: Gynecology | Admitting: Gynecology

## 2014-06-07 DIAGNOSIS — R928 Other abnormal and inconclusive findings on diagnostic imaging of breast: Secondary | ICD-10-CM

## 2014-06-09 ENCOUNTER — Other Ambulatory Visit: Payer: Managed Care, Other (non HMO)

## 2014-06-24 ENCOUNTER — Ambulatory Visit (INDEPENDENT_AMBULATORY_CARE_PROVIDER_SITE_OTHER): Payer: Managed Care, Other (non HMO) | Admitting: Gynecology

## 2014-06-24 ENCOUNTER — Encounter: Payer: Self-pay | Admitting: Gynecology

## 2014-06-24 ENCOUNTER — Other Ambulatory Visit (HOSPITAL_COMMUNITY)
Admission: RE | Admit: 2014-06-24 | Discharge: 2014-06-24 | Disposition: A | Discharge status: Home / Self Care | Payer: Managed Care, Other (non HMO) | Source: Ambulatory Visit | Attending: Gynecology | Admitting: Gynecology

## 2014-06-24 VITALS — BP 120/76 | Ht 65.0 in | Wt 175.0 lb

## 2014-06-24 DIAGNOSIS — M8580 Other specified disorders of bone density and structure, unspecified site: Secondary | ICD-10-CM

## 2014-06-24 DIAGNOSIS — Z01419 Encounter for gynecological examination (general) (routine) without abnormal findings: Secondary | ICD-10-CM | POA: Insufficient documentation

## 2014-06-24 DIAGNOSIS — M899 Disorder of bone, unspecified: Secondary | ICD-10-CM

## 2014-06-24 DIAGNOSIS — M949 Disorder of cartilage, unspecified: Secondary | ICD-10-CM

## 2014-06-24 DIAGNOSIS — N952 Postmenopausal atrophic vaginitis: Secondary | ICD-10-CM

## 2014-06-24 NOTE — Progress Notes (Signed)
MARELY APGAR 10/02/1951 [id number]        63 y.o.  G3P3 for annual exam.  Several issues noted below.  Past medical history,surgical history, problem list, medications, allergies, family history and social history were all reviewed and documented as reviewed in the EPIC chart.  ROS:  12 system ROS performed with pertinent positives and negatives included in the history, assessment and plan.   Additional significant findings :  none   Exam: Kim Counsellor Vitals:   06/24/14 1140  BP: 120/76  Height: 5\' 5"  (1.651 m)  Weight: 175 lb (79.379 kg)   General appearance:  Normal affect, orientation and appearance. Skin: Grossly normal HEENT: Without gross lesions.  No cervical or supraclavicular adenopathy. Thyroid normal.  Lungs:  Clear without wheezing, rales or rhonchi Cardiac: RR, without RMG Abdominal:  Soft, nontender, without masses, guarding, rebound, organomegaly or hernia Breasts:  Examined lying and sitting without masses, retractions, discharge or axillary adenopathy. Pelvic:  Ext/BUS/vagina with generalized atrophic changes. Pap of cuff done  Adnexa  Without masses or tenderness    Anus and perineum  Normal   Rectovaginal  Normal sphincter tone without palpated masses or tenderness.    Assessment/Plan:  63 y.o. G3P3 female for annual exam .   1. History of TVH for complex hyperplasia and maternal history of uterine cancer. Doing well without significant hot flushes, night sweats, vaginal dryness or dyspareunia. We'll continue to monitor. 2. Pap smear 2012. Pap of vaginal cuff done today.  Review current screening guidelines. Options stop screening altogether as she is status post hysterectomy reviewed. Will readdress on annual basis. 3. Mammography 05/2014. Had breast cyst aspirated in her left breast. They recommended a 6 month follow up ultrasound and I encouraged her to follow up for this. SBE monthly reviewed. Her exam today is normal with no palpable  abnormalities. 4. Osteopenia.  DEXA 12/2013 T score -1.6 FRAX 16%/0.8%. Recent vitamin D level 54. Continue with extra calcium vitamin D. Plan repeat DEXA two-year interval. 5. Colonoscopy 2006. Reminded patient that she is coming doing she is going to call [facility] to schedule. 6. Health maintenance. No routine blood work done and she reports this done at her primary physician's office. Follow up one year, sooner as needed.     Anastasio Auerbach MD, 12:51 PM 06/24/2014

## 2014-06-24 NOTE — Patient Instructions (Signed)
Follow up for the repeat breast studies in 5 months.  You may obtain a copy of any labs that were done today by logging onto MyChart as outlined in the instructions provided with your AVS (after visit summary). The office will not call with normal lab results but certainly if there are any significant abnormalities then we will contact you.   Health Maintenance, Female A healthy lifestyle and preventative care can promote health and wellness.  Maintain regular health, dental, and eye exams.  Eat a healthy diet. Foods like vegetables, fruits, whole grains, low-fat dairy products, and lean protein foods contain the nutrients you need without too many calories. Decrease your intake of foods high in solid fats, added sugars, and salt. Get information about a proper diet from your caregiver, if necessary.  Regular physical exercise is one of the most important things you can do for your health. Most adults should get at least 150 minutes of moderate-intensity exercise (any activity that increases your heart rate and causes you to sweat) each week. In addition, most adults need muscle-strengthening exercises on 2 or more days a week.   Maintain a healthy weight. The body mass index (BMI) is a screening tool to identify possible weight problems. It provides an estimate of body fat based on height and weight. Your caregiver can help determine your BMI, and can help you achieve or maintain a healthy weight. For adults 20 years and older:  A BMI below 18.5 is considered underweight.  A BMI of 18.5 to 24.9 is normal.  A BMI of 25 to 29.9 is considered overweight.  A BMI of 30 and above is considered obese.  Maintain normal blood lipids and cholesterol by exercising and minimizing your intake of saturated fat. Eat a balanced diet with plenty of fruits and vegetables. Blood tests for lipids and cholesterol should begin at age 68 and be repeated every 5 years. If your lipid or cholesterol levels are high,  you are over 50, or you are a high risk for heart disease, you may need your cholesterol levels checked more frequently.Ongoing high lipid and cholesterol levels should be treated with medicines if diet and exercise are not effective.  If you smoke, find out from your caregiver how to quit. If you do not use tobacco, do not start.  Lung cancer screening is recommended for adults aged 37 80 years who are at high risk for developing lung cancer because of a history of smoking. Yearly low-dose computed tomography (CT) is recommended for people who have at least a 30-pack-year history of smoking and are a current smoker or have quit within the past 15 years. A pack year of smoking is smoking an average of 1 pack of cigarettes a day for 1 year (for example: 1 pack a day for 30 years or 2 packs a day for 15 years). Yearly screening should continue until the smoker has stopped smoking for at least 15 years. Yearly screening should also be stopped for people who develop a health problem that would prevent them from having lung cancer treatment.  If you are pregnant, do not drink alcohol. If you are breastfeeding, be very cautious about drinking alcohol. If you are not pregnant and choose to drink alcohol, do not exceed 1 drink per day. One drink is considered to be 12 ounces (355 mL) of beer, 5 ounces (148 mL) of wine, or 1.5 ounces (44 mL) of liquor.  Avoid use of street drugs. Do not share needles with anyone.  for help if you need support or instructions about stopping the use of drugs.  High blood pressure causes heart disease and increases the risk of stroke. Blood pressure should be checked at least every 1 to 2 years. Ongoing high blood pressure should be treated with medicines, if weight loss and exercise are not effective.  If you are 55 to 62 years old, ask your caregiver if you should take aspirin to prevent strokes.  Diabetes screening involves taking a blood sample to check your fasting blood sugar  level. This should be done once every 3 years, after age 45, if you are within normal weight and without risk factors for diabetes. Testing should be considered at a younger age or be carried out more frequently if you are overweight and have at least 1 risk factor for diabetes.  Breast cancer screening is essential preventative care for women. You should practice "breast self-awareness." This means understanding the normal appearance and feel of your breasts and may include breast self-examination. Any changes detected, no matter how small, should be reported to a caregiver. Women in their 20s and 30s should have a clinical breast exam (CBE) by a caregiver as part of a regular health exam every 1 to 3 years. After age 40, women should have a CBE every year. Starting at age 40, women should consider having a mammogram (breast X-ray) every year. Women who have a family history of breast cancer should talk to their caregiver about genetic screening. Women at a high risk of breast cancer should talk to their caregiver about having an MRI and a mammogram every year.  Breast cancer gene (BRCA)-related cancer risk assessment is recommended for women who have family members with BRCA-related cancers. BRCA-related cancers include breast, ovarian, tubal, and peritoneal cancers. Having family members with these cancers may be associated with an increased risk for harmful changes (mutations) in the breast cancer genes BRCA1 and BRCA2. Results of the assessment will determine the need for genetic counseling and BRCA1 and BRCA2 testing.  The Pap test is a screening test for cervical cancer. Women should have a Pap test starting at age 21. Between ages 21 and 29, Pap tests should be repeated every 2 years. Beginning at age 30, you should have a Pap test every 3 years as long as the past 3 Pap tests have been normal. If you had a hysterectomy for a problem that was not cancer or a condition that could lead to cancer, then  you no longer need Pap tests. If you are between ages 65 and 70, and you have had normal Pap tests going back 10 years, you no longer need Pap tests. If you have had past treatment for cervical cancer or a condition that could lead to cancer, you need Pap tests and screening for cancer for at least 20 years after your treatment. If Pap tests have been discontinued, risk factors (such as a new sexual partner) need to be reassessed to determine if screening should be resumed. Some women have medical problems that increase the chance of getting cervical cancer. In these cases, your caregiver may recommend more frequent screening and Pap tests.  The human papillomavirus (HPV) test is an additional test that may be used for cervical cancer screening. The HPV test looks for the virus that can cause the cell changes on the cervix. The cells collected during the Pap test can be tested for HPV. The HPV test could be used to screen women aged 30 years and   and older, and should be used in women of any age who have unclear Pap test results. After the age of 49, women should have HPV testing at the same frequency as a Pap test.  Colorectal cancer can be detected and often prevented. Most routine colorectal cancer screening begins at the age of 59 and continues through age 59. However, your caregiver may recommend screening at an earlier age if you have risk factors for colon cancer. On a yearly basis, your caregiver may provide home test kits to check for hidden blood in the stool. Use of a small camera at the end of a tube, to directly examine the colon (sigmoidoscopy or colonoscopy), can detect the earliest forms of colorectal cancer. Talk to your caregiver about this at age 20, when routine screening begins. Direct examination of the colon should be repeated every 5 to 10 years through age 32, unless early forms of pre-cancerous polyps or small growths are found.  Hepatitis C blood testing is recommended for all people  born from 29 through 1965 and any individual with known risks for hepatitis C.  Practice safe sex. Use condoms and avoid high-risk sexual practices to reduce the spread of sexually transmitted infections (STIs). Sexually active women aged 28 and younger should be checked for Chlamydia, which is a common sexually transmitted infection. Older women with new or multiple partners should also be tested for Chlamydia. Testing for other STIs is recommended if you are sexually active and at increased risk.  Osteoporosis is a disease in which the bones lose minerals and strength with aging. This can result in serious bone fractures. The risk of osteoporosis can be identified using a bone density scan. Women ages 59 and over and women at risk for fractures or osteoporosis should discuss screening with their caregivers. Ask your caregiver whether you should be taking a calcium supplement or vitamin D to reduce the rate of osteoporosis.  Menopause can be associated with physical symptoms and risks. Hormone replacement therapy is available to decrease symptoms and risks. You should talk to your caregiver about whether hormone replacement therapy is right for you.  Use sunscreen. Apply sunscreen liberally and repeatedly throughout the day. You should seek shade when your shadow is shorter than you. Protect yourself by wearing long sleeves, pants, a wide-brimmed hat, and sunglasses year round, whenever you are outdoors.  Notify your caregiver of new moles or changes in moles, especially if there is a change in shape or color. Also notify your caregiver if a mole is larger than the size of a pencil eraser.  Stay current with your immunizations. Document Released: 04/08/2011 Document Revised: 01/18/2013 Document Reviewed: 04/08/2011 [facility] Patient Information 2014 [place].

## 2014-06-25 LAB — URINALYSIS W MICROSCOPIC + REFLEX CULTURE
BACTERIA UA: NONE SEEN
Bilirubin Urine: NEGATIVE
Casts: NONE SEEN
Crystals: NONE SEEN
Glucose, UA: NEGATIVE mg/dL
HGB URINE DIPSTICK: NEGATIVE
KETONES UR: NEGATIVE mg/dL
LEUKOCYTES UA: NEGATIVE
NITRITE: NEGATIVE
PH: 7 (ref 5.0–8.0)
PROTEIN: NEGATIVE mg/dL
Specific Gravity, Urine: 1.013 (ref 1.005–1.030)
Squamous Epithelial / LPF: NONE SEEN
UROBILINOGEN UA: 0.2 mg/dL (ref 0.0–1.0)

## 2014-06-28 LAB — CYTOLOGY - PAP

## 2014-07-22 ENCOUNTER — Ambulatory Visit (INDEPENDENT_AMBULATORY_CARE_PROVIDER_SITE_OTHER): Payer: Managed Care, Other (non HMO)

## 2014-07-22 DIAGNOSIS — Z23 Encounter for immunization: Secondary | ICD-10-CM

## 2014-07-25 ENCOUNTER — Ambulatory Visit (INDEPENDENT_AMBULATORY_CARE_PROVIDER_SITE_OTHER): Payer: Managed Care, Other (non HMO) | Admitting: Family Medicine

## 2014-07-25 ENCOUNTER — Encounter: Payer: Self-pay | Admitting: Family Medicine

## 2014-07-25 VITALS — BP 110/78 | HR 59 | Temp 97.5°F | Ht 65.0 in | Wt 176.2 lb

## 2014-07-25 DIAGNOSIS — R300 Dysuria: Secondary | ICD-10-CM

## 2014-07-25 DIAGNOSIS — N3000 Acute cystitis without hematuria: Secondary | ICD-10-CM

## 2014-07-25 DIAGNOSIS — N390 Urinary tract infection, site not specified: Secondary | ICD-10-CM | POA: Insufficient documentation

## 2014-07-25 LAB — POCT URINALYSIS DIPSTICK
BILIRUBIN UA: NEGATIVE
GLUCOSE UA: NEGATIVE
Ketones, UA: NEGATIVE
NITRITE UA: NEGATIVE
Protein, UA: NEGATIVE
Spec Grav, UA: 1.02
Urobilinogen, UA: 0.2
pH, UA: 6

## 2014-07-25 MED ORDER — CIPROFLOXACIN HCL 250 MG PO TABS: 250.0000 mg | ORAL_TABLET | Freq: Two times a day (BID) | ORAL | Status: DC

## 2014-07-25 NOTE — Patient Instructions (Signed)
Drink lots of water  Take the cipro as directed Update if not improving in several days  We will call you with a culture result

## 2014-07-25 NOTE — Progress Notes (Signed)
Subjective:    Patient ID: [name] [name], female    DOB: Mar 09, 1951, 63 y.o.   MRN: [id number]  HPI Here with urinary symptoms   Started middle of last week so she drank lots of water Burning to urinate Frequent urination  Some cloudiness to urine  No odor  No blood seen  No fever or n/v  No flank pain   Results for orders placed in visit on 07/25/14  POCT URINALYSIS DIPSTICK      Result Value Ref Range   Color, UA yellow     Clarity, UA cloudy     Glucose, UA neg.     Bilirubin, UA neg.     Ketones, UA neg.     Spec Grav, UA 1.020     Blood, UA Moderate     pH, UA 6.0     Protein, UA neg.     Urobilinogen, UA 0.2     Nitrite, UA neg.     Leukocytes, UA small (1+)     Patient Active Problem List   Diagnosis Date Noted  . Colon cancer screening 11/29/2013  . Routine general medical examination at a health care facility 09/16/2011  . DERMATITIS, ATOPIC 11/27/2010  . URTICARIA DUE TO COLD OR HEAT 11/27/2010  . STRESS REACTION, ACUTE, WITH EMOTIONAL DISTURBANCE 09/14/2010  . THROAT PAIN, CHRONIC 09/14/2010  . EXTERNAL HEMORRHOIDS WITHOUT MENTION COMP 06/25/2010  . ABNORMAL EKG 12/26/2009  . OTHER CHRONIC SINUSITIS 10/19/2009  . UNSPECIFIED VITAMIN D DEFICIENCY 04/21/2009  . ALLERGIC RHINITIS 11/09/2008  . GERD 07/28/2008  . OSTEOPENIA 03/14/2008  . HYPERCHOLESTEROLEMIA, PURE 03/13/2007  . HIATAL HERNIA 01/12/2007   Past Medical History  Diagnosis Date  . Melanoma of skin   . Basal cell cancer 5/08  . Hyperlipidemia DIET CONTROL  . GERD (gastroesophageal reflux disease)     HH  . Eczema   . Chest pain 9/11    [facility]) and palpitations - ruled out for MI with nl. Echo  . History of abdominal pain  2011--- DUE TO IMPACTION PER XRAY  . Chronic fatigue PT DENIES  . Heart palpitations OCCASIONAL--  TAKES ATENOLOL PRN  . Normal nuclear stress test 07-09-10  . Arthritis     NECK  . Acute meniscal tear of knee LEFT  . Complication of anesthesia HARD TO WAKE  .  Hiatal hernia   . Osteopenia 12/2013    T score -1.6 FRAX 16%/0.8%  . HSV-2 infection     buttocks  . Breast cyst    Past Surgical History  Procedure Laterality Date  . Mri neck  3/00    C4-C5 herniation small stenosis mild C4-5, C5-6  . Melanoma excision      TRUNK  . Carotid US  9/05    Mild, recheck 1 year  . Carotid US  10/06    Normal  . Esophagogastroduodenoscopy  9/07    Normal  . Abnormal mole removed from leg - ? basal cell    . Hysteroscopy with resectoscope  2000    POLPECTOMY'S  . Tubal ligation  23 YRS AGO  . Tonsillectomy    . Transthoracic echocardiogram  06-29-2010    NORMAL LVSF, EF 55-60%,  MILD MR  . Knee arthroscopy  10/23/2011    Procedure: ARTHROSCOPY KNEE;  Surgeon: Gearlean Alf, MD;  Location: [facility];  Service: Orthopedics;  Laterality: Left;  LEFT KNEE SCOPE WITH DEBRIDEMENT   . Chondroplasty  10/23/2011    Procedure: CHONDROPLASTY;  Surgeon: Gearlean Alf, MD;  Location[facility]  Service: Orthopedics;  Laterality: Left;  Left medial   . Vaginal hysterectomy  2001    complex hyperplasia with mother's history of uterine cancer   History  Substance Use Topics  . Smoking status: Former Smoker    Types: Cigarettes    Quit date: 10/07/1977  . Smokeless tobacco: Never Used  . Alcohol Use: No   Family History  Problem Relation Age of Onset  . Hypertension Mother   . Cancer Mother     uterine  . Obesity Mother   . Thrombocytopenia Mother   . Diabetes Mother   . Alcohol abuse Father   . Heart disease Father     CAD in 70's  . Emphysema Father   . Heart disease Paternal Aunt     CAD  . Heart disease Paternal Uncle     CAD  . Colon cancer Neg Hx   . Diabetes Maternal Grandmother     type 2  . Diabetes Maternal Grandfather     type 2   Allergies  Allergen Reactions  . Amoxicillin Rash  . Codeine Rash  . Clarithromycin     REACTION: reaction not known   Current Outpatient Prescriptions on  File Prior to Visit  Medication Sig Dispense Refill  . 5-Hydroxytryptophan (5-HTP) 100 MG CAPS Take 1 capsule by mouth daily.       Marland Kitchen acyclovir (ZOVIRAX) 200 MG capsule Take 1 capsule (200 mg total) by mouth as needed. Take 200 mg by mouth twice weekly as needed.  20 capsule  3  . acyclovir cream (ZOVIRAX) 5 % Apply daily as needed  5 g  0  . aspirin 81 MG tablet Take 81 mg by mouth daily.       Marland Kitchen atenolol (TENORMIN) 25 MG tablet TAKE 1/2 TABLET BY MOUTH DAILY  30 tablet  2  . calcium carbonate (OS-CAL) 600 MG TABS Take 600 mg by mouth daily.       . Cholecalciferol (VITAMIN D) 1000 UNITS capsule Take 1,000 Units by mouth daily.       . Cranberry 1000 MG CAPS Take 1 capsule by mouth daily. ? Strength.  Take one by mouth daily.      . diphenhydrAMINE (BENADRYL) 25 mg capsule Take 25 mg by mouth every 6 (six) hours as needed.      . hydrocortisone (ANUSOL-HC) 2.5 % rectal cream Place 1 application rectally as needed. Apply to affected area once daily as needed for hemorrhoids.  30 g  1  . Hydrocortisone Butyr Lipo Base (LOCOID LIPOCREAM) 0.1 % CREA Apply to affected areas once daily as needed.       . magnesium gluconate (MAGONATE) 500 MG tablet Take 500 mg by mouth daily.      . Multiple Vitamin (MULTIVITAMIN) tablet Take 1 tablet by mouth daily.       Marland Kitchen esomeprazole (NEXIUM) 40 MG capsule Take 40 mg by mouth as needed.       No current facility-administered medications on file prior to visit.      Review of Systems Review of Systems  Constitutional: Negative for fever, appetite change, fatigue and unexpected weight change.  Eyes: Negative for pain and visual disturbance.  Respiratory: Negative for cough and shortness of breath.   Cardiovascular: Negative for cp or palpitations    Gastrointestinal: Negative for nausea, diarrhea and constipation.  Genitourinary: pos for urgency and frequency. pos for dysuria and neg for hematuria  Skin: Negative for pallor or rash   Neurological: Negative  for weakness, light-headedness, numbness and headaches.  Hematological: Negative for adenopathy. Does not bruise/bleed easily.  Psychiatric/Behavioral: Negative for dysphoric mood. The patient is not nervous/anxious.         Objective:   Physical Exam  Constitutional: She appears well-developed and well-nourished. No distress.  HENT:  Head: Normocephalic and atraumatic.  Eyes: Conjunctivae and EOM are normal. Pupils are equal, round, and reactive to light.  Neck: Normal range of motion. Neck supple.  Cardiovascular: Normal rate, regular rhythm and normal heart sounds.   Pulmonary/Chest: Effort normal and breath sounds normal.  Abdominal: Soft. Bowel sounds are normal. She exhibits no distension and no mass. There is tenderness. There is no rebound and no guarding.  Mild suprapubic tenderness  No cva tenderness   Lymphadenopathy:    She has no cervical adenopathy.  Neurological: She is alert. She has normal reflexes.  Skin: Skin is warm and dry. No rash noted. No pallor.  Psychiatric: She has a normal mood and affect.          Assessment & Plan:   Problem List Items Addressed This Visit     Genitourinary   UTI (urinary tract infection) - Primary     Uncomplicated  tx with cipro 250 bid for 5d Enc water intake Update if worse or not imp. cx pending     Relevant Orders      Urine culture    Other Visit Diagnoses   Dysuria        Relevant Orders       POCT urinalysis dipstick (Completed)

## 2014-07-25 NOTE — Assessment & Plan Note (Signed)
Uncomplicated  tx with cipro 250 bid for 5d Enc water intake Update if worse or not imp. cx pending

## 2014-07-25 NOTE — Progress Notes (Signed)
Pre visit review using our clinic review tool, if applicable. No additional management support is needed unless otherwise documented below in the visit note. 

## 2014-07-28 LAB — URINE CULTURE

## 2014-07-29 ENCOUNTER — Telehealth: Payer: Self-pay | Admitting: Family Medicine

## 2014-07-29 NOTE — Telephone Encounter (Signed)
Addressed through lab results  

## 2014-07-29 NOTE — Telephone Encounter (Signed)
Pt returned your call about lab results  [phone]

## 2014-08-08 ENCOUNTER — Encounter: Payer: Self-pay | Admitting: Family Medicine

## 2014-08-11 ENCOUNTER — Ambulatory Visit: Payer: Self-pay | Admitting: Orthopedic Surgery

## 2014-10-21 ENCOUNTER — Ambulatory Visit (INDEPENDENT_AMBULATORY_CARE_PROVIDER_SITE_OTHER): Payer: Managed Care, Other (non HMO) | Admitting: Family Medicine

## 2014-10-21 ENCOUNTER — Encounter: Payer: Self-pay | Admitting: Family Medicine

## 2014-10-21 VITALS — BP 118/84 | HR 68 | Temp 97.8°F | Ht 65.0 in | Wt 175.1 lb

## 2014-10-21 DIAGNOSIS — Z01818 Encounter for other preprocedural examination: Secondary | ICD-10-CM | POA: Insufficient documentation

## 2014-10-21 MED ORDER — ACYCLOVIR 200 MG PO CAPS: 200.0000 mg | ORAL_CAPSULE | ORAL | Status: DC | PRN

## 2014-10-21 NOTE — Progress Notes (Signed)
Subjective:    Patient ID: [name] [name], female    DOB: 1951-08-16, 64 y.o.   MRN: [id number]  HPI Here for pre op eval  Knee replacement upcoming in Feb  Dr Maureen Ralphs - L knee  Arthritis has reached its max  Is not walking with a cane suprisingly and she still cleans houses   Med sensitivities Amoxicillin- rash Codeine - rash  Clarithromycin - ? Reaction   No hx of anaphylaxis No all to bee stings , or foods   EKG stabl with NSR rate of 63 and low voltage (her baseline)  Wt is up 1 lb   occ headache- today one side of head   Hx of anethesia - no problems / no nausea or vomiting  Will have general aneth with this   bp is good  BP Readings from Last 3 Encounters:  10/21/14 118/84  07/25/14 110/78  06/24/14 120/76    She will stop 81 asa 5 days prior  NIKE - she knows she has to stop it before surgery also  Tylenol does not help   No heart or lung problems  Has had palpitations in the past - she stays away from caffeine   (has atenolol if necessary-has not needed in a long time)  Quit smoking in 1979- she smoked since teenage years   Patient Active Problem List   Diagnosis Date Noted  . Pre-operative examination 10/21/2014  . UTI (urinary tract infection) 07/25/2014  . Colon cancer screening 11/29/2013  . Routine general medical examination at a health care facility 09/16/2011  . DERMATITIS, ATOPIC 11/27/2010  . URTICARIA DUE TO COLD OR HEAT 11/27/2010  . STRESS REACTION, ACUTE, WITH EMOTIONAL DISTURBANCE 09/14/2010  . THROAT PAIN, CHRONIC 09/14/2010  . EXTERNAL HEMORRHOIDS WITHOUT MENTION COMP 06/25/2010  . ABNORMAL EKG 12/26/2009  . OTHER CHRONIC SINUSITIS 10/19/2009  . UNSPECIFIED VITAMIN D DEFICIENCY 04/21/2009  . ALLERGIC RHINITIS 11/09/2008  . GERD 07/28/2008  . OSTEOPENIA 03/14/2008  . HYPERCHOLESTEROLEMIA, PURE 03/13/2007  . HIATAL HERNIA 01/12/2007   Past Medical History  Diagnosis Date  . Melanoma of skin   . Basal cell cancer  5/08  . Hyperlipidemia DIET CONTROL  . GERD (gastroesophageal reflux disease)     HH  . Eczema   . Chest pain 9/11    [facility]) and palpitations - ruled out for MI with nl. Echo  . History of abdominal pain  2011--- DUE TO IMPACTION PER XRAY  . Chronic fatigue PT DENIES  . Heart palpitations OCCASIONAL--  TAKES ATENOLOL PRN  . Normal nuclear stress test 07-09-10  . Arthritis     NECK  . Acute meniscal tear of knee LEFT  . Complication of anesthesia HARD TO WAKE  . Hiatal hernia   . Osteopenia 12/2013    T score -1.6 FRAX 16%/0.8%  . HSV-2 infection     buttocks  . Breast cyst    Past Surgical History  Procedure Laterality Date  . Mri neck  3/00    C4-C5 herniation small stenosis mild C4-5, C5-6  . Melanoma excision      TRUNK  . Carotid US  9/05    Mild, recheck 1 year  . Carotid US  10/06    Normal  . Esophagogastroduodenoscopy  9/07    Normal  . Abnormal mole removed from leg - ? basal cell    . Hysteroscopy with resectoscope  2000    POLPECTOMY'S  . Tubal ligation  23 YRS AGO  .  Tonsillectomy    . Transthoracic echocardiogram  06-29-2010    NORMAL LVSF, EF 55-60%,  MILD MR  . Knee arthroscopy  10/23/2011    Procedure: ARTHROSCOPY KNEE;  Surgeon: Gearlean Alf, MD;  Location: [facility];  Service: Orthopedics;  Laterality: Left;  LEFT KNEE SCOPE WITH DEBRIDEMENT   . Chondroplasty  10/23/2011    Procedure: CHONDROPLASTY;  Surgeon: Gearlean Alf, MD;  Location: [facility];  Service: Orthopedics;  Laterality: Left;  Left medial   . Vaginal hysterectomy  2001    complex hyperplasia with mother's history of uterine cancer   History  Substance Use Topics  . Smoking status: Former Smoker    Types: Cigarettes    Quit date: 10/07/1977  . Smokeless tobacco: Never Used  . Alcohol Use: No   Family History  Problem Relation Age of Onset  . Hypertension Mother   . Cancer Mother     uterine  . Obesity Mother   . Thrombocytopenia Mother    . Diabetes Mother   . Alcohol abuse Father   . Heart disease Father     CAD in 83's  . Emphysema Father   . Heart disease Paternal Aunt     CAD  . Heart disease Paternal Uncle     CAD  . Colon cancer Neg Hx   . Diabetes Maternal Grandmother     type 2  . Diabetes Maternal Grandfather     type 2   Allergies  Allergen Reactions  . Amoxicillin Rash  . Codeine Rash  . Clarithromycin     REACTION: reaction not known   Current Outpatient Prescriptions on File Prior to Visit  Medication Sig Dispense Refill  . 5-Hydroxytryptophan (5-HTP) 100 MG CAPS Take 1 capsule by mouth daily.     Marland Kitchen acyclovir (ZOVIRAX) 200 MG capsule Take 1 capsule (200 mg total) by mouth as needed. Take 200 mg by mouth twice weekly as needed. 20 capsule 3  . acyclovir cream (ZOVIRAX) 5 % Apply daily as needed 5 g 0  . aspirin 81 MG tablet Take 81 mg by mouth daily.     Marland Kitchen atenolol (TENORMIN) 25 MG tablet TAKE 1/2 TABLET BY MOUTH DAILY 30 tablet 2  . calcium carbonate (OS-CAL) 600 MG TABS Take 600 mg by mouth daily.     . Cholecalciferol (VITAMIN D) 1000 UNITS capsule Take 1,000 Units by mouth daily.     . Cranberry 1000 MG CAPS Take 1 capsule by mouth daily. ? Strength.  Take one by mouth daily.    . diphenhydrAMINE (BENADRYL) 25 mg capsule Take 25 mg by mouth every 6 (six) hours as needed.    . hydrocortisone (ANUSOL-HC) 2.5 % rectal cream Place 1 application rectally as needed. Apply to affected area once daily as needed for hemorrhoids. 30 g 1  . Hydrocortisone Butyr Lipo Base (LOCOID LIPOCREAM) 0.1 % CREA Apply to affected areas once daily as needed.     . magnesium gluconate (MAGONATE) 500 MG tablet Take 500 mg by mouth daily.    . Multiple Vitamin (MULTIVITAMIN) tablet Take 1 tablet by mouth daily.     Marland Kitchen esomeprazole (NEXIUM) 40 MG capsule Take 40 mg by mouth as needed.     No current facility-administered medications on file prior to visit.     Review of Systems Review of Systems  Constitutional:  Negative for fever, appetite change, fatigue and unexpected weight change.  Eyes: Negative for pain and visual disturbance.  Respiratory:  Negative for cough and shortness of breath.   Cardiovascular: Negative for cp or palpitations    Gastrointestinal: Negative for nausea, diarrhea and constipation.  Genitourinary: Negative for urgency and frequency.  Skin: Negative for pallor or rash   MSK pos for knee pain  Neurological: Negative for weakness, light-headedness, numbness and pos for occ  headaches.  Hematological: Negative for adenopathy. Does not bruise/bleed easily.  Psychiatric/Behavioral: Negative for dysphoric mood. The patient is not nervous/anxious.          Objective:   Physical Exam  Constitutional: She appears well-developed and well-nourished. No distress.  overwt and well app  HENT:  Head: Normocephalic and atraumatic.  Mouth/Throat: Oropharynx is clear and moist.  Eyes: Conjunctivae and EOM are normal. Pupils are equal, round, and reactive to light. Right eye exhibits no discharge. Left eye exhibits no discharge. No scleral icterus.  Neck: Normal range of motion. Neck supple. No JVD present. Carotid bruit is not present. No thyromegaly present.  Cardiovascular: Normal rate, regular rhythm, normal heart sounds and intact distal pulses.  Exam reveals no gallop.   Pulmonary/Chest: Effort normal and breath sounds normal. No respiratory distress. She has no wheezes. She has no rales.  Abdominal: Soft. Bowel sounds are normal. She exhibits no distension and no mass. There is no tenderness.  Musculoskeletal: She exhibits no edema or tenderness.  Lymphadenopathy:    She has no cervical adenopathy.  Neurological: She is alert. She has normal reflexes. No cranial nerve deficit. She exhibits normal muscle tone. Coordination normal.  Skin: Skin is warm and dry. No rash noted. No erythema. No pallor.  Psychiatric: She has a normal mood and affect.  Nursing note and vitals  reviewed.         Assessment & Plan:   Problem List Items Addressed This Visit      Other   Pre-operative examination - Primary    For knee surgery in healthy former smoker with no heart or lung problems EKG stable with rate of 63 and low voltage/baseline  Rev med allergies No anesthesia problems in the past  Will hold asa and aleve prior to surgery  Cleared medically       Relevant Orders   EKG 12-Lead (Completed)

## 2014-10-21 NOTE — Patient Instructions (Signed)
You are cleared for surgery Take care of yourself  I will send notes to Dr Maureen Ralphs

## 2014-10-21 NOTE — Progress Notes (Signed)
Pre visit review using our clinic review tool, if applicable. No additional management support is needed unless otherwise documented below in the visit note. 

## 2014-10-23 NOTE — Assessment & Plan Note (Signed)
For knee surgery in healthy former smoker with no heart or lung problems EKG stable with rate of 63 and low voltage/baseline  Rev med allergies No anesthesia problems in the past  Will hold asa and aleve prior to surgery  Cleared medically

## 2014-11-07 ENCOUNTER — Other Ambulatory Visit: Payer: Self-pay

## 2014-11-07 ENCOUNTER — Other Ambulatory Visit: Payer: Self-pay | Admitting: Gynecology

## 2014-11-07 DIAGNOSIS — N6002 Solitary cyst of left breast: Secondary | ICD-10-CM

## 2014-11-11 NOTE — Patient Instructions (Addendum)
[name] [name]  11/11/2014   Your procedure is scheduled on: 11/21/14   Report to [facility] Main  Entrance and follow signs to               [place] at 9:40 AM.   Call this number if you have problems the morning of surgery [phone]   Remember:  Do not eat food or drink liquids :After Midnight.     Take these medicines the morning of surgery with A SIP OF WATER: NEXIUM / MAY USE EYE DROPS                               You may not have any metal on your body including hair pins and              piercings  Do not wear jewelry, make-up, lotions, powders or perfumes.             Do not wear nail polish.  Do not shave  48 hours prior to surgery.              Men may shave face and neck.   Do not bring valuables to the hospital. [place].  Contacts, dentures or bridgework may not be worn into surgery.  Leave suitcase in the car. After surgery it may be brought to your room.     Patients discharged the day of surgery will not be allowed to drive home.  Name and phone number of your driver:  Special Instructions: N/A              Please read over the following fact sheets you were given: _____________________________________________________________________                                                     [place]  Before surgery, you can play an important role.  Because skin is not sterile, your skin needs to be as free of germs as possible.  You can reduce the number of germs on your skin by washing with CHG (chlorahexidine gluconate) soap before surgery.  CHG is an antiseptic cleaner which kills germs and bonds with the skin to continue killing germs even after washing. Please DO NOT use if you have an allergy to CHG or antibacterial soaps.  If your skin becomes reddened/irritated stop using the CHG and inform your nurse when you arrive at Short Stay. Do not shave  (including legs and underarms) for at least 48 hours prior to the first CHG shower.  You may shave your face. Please follow these instructions carefully:   1.  Shower with CHG Soap the night before surgery and the  morning of Surgery.   2.  If you choose to wash your hair, wash your hair first as usual with your  normal  Shampoo.   3.  After you shampoo, rinse your hair and body thoroughly to remove the  shampoo.  4.  Use CHG as you would any other liquid soap.  You can apply chg directly  to the skin and wash . Gently wash with scrungie or clean wascloth    5.  Apply the CHG Soap to your body ONLY FROM THE NECK DOWN.   Do not use on open                           Wound or open sores. Avoid contact with eyes, ears mouth and genitals (private parts).                        Genitals (private parts) with your normal soap.              6.  Wash thoroughly, paying special attention to the area where your surgery  will be performed.   7.  Thoroughly rinse your body with warm water from the neck down.   8.  DO NOT shower/wash with your normal soap after using and rinsing off  the CHG Soap .                9.  Pat yourself dry with a clean towel.             10.  Wear clean pajamas.             11.  Place clean sheets on your bed the night of your first shower and do not  sleep with pets.  Day of Surgery : Do not apply any lotions/deodorants the morning of surgery.  Please wear clean clothes to the hospital/surgery center.  FAILURE TO FOLLOW THESE INSTRUCTIONS MAY RESULT IN THE CANCELLATION OF YOUR SURGERY    PATIENT SIGNATURE_________________________________  ______________________________________________________________________     Adam Phenix  An incentive spirometer is a tool that can help keep your lungs clear and active. This tool measures how well you are filling your lungs with each breath. Taking long deep breaths may help  reverse or decrease the chance of developing breathing (pulmonary) problems (especially infection) following:  A long period of time when you are unable to move or be active. BEFORE THE PROCEDURE   If the spirometer includes an indicator to show your best effort, your nurse or respiratory therapist will set it to a desired goal.  If possible, sit up straight or lean slightly forward. Try not to slouch.  Hold the incentive spirometer in an upright position. INSTRUCTIONS FOR USE   Sit on the edge of your bed if possible, or sit up as far as you can in bed or on a chair.  Hold the incentive spirometer in an upright position.  Breathe out normally.  Place the mouthpiece in your mouth and seal your lips tightly around it.  Breathe in slowly and as deeply as possible, raising the piston or the ball toward the top of the column.  Hold your breath for 3-5 seconds or for as long as possible. Allow the piston or ball to fall to the bottom of the column.  Remove the mouthpiece from your mouth and breathe out normally.  Rest for a few seconds and repeat Steps 1 through 7 at least 10 times every 1-2 hours when you are awake. Take your time and take a few normal breaths between deep breaths.  The spirometer may include an indicator to show your best effort. Use the indicator as a goal to work toward during  each repetition.  After each set of 10 deep breaths, practice coughing to be sure your lungs are clear. If you have an incision (the cut made at the time of surgery), support your incision when coughing by placing a pillow or rolled up towels firmly against it. Once you are able to get out of bed, walk around indoors and cough well. You may stop using the incentive spirometer when instructed by your caregiver.  RISKS AND COMPLICATIONS  Take your time so you do not get dizzy or light-headed.  If you are in pain, you may need to take or ask for pain medication before doing incentive spirometry.  It is harder to take a deep breath if you are having pain. AFTER USE  Rest and breathe slowly and easily.  It can be helpful to keep track of a log of your progress. Your caregiver can provide you with a simple table to help with this. If you are using the spirometer at home, follow these instructions: [place] IF:   You are having difficultly using the spirometer.  You have trouble using the spirometer as often as instructed.  Your pain medication is not giving enough relief while using the spirometer.  You develop fever of 100.5 F (38.1 C) or higher. SEEK IMMEDIATE MEDICAL CARE IF:   You cough up bloody sputum that had not been present before.  You develop fever of 102 F (38.9 C) or greater.  You develop worsening pain at or near the incision site. MAKE SURE YOU:   Understand these instructions.  Will watch your condition.  Will get help right away if you are not doing well or get worse. Document Released: 02/03/2007 Document Revised: 12/16/2011 Document Reviewed: 04/06/2007 Exit[employer] Patient Information 2014 [employer], [place].   ________________________________________________________________________  WHAT IS A BLOOD TRANSFUSION? Blood Transfusion Information  A transfusion is the replacement of blood or some of its parts. Blood is made up of multiple cells which provide different functions.  Red blood cells carry oxygen and are used for blood loss replacement.  White blood cells fight against infection.  Platelets control bleeding.  Plasma helps clot blood.  Other blood products are available for specialized needs, such as hemophilia or other clotting disorders. BEFORE THE TRANSFUSION  Who gives blood for transfusions?   Healthy volunteers who are fully evaluated to make sure their blood is safe. This is blood bank blood. Transfusion therapy is the safest it has ever been in the practice of medicine. Before blood is taken from a donor, a complete  history is taken to make sure that person has no history of diseases nor engages in risky social behavior (examples are intravenous drug use or sexual activity with multiple partners). The donor's travel history is screened to minimize risk of transmitting infections, such as malaria. The donated blood is tested for signs of infectious diseases, such as HIV and hepatitis. The blood is then tested to be sure it is compatible with you in order to minimize the chance of a transfusion reaction. If you or a relative donates blood, this is often done in anticipation of surgery and is not appropriate for emergency situations. It takes many days to process the donated blood. RISKS AND COMPLICATIONS Although transfusion therapy is very safe and saves many lives, the main dangers of transfusion include:   Getting an infectious disease.  Developing a transfusion reaction. This is an allergic reaction to something in the blood you were given. Every precaution is taken to prevent  this. The decision to have a blood transfusion has been considered carefully by your caregiver before blood is given. Blood is not given unless the benefits outweigh the risks. AFTER THE TRANSFUSION  Right after receiving a blood transfusion, you will usually feel much better and more energetic. This is especially true if your red blood cells have gotten low (anemic). The transfusion raises the level of the red blood cells which carry oxygen, and this usually causes an energy increase.  The nurse administering the transfusion will monitor you carefully for complications. HOME CARE INSTRUCTIONS  No special instructions are needed after a transfusion. You may find your energy is better. Speak with your caregiver about any limitations on activity for underlying diseases you may have. SEEK MEDICAL CARE IF:   Your condition is not improving after your transfusion.  You develop redness or irritation at the intravenous (IV) site. SEEK  IMMEDIATE MEDICAL CARE IF:  Any of the following symptoms occur over the next 12 hours:  Shaking chills.  You have a temperature by mouth above 102 F (38.9 C), not controlled by medicine.  Chest, back, or muscle pain.  People around you feel you are not acting correctly or are confused.  Shortness of breath or difficulty breathing.  Dizziness and fainting.  You get a rash or develop hives.  You have a decrease in urine output.  Your urine turns a dark color or changes to pink, red, or brown. Any of the following symptoms occur over the next 10 days:  You have a temperature by mouth above 102 F (38.9 C), not controlled by medicine.  Shortness of breath.  Weakness after normal activity.  The white part of the eye turns yellow (jaundice).  You have a decrease in the amount of urine or are urinating less often.  Your urine turns a dark color or changes to pink, red, or brown. Document Released: 09/20/2000 Document Revised: 12/16/2011 Document Reviewed: 05/09/2008 [facility] Patient Information 2014 [place], [place].  _______________________________________________________________________

## 2014-11-14 ENCOUNTER — Encounter (HOSPITAL_COMMUNITY)
Admission: RE | Admit: 2014-11-14 | Discharge: 2014-11-14 | Disposition: A | Discharge status: Home / Self Care | Payer: Managed Care, Other (non HMO) | Source: Ambulatory Visit | Attending: Orthopedic Surgery | Admitting: Orthopedic Surgery

## 2014-11-14 ENCOUNTER — Encounter (HOSPITAL_COMMUNITY): Payer: Self-pay

## 2014-11-14 DIAGNOSIS — M179 Osteoarthritis of knee, unspecified: Secondary | ICD-10-CM | POA: Diagnosis not present

## 2014-11-14 DIAGNOSIS — Z01818 Encounter for other preprocedural examination: Secondary | ICD-10-CM | POA: Diagnosis present

## 2014-11-14 HISTORY — DX: Other specified postprocedural states: Z98.890

## 2014-11-14 HISTORY — DX: Sleep disorder, unspecified: G47.9

## 2014-11-14 HISTORY — DX: Nonrheumatic mitral (valve) prolapse: I34.1

## 2014-11-14 HISTORY — DX: Nausea with vomiting, unspecified: R11.2

## 2014-11-14 LAB — COMPREHENSIVE METABOLIC PANEL
ALT: 22 U/L (ref 0–35)
ANION GAP: 6 (ref 5–15)
AST: 24 U/L (ref 0–37)
Albumin: 4.3 g/dL (ref 3.5–5.2)
Alkaline Phosphatase: 74 U/L (ref 39–117)
BUN: 14 mg/dL (ref 6–23)
CHLORIDE: 104 mmol/L (ref 96–112)
CO2: 31 mmol/L (ref 19–32)
CREATININE: 0.81 mg/dL (ref 0.50–1.10)
Calcium: 10 mg/dL (ref 8.4–10.5)
GFR calc Af Amer: 88 mL/min — ABNORMAL LOW (ref >90)
GFR calc non Af Amer: 76 mL/min — ABNORMAL LOW (ref >90)
Glucose, Bld: 63 mg/dL — ABNORMAL LOW (ref 70–99)
Potassium: 4 mmol/L (ref 3.5–5.1)
Sodium: 141 mmol/L (ref 135–145)
TOTAL PROTEIN: 7.2 g/dL (ref 6.0–8.3)
Total Bilirubin: 0.7 mg/dL (ref 0.3–1.2)

## 2014-11-14 LAB — CBC
HCT: 43.8 % (ref 36.0–46.0)
Hemoglobin: 15.2 g/dL — ABNORMAL HIGH (ref 12.0–15.0)
MCH: 33.1 pg (ref 26.0–34.0)
MCHC: 34.7 g/dL (ref 30.0–36.0)
MCV: 95.4 fL (ref 78.0–100.0)
PLATELETS: 200 10*3/uL (ref 150–400)
RBC: 4.59 MIL/uL (ref 3.87–5.11)
RDW: 12.7 % (ref 11.5–15.5)
WBC: 5.3 10*3/uL (ref 4.0–10.5)

## 2014-11-14 LAB — PROTIME-INR
INR: 1.06 (ref 0.00–1.49)
Prothrombin Time: 14 seconds (ref 11.6–15.2)

## 2014-11-14 LAB — URINALYSIS, ROUTINE W REFLEX MICROSCOPIC
Bilirubin Urine: NEGATIVE
GLUCOSE, UA: NEGATIVE mg/dL
HGB URINE DIPSTICK: NEGATIVE
Ketones, ur: NEGATIVE mg/dL
Nitrite: NEGATIVE
Protein, ur: NEGATIVE mg/dL
SPECIFIC GRAVITY, URINE: 1.007 (ref 1.005–1.030)
UROBILINOGEN UA: 0.2 mg/dL (ref 0.0–1.0)
pH: 6.5 (ref 5.0–8.0)

## 2014-11-14 LAB — SURGICAL PCR SCREEN
MRSA, PCR: NEGATIVE
STAPHYLOCOCCUS AUREUS: NEGATIVE

## 2014-11-14 LAB — URINE MICROSCOPIC-ADD ON

## 2014-11-14 LAB — APTT: APTT: 39 s — AB (ref 24–37)

## 2014-11-18 NOTE — H&P (Signed)
TOTAL KNEE ADMISSION H&P  Patient is being admitted for left total knee arthroplasty.  Subjective:  Chief Complaint:left knee pain.  HPI: [name] [name], 64 y.o. female, has a history of pain and functional disability in the left knee due to arthritis and has failed non-surgical conservative treatments for greater than 12 weeks to includeNSAID's and/or analgesics, corticosteriod injections, viscosupplementation injections and activity modification.  Onset of symptoms was gradual, starting 4 years ago with gradually worsening course since that time. The patient noted prior procedures on the knee to include  arthroscopy and menisectomy on the left knee(s).  Patient currently rates pain in the left knee(s) at 9 out of 10 with activity. Patient has night pain, worsening of pain with activity and weight bearing, pain that interferes with activities of daily living, pain with passive range of motion, crepitus and joint swelling.  Patient has evidence of periarticular osteophytes and joint space narrowing by imaging studies. There is no active infection.  Patient Active Problem List   Diagnosis Date Noted  . Pre-operative examination 10/21/2014  . UTI (urinary tract infection) 07/25/2014  . Colon cancer screening 11/29/2013  . Routine general medical examination at a health care facility 09/16/2011  . DERMATITIS, ATOPIC 11/27/2010  . URTICARIA DUE TO COLD OR HEAT 11/27/2010  . STRESS REACTION, ACUTE, WITH EMOTIONAL DISTURBANCE 09/14/2010  . THROAT PAIN, CHRONIC 09/14/2010  . EXTERNAL HEMORRHOIDS WITHOUT MENTION COMP 06/25/2010  . ABNORMAL EKG 12/26/2009  . OTHER CHRONIC SINUSITIS 10/19/2009  . UNSPECIFIED VITAMIN D DEFICIENCY 04/21/2009  . ALLERGIC RHINITIS 11/09/2008  . GERD 07/28/2008  . OSTEOPENIA 03/14/2008  . HYPERCHOLESTEROLEMIA, PURE 03/13/2007  . HIATAL HERNIA 01/12/2007   Past Medical History  Diagnosis Date  . Melanoma of skin   . Basal cell cancer 5/08  . Hyperlipidemia DIET  CONTROL  . GERD (gastroesophageal reflux disease)     HH  . Eczema   . Chest pain 9/11    [facility]) and palpitations - ruled out for MI with nl. Echo  . Heart palpitations OCCASIONAL--  TAKES ATENOLOL PRN  . Normal nuclear stress test 07-09-10  . Arthritis     NECK  . Complication of anesthesia HARD TO WAKE  . Hiatal hernia   . Osteopenia 12/2013    T score -1.6 FRAX 16%/0.8%  . HSV-2 infection     buttocks  . Breast cyst   . PONV (postoperative nausea and vomiting)   . MVP (mitral valve prolapse)     per pt no treatment needed  . Difficulty sleeping   . Acute meniscal tear of knee     Past Surgical History  Procedure Laterality Date  . Mri neck  3/00    C4-C5 herniation small stenosis mild C4-5, C5-6  . Melanoma excision      TRUNK  . Carotid US  9/05    Mild, recheck 1 year  . Carotid US  10/06    Normal  . Esophagogastroduodenoscopy  9/07    Normal  . Abnormal mole removed from leg - ? basal cell    . Hysteroscopy with resectoscope  2000    POLPECTOMY'S  . Tubal ligation  23 YRS AGO  . Tonsillectomy    . Transthoracic echocardiogram  06-29-2010    NORMAL LVSF, EF 55-60%,  MILD MR  . Knee arthroscopy  10/23/2011    Procedure: ARTHROSCOPY KNEE;  Surgeon: Gearlean Alf, MD;  Location: [facility];  Service: Orthopedics;  Laterality: Left;  LEFT KNEE SCOPE WITH DEBRIDEMENT   .  Chondroplasty  10/23/2011    Procedure: CHONDROPLASTY;  Surgeon: Gearlean Alf, MD;  Location: [facility];  Service: Orthopedics;  Laterality: Left;  Left medial   . Vaginal hysterectomy  2001    complex hyperplasia with mother's history of uterine cancer    Current outpatient prescriptions:  .  acyclovir (ZOVIRAX) 200 MG capsule, Take 1 capsule (200 mg total) by mouth as needed. Take 200 mg by mouth twice weekly as needed., Disp: 30 capsule, Rfl: 3 .  acyclovir cream (ZOVIRAX) 5 %, Apply daily as needed (Patient taking differently: Take 1 application by mouth  daily as needed (Cold sores). Apply daily as needed), Disp: 5 g, Rfl: 0 .  aspirin 81 MG tablet, Take 81 mg by mouth daily. , Disp: , Rfl:  .  atenolol (TENORMIN) 25 MG tablet, TAKE 1/2 TABLET BY MOUTH DAILY (Patient taking differently: TAKE 1/2 TABLET BY MOUTH DAILY PRN PALPITAIONS), Disp: 30 tablet, Rfl: 2 .  BIOTIN PO, Take 1 tablet by mouth daily., Disp: , Rfl:  .  calcium carbonate (OS-CAL) 600 MG TABS, Take 600 mg by mouth daily. , Disp: , Rfl:  .  Cholecalciferol (VITAMIN D) 1000 UNITS capsule, Take 2,000 Units by mouth daily. , Disp: , Rfl:  .  CINNAMON PO, Take 1 tablet by mouth daily., Disp: , Rfl:  .  Cranberry 1000 MG CAPS, Take 1 capsule by mouth daily as needed (UTI). ? Strength.  Take one by mouth daily., Disp: , Rfl:  .  Cyanocobalamin (VITAMIN B-12 PO), Take 1 tablet by mouth daily., Disp: , Rfl:  .  diclofenac sodium (VOLTAREN) 1 % GEL, Apply 1 application topically 3 (three) times daily as needed (knee pain)., Disp: , Rfl:  .  diphenhydrAMINE (BENADRYL) 25 mg capsule, Take 25 mg by mouth every 6 (six) hours as needed for sleep. , Disp: , Rfl:  .  esomeprazole (NEXIUM) 40 MG capsule, Take 40 mg by mouth daily as needed (Acid reflux). , Disp: , Rfl:  .  hydrocortisone (ANUSOL-HC) 2.5 % rectal cream, Place 1 application rectally as needed. Apply to affected area once daily as needed for hemorrhoids., Disp: 30 g, Rfl: 1 .  magnesium gluconate (MAGONATE) 500 MG tablet, Take 500 mg by mouth daily., Disp: , Rfl:  .  Multiple Vitamin (MULTIVITAMIN) tablet, Take 1 tablet by mouth daily. , Disp: , Rfl:  .  Polyethyl Glycol-Propyl Glycol (SYSTANE OP), Apply 1 drop to eye daily as needed (Dry eyes)., Disp: , Rfl:   Allergies  Allergen Reactions  . Amoxicillin Rash  . Codeine Rash  . Clarithromycin     REACTION: reaction not known    History  Substance Use Topics  . Smoking status: Former Smoker    Types: Cigarettes    Quit date: 10/07/1977  . Smokeless tobacco: Never Used  .  Alcohol Use: No    Family History  Problem Relation Age of Onset  . Hypertension Mother   . Cancer Mother     uterine  . Obesity Mother   . Thrombocytopenia Mother   . Diabetes Mother   . Alcohol abuse Father   . Heart disease Father     CAD in 68's  . Emphysema Father   . Heart disease Paternal Aunt     CAD  . Heart disease Paternal Uncle     CAD  . Colon cancer Neg Hx   . Diabetes Maternal Grandmother     type 2  . Diabetes Maternal Grandfather  type 2     Review of Systems  Constitutional: Negative.   HENT: Negative.   Eyes: Negative.   Respiratory: Negative.   Cardiovascular: Positive for palpitations. Negative for chest pain, orthopnea, claudication, leg swelling and PND.  Gastrointestinal: Positive for heartburn. Negative for nausea, vomiting, abdominal pain, diarrhea, constipation, blood in stool and melena.  Genitourinary: Negative.   Musculoskeletal: Positive for joint pain. Negative for myalgias, back pain, falls and neck pain.       Left knee pain  Skin: Negative.   Neurological: Negative.   Endo/Heme/Allergies: Negative.   Psychiatric/Behavioral: Negative for depression, suicidal ideas, hallucinations, memory loss and substance abuse. The patient is nervous/anxious. The patient does not have insomnia.     Objective:  Physical Exam  Constitutional: She is oriented to person, place, and time. She appears well-developed. No distress.  Overweight  HENT:  Head: Normocephalic and atraumatic.  Right Ear: External ear normal.  Left Ear: External ear normal.  Nose: Nose normal.  Mouth/Throat: Oropharynx is clear and moist.  Eyes: Conjunctivae and EOM are normal.  Neck: Normal range of motion. Neck supple.  Cardiovascular: Normal rate, regular rhythm and intact distal pulses.   Murmur heard.  Systolic murmur is present with a grade of 3/6  Respiratory: Effort normal and breath sounds normal. No respiratory distress. She has no wheezes.  GI: Soft. Bowel  sounds are normal. She exhibits no distension. There is no tenderness.  Musculoskeletal:       Right hip: Normal.       Left hip: Normal.       Right knee: She exhibits normal range of motion, no swelling, no effusion and no erythema. Tenderness found. Medial joint line tenderness noted. No lateral joint line tenderness noted.       Left knee: She exhibits decreased range of motion and swelling. She exhibits no effusion and no erythema. Tenderness found. Medial joint line and lateral joint line tenderness noted.       Right lower leg: She exhibits no tenderness and no swelling.       Left lower leg: She exhibits no tenderness and no swelling.  The left knee she is very tender along the medial jointline, less so along the lateral jointline. She does have a varus deformity noted. She has discomfort with passive and active range of motion. She lacks about 5 degrees of extension and significant patellofemoral crepitus, flexion back to 115 degrees. She does have increased discomfort at the endpoints of range of motion.  Neurological: She is alert and oriented to person, place, and time. She has normal strength and normal reflexes. No sensory deficit.  Skin: No rash noted. She is not diaphoretic. No erythema.  Psychiatric: She has a normal mood and affect. Her behavior is normal.   Vitals  Weight: 174.2 lb Height: 66in Body Surface Area: 1.89 m Body Mass Index: 28.12 kg/m  Pulse: 76 (Regular)  BP: 118/76 (Sitting, Left Arm, Standard)   Imaging Review Plain radiographs demonstrate severe degenerative joint disease of the left knee(s). The overall alignment ismild varus. The bone quality appears to be good for age and reported activity level.  Assessment/Plan:  End stage primary osteoarthritis, left knee   The patient history, physical examination, clinical judgment of the provider and imaging studies are consistent with end stage degenerative joint disease of the left knee(s) and total  knee arthroplasty is deemed medically necessary. The treatment options including medical management, injection therapy arthroscopy and arthroplasty were discussed at length. The risks  and benefits of total knee arthroplasty were presented and reviewed. The risks due to aseptic loosening, infection, stiffness, patella tracking problems, thromboembolic complications and other imponderables were discussed. The patient acknowledged the explanation, agreed to proceed with the plan and consent was signed. Patient is being admitted for inpatient treatment for surgery, pain control, PT, OT, prophylactic antibiotics, VTE prophylaxis, progressive ambulation and ADL's and discharge planning. The patient is planning to be discharged home with home health services    [place] with [facility] with [facility]  PCP: Dr. Bernita Buffy, PA-C

## 2014-11-21 ENCOUNTER — Encounter (HOSPITAL_COMMUNITY)
Admission: RE | Disposition: A | Discharge status: Home Health | Payer: Self-pay | Source: Ambulatory Visit | Attending: Orthopedic Surgery

## 2014-11-21 ENCOUNTER — Inpatient Hospital Stay (HOSPITAL_COMMUNITY)
Admission: RE | Admit: 2014-11-21 | Discharge: 2014-11-23 | DRG: 470 | Disposition: A | Discharge status: Home Health | Payer: Managed Care, Other (non HMO) | Source: Ambulatory Visit | Attending: Orthopedic Surgery | Admitting: Orthopedic Surgery

## 2014-11-21 ENCOUNTER — Other Ambulatory Visit: Payer: Self-pay | Admitting: Orthopedic Surgery

## 2014-11-21 ENCOUNTER — Inpatient Hospital Stay (HOSPITAL_COMMUNITY): Payer: Managed Care, Other (non HMO) | Admitting: Anesthesiology

## 2014-11-21 ENCOUNTER — Encounter (HOSPITAL_COMMUNITY): Payer: Self-pay | Admitting: *Deleted

## 2014-11-21 DIAGNOSIS — M25562 Pain in left knee: Secondary | ICD-10-CM | POA: Diagnosis present

## 2014-11-21 DIAGNOSIS — Z87891 Personal history of nicotine dependence: Secondary | ICD-10-CM | POA: Diagnosis not present

## 2014-11-21 DIAGNOSIS — Z79899 Other long term (current) drug therapy: Secondary | ICD-10-CM | POA: Diagnosis not present

## 2014-11-21 DIAGNOSIS — Z7982 Long term (current) use of aspirin: Secondary | ICD-10-CM | POA: Diagnosis not present

## 2014-11-21 DIAGNOSIS — Z85820 Personal history of malignant melanoma of skin: Secondary | ICD-10-CM | POA: Diagnosis not present

## 2014-11-21 DIAGNOSIS — E785 Hyperlipidemia, unspecified: Secondary | ICD-10-CM | POA: Diagnosis present

## 2014-11-21 DIAGNOSIS — M1710 Unilateral primary osteoarthritis, unspecified knee: Secondary | ICD-10-CM | POA: Diagnosis present

## 2014-11-21 DIAGNOSIS — M1712 Unilateral primary osteoarthritis, left knee: Principal | ICD-10-CM | POA: Diagnosis present

## 2014-11-21 DIAGNOSIS — M179 Osteoarthritis of knee, unspecified: Secondary | ICD-10-CM | POA: Diagnosis present

## 2014-11-21 DIAGNOSIS — Z01812 Encounter for preprocedural laboratory examination: Secondary | ICD-10-CM | POA: Diagnosis not present

## 2014-11-21 DIAGNOSIS — M8580 Other specified disorders of bone density and structure, unspecified site: Secondary | ICD-10-CM | POA: Diagnosis present

## 2014-11-21 HISTORY — PX: TOTAL KNEE ARTHROPLASTY: SHX125

## 2014-11-21 LAB — TYPE AND SCREEN
ABO/RH(D): O NEG
Antibody Screen: NEGATIVE

## 2014-11-21 LAB — ABO/RH: ABO/RH(D): O NEG

## 2014-11-21 SURGERY — ARTHROPLASTY, KNEE, TOTAL
Anesthesia: General | Site: Knee | Laterality: Left

## 2014-11-21 MED ORDER — MORPHINE SULFATE 2 MG/ML IJ SOLN
1.0000 mg | INTRAMUSCULAR | Status: DC | PRN
Start: 2014-11-21 — End: 2014-11-23

## 2014-11-21 MED ORDER — PHENYLEPHRINE HCL 10 MG/ML IJ SOLN
INTRAMUSCULAR | Status: DC | PRN
  Administered 2014-11-21 (×3): 80 ug via INTRAVENOUS

## 2014-11-21 MED ORDER — SODIUM CHLORIDE 0.9 % IV SOLN: INTRAVENOUS | Status: DC

## 2014-11-21 MED ORDER — 0.9 % SODIUM CHLORIDE (POUR BTL) OPTIME
TOPICAL | Status: DC | PRN
  Administered 2014-11-21: 1000 mL

## 2014-11-21 MED ORDER — SUCCINYLCHOLINE CHLORIDE 20 MG/ML IJ SOLN
INTRAMUSCULAR | Status: DC | PRN
  Administered 2014-11-21: 100 mg via INTRAVENOUS

## 2014-11-21 MED ORDER — DEXAMETHASONE SODIUM PHOSPHATE 10 MG/ML IJ SOLN
INTRAMUSCULAR | Status: AC
  Filled 2014-11-21: qty 1

## 2014-11-21 MED ORDER — PROPOFOL 10 MG/ML IV BOLUS
INTRAVENOUS | Status: AC
  Filled 2014-11-21: qty 20

## 2014-11-21 MED ORDER — ACETAMINOPHEN 650 MG RE SUPP: 650.0000 mg | Freq: Four times a day (QID) | RECTAL | Status: DC | PRN

## 2014-11-21 MED ORDER — FENTANYL CITRATE 0.05 MG/ML IJ SOLN
INTRAMUSCULAR | Status: DC | PRN
  Administered 2014-11-21 (×4): 50 ug via INTRAVENOUS

## 2014-11-21 MED ORDER — KCL IN DEXTROSE-NACL 20-5-0.9 MEQ/L-%-% IV SOLN
INTRAVENOUS | Status: DC
  Administered 2014-11-21 – 2014-11-22 (×2): via INTRAVENOUS
  Filled 2014-11-21 (×4): qty 1000

## 2014-11-21 MED ORDER — DEXTROSE 5 % IV SOLN
500.0000 mg | Freq: Four times a day (QID) | INTRAVENOUS | Status: DC | PRN
  Administered 2014-11-21: 500 mg via INTRAVENOUS
  Filled 2014-11-21 (×2): qty 5

## 2014-11-21 MED ORDER — BUPIVACAINE HCL (PF) 0.25 % IJ SOLN
INTRAMUSCULAR | Status: AC
  Filled 2014-11-21: qty 30

## 2014-11-21 MED ORDER — HYDROMORPHONE HCL 2 MG/ML IJ SOLN
INTRAMUSCULAR | Status: AC
  Filled 2014-11-21: qty 1

## 2014-11-21 MED ORDER — MIDAZOLAM HCL 5 MG/5ML IJ SOLN
INTRAMUSCULAR | Status: DC | PRN
  Administered 2014-11-21: 2 mg via INTRAVENOUS

## 2014-11-21 MED ORDER — LACTATED RINGERS IV SOLN: INTRAVENOUS | Status: DC

## 2014-11-21 MED ORDER — DEXAMETHASONE SODIUM PHOSPHATE 10 MG/ML IJ SOLN: 10.0000 mg | Freq: Once | INTRAMUSCULAR | Status: DC

## 2014-11-21 MED ORDER — HYDROMORPHONE HCL 1 MG/ML IJ SOLN
INTRAMUSCULAR | Status: AC
  Filled 2014-11-21: qty 1

## 2014-11-21 MED ORDER — ONDANSETRON HCL 4 MG/2ML IJ SOLN
INTRAMUSCULAR | Status: AC
  Filled 2014-11-21: qty 2

## 2014-11-21 MED ORDER — SODIUM CHLORIDE 0.9 % IJ SOLN
INTRAMUSCULAR | Status: AC
  Filled 2014-11-21: qty 50

## 2014-11-21 MED ORDER — DOCUSATE SODIUM 100 MG PO CAPS
100.0000 mg | ORAL_CAPSULE | Freq: Two times a day (BID) | ORAL | Status: DC
  Administered 2014-11-21 – 2014-11-23 (×4): 100 mg via ORAL

## 2014-11-21 MED ORDER — CEFAZOLIN SODIUM-DEXTROSE 2-3 GM-% IV SOLR
2.0000 g | Freq: Four times a day (QID) | INTRAVENOUS | Status: AC
  Administered 2014-11-21 (×2): 2 g via INTRAVENOUS
  Filled 2014-11-21 (×2): qty 50

## 2014-11-21 MED ORDER — TRANEXAMIC ACID 100 MG/ML IV SOLN
1000.0000 mg | INTRAVENOUS | Status: AC
  Administered 2014-11-21: 1000 mg via INTRAVENOUS
  Filled 2014-11-21: qty 10

## 2014-11-21 MED ORDER — PROPOFOL 10 MG/ML IV BOLUS
INTRAVENOUS | Status: DC | PRN
  Administered 2014-11-21: 200 mg via INTRAVENOUS

## 2014-11-21 MED ORDER — FENTANYL CITRATE 0.05 MG/ML IJ SOLN
INTRAMUSCULAR | Status: AC
  Filled 2014-11-21: qty 2

## 2014-11-21 MED ORDER — MIDAZOLAM HCL 2 MG/2ML IJ SOLN
INTRAMUSCULAR | Status: AC
  Filled 2014-11-21: qty 2

## 2014-11-21 MED ORDER — SODIUM CHLORIDE 0.9 % IR SOLN
Status: DC | PRN
  Administered 2014-11-21: 1000 mL

## 2014-11-21 MED ORDER — GLYCOPYRROLATE 0.2 MG/ML IJ SOLN
INTRAMUSCULAR | Status: DC | PRN
  Administered 2014-11-21: 0.4 mg via INTRAVENOUS

## 2014-11-21 MED ORDER — ACETAMINOPHEN 10 MG/ML IV SOLN
1000.0000 mg | Freq: Once | INTRAVENOUS | Status: AC
  Administered 2014-11-21: 1000 mg via INTRAVENOUS
  Filled 2014-11-21: qty 100

## 2014-11-21 MED ORDER — METOCLOPRAMIDE HCL 10 MG PO TABS: 5.0000 mg | ORAL_TABLET | Freq: Three times a day (TID) | ORAL | Status: DC | PRN

## 2014-11-21 MED ORDER — TRAMADOL HCL 50 MG PO TABS: 50.0000 mg | ORAL_TABLET | Freq: Four times a day (QID) | ORAL | Status: DC | PRN

## 2014-11-21 MED ORDER — CHLORHEXIDINE GLUCONATE 4 % EX LIQD: 60.0000 mL | Freq: Once | CUTANEOUS | Status: DC

## 2014-11-21 MED ORDER — METOCLOPRAMIDE HCL 5 MG/ML IJ SOLN: 5.0000 mg | Freq: Three times a day (TID) | INTRAMUSCULAR | Status: DC | PRN

## 2014-11-21 MED ORDER — LACTATED RINGERS IV SOLN
INTRAVENOUS | Status: DC
  Administered 2014-11-21: 1000 mL via INTRAVENOUS
  Administered 2014-11-21: 13:00:00 via INTRAVENOUS

## 2014-11-21 MED ORDER — DEXAMETHASONE SODIUM PHOSPHATE 10 MG/ML IJ SOLN
10.0000 mg | Freq: Once | INTRAMUSCULAR | Status: AC
  Administered 2014-11-22: 10 mg via INTRAVENOUS
  Filled 2014-11-21 (×2): qty 1

## 2014-11-21 MED ORDER — ROCURONIUM BROMIDE 100 MG/10ML IV SOLN
INTRAVENOUS | Status: DC | PRN
  Administered 2014-11-21: 30 mg via INTRAVENOUS

## 2014-11-21 MED ORDER — FLEET ENEMA 7-19 GM/118ML RE ENEM: 1.0000 | ENEMA | Freq: Once | RECTAL | Status: AC | PRN

## 2014-11-21 MED ORDER — GLYCOPYRROLATE 0.2 MG/ML IJ SOLN
INTRAMUSCULAR | Status: AC
  Filled 2014-11-21: qty 2

## 2014-11-21 MED ORDER — DEXAMETHASONE SODIUM PHOSPHATE 10 MG/ML IJ SOLN
INTRAMUSCULAR | Status: DC | PRN
  Administered 2014-11-21: 10 mg via INTRAVENOUS

## 2014-11-21 MED ORDER — ONDANSETRON HCL 4 MG/2ML IJ SOLN
INTRAMUSCULAR | Status: DC | PRN
  Administered 2014-11-21: 4 mg via INTRAVENOUS

## 2014-11-21 MED ORDER — PHENYLEPHRINE 40 MCG/ML (10ML) SYRINGE FOR IV PUSH (FOR BLOOD PRESSURE SUPPORT)
PREFILLED_SYRINGE | INTRAVENOUS | Status: AC
  Filled 2014-11-21: qty 10

## 2014-11-21 MED ORDER — BUPIVACAINE LIPOSOME 1.3 % IJ SUSP
INTRAMUSCULAR | Status: DC | PRN
  Administered 2014-11-21: 20 mL

## 2014-11-21 MED ORDER — BUPIVACAINE HCL 0.25 % IJ SOLN
INTRAMUSCULAR | Status: DC | PRN
  Administered 2014-11-21: 20 mL

## 2014-11-21 MED ORDER — LIDOCAINE HCL (CARDIAC) 20 MG/ML IV SOLN
INTRAVENOUS | Status: DC | PRN
  Administered 2014-11-21: 100 mg via INTRAVENOUS

## 2014-11-21 MED ORDER — BUPIVACAINE LIPOSOME 1.3 % IJ SUSP
20.0000 mL | Freq: Once | INTRAMUSCULAR | Status: DC
  Filled 2014-11-21: qty 20

## 2014-11-21 MED ORDER — MENTHOL 3 MG MT LOZG
1.0000 | LOZENGE | OROMUCOSAL | Status: DC | PRN
  Filled 2014-11-21: qty 9

## 2014-11-21 MED ORDER — KETOROLAC TROMETHAMINE 15 MG/ML IJ SOLN
7.5000 mg | Freq: Four times a day (QID) | INTRAMUSCULAR | Status: AC | PRN
  Administered 2014-11-21 – 2014-11-22 (×2): 7.5 mg via INTRAVENOUS
  Filled 2014-11-21 (×2): qty 1

## 2014-11-21 MED ORDER — BISACODYL 10 MG RE SUPP: 10.0000 mg | Freq: Every day | RECTAL | Status: DC | PRN

## 2014-11-21 MED ORDER — ROCURONIUM BROMIDE 100 MG/10ML IV SOLN
INTRAVENOUS | Status: AC
  Filled 2014-11-21: qty 1

## 2014-11-21 MED ORDER — OXYCODONE HCL 5 MG PO TABS
5.0000 mg | ORAL_TABLET | ORAL | Status: DC | PRN
  Administered 2014-11-21 – 2014-11-22 (×9): 5 mg via ORAL
  Administered 2014-11-23 (×2): 10 mg via ORAL
  Administered 2014-11-23 (×2): 5 mg via ORAL
  Filled 2014-11-21 (×2): qty 1
  Filled 2014-11-21: qty 2
  Filled 2014-11-21 (×7): qty 1
  Filled 2014-11-21: qty 2
  Filled 2014-11-21 (×2): qty 1

## 2014-11-21 MED ORDER — ACETAMINOPHEN 325 MG PO TABS
650.0000 mg | ORAL_TABLET | Freq: Four times a day (QID) | ORAL | Status: DC | PRN
  Administered 2014-11-22 – 2014-11-23 (×2): 650 mg via ORAL
  Filled 2014-11-21 (×2): qty 2

## 2014-11-21 MED ORDER — METHOCARBAMOL 500 MG PO TABS
500.0000 mg | ORAL_TABLET | Freq: Four times a day (QID) | ORAL | Status: DC | PRN
  Administered 2014-11-22: 500 mg via ORAL
  Filled 2014-11-21 (×2): qty 1

## 2014-11-21 MED ORDER — CEFAZOLIN SODIUM-DEXTROSE 2-3 GM-% IV SOLR
INTRAVENOUS | Status: AC
  Filled 2014-11-21: qty 50

## 2014-11-21 MED ORDER — NEOSTIGMINE METHYLSULFATE 10 MG/10ML IV SOLN
INTRAVENOUS | Status: DC | PRN
  Administered 2014-11-21: 3 mg via INTRAVENOUS

## 2014-11-21 MED ORDER — DIPHENHYDRAMINE HCL 12.5 MG/5ML PO ELIX: 12.5000 mg | ORAL_SOLUTION | ORAL | Status: DC | PRN

## 2014-11-21 MED ORDER — ACETAMINOPHEN 500 MG PO TABS
1000.0000 mg | ORAL_TABLET | Freq: Four times a day (QID) | ORAL | Status: AC
  Administered 2014-11-21 – 2014-11-22 (×4): 1000 mg via ORAL
  Filled 2014-11-21 (×5): qty 2

## 2014-11-21 MED ORDER — LIDOCAINE HCL (CARDIAC) 20 MG/ML IV SOLN
INTRAVENOUS | Status: AC
  Filled 2014-11-21: qty 5

## 2014-11-21 MED ORDER — POLYETHYLENE GLYCOL 3350 17 G PO PACK: 17.0000 g | PACK | Freq: Every day | ORAL | Status: DC | PRN

## 2014-11-21 MED ORDER — ONDANSETRON HCL 4 MG/2ML IJ SOLN: 4.0000 mg | Freq: Four times a day (QID) | INTRAMUSCULAR | Status: DC | PRN

## 2014-11-21 MED ORDER — SODIUM CHLORIDE 0.9 % IJ SOLN
INTRAMUSCULAR | Status: DC | PRN
  Administered 2014-11-21: 30 mL

## 2014-11-21 MED ORDER — HYDROMORPHONE HCL 1 MG/ML IJ SOLN
INTRAMUSCULAR | Status: DC | PRN
  Administered 2014-11-21 (×3): 0.5 mg via INTRAVENOUS

## 2014-11-21 MED ORDER — RIVAROXABAN 10 MG PO TABS
10.0000 mg | ORAL_TABLET | Freq: Every day | ORAL | Status: DC
  Administered 2014-11-22 – 2014-11-23 (×2): 10 mg via ORAL
  Filled 2014-11-21 (×3): qty 1

## 2014-11-21 MED ORDER — HYDROMORPHONE HCL 1 MG/ML IJ SOLN
0.2500 mg | INTRAMUSCULAR | Status: DC | PRN
  Administered 2014-11-21 (×4): 0.5 mg via INTRAVENOUS

## 2014-11-21 MED ORDER — PHENOL 1.4 % MT LIQD: 1.0000 | OROMUCOSAL | Status: DC | PRN

## 2014-11-21 MED ORDER — CEFAZOLIN SODIUM-DEXTROSE 2-3 GM-% IV SOLR
2.0000 g | INTRAVENOUS | Status: AC
  Administered 2014-11-21: 2 g via INTRAVENOUS

## 2014-11-21 MED ORDER — ONDANSETRON HCL 4 MG PO TABS: 4.0000 mg | ORAL_TABLET | Freq: Four times a day (QID) | ORAL | Status: DC | PRN

## 2014-11-21 SURGICAL SUPPLY — 62 items
BAG ZIPLOCK 12X15 (MISCELLANEOUS) ×3 IMPLANT
BANDAGE ELASTIC 6 VELCRO ST LF (GAUZE/BANDAGES/DRESSINGS) ×3 IMPLANT
BANDAGE ESMARK 6X9 LF (GAUZE/BANDAGES/DRESSINGS) ×1 IMPLANT
BLADE SAG 18X100X1.27 (BLADE) ×3 IMPLANT
BLADE SAW SGTL 11.0X1.19X90.0M (BLADE) ×3 IMPLANT
BNDG ESMARK 6X9 LF (GAUZE/BANDAGES/DRESSINGS) ×3
BOWL SMART MIX CTS (DISPOSABLE) ×3 IMPLANT
CAPT KNEE TOTAL 3 ATTUNE ×3 IMPLANT
CEMENT HV SMART SET (Cement) ×6 IMPLANT
CLOSURE WOUND 1/2 X4 (GAUZE/BANDAGES/DRESSINGS) ×2
CUFF TOURN SGL QUICK 34 (TOURNIQUET CUFF) ×2
CUFF TRNQT CYL 34X4X40X1 (TOURNIQUET CUFF) ×1 IMPLANT
DECANTER SPIKE VIAL GLASS SM (MISCELLANEOUS) ×3 IMPLANT
DRAPE EXTREMITY T 121X128X90 (DRAPE) ×3 IMPLANT
DRAPE POUCH INSTRU U-SHP 10X18 (DRAPES) ×3 IMPLANT
DRAPE U-SHAPE 47X51 STRL (DRAPES) ×3 IMPLANT
DRSG ADAPTIC 3X8 NADH LF (GAUZE/BANDAGES/DRESSINGS) ×3 IMPLANT
DRSG PAD ABDOMINAL 8X10 ST (GAUZE/BANDAGES/DRESSINGS) ×3 IMPLANT
DURAPREP 26ML APPLICATOR (WOUND CARE) ×3 IMPLANT
ELECT REM PT RETURN 9FT ADLT (ELECTROSURGICAL) ×3
ELECTRODE REM PT RTRN 9FT ADLT (ELECTROSURGICAL) ×1 IMPLANT
EVACUATOR 1/8 PVC DRAIN (DRAIN) ×3 IMPLANT
FACESHIELD WRAPAROUND (MASK) ×15 IMPLANT
GAUZE SPONGE 4X4 12PLY STRL (GAUZE/BANDAGES/DRESSINGS) ×3 IMPLANT
GLOVE BIO SURGEON STRL SZ7 (GLOVE) ×3 IMPLANT
GLOVE BIO SURGEON STRL SZ7.5 (GLOVE) ×3 IMPLANT
GLOVE BIO SURGEON STRL SZ8 (GLOVE) ×6 IMPLANT
GLOVE BIOGEL PI IND STRL 7.0 (GLOVE) ×1 IMPLANT
GLOVE BIOGEL PI IND STRL 8 (GLOVE) ×1 IMPLANT
GLOVE BIOGEL PI IND STRL 8.5 (GLOVE) ×1 IMPLANT
GLOVE BIOGEL PI INDICATOR 7.0 (GLOVE) ×2
GLOVE BIOGEL PI INDICATOR 8 (GLOVE) ×2
GLOVE BIOGEL PI INDICATOR 8.5 (GLOVE) ×2
GOWN STRL REUS W/TWL LRG LVL3 (GOWN DISPOSABLE) ×6 IMPLANT
GOWN STRL REUS W/TWL XL LVL3 (GOWN DISPOSABLE) ×6 IMPLANT
HANDPIECE INTERPULSE COAX TIP (DISPOSABLE) ×2
IMMOBILIZER KNEE 20 (SOFTGOODS) ×3
IMMOBILIZER KNEE 20 THIGH 36 (SOFTGOODS) ×1 IMPLANT
KIT BASIN OR (CUSTOM PROCEDURE TRAY) ×3 IMPLANT
MANIFOLD NEPTUNE II (INSTRUMENTS) ×3 IMPLANT
NDL SAFETY ECLIPSE 18X1.5 (NEEDLE) ×2 IMPLANT
NEEDLE HYPO 18GX1.5 SHARP (NEEDLE) ×4
NS IRRIG 1000ML POUR BTL (IV SOLUTION) ×3 IMPLANT
PACK TOTAL JOINT (CUSTOM PROCEDURE TRAY) ×3 IMPLANT
PADDING CAST COTTON 6X4 STRL (CAST SUPPLIES) ×6 IMPLANT
PEN SKIN MARKING BROAD (MISCELLANEOUS) ×3 IMPLANT
POSITIONER SURGICAL ARM (MISCELLANEOUS) ×3 IMPLANT
SET HNDPC FAN SPRY TIP SCT (DISPOSABLE) ×1 IMPLANT
STRIP CLOSURE SKIN 1/2X4 (GAUZE/BANDAGES/DRESSINGS) ×4 IMPLANT
SUCTION FRAZIER 12FR DISP (SUCTIONS) ×3 IMPLANT
SUT MNCRL AB 4-0 PS2 18 (SUTURE) ×3 IMPLANT
SUT VIC AB 2-0 CT1 27 (SUTURE) ×6
SUT VIC AB 2-0 CT1 TAPERPNT 27 (SUTURE) ×3 IMPLANT
SUT VLOC 180 0 24IN GS25 (SUTURE) ×3 IMPLANT
SYR 20CC LL (SYRINGE) ×3 IMPLANT
SYR 50ML LL SCALE MARK (SYRINGE) ×3 IMPLANT
TOWEL OR 17X26 10 PK STRL BLUE (TOWEL DISPOSABLE) ×3 IMPLANT
TOWEL OR NON WOVEN STRL DISP B (DISPOSABLE) ×3 IMPLANT
TRAY FOLEY CATH 14FRSI W/METER (CATHETERS) ×3 IMPLANT
WATER STERILE IRR 1500ML POUR (IV SOLUTION) ×3 IMPLANT
WRAP KNEE MAXI GEL POST OP (GAUZE/BANDAGES/DRESSINGS) ×3 IMPLANT
YANKAUER SUCT BULB TIP 10FT TU (MISCELLANEOUS) ×3 IMPLANT

## 2014-11-21 NOTE — Transfer of Care (Signed)
Immediate Anesthesia Transfer of Care Note  Patient: [name] [name]  Procedure(s) Performed: Procedure(s) (LRB): LEFT TOTAL KNEE ARTHROPLASTY (Left)  Patient Location: PACU  Anesthesia Type: General  Level of Consciousness: sedated, patient cooperative and responds to stimulation  Airway & Oxygen Therapy: Patient Spontanous Breathing and Patient connected to face mask oxgen  Post-op Assessment: Report given to PACU RN and Post -op Vital signs reviewed and stable  Post vital signs: Reviewed and stable  Complications: No apparent anesthesia complications

## 2014-11-21 NOTE — Plan of Care (Signed)
Problem: Consults Goal: Diagnosis- Total Joint Replacement Primary Total Knee     

## 2014-11-21 NOTE — Anesthesia Preprocedure Evaluation (Addendum)
Anesthesia Evaluation    History of Anesthesia Complications (+) PONV  Airway        Dental   Pulmonary former smoker,          Cardiovascular + Valvular Problems/Murmurs MVP     Neuro/Psych    GI/Hepatic   Endo/Other    Renal/GU      Musculoskeletal   Abdominal   Peds  Hematology   Anesthesia Other Findings   Reproductive/Obstetrics                           Anesthesia Physical Anesthesia Plan  ASA: III  Anesthesia Plan:    Post-op Pain Management:    Induction:   Airway Management Planned:   Additional Equipment:   Intra-op Plan:   Post-operative Plan:   Informed Consent:   Plan Discussed with:   Anesthesia Plan Comments:         Anesthesia Quick Evaluation

## 2014-11-21 NOTE — Op Note (Signed)
Pre-operative diagnosis- Osteoarthritis  Left knee(s)  Post-operative diagnosis- Osteoarthritis Left knee(s)  Procedure-  Left  Total Knee Arthroplasty  Surgeon- Dione Plover. Jaymi Tinner, MD  Assistant- Arlee Muslim, PA-C   Anesthesia-  General  EBL-* No blood loss amount entered *   Drains Hemovac  Tourniquet time-  Total Tourniquet Time Documented: Thigh (Left) - 33 minutes Total: Thigh (Left) - 33 minutes     Complications- None  Condition-PACU - hemodynamically stable.   Brief Clinical Note  [name] [name] is a 64 y.o. year old female with end stage OA of her left knee with progressively worsening pain and dysfunction. She has constant pain, with activity and at rest and significant functional deficits with difficulties even with ADLs. She has had extensive non-op management including analgesics, injections of cortisone and viscosupplements, and home exercise program, but remains in significant pain with significant dysfunction. Radiographs show bone on bone arthritis medial and patellofemoral. She presents now for left Total Knee Arthroplasty.    Procedure in detail---   The patient is brought into the operating room and positioned supine on the operating table. After successful administration of  General,   a tourniquet is placed high on the  Left thigh(s) and the lower extremity is prepped and draped in the usual sterile fashion. Time out is performed by the operating team and then the  Left lower extremity is wrapped in Esmarch, knee flexed and the tourniquet inflated to 300 mmHg.       A midline incision is made with a ten blade through the subcutaneous tissue to the level of the extensor mechanism. A fresh blade is used to make a medial parapatellar arthrotomy. Soft tissue over the proximal medial tibia is subperiosteally elevated to the joint line with a knife and into the semimembranosus bursa with a Cobb elevator. Soft tissue over the proximal lateral tibia is elevated with  attention being paid to avoiding the patellar tendon on the tibial tubercle. The patella is everted, knee flexed 90 degrees and the ACL and PCL are removed. Findings are bone on bone medial and patellofemoral with large medial osteophytes.        The drill is used to create a starting hole in the distal femur and the canal is thoroughly irrigated with sterile saline to remove the fatty contents. The 5 degree Left  valgus alignment guide is placed into the femoral canal and the distal femoral cutting block is pinned to remove 10 mm off the distal femur. Resection is made with an oscillating saw.      The tibia is subluxed forward and the menisci are removed. The extramedullary alignment guide is placed referencing proximally at the medial aspect of the tibial tubercle and distally along the second metatarsal axis and tibial crest. The block is pinned to remove 16mm off the more deficient medial  side. Resection is made with an oscillating saw. Size 5is the most appropriate size for the tibia and the proximal tibia is prepared with the modular drill and keel punch for that size.      The femoral sizing guide is placed and size 5 is most appropriate. Rotation is marked off the epicondylar axis and confirmed by creating a rectangular flexion gap at 90 degrees. The size 5 cutting block is pinned in this rotation and the anterior, posterior and chamfer cuts are made with the oscillating saw. The intercondylar block is then placed and that cut is made.      Trial size 5 tibial component,  trial size 5 posterior stabilized femur and a 8  mm posterior stabilized rotating platform insert trial is placed. Full extension is achieved with excellent varus/valgus and anterior/posterior balance throughout full range of motion. The patella is everted and thickness measured to be 22  mm. Free hand resection is taken to 12 mm, a 38 template is placed, lug holes are drilled, trial patella is placed, and it tracks normally.  Osteophytes are removed off the posterior femur with the trial in place. All trials are removed and the cut bone surfaces prepared with pulsatile lavage. Cement is mixed and once ready for implantation, the size 5 tibial implant, size  5 posterior stabilized femoral component, and the size 38 patella are cemented in place and the patella is held with the clamp. The trial insert is placed and the knee held in full extension. The Exparel (20 ml mixed with 30 ml saline) and .25% Bupivicaine, are injected into the extensor mechanism, posterior capsule, medial and lateral gutters and subcutaneous tissues.  All extruded cement is removed and once the cement is hard the permanent 8 mm posterior stabilized rotating platform insert is placed into the tibial tray.      The wound is copiously irrigated with saline solution and the extensor mechanism closed over a hemovac drain with #1 V-loc suture. The tourniquet is released for a total tourniquet time of 33  minutes. Flexion against gravity is 140 degrees and the patella tracks normally. Subcutaneous tissue is closed with 2.0 vicryl and subcuticular with running 4.0 Monocryl. The incision is cleaned and dried and steri-strips and a bulky sterile dressing are applied. The limb is placed into a knee immobilizer and the patient is awakened and transported to recovery in stable condition.      Please note that a surgical assistant was a medical necessity for this procedure in order to perform it in a safe and expeditious manner. Surgical assistant was necessary to retract the ligaments and vital neurovascular structures to prevent injury to them and also necessary for proper positioning of the limb to allow for anatomic placement of the prosthesis.   Dione Plover Sharif Rendell, MD    11/21/2014, 1:31 PM

## 2014-11-21 NOTE — Anesthesia Procedure Notes (Signed)
Procedure Name: Intubation Date/Time: 11/21/2014 12:30 PM Performed by: Maxwell Caul Pre-anesthesia Checklist: Patient identified, Emergency Drugs available, Suction available and Patient being monitored Patient Re-evaluated:Patient Re-evaluated prior to inductionOxygen Delivery Method: Circle System Utilized Preoxygenation: Pre-oxygenation with 100% oxygen Intubation Type: IV induction Ventilation: Mask ventilation without difficulty Laryngoscope Size: Mac and 4 Grade View: Grade I Tube type: Oral Tube size: 7.5 mm Number of attempts: 1 Airway Equipment and Method: Stylet Placement Confirmation: ETT inserted through vocal cords under direct vision,  positive ETCO2 and breath sounds checked- equal and bilateral Secured at: 21 cm Tube secured with: Tape Dental Injury: Teeth and Oropharynx as per pre-operative assessment

## 2014-11-21 NOTE — Anesthesia Postprocedure Evaluation (Signed)
  Anesthesia Post-op Note  Patient: [name] [name]  Procedure(s) Performed: Procedure(s) (LRB): LEFT TOTAL KNEE ARTHROPLASTY (Left)  Patient Location: PACU  Anesthesia Type: General  Level of Consciousness: awake and alert   Airway and Oxygen Therapy: Patient Spontanous Breathing  Post-op Pain: mild  Post-op Assessment: Post-op Vital signs reviewed, Patient's Cardiovascular Status Stable, Respiratory Function Stable, Patent Airway and No signs of Nausea or vomiting  Last Vitals:  Filed Vitals:   11/21/14 1406  BP: 142/75  Pulse: 72  Temp: 36.4 C  Resp: 20    Post-op Vital Signs: stable   Complications: No apparent anesthesia complications

## 2014-11-21 NOTE — Progress Notes (Signed)
Do not give Atenolol today per Dr. Jillyn Hidden

## 2014-11-22 ENCOUNTER — Encounter (HOSPITAL_COMMUNITY): Payer: Self-pay | Admitting: Orthopedic Surgery

## 2014-11-22 LAB — CBC
HEMATOCRIT: 34.6 % — AB (ref 36.0–46.0)
Hemoglobin: 11.8 g/dL — ABNORMAL LOW (ref 12.0–15.0)
MCH: 32.4 pg (ref 26.0–34.0)
MCHC: 34.1 g/dL (ref 30.0–36.0)
MCV: 95.1 fL (ref 78.0–100.0)
Platelets: 193 10*3/uL (ref 150–400)
RBC: 3.64 MIL/uL — AB (ref 3.87–5.11)
RDW: 12.8 % (ref 11.5–15.5)
WBC: 9.8 10*3/uL (ref 4.0–10.5)

## 2014-11-22 LAB — BASIC METABOLIC PANEL
Anion gap: 5 (ref 5–15)
BUN: 12 mg/dL (ref 6–23)
CALCIUM: 9 mg/dL (ref 8.4–10.5)
CO2: 27 mmol/L (ref 19–32)
CREATININE: 0.68 mg/dL (ref 0.50–1.10)
Chloride: 106 mmol/L (ref 96–112)
GFR calc Af Amer: >90 mL/min (ref >90)
GFR calc non Af Amer: >90 mL/min (ref >90)
Glucose, Bld: 127 mg/dL — ABNORMAL HIGH (ref 70–99)
Potassium: 4.1 mmol/L (ref 3.5–5.1)
Sodium: 138 mmol/L (ref 135–145)

## 2014-11-22 MED ORDER — ATENOLOL 12.5 MG HALF TABLET
12.5000 mg | ORAL_TABLET | Freq: Every day | ORAL | Status: DC | PRN
  Filled 2014-11-22: qty 1

## 2014-11-22 MED ORDER — ESOMEPRAZOLE MAGNESIUM 40 MG PO CPDR
40.0000 mg | DELAYED_RELEASE_CAPSULE | Freq: Every day | ORAL | Status: DC | PRN
  Filled 2014-11-22: qty 1

## 2014-11-22 MED ORDER — NON FORMULARY: 40.0000 mg | Freq: Every day | Status: DC | PRN

## 2014-11-22 MED ORDER — RIVAROXABAN 10 MG PO TABS: 10.0000 mg | ORAL_TABLET | Freq: Every day | ORAL | Status: DC

## 2014-11-22 MED ORDER — METHOCARBAMOL 500 MG PO TABS: 500.0000 mg | ORAL_TABLET | Freq: Four times a day (QID) | ORAL | Status: DC | PRN

## 2014-11-22 MED ORDER — OXYCODONE HCL 5 MG PO TABS: 5.0000 mg | ORAL_TABLET | ORAL | Status: DC | PRN

## 2014-11-22 MED ORDER — SODIUM CHLORIDE 0.9 % IV BOLUS (SEPSIS)
250.0000 mL | Freq: Once | INTRAVENOUS | Status: AC
  Administered 2014-11-22: 250 mL via INTRAVENOUS

## 2014-11-22 MED ORDER — TRAMADOL HCL 50 MG PO TABS: 50.0000 mg | ORAL_TABLET | Freq: Four times a day (QID) | ORAL | Status: DC | PRN

## 2014-11-22 NOTE — Discharge Summary (Signed)
Physician Discharge Summary   Patient ID: WILEY FLICKER MRN: [id number] DOB/AGE: May 10, 1951 64 y.o.  Admit date: 11/21/2014 Discharge date: 11/23/2014  Primary Diagnosis:  Osteoarthritis Left knee(s)  Admission Diagnoses:  Past Medical History  Diagnosis Date  . Melanoma of skin   . Basal cell cancer 5/08  . Hyperlipidemia DIET CONTROL  . GERD (gastroesophageal reflux disease)     HH  . Eczema   . Chest pain 9/11    [facility]) and palpitations - ruled out for MI with nl. Echo  . Heart palpitations OCCASIONAL--  TAKES ATENOLOL PRN  . Normal nuclear stress test 07-09-10  . Arthritis     NECK  . Complication of anesthesia HARD TO WAKE  . Hiatal hernia   . Osteopenia 12/2013    T score -1.6 FRAX 16%/0.8%  . HSV-2 infection     buttocks  . Breast cyst   . PONV (postoperative nausea and vomiting)   . MVP (mitral valve prolapse)     per pt no treatment needed  . Difficulty sleeping   . Acute meniscal tear of knee    Discharge Diagnoses:   Active Problems:   OA (osteoarthritis) of knee  Estimated body mass index is 29.07 kg/(m^2) as calculated from the following:   Height as of this encounter: 5' 6"  (1.676 m).   Weight as of this encounter: 81.647 kg (180 lb).  Procedure:  Procedure(s) (LRB): LEFT TOTAL KNEE ARTHROPLASTY (Left)   Consults: None  HPI: [name] [name] is a 64 y.o. year old female with end stage OA of her left knee with progressively worsening pain and dysfunction. She has constant pain, with activity and at rest and significant functional deficits with difficulties even with ADLs. She has had extensive non-op management including analgesics, injections of cortisone and viscosupplements, and home exercise program, but remains in significant pain with significant dysfunction. Radiographs show bone on bone arthritis medial and patellofemoral. She presents now for left Total Knee Arthroplasty.   Laboratory Data: Admission on 11/21/2014, Discharged on 11/23/2014   Component Date Value Ref Range Status  . ABO/RH(D) 11/21/2014 O NEG   Final  . Antibody Screen 11/21/2014 NEG   Final  . Sample Expiration 11/21/2014 11/24/2014   Final  . WBC 11/22/2014 9.8  4.0 - 10.5 K/uL Final  . RBC 11/22/2014 3.64* 3.87 - 5.11 MIL/uL Final  . Hemoglobin 11/22/2014 11.8* 12.0 - 15.0 g/dL Final  . HCT 11/22/2014 34.6* 36.0 - 46.0 % Final  . MCV 11/22/2014 95.1  78.0 - 100.0 fL Final  . MCH 11/22/2014 32.4  26.0 - 34.0 pg Final  . MCHC 11/22/2014 34.1  30.0 - 36.0 g/dL Final  . RDW 11/22/2014 12.8  11.5 - 15.5 % Final  . Platelets 11/22/2014 193  150 - 400 K/uL Final  . Sodium 11/22/2014 138  135 - 145 mmol/L Final  . Potassium 11/22/2014 4.1  3.5 - 5.1 mmol/L Final  . Chloride 11/22/2014 106  96 - 112 mmol/L Final  . CO2 11/22/2014 27  19 - 32 mmol/L Final  . Glucose, Bld 11/22/2014 127* 70 - 99 mg/dL Final  . BUN 11/22/2014 12  6 - 23 mg/dL Final  . Creatinine, Ser 11/22/2014 0.68  0.50 - 1.10 mg/dL Final  . Calcium 11/22/2014 9.0  8.4 - 10.5 mg/dL Final  . GFR calc non Af Amer 11/22/2014 >90  >90 mL/min Final  . GFR calc Af Amer 11/22/2014 >90  >90 mL/min Final   Comment: (NOTE) The eGFR  has been calculated using the CKD EPI equation. This calculation has not been validated in all clinical situations. eGFR's persistently <90 mL/min signify possible Chronic Kidney Disease.   . Anion gap 11/22/2014 5  5 - 15 Final  . WBC 11/23/2014 9.1  4.0 - 10.5 K/uL Final  . RBC 11/23/2014 3.39* 3.87 - 5.11 MIL/uL Final  . Hemoglobin 11/23/2014 11.1* 12.0 - 15.0 g/dL Final  . HCT 11/23/2014 32.8* 36.0 - 46.0 % Final  . MCV 11/23/2014 96.8  78.0 - 100.0 fL Final  . MCH 11/23/2014 32.7  26.0 - 34.0 pg Final  . MCHC 11/23/2014 33.8  30.0 - 36.0 g/dL Final  . RDW 11/23/2014 13.2  11.5 - 15.5 % Final  . Platelets 11/23/2014 198  150 - 400 K/uL Final  . Sodium 11/23/2014 144  135 - 145 mmol/L Final  . Potassium 11/23/2014 3.6  3.5 - 5.1 mmol/L Final  . Chloride 11/23/2014  108  96 - 112 mmol/L Final  . CO2 11/23/2014 27  19 - 32 mmol/L Final  . Glucose, Bld 11/23/2014 105* 70 - 99 mg/dL Final  . BUN 11/23/2014 13  6 - 23 mg/dL Final  . Creatinine, Ser 11/23/2014 0.70  0.50 - 1.10 mg/dL Final  . Calcium 11/23/2014 9.2  8.4 - 10.5 mg/dL Final  . GFR calc non Af Amer 11/23/2014 >90  >90 mL/min Final  . GFR calc Af Amer 11/23/2014 >90  >90 mL/min Final   Comment: (NOTE) The eGFR has been calculated using the CKD EPI equation. This calculation has not been validated in all clinical situations. eGFR's persistently <90 mL/min signify possible Chronic Kidney Disease.   . Anion gap 11/23/2014 9  5 - 15 Final  Orders Only on 11/21/2014  Component Date Value Ref Range Status  . ABO/RH(D) 11/21/2014 O NEG   Final  Hospital Outpatient Visit on 11/14/2014  Component Date Value Ref Range Status  . MRSA, PCR 11/14/2014 NEGATIVE  NEGATIVE Final  . Staphylococcus aureus 11/14/2014 NEGATIVE  NEGATIVE Final   Comment:        The Xpert SA Assay (FDA approved for NASAL specimens in patients over 88 years of age), is one component of a comprehensive surveillance program.  Test performance has been validated by [facility] for patients greater than or equal to 60 year old. It is not intended to diagnose infection nor to guide or monitor treatment.   Marland Kitchen aPTT 11/14/2014 39* 24 - 37 seconds Final   Comment:        IF BASELINE aPTT IS ELEVATED, SUGGEST PATIENT RISK ASSESSMENT BE USED TO DETERMINE APPROPRIATE ANTICOAGULANT THERAPY.   . WBC 11/14/2014 5.3  4.0 - 10.5 K/uL Final  . RBC 11/14/2014 4.59  3.87 - 5.11 MIL/uL Final  . Hemoglobin 11/14/2014 15.2* 12.0 - 15.0 g/dL Final  . HCT 11/14/2014 43.8  36.0 - 46.0 % Final  . MCV 11/14/2014 95.4  78.0 - 100.0 fL Final  . MCH 11/14/2014 33.1  26.0 - 34.0 pg Final  . MCHC 11/14/2014 34.7  30.0 - 36.0 g/dL Final  . RDW 11/14/2014 12.7  11.5 - 15.5 % Final  . Platelets 11/14/2014 200  150 - 400 K/uL Final  . Sodium  11/14/2014 141  135 - 145 mmol/L Final  . Potassium 11/14/2014 4.0  3.5 - 5.1 mmol/L Final  . Chloride 11/14/2014 104  96 - 112 mmol/L Final  . CO2 11/14/2014 31  19 - 32 mmol/L Final  . Glucose, Bld 11/14/2014 63*  70 - 99 mg/dL Final  . BUN 11/14/2014 14  6 - 23 mg/dL Final  . Creatinine, Ser 11/14/2014 0.81  0.50 - 1.10 mg/dL Final  . Calcium 11/14/2014 10.0  8.4 - 10.5 mg/dL Final  . Total Protein 11/14/2014 7.2  6.0 - 8.3 g/dL Final  . Albumin 11/14/2014 4.3  3.5 - 5.2 g/dL Final  . AST 11/14/2014 24  0 - 37 U/L Final  . ALT 11/14/2014 22  0 - 35 U/L Final  . Alkaline Phosphatase 11/14/2014 74  39 - 117 U/L Final  . Total Bilirubin 11/14/2014 0.7  0.3 - 1.2 mg/dL Final  . GFR calc non Af Amer 11/14/2014 76* >90 mL/min Final  . GFR calc Af Amer 11/14/2014 88* >90 mL/min Final   Comment: (NOTE) The eGFR has been calculated using the CKD EPI equation. This calculation has not been validated in all clinical situations. eGFR's persistently <90 mL/min signify possible Chronic Kidney Disease.   . Anion gap 11/14/2014 6  5 - 15 Final  . Prothrombin Time 11/14/2014 14.0  11.6 - 15.2 seconds Final  . INR 11/14/2014 1.06  0.00 - 1.49 Final  . Color, Urine 11/14/2014 YELLOW  YELLOW Final  . APPearance 11/14/2014 CLEAR  CLEAR Final  . Specific Gravity, Urine 11/14/2014 1.007  1.005 - 1.030 Final  . pH 11/14/2014 6.5  5.0 - 8.0 Final  . Glucose, UA 11/14/2014 NEGATIVE  NEGATIVE mg/dL Final  . Hgb urine dipstick 11/14/2014 NEGATIVE  NEGATIVE Final  . Bilirubin Urine 11/14/2014 NEGATIVE  NEGATIVE Final  . Ketones, ur 11/14/2014 NEGATIVE  NEGATIVE mg/dL Final  . Protein, ur 11/14/2014 NEGATIVE  NEGATIVE mg/dL Final  . Urobilinogen, UA 11/14/2014 0.2  0.0 - 1.0 mg/dL Final  . Nitrite 11/14/2014 NEGATIVE  NEGATIVE Final  . Leukocytes, UA 11/14/2014 SMALL* NEGATIVE Final  . Squamous Epithelial / LPF 11/14/2014 RARE  RARE Final  . WBC, UA 11/14/2014 0-2  <3 WBC/hpf Final     X-Rays:No results  found.  EKG: Orders placed or performed in visit on 10/21/14  . EKG 12-Lead     Hospital Course: [name] [name] is a 64 y.o. who was admitted to [facility]. They were brought to the operating room on 11/21/2014 and underwent Procedure(s): LEFT TOTAL KNEE ARTHROPLASTY.  Patient tolerated the procedure well and was later transferred to the recovery room and then to the orthopaedic floor for postoperative care.  They were given PO and IV analgesics for pain control following their surgery.  They were given 24 hours of postoperative antibiotics of  Anti-infectives    Start     Dose/Rate Route Frequency Ordered Stop   11/21/14 1830  ceFAZolin (ANCEF) IVPB 2 g/50 mL premix     2 g 100 mL/hr over 30 Minutes Intravenous Every 6 hours 11/21/14 1540 11/22/14 0024   11/21/14 0918  ceFAZolin (ANCEF) IVPB 2 g/50 mL premix     2 g 100 mL/hr over 30 Minutes Intravenous On call to O.R. 11/21/14 4709 11/21/14 1231     and started on DVT prophylaxis in the form of Xarelto.   PT and OT were ordered for total joint protocol.  Discharge planning consulted to help with postop disposition and equipment needs.  Patient had a tough night on the evening of surgery and had low pressures.  They started to get up OOB with therapy on day one  Hemovac drain was pulled without difficulty.  Continued to work with therapy into day two.  Dressing  was changed on day two and the incision was healing well.  Patient was seen in rounds and was ready to go home later after therapy.  Discharge home with home health Diet - Cardiac diet Follow up - in 2 weeks Activity - WBAT Disposition - Home Condition Upon Discharge - Good D/C Meds - See DC Summary DVT Prophylaxis - Xarelto      Discharge Instructions    Call MD / Call 911    Complete by:  As directed   If you experience chest pain or shortness of breath, CALL 911 and be transported to the hospital emergency room.  If you develope a fever above 101 F, pus (white  drainage) or increased drainage or redness at the wound, or calf pain, call your surgeon's office.     Change dressing    Complete by:  As directed   Change dressing daily with sterile 4 x 4 inch gauze dressing and apply TED hose. Do not submerge the incision under water.     Constipation Prevention    Complete by:  As directed   Drink plenty of fluids.  Prune juice may be helpful.  You may use a stool softener, such as Colace (over the counter) 100 mg twice a day.  Use MiraLax (over the counter) for constipation as needed.     Diet - low sodium heart healthy    Complete by:  As directed      Discharge instructions    Complete by:  As directed   Pick up stool softner and laxative for home use following surgery while on pain medications. Do not submerge incision under water. Please use good hand washing techniques while changing dressing each day. May shower starting three days after surgery. Please use a clean towel to pat the incision dry following showers. Continue to use ice for pain and swelling after surgery. Do not use any lotions or creams on the incision until instructed by your surgeon.  Take Xarelto for two and a half more weeks, then discontinue Xarelto. Once the patient has completed the Xarelto, they may resume the 81 mg Aspirin.  Postoperative Constipation Protocol  Constipation - defined medically as fewer than three stools per week and severe constipation as less than one stool per week.  One of the most common issues patients have following surgery is constipation.  Even if you have a regular bowel pattern at home, your normal regimen is likely to be disrupted due to multiple reasons following surgery.  Combination of anesthesia, postoperative narcotics, change in appetite and fluid intake all can affect your bowels.  In order to avoid complications following surgery, here are some recommendations in order to help you during your recovery period.  Colace (docusate) - Pick  up an over-the-counter form of Colace or another stool softener and take twice a day as long as you are requiring postoperative pain medications.  Take with a full glass of water daily.  If you experience loose stools or diarrhea, hold the colace until you stool forms back up.  If your symptoms do not get better within 1 week or if they get worse, check with your doctor.  Dulcolax (bisacodyl) - Pick up over-the-counter and take as directed by the product packaging as needed to assist with the movement of your bowels.  Take with a full glass of water.  Use this product as needed if not relieved by Colace only.   MiraLax (polyethylene glycol) - Pick up over-the-counter to have on  hand.  MiraLax is a solution that will increase the amount of water in your bowels to assist with bowel movements.  Take as directed and can mix with a glass of water, juice, soda, coffee, or tea.  Take if you go more than two days without a movement. Do not use MiraLax more than once per day. Call your doctor if you are still constipated or irregular after using this medication for 7 days in a row.  If you continue to have problems with postoperative constipation, please contact the office for further assistance and recommendations.  If you experience "the worst abdominal pain ever" or develop nausea or vomiting, please contact the office immediatly for further recommendations for treatment.     Do not put a pillow under the knee. Place it under the heel.    Complete by:  As directed      Do not sit on low chairs, stoools or toilet seats, as it may be difficult to get up from low surfaces    Complete by:  As directed      Driving restrictions    Complete by:  As directed   No driving until released by the physician.     Increase activity slowly as tolerated    Complete by:  As directed      Lifting restrictions    Complete by:  As directed   No lifting until released by the physician.     Patient may shower    Complete  by:  As directed   You may shower without a dressing once there is no drainage.  Do not wash over the wound.  If drainage remains, do not shower until drainage stops.     TED hose    Complete by:  As directed   Use stockings (TED hose) for 3 weeks on both leg(s).  You may remove them at night for sleeping.     Weight bearing as tolerated    Complete by:  As directed   Laterality:  left  Extremity:  Lower            Medication List    STOP taking these medications        aspirin 81 MG tablet     BIOTIN PO     calcium carbonate 600 MG Tabs tablet  Commonly known as:  OS-CAL     CINNAMON PO     Cranberry 1000 MG Caps     diclofenac sodium 1 % Gel  Commonly known as:  VOLTAREN     esomeprazole 40 MG capsule  Commonly known as:  NEXIUM     multivitamin tablet     VITAMIN B-12 PO     Vitamin D 1000 UNITS capsule      TAKE these medications        acetaminophen 325 MG tablet  Commonly known as:  TYLENOL  Take 650 mg by mouth every 6 (six) hours as needed for mild pain.     acyclovir 200 MG capsule  Commonly known as:  ZOVIRAX  Take 1 capsule (200 mg total) by mouth as needed. Take 200 mg by mouth twice weekly as needed.     acyclovir cream 5 %  Commonly known as:  ZOVIRAX  Apply daily as needed     atenolol 25 MG tablet  Commonly known as:  TENORMIN  TAKE 1/2 TABLET BY MOUTH DAILY     diphenhydrAMINE 25 mg capsule  Commonly known as:  BENADRYL  Take  25 mg by mouth every 6 (six) hours as needed for sleep.     hydrocortisone 2.5 % rectal cream  Commonly known as:  ANUSOL-HC  Place 1 application rectally as needed. Apply to affected area once daily as needed for hemorrhoids.     magnesium gluconate 500 MG tablet  Commonly known as:  MAGONATE  Take 500 mg by mouth daily.     methocarbamol 500 MG tablet  Commonly known as:  ROBAXIN  Take 1 tablet (500 mg total) by mouth every 6 (six) hours as needed for muscle spasms.     oxyCODONE 5 MG immediate  release tablet  Commonly known as:  Oxy IR/ROXICODONE  Take 1-2 tablets (5-10 mg total) by mouth every 3 (three) hours as needed for moderate pain, severe pain or breakthrough pain.     rivaroxaban 10 MG Tabs tablet  Commonly known as:  XARELTO  - Take 1 tablet (10 mg total) by mouth daily with breakfast. Take Xarelto for two and a half more weeks, then discontinue Xarelto.  - Once the patient has completed the Xarelto, they may resume the 81 mg Aspirin.     SYSTANE OP  Apply 1 drop to eye daily as needed (Dry eyes).     traMADol 50 MG tablet  Commonly known as:  ULTRAM  Take 1-2 tablets (50-100 mg total) by mouth every 6 (six) hours as needed (mild pain).       Follow-up Information    Follow up with Gearlean Alf, MD On 12/06/2014.   Specialty:  Orthopedic Surgery   Why:  Call office at [phone] to set up appointment with Dr. Wynelle Link.   Contact information:   [address] [place] [address] [phone]       Follow up with [facility].   Why:  home health physical therapy   Contact information:   [address] [place] [address] [phone]       Follow up with [facility].   Why:  3n1 (commode) and rolling walker   Contact information:   [place] [address] [phone]       Signed: Arlee Muslim, PA-C Orthopaedic Surgery 12/07/2014, 8:41 PM

## 2014-11-22 NOTE — Progress Notes (Signed)
   Subjective: 1 Day Post-Op Procedure(s) (LRB): LEFT TOTAL KNEE ARTHROPLASTY (Left) Patient reports pain as mild and moderate.   Patient seen in rounds with Dr. Wynelle Link. Patient is well, but has had some minor complaints of pain in the knee, requiring pain medications We will start therapy today. Had some low pressures last night so will give fluids this morning. Plan is to go Home after hospital stay.  Objective: Vital signs in last 24 hours: Temp:  [97.6 F (36.4 C)-98.2 F (36.8 C)] 98.2 F (36.8 C) (02/16 0606) Pulse Rate:  [54-93] 64 (02/16 0606) Resp:  [15-22] 16 (02/16 0606) BP: (95-145)/(59-86) 95/59 mmHg (02/16 0606) SpO2:  [94 %-100 %] 98 % (02/16 0606) Weight:  [81.647 kg (180 lb)] 81.647 kg (180 lb) (02/15 0953)  Intake/Output from previous day:  Intake/Output Summary (Last 24 hours) at 11/22/14 0817 Last data filed at 11/22/14 0814  Gross per 24 hour  Intake 3556.67 ml  Output   1895 ml  Net 1661.67 ml    Intake/Output this shift: Total I/O In: 240 [P.O.:240] Out: -   Labs:  Recent Labs  11/22/14 0445  HGB 11.8*    Recent Labs  11/22/14 0445  WBC 9.8  RBC 3.64*  HCT 34.6*  PLT 193    Recent Labs  11/22/14 0445  NA 138  K 4.1  CL 106  CO2 27  BUN 12  CREATININE 0.68  GLUCOSE 127*  CALCIUM 9.0   No results for input(s): LABPT, INR in the last 72 hours.  EXAM General - Patient is Alert, Appropriate and Oriented Extremity - Neurovascular intact Sensation intact distally Dorsiflexion/Plantar flexion intact Dressing - dressing C/D/I Motor Function - intact, moving foot and toes well on exam.  Hemovac pulled without difficulty.  Past Medical History  Diagnosis Date  . Melanoma of skin   . Basal cell cancer 5/08  . Hyperlipidemia DIET CONTROL  . GERD (gastroesophageal reflux disease)     HH  . Eczema   . Chest pain 9/11    [facility]) and palpitations - ruled out for MI with nl. Echo  . Heart palpitations OCCASIONAL--  TAKES  ATENOLOL PRN  . Normal nuclear stress test 07-09-10  . Arthritis     NECK  . Complication of anesthesia HARD TO WAKE  . Hiatal hernia   . Osteopenia 12/2013    T score -1.6 FRAX 16%/0.8%  . HSV-2 infection     buttocks  . Breast cyst   . PONV (postoperative nausea and vomiting)   . MVP (mitral valve prolapse)     per pt no treatment needed  . Difficulty sleeping   . Acute meniscal tear of knee     Assessment/Plan: 1 Day Post-Op Procedure(s) (LRB): LEFT TOTAL KNEE ARTHROPLASTY (Left) Active Problems:   OA (osteoarthritis) of knee  Estimated body mass index is 29.07 kg/(m^2) as calculated from the following:   Height as of this encounter: 5\' 6"  (1.676 m).   Weight as of this encounter: 81.647 kg (180 lb). Advance diet Up with therapy Plan for discharge tomorrow Discharge home with home health  DVT Prophylaxis - Xarelto Weight-Bearing as tolerated to left leg D/C O2 and Pulse OX and try on Room Air  Arlee Muslim, PA-C Orthopaedic Surgery 11/22/2014, 8:17 AM

## 2014-11-22 NOTE — Discharge Instructions (Addendum)
° °Dr. Frank Aluisio °Total Joint Specialist °[facility] °[address] °[place], [place] [address] °[phone] ° °TOTAL KNEE REPLACEMENT POSTOPERATIVE DIRECTIONS ° ° ° °Knee Rehabilitation, Guidelines Following Surgery  °Results after knee surgery are often greatly improved when you follow the exercise, range of motion and muscle strengthening exercises prescribed by your doctor. Safety measures are also important to protect the knee from further injury. Any time any of these exercises cause you to have increased pain or swelling in your knee joint, decrease the amount until you are comfortable again and slowly increase them. If you have problems or questions, call your caregiver or physical therapist for advice.  ° °HOME CARE INSTRUCTIONS  °Remove items at home which could result in a fall. This includes throw rugs or furniture in walking pathways.  °Continue medications as instructed at time of discharge. °You may have some home medications which will be placed on hold until you complete the course of blood thinner medication.  °You may start showering once you are discharged home but do not submerge the incision under water. Just pat the incision dry and apply a dry gauze dressing on daily. °Walk with walker as instructed.  °You may resume a sexual relationship in one month or when given the OK by  your doctor.  °· Use walker as long as suggested by your caregivers. °· Avoid periods of inactivity such as sitting longer than an hour when not asleep. This helps prevent blood clots.  °You may put full weight on your legs and walk as much as is comfortable.  °You may return to work once you are cleared by your doctor.  °Do not drive a car for 6 weeks or until released by you surgeon.  °· Do not drive while taking narcotics.  °Wear the elastic stockings for three weeks following surgery during the day but you may remove then at night. °Make sure you keep all of your appointments after your  operation with all of your doctors and caregivers. You should call the office at the above phone number and make an appointment for approximately two weeks after the date of your surgery. °Change the dressing daily and reapply a dry dressing each time. °Please pick up a stool softener and laxative for home use as long as you are requiring pain medications. °· ICE to the affected knee every three hours for 30 minutes at a time and then as needed for pain and swelling.  Continue to use ice on the knee for pain and swelling from surgery. You may notice swelling that will progress down to the foot and ankle.  This is normal after surgery.  Elevate the leg when you are not up walking on it.   °It is important for you to complete the blood thinner medication as prescribed by your doctor. °· Continue to use the breathing machine which will help keep your temperature down.  It is common for your temperature to cycle up and down following surgery, especially at night when you are not up moving around and exerting yourself.  The breathing machine keeps your lungs expanded and your temperature down. ° °RANGE OF MOTION AND STRENGTHENING EXERCISES  °Rehabilitation of the knee is important following a knee injury or an operation. After just a few days of immobilization, the muscles of the thigh which control the knee become weakened and shrink (atrophy). Knee exercises are designed to build up the tone and strength of the thigh muscles and to improve knee   motion. Often times heat used for twenty to thirty minutes before working out will loosen up your tissues and help with improving the range of motion but do not use heat for the first two weeks following surgery. These exercises can be done on a training (exercise) mat, on the floor, on a table or on a bed. Use what ever works the best and is most comfortable for you Knee exercises include:  °Leg Lifts - While your knee is still immobilized in a splint or cast, you can do  straight leg raises. Lift the leg to 60 degrees, hold for 3 sec, and slowly lower the leg. Repeat 10-20 times 2-3 times daily. Perform this exercise against resistance later as your knee gets better.  °Quad and Hamstring Sets - Tighten up the muscle on the front of the thigh (Quad) and hold for 5-10 sec. Repeat this 10-20 times hourly. Hamstring sets are done by pushing the foot backward against an object and holding for 5-10 sec. Repeat as with quad sets.  °A rehabilitation program following serious knee injuries can speed recovery and prevent re-injury in the future due to weakened muscles. Contact your doctor or a physical therapist for more information on knee rehabilitation.  ° °SKILLED REHAB INSTRUCTIONS: °If the patient is transferred to a skilled rehab facility following release from the hospital, a list of the current medications will be sent to the facility for the patient to continue.  When discharged from the skilled rehab facility, please have the facility set up the patient's Home Health Physical Therapy prior to being released. Also, the skilled facility will be responsible for providing the patient with their medications at time of release from the facility to include their pain medication, the muscle relaxants, and their blood thinner medication. If the patient is still at the rehab facility at time of the two week follow up appointment, the skilled rehab facility will also need to assist the patient in arranging follow up appointment in our office and any transportation needs. ° °MAKE SURE YOU:  °Understand these instructions.  °Will watch your condition.  °Will get help right away if you are not doing well or get worse.  ° ° °Pick up stool softner and laxative for home use following surgery while on pain medications. °Do not submerge incision under water. °Please use good hand washing techniques while changing dressing each day. °May shower starting three days after surgery. °Please use a clean  towel to pat the incision dry following showers. °Continue to use ice for pain and swelling after surgery. °Do not use any lotions or creams on the incision until instructed by your surgeon. ° °Take Xarelto for two and a half more weeks, then discontinue Xarelto. °Once the patient has completed the Xarelto, they may resume the 81 mg Aspirin. ° °Postoperative Constipation Protocol ° °Constipation - defined medically as fewer than three stools per week and severe constipation as less than one stool per week. ° °One of the most common issues patients have following surgery is constipation.  Even if you have a regular bowel pattern at home, your normal regimen is likely to be disrupted due to multiple reasons following surgery.  Combination of anesthesia, postoperative narcotics, change in appetite and fluid intake all can affect your bowels.  In order to avoid complications following surgery, here are some recommendations in order to help you during your recovery period. ° °Colace (docusate) - Pick up an over-the-counter form of Colace or another stool softener and take   twice a day as long as you are requiring postoperative pain medications.  Take with a full glass of water daily.  If you experience loose stools or diarrhea, hold the colace until you stool forms back up.  If your symptoms do not get better within 1 week or if they get worse, check with your doctor.  Dulcolax (bisacodyl) - Pick up over-the-counter and take as directed by the product packaging as needed to assist with the movement of your bowels.  Take with a full glass of water.  Use this product as needed if not relieved by Colace only.   MiraLax (polyethylene glycol) - Pick up over-the-counter to have on hand.  MiraLax is a solution that will increase the amount of water in your bowels to assist with bowel movements.  Take as directed and can mix with a glass of water, juice, soda, coffee, or tea.  Take if you go more than two days without a  movement. Do not use MiraLax more than once per day. Call your doctor if you are still constipated or irregular after using this medication for 7 days in a row.  If you continue to have problems with postoperative constipation, please contact the office for further assistance and recommendations.  If you experience "the worst abdominal pain ever" or develop nausea or vomiting, please contact the office immediatly for further recommendations for treatment.    Information on my medicine - XARELTO (Rivaroxaban)  This medication education was reviewed with me or my healthcare representative as part of my discharge preparation.  The pharmacist that spoke with me during my hospital stay was:  Luiz Ochoa [facility]  Why was Xarelto prescribed for you? Xarelto was prescribed for you to reduce the risk of blood clots forming after orthopedic surgery. The medical term for these abnormal blood clots is venous thromboembolism (VTE).  What do you need to know about xarelto ? Take your Xarelto ONCE DAILY at the same time every day. You may take it either with or without food.  If you have difficulty swallowing the tablet whole, you may crush it and mix in applesauce just prior to taking your dose.  Take Xarelto exactly as prescribed by your doctor and DO NOT stop taking Xarelto without talking to the doctor who prescribed the medication.  Stopping without other VTE prevention medication to take the place of Xarelto may increase your risk of developing a clot.  After discharge, you should have regular check-up appointments with your healthcare provider that is prescribing your Xarelto.    What do you do if you miss a dose? If you miss a dose, take it as soon as you remember on the same day then continue your regularly scheduled once daily regimen the next day. Do not take two doses of Xarelto on the same day.   Important Safety Information A possible side effect of Xarelto is bleeding. You  should call your healthcare provider right away if you experience any of the following: ? Bleeding from an injury or your nose that does not stop. ? Unusual colored urine (red or dark brown) or unusual colored stools (red or black). ? Unusual bruising for unknown reasons. ? A serious fall or if you hit your head (even if there is no bleeding).  Some medicines may interact with Xarelto and might increase your risk of bleeding while on Xarelto. To help avoid this, consult your healthcare provider or pharmacist prior to using any new prescription or non-prescription medications, including herbals,  vitamins, non-steroidal anti-inflammatory drugs (NSAIDs) and supplements.  This website has more information on Xarelto: [email or web address]

## 2014-11-22 NOTE — Evaluation (Signed)
Occupational Therapy Evaluation Patient Details Name: [name] [name] MRN: [id number] DOB: 1951-04-02 Today's Date: 11/22/2014    History of Present Illness LTKA   Clinical Impression   Pt doing well. She is overall at min to mod assist with ADL. She is checking to see if she can borrow at [place]. Will follow to progress ADL independence.     Follow Up Recommendations  No OT follow up;Supervision/Assistance - 24 hour    Equipment Recommendations  3 in 1 bedside comode (if pt is not able to borrow one)    Recommendations for Other Services       Precautions / Restrictions Precautions Precautions: Knee;Fall Required Braces or Orthoses:  (per PT, did SLR) Knee Immobilizer - Left: Discontinue once straight leg raise with < 10 degree lag Restrictions Weight Bearing Restrictions: No      Mobility Bed Mobility Overal bed mobility: Needs Assistance Bed Mobility: Supine to Sit     Supine to sit: Min guard   General bed mobility comments: cues on technique.  Transfers Overall transfer level: Needs assistance Equipment used: Rolling walker (2 wheeled) Transfers: Sit to/from Stand Sit to Stand: Min guard         General transfer comment: verbal cues hand placement and LE management.    Balance                                            ADL Overall ADL's : Needs assistance/impaired Eating/Feeding: Independent;Sitting   Grooming: Wash/dry hands;Minimal assistance;Standing   Upper Body Bathing: Set up;Sitting   Lower Body Bathing: Minimal assistance;Sit to/from stand   Upper Body Dressing : Set up;Sitting   Lower Body Dressing: Moderate assistance;Sit to/from stand   Toilet Transfer: Minimal assistance;Ambulation;Comfort height toilet;Grab bars;RW   Toileting- Clothing Manipulation and Hygiene: Minimal assistance;Sit to/from stand         General ADL Comments: Discussed options of AE as pt has a reacher so demonstrated how pt can use  reacher to don pants. Also educated on other AE but appears pt is leaning toward having spouse assist until she is able. Encouraged pt to keep trying to reach down to L foot to don clothing to work toward increased flexibility in knee. Pt did well with grab bar and comfort height commode but she does have concerns if her counter will provide enough support to stand/sit. She is checking to see if she can borrow a 3in1. Min cues for safety with walker at the sink to avoid twisting on her knee and for balance.      Vision     Perception     Praxis      Pertinent Vitals/Pain Pain Assessment: 0-10 Pain Score: 5  Pain Location: thigh Pain Descriptors / Indicators: Sore Pain Intervention(s): Repositioned;Ice applied     Hand Dominance     Extremity/Trunk Assessment Upper Extremity Assessment Upper Extremity Assessment: Overall WFL for tasks assessed          Communication Communication Communication: No difficulties   Cognition Arousal/Alertness: Awake/alert Behavior During Therapy: WFL for tasks assessed/performed Overall Cognitive Status: Within Functional Limits for tasks assessed                     General Comments       Exercises       Shoulder Instructions      Home Living Family/patient expects  to be discharged to:: Private residence Living Arrangements: Spouse/significant other Available Help at Discharge: Family Type of Home: House Home Access: Stairs to enter Technical brewer of Steps: 3 Entrance Stairs-Rails: Right;Left;None Home Layout: One level     Bathroom Shower/Tub: Teacher, early years/pre:  (comfort height commode)     Home Equipment: Financial controller: Reacher Additional Comments: has to check on RW; also checking on 3in1      Prior Functioning/Environment Level of Independence: Independent             OT Diagnosis: Generalized weakness   OT Problem List: Decreased strength;Decreased  knowledge of use of DME or AE   OT Treatment/Interventions: Self-care/ADL training;Patient/family education;Therapeutic activities;DME and/or AE instruction    OT Goals(Current goals can be found in the care plan section) Acute Rehab OT Goals Patient Stated Goal: decrease pain OT Goal Formulation: With patient Time For Goal Achievement: 11/29/14 Potential to Achieve Goals: Good  OT Frequency: Min 2X/week   Barriers to D/C:            Co-evaluation              End of Session Equipment Utilized During Treatment: Rolling walker  Activity Tolerance: Patient tolerated treatment well Patient left: in chair;with call bell/phone within reach   Time: 1335-1400 OT Time Calculation (min): 25 min Charges:  OT General Charges $OT Visit: 1 Procedure OT Evaluation $Initial OT Evaluation Tier I: 1 Procedure OT Treatments $Therapeutic Activity: 8-22 mins G-Codes:    Jules Schick  [phone] 11/22/2014, 2:15 PM

## 2014-11-22 NOTE — Care Management Note (Signed)
CARE MANAGEMENT NOTE 11/22/2014  Patient:  ROMELL, CAVANAH   Account Number:  [id number]  Date Initiated:  11/22/2014  Documentation initiated by:  Rozina Pointer  Subjective/Objective Assessment:   Left  Total Knee Arthroplasty     Action/Plan:   home when stable   Anticipated DC Date:  11/23/2014   Anticipated DC Plan:  [place] referral  NA      DC Planning Services  CM consult      [facility] Choice  NA   Choice offered to / List presented to:  C-1 Patient   DME arranged  Vassie Moselle      DME agency  [place] arranged  [place]   Status of service:  In process, will continue to follow Medicare Important Message given?   (If response is "NO", the following Medicare IM given date fields will be blank) Date Medicare IM given:   Medicare IM given by:   Date Additional Medicare IM given:   Additional Medicare IM given by:    Discharge Disposition:    Per UR Regulation:  Reviewed for med. necessity/level of care/duration of stay  If discussed at [place] of Stay Meetings, dates discussed:    Comments:  Suanne Marker Sinclaire Artiga,RN,BSN,CCM

## 2014-11-22 NOTE — Evaluation (Signed)
Physical Therapy Evaluation Patient Details Name: [name] [name] MRN: [id number] DOB: Aug 18, 1951 Today's Date: 11/22/2014   History of Present Illness  LTKA  Clinical Impression  Patient tolerated well. Able to perform SLR, knee flexion on left  =60 degrees.   Pt will benefit from PT to address problems listed in note below.    Follow Up Recommendations Home health PT;Supervision/Assistance - 24 hour    Equipment Recommendations  Rolling walker with 5" wheels    Recommendations for Other Services       Precautions / Restrictions Precautions Precautions: Knee;Fall Required Braces or Orthoses: Knee Immobilizer - Left Knee Immobilizer - Left: Discontinue once straight leg raise with < 10 degree lag Restrictions Weight Bearing Restrictions: No      Mobility  Bed Mobility Overal bed mobility: Needs Assistance Bed Mobility: Sit to Supine       Sit to supine: Min guard   General bed mobility comments: cues on technique.  Transfers Overall transfer level: Needs assistance Equipment used: Rolling walker (2 wheeled) Transfers: Sit to/from Stand Sit to Stand: Supervision         General transfer comment:  cues for L leg position and hand position  Ambulation/Gait Ambulation/Gait assistance: Min assist Ambulation Distance (Feet): 150 Feet Assistive device: Rolling walker (2 wheeled) Gait Pattern/deviations: Step-to pattern;Step-through pattern;Antalgic     General Gait Details: cues for sequence and posture.  Stairs            Wheelchair Mobility    Modified Rankin (Stroke Patients Only)       Balance                                             Pertinent Vitals/Pain Pain Assessment: 0-10 Pain Score: 6  Pain Location: posterior L knee Pain Descriptors / Indicators: Cramping;Discomfort;Aching Pain Intervention(s): Monitored during session;Premedicated before session;Ice applied    Home Living Family/patient expects to be  discharged to:: Private residence Living Arrangements: Spouse/significant other Available Help at Discharge: Family Type of Home: House Home Access: Stairs to enter Entrance Stairs-Rails: Right;Left;None Technical brewer of Steps: 3 Home Layout: One level   Additional Comments: has to check on RW    Prior Function Level of Independence: Independent               Hand Dominance        Extremity/Trunk Assessment               Lower Extremity Assessment: LLE deficits/detail   LLE Deficits / Details: able to perform SLR, knee flexion to 60     Communication   Communication: No difficulties  Cognition Arousal/Alertness: Awake/alert Behavior During Therapy: WFL for tasks assessed/performed Overall Cognitive Status: Within Functional Limits for tasks assessed                      General Comments      Exercises Total Joint Exercises Ankle Circles/Pumps: AROM;Right;Left;10 reps;Supine Quad Sets: AROM;Left;Right;10 reps;Supine Short Arc Quad: AROM;Left;Supine Heel Slides: AROM;Left;10 reps;Supine Hip ABduction/ADduction: AROM;Left;10 reps;Supine Straight Leg Raises: AROM;Left;10 reps;Supine      Assessment/Plan    PT Assessment Patient needs continued PT services  PT Diagnosis Difficulty walking   PT Problem List Decreased strength;Decreased range of motion;Decreased activity tolerance;Decreased mobility;Decreased knowledge of precautions;Decreased safety awareness;Decreased knowledge of use of DME;Pain  PT Treatment Interventions DME instruction;Gait training;Stair training;Functional mobility  training;Therapeutic activities;Therapeutic exercise;Patient/family education   PT Goals (Current goals can be found in the Care Plan section) Acute Rehab PT Goals Patient Stated Goal: to walk without pain PT Goal Formulation: With patient/family Time For Goal Achievement: 11/26/14 Potential to Achieve Goals: Good    Frequency 7X/week   Barriers  to discharge        Co-evaluation               End of Session Equipment Utilized During Treatment: Left knee immobilizer Activity Tolerance: Patient tolerated treatment well Patient left: in bed;with call bell/phone within reach;with family/visitor present Nurse Communication: Mobility status         Time: 1026-1106 PT Time Calculation (min) (ACUTE ONLY): 40 min   Charges:   PT Evaluation $Initial PT Evaluation Tier I: 1 Procedure PT Treatments $Gait Training: 8-22 mins $Therapeutic Exercise: 8-22 mins   PT G Codes:        Claretha Cooper 11/22/2014, 11:38 AM

## 2014-11-22 NOTE — Progress Notes (Signed)
Physical Therapy Treatment Patient Details Name: [name] [name] MRN: [id number] DOB: 1951-01-17 Today's Date: 11/22/2014    History of Present Illness LTKA    PT Comments    Patient progressing well.   Follow Up Recommendations  Home health PT;Supervision/Assistance - 24 hour     Equipment Recommendations  Rolling walker with 5" wheels    Recommendations for Other Services       Precautions / Restrictions Precautions Precautions: Knee;Fall    Mobility  Bed Mobility         Supine to sit: Supervision Sit to supine: Supervision      Transfers   Equipment used: Rolling walker (2 wheeled) Transfers: Sit to/from Stand Sit to Stand: Min guard         General transfer comment: verbal cues hand placement and LE management.  Ambulation/Gait Ambulation/Gait assistance: Min guard Ambulation Distance (Feet): 150 Feet Assistive device: Rolling walker (2 wheeled) Gait Pattern/deviations: Step-to pattern;Step-through pattern;Antalgic     General Gait Details: cues for sequence and posture.   Stairs            Wheelchair Mobility    Modified Rankin (Stroke Patients Only)       Balance                                    Cognition Arousal/Alertness: Awake/alert                          Exercises      General Comments        Pertinent Vitals/Pain Pain Score: 3  Pain Location: L thigh Pain Descriptors / Indicators: Aching;Sore Pain Intervention(s): Monitored during session;Premedicated before session;Ice applied    Home Living                      Prior Function            PT Goals (current goals can now be found in the care plan section) Progress towards PT goals: Progressing toward goals    Frequency  7X/week    PT Plan Current plan remains appropriate    Co-evaluation             End of Session   Activity Tolerance: Patient tolerated treatment well Patient left: in bed;with call  bell/phone within reach;with family/visitor present     Time: 4481-8563 PT Time Calculation (min) (ACUTE ONLY): 20 min  Charges:  $Gait Training: 8-22 mins                    G Codes:      [name] [name] 11/22/2014, 6:46 PM

## 2014-11-22 NOTE — Progress Notes (Signed)
OT Cancellation Note  Patient Details Name: [name] [name] MRN: [id number] DOB: 1951/03/29   Cancelled Treatment:    Reason Eval/Treat Not Completed: Other (comment) Pt in the CPM resting. Will try back later time.  [place], Ruby 11/22/2014, 12:34 PM

## 2014-11-23 LAB — BASIC METABOLIC PANEL
Anion gap: 9 (ref 5–15)
BUN: 13 mg/dL (ref 6–23)
CO2: 27 mmol/L (ref 19–32)
CREATININE: 0.7 mg/dL (ref 0.50–1.10)
Calcium: 9.2 mg/dL (ref 8.4–10.5)
Chloride: 108 mmol/L (ref 96–112)
GFR calc Af Amer: >90 mL/min (ref >90)
GFR calc non Af Amer: >90 mL/min (ref >90)
Glucose, Bld: 105 mg/dL — ABNORMAL HIGH (ref 70–99)
Potassium: 3.6 mmol/L (ref 3.5–5.1)
Sodium: 144 mmol/L (ref 135–145)

## 2014-11-23 LAB — CBC
HCT: 32.8 % — ABNORMAL LOW (ref 36.0–46.0)
Hemoglobin: 11.1 g/dL — ABNORMAL LOW (ref 12.0–15.0)
MCH: 32.7 pg (ref 26.0–34.0)
MCHC: 33.8 g/dL (ref 30.0–36.0)
MCV: 96.8 fL (ref 78.0–100.0)
PLATELETS: 198 10*3/uL (ref 150–400)
RBC: 3.39 MIL/uL — AB (ref 3.87–5.11)
RDW: 13.2 % (ref 11.5–15.5)
WBC: 9.1 10*3/uL (ref 4.0–10.5)

## 2014-11-23 NOTE — Progress Notes (Signed)
   Subjective: 2 Days Post-Op Procedure(s) (LRB): LEFT TOTAL KNEE ARTHROPLASTY (Left) Patient reports pain as mild and moderate.   Patient seen in rounds for Dr. Wynelle Link.  Husband in room. Patient is well, but has had some minor complaints of pain in the knee, requiring pain medications Patient is ready to go home later today if she does well with PT  Objective: Vital signs in last 24 hours: Temp:  [97.8 F (36.6 C)-98.3 F (36.8 C)] 98.3 F (36.8 C) (02/17 0550) Pulse Rate:  [63-70] 70 (02/17 0550) Resp:  [15-18] 18 (02/17 0800) BP: (110-129)/(65-80) 126/80 mmHg (02/17 0550) SpO2:  [97 %-100 %] 98 % (02/17 0800)  Intake/Output from previous day:  Intake/Output Summary (Last 24 hours) at 11/23/14 1101 Last data filed at 11/23/14 0900  Gross per 24 hour  Intake   1160 ml  Output   1103 ml  Net     57 ml    Intake/Output this shift: Total I/O In: 480 [P.O.:480] Out: -   Labs:  Recent Labs  11/22/14 0445 11/23/14 0446  HGB 11.8* 11.1*    Recent Labs  11/22/14 0445 11/23/14 0446  WBC 9.8 9.1  RBC 3.64* 3.39*  HCT 34.6* 32.8*  PLT 193 198    Recent Labs  11/22/14 0445 11/23/14 0446  NA 138 144  K 4.1 3.6  CL 106 108  CO2 27 27  BUN 12 13  CREATININE 0.68 0.70  GLUCOSE 127* 105*  CALCIUM 9.0 9.2   No results for input(s): LABPT, INR in the last 72 hours.  EXAM: General - Patient is Alert, Appropriate and Oriented Extremity - Neurovascular intact Sensation intact distally Dorsiflexion/Plantar flexion intact Incision - clean, dry, no drainage Motor Function - intact, moving foot and toes well on exam.   Assessment/Plan: 2 Days Post-Op Procedure(s) (LRB): LEFT TOTAL KNEE ARTHROPLASTY (Left) Procedure(s) (LRB): LEFT TOTAL KNEE ARTHROPLASTY (Left) Past Medical History  Diagnosis Date  . Melanoma of skin   . Basal cell cancer 5/08  . Hyperlipidemia DIET CONTROL  . GERD (gastroesophageal reflux disease)     HH  . Eczema   . Chest pain 9/11      [facility]) and palpitations - ruled out for MI with nl. Echo  . Heart palpitations OCCASIONAL--  TAKES ATENOLOL PRN  . Normal nuclear stress test 07-09-10  . Arthritis     NECK  . Complication of anesthesia HARD TO WAKE  . Hiatal hernia   . Osteopenia 12/2013    T score -1.6 FRAX 16%/0.8%  . HSV-2 infection     buttocks  . Breast cyst   . PONV (postoperative nausea and vomiting)   . MVP (mitral valve prolapse)     per pt no treatment needed  . Difficulty sleeping   . Acute meniscal tear of knee    Active Problems:   OA (osteoarthritis) of knee  Estimated body mass index is 29.07 kg/(m^2) as calculated from the following:   Height as of this encounter: 5\' 6"  (1.676 m).   Weight as of this encounter: 81.647 kg (180 lb). Up with therapy Discharge home with home health Diet - Cardiac diet Follow up - in 2 weeks Activity - WBAT Disposition - Home Condition Upon Discharge - Good D/C Meds - See DC Summary DVT Prophylaxis - Xarelto  [name] Muslim, PA-C Orthopaedic Surgery 11/23/2014, 11:01 AM

## 2014-11-23 NOTE — Care Management Note (Signed)
    Page 1 of 2   11/23/2014     11:45:10 AM CARE MANAGEMENT NOTE 11/23/2014  Patient:  Comins,Elliana T   Account Number:  [id number]  Date Initiated:  11/22/2014  Documentation initiated by:  DAVIS,RHONDA  Subjective/Objective Assessment:   Left  Total Knee Arthroplasty     Action/Plan:   home when stable   Anticipated DC Date:  11/23/2014   Anticipated DC Plan:  HOME W HOME HEALTH SERVICES  In-house referral  NA      DC Planning Services  CM consult      PAC Choice  HOME HEALTH   Choice offered to / List presented to:  C-1 Patient   DME arranged  WALKER - ROLLING  3-N-1      DME agency  [facility]     HH arranged  HH-2 PT      HH agency  [facility]   Status of service:  Completed, signed off Medicare Important Message given?   (If response is "NO", the following Medicare IM given date fields will be blank) Date Medicare IM given:   Medicare IM given by:   Date Additional Medicare IM given:   Additional Medicare IM given by:    Discharge Disposition:  HOME W HOME HEALTH SERVICES  Per UR Regulation:  Reviewed for med. necessity/level of care/duration of stay  If discussed at Long Length of Stay Meetings, dates discussed:    Comments:  11/23/14 11:00 CM met with pt in room to offer choice of home health agency.  Pt chooses [facility] to render HHPT. Address and contact information verified by pt.  CM called [facility] DME rep, Lecretia to please deliver the 3n1 and rolling walker to room prior to discharge.  Referral emailed to [facility] rep, Tim with request for [facility] for PT.  No other CM needs were communicated. Sarah Jeffries, BSN, CM [phone].   Rhonda DAvis,RN,BSN,CCM   

## 2014-11-23 NOTE — Progress Notes (Signed)
OT Cancellation Note  Patient Details Name: [name] [name] MRN: [id number] DOB: 06-Aug-1951   Cancelled Treatment:    Reason Eval/Treat Not Completed: Other (comment) SPoke with pt and spouse and they feel comfortable with all ADL. He helped with her bath this am. They report a 3in1 has been ordered. Pt to d/c today.  Elsa, [place] 11/23/2014, 11:30 AM

## 2014-11-23 NOTE — Progress Notes (Signed)
Physical Therapy Treatment Patient Details Name: TANYAH DEBRUYNE MRN: [id number] DOB: 1950/12/29 Today's Date: 11/23/2014    History of Present Illness LTKA    PT Comments    Patient reports that CPM was painful.  Has practiced step and exercises. Ready for Dc.  Follow Up Recommendations  Home health PT;Supervision/Assistance - 24 hour     Equipment Recommendations  Rolling walker with 5" wheels    Recommendations for Other Services       Precautions / Restrictions Precautions Precautions: Knee;Fall Restrictions Weight Bearing Restrictions: No    Mobility  Bed Mobility   Bed Mobility: Supine to Sit;Sit to Supine     Supine to sit: Supervision Sit to supine: Supervision      Transfers Overall transfer level: Needs assistance Equipment used: Rolling walker (2 wheeled) Transfers: Sit to/from Stand Sit to Stand: Supervision         General transfer comment: verbal cues hand placement and LE management.  Ambulation/Gait Ambulation/Gait assistance: Min guard Ambulation Distance (Feet): 50 Feet Assistive device: Rolling walker (2 wheeled) Gait Pattern/deviations: Step-to pattern;Antalgic     General Gait Details: cues for sequence and posture.   Stairs Stairs: Yes Stairs assistance: Min assist Stair Management: One rail Right;Forwards Number of Stairs: 2 General stair comments: spouse present to assist   Wheelchair Mobility    Modified Rankin (Stroke Patients Only)       Balance                                    Cognition Arousal/Alertness: Awake/alert                          Exercises Total Joint Exercises Ankle Circles/Pumps: AROM;Right;Left;10 reps;Supine Quad Sets: AROM;Left;Right;10 reps;Supine Short Arc Quad: AROM;Left;Supine Heel Slides: AROM;Left;10 reps;Supine Hip ABduction/ADduction: AROM;Left;10 reps;Supine Straight Leg Raises: AROM;Left;10 reps;Supine Goniometric ROM: 10-60degrees    General  Comments        Pertinent Vitals/Pain Pain Score: 6  Pain Location: L knee Pain Descriptors / Indicators: Aching    Home Living                      Prior Function            PT Goals (current goals can now be found in the care plan section) Progress towards PT goals: Progressing toward goals    Frequency  7X/week    PT Plan Current plan remains appropriate    Co-evaluation             End of Session   Activity Tolerance: Patient tolerated treatment well Patient left: with call bell/phone within reach     Time: 1015-1057 PT Time Calculation (min) (ACUTE ONLY): 42 min  Charges:  $Gait Training: 8-22 mins $Therapeutic Exercise: 8-22 mins $Self Care/Home Management: 8-22                    G Codes:      Claretha Cooper 11/23/2014, 11:19 AM

## 2014-12-13 ENCOUNTER — Other Ambulatory Visit: Payer: Self-pay | Admitting: Family Medicine

## 2014-12-26 ENCOUNTER — Encounter: Payer: Self-pay | Admitting: Internal Medicine

## 2014-12-30 ENCOUNTER — Other Ambulatory Visit: Payer: Managed Care, Other (non HMO)

## 2015-01-03 ENCOUNTER — Ambulatory Visit
Admission: RE | Admit: 2015-01-03 | Discharge: 2015-01-03 | Disposition: A | Discharge status: Home / Self Care | Payer: Managed Care, Other (non HMO) | Source: Ambulatory Visit | Attending: Gynecology | Admitting: Gynecology

## 2015-01-03 DIAGNOSIS — N6002 Solitary cyst of left breast: Secondary | ICD-10-CM

## 2015-01-16 ENCOUNTER — Encounter: Payer: Self-pay | Admitting: Family Medicine

## 2015-01-16 ENCOUNTER — Ambulatory Visit (INDEPENDENT_AMBULATORY_CARE_PROVIDER_SITE_OTHER): Payer: Managed Care, Other (non HMO) | Admitting: Family Medicine

## 2015-01-16 VITALS — BP 116/82 | HR 89 | Temp 98.3°F | Wt 177.0 lb

## 2015-01-16 DIAGNOSIS — J069 Acute upper respiratory infection, unspecified: Secondary | ICD-10-CM | POA: Diagnosis not present

## 2015-01-16 DIAGNOSIS — J029 Acute pharyngitis, unspecified: Secondary | ICD-10-CM

## 2015-01-16 DIAGNOSIS — B9789 Other viral agents as the cause of diseases classified elsewhere: Principal | ICD-10-CM

## 2015-01-16 LAB — POCT RAPID STREP A (OFFICE): Rapid Strep A Screen: NEGATIVE

## 2015-01-16 MED ORDER — ACYCLOVIR 200 MG PO CAPS: ORAL_CAPSULE | ORAL | Status: DC

## 2015-01-16 MED ORDER — ACYCLOVIR 5 % EX CREA: TOPICAL_CREAM | CUTANEOUS | Status: DC

## 2015-01-16 NOTE — Progress Notes (Signed)
Pre visit review using our clinic review tool, if applicable. No additional management support is needed unless otherwise documented below in the visit note. 

## 2015-01-16 NOTE — Assessment & Plan Note (Signed)
And ST (neg rapid strep) Disc symptomatic care - see instructions on AVS  Update if not starting to improve in a week or if worsening

## 2015-01-16 NOTE — Progress Notes (Signed)
Subjective:    Patient ID: [name] [name], female    DOB: 1951/01/09, 64 y.o.   MRN: [id number]  HPI Here with a sore throat  Started on Friday  Got worse and worse  No fever but temp is above her normal   Some cough  Feels bad Popping in R ear  ST is worse on L ear  A little nasal congestion -not bad   No sinus pain   OTc took robitussin cold and cough  Salt water gargle  Cannot sleep well   Had her knee replacement  Did very well  8 weeks ago   Rapid strep test neg   Patient Active Problem List   Diagnosis Date Noted  . OA (osteoarthritis) of knee 11/21/2014  . Pre-operative examination 10/21/2014  . UTI (urinary tract infection) 07/25/2014  . Colon cancer screening 11/29/2013  . Routine general medical examination at a health care facility 09/16/2011  . DERMATITIS, ATOPIC 11/27/2010  . URTICARIA DUE TO COLD OR HEAT 11/27/2010  . STRESS REACTION, ACUTE, WITH EMOTIONAL DISTURBANCE 09/14/2010  . THROAT PAIN, CHRONIC 09/14/2010  . EXTERNAL HEMORRHOIDS WITHOUT MENTION COMP 06/25/2010  . ABNORMAL EKG 12/26/2009  . OTHER CHRONIC SINUSITIS 10/19/2009  . UNSPECIFIED VITAMIN D DEFICIENCY 04/21/2009  . ALLERGIC RHINITIS 11/09/2008  . GERD 07/28/2008  . OSTEOPENIA 03/14/2008  . HYPERCHOLESTEROLEMIA, PURE 03/13/2007  . HIATAL HERNIA 01/12/2007   Past Medical History  Diagnosis Date  . Melanoma of skin   . Basal cell cancer 5/08  . Hyperlipidemia DIET CONTROL  . GERD (gastroesophageal reflux disease)     HH  . Eczema   . Chest pain 9/11    [facility]) and palpitations - ruled out for MI with nl. Echo  . Heart palpitations OCCASIONAL--  TAKES ATENOLOL PRN  . Normal nuclear stress test 07-09-10  . Arthritis     NECK  . Complication of anesthesia HARD TO WAKE  . Hiatal hernia   . Osteopenia 12/2013    T score -1.6 FRAX 16%/0.8%  . HSV-2 infection     buttocks  . Breast cyst   . PONV (postoperative nausea and vomiting)   . MVP (mitral valve prolapse)     per pt  no treatment needed  . Difficulty sleeping   . Acute meniscal tear of knee    Past Surgical History  Procedure Laterality Date  . Mri neck  3/00    C4-C5 herniation small stenosis mild C4-5, C5-6  . Melanoma excision      TRUNK  . Carotid US  9/05    Mild, recheck 1 year  . Carotid US  10/06    Normal  . Esophagogastroduodenoscopy  9/07    Normal  . Abnormal mole removed from leg - ? basal cell    . Hysteroscopy with resectoscope  2000    POLPECTOMY'S  . Tubal ligation  23 YRS AGO  . Tonsillectomy    . Transthoracic echocardiogram  06-29-2010    NORMAL LVSF, EF 55-60%,  MILD MR  . Knee arthroscopy  10/23/2011    Procedure: ARTHROSCOPY KNEE;  Surgeon: Gearlean Alf, MD;  Location: [facility];  Service: Orthopedics;  Laterality: Left;  LEFT KNEE SCOPE WITH DEBRIDEMENT   . Chondroplasty  10/23/2011    Procedure: CHONDROPLASTY;  Surgeon: Gearlean Alf, MD;  Location: [facility];  Service: Orthopedics;  Laterality: Left;  Left medial   . Vaginal hysterectomy  2001    complex hyperplasia with mother's  history of uterine cancer  . Total knee arthroplasty Left 11/21/2014    Procedure: LEFT TOTAL KNEE ARTHROPLASTY;  Surgeon: Gearlean Alf, MD;  Location: [facility];  Service: Orthopedics;  Laterality: Left;   History  Substance Use Topics  . Smoking status: Former Smoker    Types: Cigarettes    Quit date: 10/07/1977  . Smokeless tobacco: Never Used  . Alcohol Use: No   Family History  Problem Relation Age of Onset  . Hypertension Mother   . Cancer Mother     uterine  . Obesity Mother   . Thrombocytopenia Mother   . Diabetes Mother   . Alcohol abuse Father   . Heart disease Father     CAD in 2's  . Emphysema Father   . Heart disease Paternal Aunt     CAD  . Heart disease Paternal Uncle     CAD  . Colon cancer Neg Hx   . Diabetes Maternal Grandmother     type 2  . Diabetes Maternal Grandfather     type 2   Allergies  Allergen  Reactions  . Amoxicillin Rash  . Codeine Rash  . Clarithromycin     REACTION: reaction not known   Current Outpatient Prescriptions on File Prior to Visit  Medication Sig Dispense Refill  . acetaminophen (TYLENOL) 325 MG tablet Take 650 mg by mouth every 6 (six) hours as needed for mild pain.    Marland Kitchen acyclovir (ZOVIRAX) 200 MG capsule Take 1 capsule (200 mg total) by mouth as needed. Take 200 mg by mouth twice weekly as needed. 30 capsule 3  . atenolol (TENORMIN) 25 MG tablet TAKE 1/2 TABLET BY MOUTH DAILY (Patient taking differently: TAKE 1/2 TABLET BY MOUTH DAILY PRN PALPITAIONS) 30 tablet 2  . diphenhydrAMINE (BENADRYL) 25 mg capsule Take 25 mg by mouth every 6 (six) hours as needed for sleep.     . hydrocortisone (ANUSOL-HC) 2.5 % rectal cream Place 1 application rectally as needed. Apply to affected area once daily as needed for hemorrhoids. 30 g 1  . magnesium gluconate (MAGONATE) 500 MG tablet Take 500 mg by mouth daily.    . methocarbamol (ROBAXIN) 500 MG tablet Take 1 tablet (500 mg total) by mouth every 6 (six) hours as needed for muscle spasms. 80 tablet 0  . oxyCODONE (OXY IR/ROXICODONE) 5 MG immediate release tablet Take 1-2 tablets (5-10 mg total) by mouth every 3 (three) hours as needed for moderate pain, severe pain or breakthrough pain. 90 tablet 0  . Polyethyl Glycol-Propyl Glycol (SYSTANE OP) Apply 1 drop to eye daily as needed (Dry eyes).    . rivaroxaban (XARELTO) 10 MG TABS tablet Take 1 tablet (10 mg total) by mouth daily with breakfast. Take Xarelto for two and a half more weeks, then discontinue Xarelto. Once the patient has completed the Xarelto, they may resume the 81 mg Aspirin. 19 tablet 0  . traMADol (ULTRAM) 50 MG tablet Take 1-2 tablets (50-100 mg total) by mouth every 6 (six) hours as needed (mild pain). 60 tablet 1  . ZOVIRAX 5 % APPLY DAILY AS NEEDED 5 g 0   No current facility-administered medications on file prior to visit.        Review of  Systems Review of Systems  Constitutional: Negative for fever, appetite change, and unexpected weight change.  ENT pos for ST and mild congestion/ neg for sinus pain  Eyes: Negative for pain and visual disturbance.  Respiratory: Negative for wheeze  and shortness  of breath.   Cardiovascular: Negative for cp or palpitations    Gastrointestinal: Negative for nausea, diarrhea and constipation.  Genitourinary: Negative for urgency and frequency.  Skin: Negative for pallor or rash   Neurological: Negative for weakness, light-headedness, numbness and headaches.  Hematological: Negative for adenopathy. Does not bruise/bleed easily.  Psychiatric/Behavioral: Negative for dysphoric mood. The patient is not nervous/anxious.         Objective:   Physical Exam  Constitutional: She appears well-developed and well-nourished. No distress.  HENT:  Head: Normocephalic and atraumatic.  Right Ear: External ear normal.  Left Ear: External ear normal.  Mouth/Throat: No oropharyngeal exudate.  Nares are injected and congested (mild) No sinus tenderness Clear rhinorrhea and post nasal drip  Throat-mild post injection   Eyes: Conjunctivae and EOM are normal. Pupils are equal, round, and reactive to light. Right eye exhibits no discharge. Left eye exhibits no discharge.  Neck: Normal range of motion. Neck supple.  Cardiovascular: Normal rate and normal heart sounds.   Pulmonary/Chest: Effort normal and breath sounds normal. No respiratory distress. She has no wheezes. She has no rales. She exhibits no tenderness.  Lymphadenopathy:    She has no cervical adenopathy.  Neurological: She is alert.  Skin: Skin is warm and dry. No rash noted.  Psychiatric: She has a normal mood and affect.          Assessment & Plan:   Problem List Items Addressed This Visit      Respiratory   Viral URI with cough - Primary    And ST (neg rapid strep) Disc symptomatic care - see instructions on AVS  Update if not  starting to improve in a week or if worsening        Relevant Medications   clindamycin (CLEOCIN) 300 MG capsule   acyclovir (ZOVIRAX) 200 MG capsule   acyclovir cream (ZOVIRAX) 5 %     Other   Sore throat   Relevant Orders   POCT rapid strep A (Completed)

## 2015-01-16 NOTE — Patient Instructions (Signed)
I think you have a viral upper respiratory infection - causing sore throat and cough  Drink lots of fluids  Continue salt water gargles and try chloraseptic products  Tylenol and ibuprofen or aleve  over can help sore throat  Get rest as rest   Update if not starting to improve in a week or if worsening

## 2015-02-02 ENCOUNTER — Telehealth: Payer: Self-pay | Admitting: *Deleted

## 2015-02-02 NOTE — Telephone Encounter (Signed)
Pt called c/o yeast infection or ? BV infection requesting Rx, I advised pt to make OV, transferred to front desk

## 2015-02-28 ENCOUNTER — Ambulatory Visit (INDEPENDENT_AMBULATORY_CARE_PROVIDER_SITE_OTHER): Payer: Managed Care, Other (non HMO) | Admitting: Family Medicine

## 2015-02-28 ENCOUNTER — Encounter: Payer: Self-pay | Admitting: Family Medicine

## 2015-02-28 VITALS — BP 118/78 | HR 64 | Temp 98.3°F | Ht 65.0 in | Wt 177.5 lb

## 2015-02-28 DIAGNOSIS — M62838 Other muscle spasm: Secondary | ICD-10-CM | POA: Insufficient documentation

## 2015-02-28 DIAGNOSIS — M6249 Contracture of muscle, multiple sites: Secondary | ICD-10-CM | POA: Diagnosis not present

## 2015-02-28 NOTE — Progress Notes (Signed)
Subjective:    Patient ID: [name] [name], female    DOB: 1951-05-31, 64 y.o.   MRN: [id number]  HPI Here for L sided neck pain last week  Then moved to R side and front of neck   ? If she got a crick in her neck from the way she slept  Hurts most to flex neck and rotate left  Also to tilt her head  It gives her a dull headache   No sore throat - just externally   No fever   Tried some ice on neck - perhaps helped a bit   Patient Active Problem List   Diagnosis Date Noted  . Viral URI with cough 01/16/2015  . Sore throat 01/16/2015  . OA (osteoarthritis) of knee 11/21/2014  . Pre-operative examination 10/21/2014  . UTI (urinary tract infection) 07/25/2014  . Colon cancer screening 11/29/2013  . Routine general medical examination at a health care facility 09/16/2011  . DERMATITIS, ATOPIC 11/27/2010  . URTICARIA DUE TO COLD OR HEAT 11/27/2010  . STRESS REACTION, ACUTE, WITH EMOTIONAL DISTURBANCE 09/14/2010  . THROAT PAIN, CHRONIC 09/14/2010  . EXTERNAL HEMORRHOIDS WITHOUT MENTION COMP 06/25/2010  . ABNORMAL EKG 12/26/2009  . OTHER CHRONIC SINUSITIS 10/19/2009  . UNSPECIFIED VITAMIN D DEFICIENCY 04/21/2009  . ALLERGIC RHINITIS 11/09/2008  . GERD 07/28/2008  . OSTEOPENIA 03/14/2008  . HYPERCHOLESTEROLEMIA, PURE 03/13/2007  . HIATAL HERNIA 01/12/2007   Past Medical History  Diagnosis Date  . Melanoma of skin   . Basal cell cancer 5/08  . Hyperlipidemia DIET CONTROL  . GERD (gastroesophageal reflux disease)     HH  . Eczema   . Chest pain 9/11    [facility]) and palpitations - ruled out for MI with nl. Echo  . Heart palpitations OCCASIONAL--  TAKES ATENOLOL PRN  . Normal nuclear stress test 07-09-10  . Arthritis     NECK  . Complication of anesthesia HARD TO WAKE  . Hiatal hernia   . Osteopenia 12/2013    T score -1.6 FRAX 16%/0.8%  . HSV-2 infection     buttocks  . Breast cyst   . PONV (postoperative nausea and vomiting)   . MVP (mitral valve prolapse)    per pt no treatment needed  . Difficulty sleeping   . Acute meniscal tear of knee    Past Surgical History  Procedure Laterality Date  . Mri neck  3/00    C4-C5 herniation small stenosis mild C4-5, C5-6  . Melanoma excision      TRUNK  . Carotid US  9/05    Mild, recheck 1 year  . Carotid US  10/06    Normal  . Esophagogastroduodenoscopy  9/07    Normal  . Abnormal mole removed from leg - ? basal cell    . Hysteroscopy with resectoscope  2000    POLPECTOMY'S  . Tubal ligation  23 YRS AGO  . Tonsillectomy    . Transthoracic echocardiogram  06-29-2010    NORMAL LVSF, EF 55-60%,  MILD MR  . Knee arthroscopy  10/23/2011    Procedure: ARTHROSCOPY KNEE;  Surgeon: Gearlean Alf, MD;  Location: [facility];  Service: Orthopedics;  Laterality: Left;  LEFT KNEE SCOPE WITH DEBRIDEMENT   . Chondroplasty  10/23/2011    Procedure: CHONDROPLASTY;  Surgeon: Gearlean Alf, MD;  Location: [facility];  Service: Orthopedics;  Laterality: Left;  Left medial   . Vaginal hysterectomy  2001    complex hyperplasia with  mother's history of uterine cancer  . Total knee arthroplasty Left 11/21/2014    Procedure: LEFT TOTAL KNEE ARTHROPLASTY;  Surgeon: Gearlean Alf, MD;  Location: [facility];  Service: Orthopedics;  Laterality: Left;   History  Substance Use Topics  . Smoking status: Former Smoker    Types: Cigarettes    Quit date: 10/07/1977  . Smokeless tobacco: Never Used  . Alcohol Use: No   Family History  Problem Relation Age of Onset  . Hypertension Mother   . Cancer Mother     uterine  . Obesity Mother   . Thrombocytopenia Mother   . Diabetes Mother   . Alcohol abuse Father   . Heart disease Father     CAD in 38's  . Emphysema Father   . Heart disease Paternal Aunt     CAD  . Heart disease Paternal Uncle     CAD  . Colon cancer Neg Hx   . Diabetes Maternal Grandmother     type 2  . Diabetes Maternal Grandfather     type 2   Allergies  Allergen  Reactions  . Amoxicillin Rash  . Codeine Rash  . Clarithromycin     REACTION: reaction not known   Current Outpatient Prescriptions on File Prior to Visit  Medication Sig Dispense Refill  . acetaminophen (TYLENOL) 325 MG tablet Take 650 mg by mouth every 6 (six) hours as needed for mild pain.    Marland Kitchen acyclovir (ZOVIRAX) 200 MG capsule Take 200 mg by mouth daily as needed 60 capsule 3  . acyclovir cream (ZOVIRAX) 5 % APPLY DAILY AS NEEDED 5 g 3  . atenolol (TENORMIN) 25 MG tablet TAKE 1/2 TABLET BY MOUTH DAILY (Patient taking differently: TAKE 1/2 TABLET BY MOUTH DAILY PRN PALPITAIONS) 30 tablet 2  . clindamycin (CLEOCIN) 300 MG capsule   0  . diphenhydrAMINE (BENADRYL) 25 mg capsule Take 25 mg by mouth every 6 (six) hours as needed for sleep.     . hydrocortisone (ANUSOL-HC) 2.5 % rectal cream Place 1 application rectally as needed. Apply to affected area once daily as needed for hemorrhoids. 30 g 1  . magnesium gluconate (MAGONATE) 500 MG tablet Take 500 mg by mouth daily.    . methocarbamol (ROBAXIN) 500 MG tablet Take 1 tablet (500 mg total) by mouth every 6 (six) hours as needed for muscle spasms. 80 tablet 0  . oxyCODONE (OXY IR/ROXICODONE) 5 MG immediate release tablet Take 1-2 tablets (5-10 mg total) by mouth every 3 (three) hours as needed for moderate pain, severe pain or breakthrough pain. 90 tablet 0  . Polyethyl Glycol-Propyl Glycol (SYSTANE OP) Apply 1 drop to eye daily as needed (Dry eyes).    . rivaroxaban (XARELTO) 10 MG TABS tablet Take 1 tablet (10 mg total) by mouth daily with breakfast. Take Xarelto for two and a half more weeks, then discontinue Xarelto. Once the patient has completed the Xarelto, they may resume the 81 mg Aspirin. 19 tablet 0  . traMADol (ULTRAM) 50 MG tablet Take 1-2 tablets (50-100 mg total) by mouth every 6 (six) hours as needed (mild pain). 60 tablet 1   No current facility-administered medications on file prior to visit.     Review of Systems Review  of Systems  Constitutional: Negative for fever, appetite change, fatigue and unexpected weight change.  Eyes: Negative for pain and visual disturbance.  Respiratory: Negative for cough and shortness of breath.   Cardiovascular: Negative for cp or palpitations  Gastrointestinal: Negative for nausea, diarrhea and constipation.  Genitourinary: Negative for urgency and frequency.  Skin: Negative for pallor or rash   MSK pos for neck pain worse on the L  Neurological: Negative for weakness, light-headedness, numbness and headaches.  Hematological: Negative for adenopathy. Does not bruise/bleed easily.  Psychiatric/Behavioral: Negative for dysphoric mood. The patient is not nervous/anxious.         Objective:   Physical Exam  Constitutional: She appears well-developed and well-nourished. No distress.  HENT:  Head: Normocephalic and atraumatic.  Mouth/Throat: Oropharynx is clear and moist.  Eyes: Conjunctivae and EOM are normal. Pupils are equal, round, and reactive to light. No scleral icterus.  Neck: Trachea normal and phonation normal. Neck supple. No JVD present. Muscular tenderness present. No spinous process tenderness present. No tracheal deviation present. No thyromegaly present.  Tender over paracervical musculature bilaterally worse on L and in L trapezius Flex 30 deg/ext 10 deg and pain to rotate L   No crepitus or bony pain  No neuro changes   Cardiovascular: Normal rate and regular rhythm.   Pulmonary/Chest: Effort normal and breath sounds normal.  Musculoskeletal: She exhibits tenderness.  Lymphadenopathy:    She has no cervical adenopathy.  Neurological: She is alert. She has normal strength and normal reflexes. She displays no atrophy and no tremor. No cranial nerve deficit or sensory deficit. She exhibits normal muscle tone. Coordination normal.  Skin: Skin is warm and dry. No rash noted. No erythema.  Psychiatric: She has a normal mood and affect.            Assessment & Plan:   Problem List Items Addressed This Visit    Cervical paraspinal muscle spasm - Primary    Bilateral/worse on the L  Recommend stretching/heat / better pillow size for side sleeping  Pt has a muscle relaxer already- Robaxin that she tol well/cautioned of sedation  Aleve prn  Update if not imp in 1 week or if any neuro symptoms  May consider PT

## 2015-02-28 NOTE — Patient Instructions (Signed)
Work with your pillow/pillows - to find a height that feels good to you  Try your Robaxin (methocarbamol)- a muscle relaxer - caution of sedation  Aleve is ok with food  Use heat - gentle heat on neck for 10 minutes at a time  If no improved in 1-2 weeks let me know and we will refer you physical therapy

## 2015-02-28 NOTE — Progress Notes (Signed)
Pre visit review using our clinic review tool, if applicable. No additional management support is needed unless otherwise documented below in the visit note. 

## 2015-02-28 NOTE — Assessment & Plan Note (Addendum)
Bilateral/worse on the L  Recommend stretching/heat / better pillow size for side sleeping  Pt has a muscle relaxer already- Robaxin that she tol well/cautioned of sedation  Aleve prn  Update if not imp in 1 week or if any neuro symptoms  May consider PT

## 2015-04-19 ENCOUNTER — Ambulatory Visit (INDEPENDENT_AMBULATORY_CARE_PROVIDER_SITE_OTHER): Payer: Managed Care, Other (non HMO) | Admitting: Family Medicine

## 2015-04-19 ENCOUNTER — Encounter: Payer: Self-pay | Admitting: Family Medicine

## 2015-04-19 VITALS — BP 102/60 | HR 66 | Temp 97.2°F | Ht 65.0 in | Wt 180.5 lb

## 2015-04-19 DIAGNOSIS — R109 Unspecified abdominal pain: Secondary | ICD-10-CM | POA: Diagnosis not present

## 2015-04-19 DIAGNOSIS — R1031 Right lower quadrant pain: Secondary | ICD-10-CM

## 2015-04-19 LAB — POCT URINALYSIS DIPSTICK
BILIRUBIN UA: NEGATIVE
Glucose, UA: NEGATIVE
Ketones, UA: NEGATIVE
Nitrite, UA: NEGATIVE
Protein, UA: NEGATIVE
RBC UA: NEGATIVE
Spec Grav, UA: 1.02
Urobilinogen, UA: NEGATIVE
pH, UA: 6

## 2015-04-19 LAB — COMPREHENSIVE METABOLIC PANEL
ALBUMIN: 4 g/dL (ref 3.5–5.2)
ALK PHOS: 72 U/L (ref 39–117)
ALT: 18 U/L (ref 0–35)
AST: 24 U/L (ref 0–37)
BILIRUBIN TOTAL: 0.5 mg/dL (ref 0.2–1.2)
BUN: 17 mg/dL (ref 6–23)
CO2: 29 meq/L (ref 19–32)
CREATININE: 0.81 mg/dL (ref 0.50–1.10)
Calcium: 9.5 mg/dL (ref 8.4–10.5)
Chloride: 103 mEq/L (ref 96–112)
Glucose, Bld: 86 mg/dL (ref 70–99)
Potassium: 4.3 mEq/L (ref 3.5–5.3)
Sodium: 140 mEq/L (ref 135–145)
Total Protein: 6.4 g/dL (ref 6.0–8.3)

## 2015-04-19 LAB — CBC WITH DIFFERENTIAL/PLATELET
Basophils Absolute: 0 10*3/uL (ref 0.0–0.1)
Basophils Relative: 0 % (ref 0–1)
EOS PCT: 1 % (ref 0–5)
Eosinophils Absolute: 0.1 10*3/uL (ref 0.0–0.7)
HCT: 42.9 % (ref 36.0–46.0)
HEMOGLOBIN: 15.1 g/dL — AB (ref 12.0–15.0)
LYMPHS PCT: 34 % (ref 12–46)
Lymphs Abs: 2.1 10*3/uL (ref 0.7–4.0)
MCH: 32.6 pg (ref 26.0–34.0)
MCHC: 35.2 g/dL (ref 30.0–36.0)
MCV: 92.7 fL (ref 78.0–100.0)
MONO ABS: 0.4 10*3/uL (ref 0.1–1.0)
MONOS PCT: 7 % (ref 3–12)
MPV: 10.4 fL (ref 8.6–12.4)
NEUTROS ABS: 3.6 10*3/uL (ref 1.7–7.7)
Neutrophils Relative %: 58 % (ref 43–77)
Platelets: 221 10*3/uL (ref 150–400)
RBC: 4.63 MIL/uL (ref 3.87–5.11)
RDW: 13.9 % (ref 11.5–15.5)
WBC: 6.2 10*3/uL (ref 4.0–10.5)

## 2015-04-19 NOTE — Progress Notes (Signed)
Pre visit review using our clinic review tool, if applicable. No additional management support is needed unless otherwise documented below in the visit note. 

## 2015-04-19 NOTE — Patient Instructions (Signed)
Labs now - for blood count and chemistries  Stop at check out - and let them know I referred you for a CT scan - it needs to be scheduled tomorrow  (you will need contrast)   Tonight - if worse pain or vomiting or fever - go to the ER

## 2015-04-19 NOTE — Progress Notes (Signed)
Subjective:    Patient ID: [name] [name], female    DOB: [birth year], 64 y.o.   MRN: [id number]  HPI Here with R side pain   Started about a week ago  Worse as the day goes on  Pretty sharp in nature - and sensitive to the touch but no rash  Hurts at rest / but worse when she moves   Mid to low abdomen to flank area   No urinary symptoms   Results for orders placed or performed in visit on 04/19/15  POCT urinalysis dipstick  Result Value Ref Range   Color, UA     Clarity, UA     Glucose, UA neg    Bilirubin, UA neg    Ketones, UA neg    Spec Grav, UA 1.020    Blood, UA neg    pH, UA 6.0    Protein, UA neg    Urobilinogen, UA negative    Nitrite, UA neg    Leukocytes, UA small (1+) (A) Negative   overall pretty clear   A little nausea at times No vomiting  More frequent stools /not loose and no blood   ? If eating makes it worse   Has had a hysterectomy in the past   Took some aleve  Perhaps helps a bit   Also some R sided ha-mild/ thinks unrelated   Patient Active Problem List   Diagnosis Date Noted  . Cervical paraspinal muscle spasm 02/28/2015  . Viral URI with cough 01/16/2015  . Sore throat 01/16/2015  . OA (osteoarthritis) of knee 11/21/2014  . Pre-operative examination 10/21/2014  . UTI (urinary tract infection) 07/25/2014  . Colon cancer screening 11/29/2013  . Routine general medical examination at a health care facility 09/16/2011  . DERMATITIS, ATOPIC 11/27/2010  . URTICARIA DUE TO COLD OR HEAT 11/27/2010  . STRESS REACTION, ACUTE, WITH EMOTIONAL DISTURBANCE 09/14/2010  . THROAT PAIN, CHRONIC 09/14/2010  . EXTERNAL HEMORRHOIDS WITHOUT MENTION COMP 06/25/2010  . ABNORMAL EKG 12/26/2009  . OTHER CHRONIC SINUSITIS 10/19/2009  . UNSPECIFIED VITAMIN D DEFICIENCY 04/21/2009  . ALLERGIC RHINITIS 11/09/2008  . GERD 07/28/2008  . OSTEOPENIA 03/14/2008  . HYPERCHOLESTEROLEMIA, PURE 03/13/2007  . HIATAL HERNIA 01/12/2007   Past Medical History    Diagnosis Date  . Melanoma of skin   . Basal cell cancer 5/08  . Hyperlipidemia DIET CONTROL  . GERD (gastroesophageal reflux disease)     HH  . Eczema   . Chest pain 9/11    [facility]) and palpitations - ruled out for MI with nl. Echo  . Heart palpitations OCCASIONAL--  TAKES ATENOLOL PRN  . Normal nuclear stress test 07-09-10  . Arthritis     NECK  . Complication of anesthesia HARD TO WAKE  . Hiatal hernia   . Osteopenia 12/2013    T score -1.6 FRAX 16%/0.8%  . HSV-2 infection     buttocks  . Breast cyst   . PONV (postoperative nausea and vomiting)   . MVP (mitral valve prolapse)     per pt no treatment needed  . Difficulty sleeping   . Acute meniscal tear of knee    Past Surgical History  Procedure Laterality Date  . Mri neck  3/00    C4-C5 herniation small stenosis mild C4-5, C5-6  . Melanoma excision      TRUNK  . Carotid US  9/05    Mild, recheck 1 year  . Carotid US  10/06    Normal  .  Esophagogastroduodenoscopy  9/07    Normal  . Abnormal mole removed from leg - ? basal cell    . Hysteroscopy with resectoscope  2000    POLPECTOMY'S  . Tubal ligation  23 YRS AGO  . Tonsillectomy    . Transthoracic echocardiogram  06-29-2010    NORMAL LVSF, EF 55-60%,  MILD MR  . Knee arthroscopy  10/23/2011    Procedure: ARTHROSCOPY KNEE;  Surgeon: Gearlean Alf, MD;  Location: [facility];  Service: Orthopedics;  Laterality: Left;  LEFT KNEE SCOPE WITH DEBRIDEMENT   . Chondroplasty  10/23/2011    Procedure: CHONDROPLASTY;  Surgeon: Gearlean Alf, MD;  Location: [facility];  Service: Orthopedics;  Laterality: Left;  Left medial   . Vaginal hysterectomy  2001    complex hyperplasia with mother's history of uterine cancer  . Total knee arthroplasty Left 11/21/2014    Procedure: LEFT TOTAL KNEE ARTHROPLASTY;  Surgeon: Gearlean Alf, MD;  Location: [facility];  Service: Orthopedics;  Laterality: Left;   History  Substance Use Topics  . Smoking  status: Former Smoker    Types: Cigarettes    Quit date: 10/07/1977  . Smokeless tobacco: Never Used  . Alcohol Use: No   Family History  Problem Relation Age of Onset  . Hypertension Mother   . Cancer Mother     uterine  . Obesity Mother   . Thrombocytopenia Mother   . Diabetes Mother   . Alcohol abuse Father   . Heart disease Father     CAD in 37's  . Emphysema Father   . Heart disease Paternal Aunt     CAD  . Heart disease Paternal Uncle     CAD  . Colon cancer Neg Hx   . Diabetes Maternal Grandmother     type 2  . Diabetes Maternal Grandfather     type 2   Allergies  Allergen Reactions  . Amoxicillin Rash  . Codeine Rash  . Clarithromycin     REACTION: reaction not known   Current Outpatient Prescriptions on File Prior to Visit  Medication Sig Dispense Refill  . acetaminophen (TYLENOL) 325 MG tablet Take 650 mg by mouth every 6 (six) hours as needed for mild pain.    Marland Kitchen acyclovir (ZOVIRAX) 200 MG capsule Take 200 mg by mouth daily as needed 60 capsule 3  . acyclovir cream (ZOVIRAX) 5 % APPLY DAILY AS NEEDED 5 g 3  . atenolol (TENORMIN) 25 MG tablet TAKE 1/2 TABLET BY MOUTH DAILY (Patient taking differently: TAKE 1/2 TABLET BY MOUTH DAILY PRN PALPITAIONS) 30 tablet 2  . clindamycin (CLEOCIN) 300 MG capsule   0  . diphenhydrAMINE (BENADRYL) 25 mg capsule Take 25 mg by mouth every 6 (six) hours as needed for sleep.     . hydrocortisone (ANUSOL-HC) 2.5 % rectal cream Place 1 application rectally as needed. Apply to affected area once daily as needed for hemorrhoids. 30 g 1  . magnesium gluconate (MAGONATE) 500 MG tablet Take 500 mg by mouth daily.    . methocarbamol (ROBAXIN) 500 MG tablet Take 1 tablet (500 mg total) by mouth every 6 (six) hours as needed for muscle spasms. 80 tablet 0  . oxyCODONE (OXY IR/ROXICODONE) 5 MG immediate release tablet Take 1-2 tablets (5-10 mg total) by mouth every 3 (three) hours as needed for moderate pain, severe pain or breakthrough  pain. 90 tablet 0  . Polyethyl Glycol-Propyl Glycol (SYSTANE OP) Apply 1 drop to eye daily  as needed (Dry eyes).    . rivaroxaban (XARELTO) 10 MG TABS tablet Take 1 tablet (10 mg total) by mouth daily with breakfast. Take Xarelto for two and a half more weeks, then discontinue Xarelto. Once the patient has completed the Xarelto, they may resume the 81 mg Aspirin. 19 tablet 0  . traMADol (ULTRAM) 50 MG tablet Take 1-2 tablets (50-100 mg total) by mouth every 6 (six) hours as needed (mild pain). 60 tablet 1   No current facility-administered medications on file prior to visit.    Review of Systems Review of Systems  Constitutional: Negative for fever, appetite change, fatigue and unexpected weight change.  Eyes: Negative for pain and visual disturbance.  Respiratory: Negative for cough and shortness of breath.   Cardiovascular: Negative for cp or palpitations    Gastrointestinal: Negative for , diarrhea and constipation. neg for black stool or blood in stool  Genitourinary: Negative for urgency and frequency.  Skin: Negative for pallor or rash   Neurological: Negative for weakness, light-headedness, numbness and headaches.  Hematological: Negative for adenopathy. Does not bruise/bleed easily.  Psychiatric/Behavioral: Negative for dysphoric mood. The patient is not nervous/anxious.         Objective:   Physical Exam  Constitutional: She appears well-developed and well-nourished. No distress.  overwt and appears mildly uncomfortable   HENT:  Head: Normocephalic.  Mouth/Throat: Oropharynx is clear and moist.  Eyes: Conjunctivae are normal. Pupils are equal, round, and reactive to light. No scleral icterus.  Neck: Normal range of motion. Neck supple.  Cardiovascular: Normal rate and regular rhythm.   Pulmonary/Chest: Effort normal and breath sounds normal.  Abdominal: Soft. Bowel sounds are normal. She exhibits no distension, no abdominal bruit, no pulsatile midline mass and no mass.  There is no hepatosplenomegaly. There is tenderness in the right lower quadrant. There is tenderness at McBurney's point. There is no rigidity, no rebound, no guarding, no CVA tenderness and negative Murphy's sign.  Pt has increased abdominal pain with movement/shaking the table   Lymphadenopathy:    She has no cervical adenopathy.  Neurological: She is alert.  Skin: Skin is warm and dry. No rash noted. No erythema. No pallor.  Psychiatric: She has a normal mood and affect.          Assessment & Plan:   Problem List Items Addressed This Visit    RLQ abdominal pain - Primary    With tenderness and worsened symptoms with movement  Disc differential which includes diverticular dz / appendix pathology or other  Lab today cbc and cmet  CT with contrast  Aware since it is after hours if symptoms worsen or change to go to the ED      Relevant Orders   CT Abdomen Pelvis W Contrast   Comprehensive metabolic panel (Completed)   CBC with Differential/Platelet (Completed)    Other Visit Diagnoses    Flank pain        Relevant Orders    POCT urinalysis dipstick (Completed)    Comprehensive metabolic panel (Completed)    CBC with Differential/Platelet (Completed)

## 2015-04-20 ENCOUNTER — Telehealth: Payer: Self-pay | Admitting: *Deleted

## 2015-04-20 ENCOUNTER — Telehealth: Payer: Self-pay

## 2015-04-20 ENCOUNTER — Ambulatory Visit (INDEPENDENT_AMBULATORY_CARE_PROVIDER_SITE_OTHER)
Admission: RE | Admit: 2015-04-20 | Discharge: 2015-04-20 | Disposition: A | Discharge status: Home / Self Care | Payer: Managed Care, Other (non HMO) | Source: Ambulatory Visit | Attending: Family Medicine | Admitting: Family Medicine

## 2015-04-20 DIAGNOSIS — R1031 Right lower quadrant pain: Secondary | ICD-10-CM | POA: Diagnosis not present

## 2015-04-20 MED ORDER — IOHEXOL 300 MG/ML  SOLN
100.0000 mL | Freq: Once | INTRAMUSCULAR | Status: AC | PRN
  Administered 2015-04-20: 100 mL via INTRAVENOUS

## 2015-04-20 NOTE — Telephone Encounter (Signed)
Pt notified of CT results and Dr. Marliss Coots recommendations/ instructions and verbalized understanding

## 2015-04-20 NOTE — Telephone Encounter (Signed)
PLEASE NOTE: All timestamps contained within this report are represented as [place] Standard Time. CONFIDENTIALTY NOTICE: This fax transmission is intended only for the addressee. It contains information that is legally privileged, confidential or otherwise protected from use or disclosure. If you are not the intended recipient, you are strictly prohibited from reviewing, disclosing, copying using or disseminating any of this information or taking any action in reliance on or regarding this information. If you have received this fax in error, please notify us immediately by telephone so that we can arrange for its return to [place]. Phone: [phone], Toll-Free: [phone], Fax: [phone] Page: 1 of 1 Call Id: [id number] [place] Patient Name: [name] [name] Gender: Female DOB: 1951-06-20 Age: 63 Y 22 D Return Phone Number: Address: City/State/Zip: [place] Client [place] Night - Client Client Site [facility] [place] Contact Type Call Call Type Labs Is this call to report lab results? Yes Tech Name Janett Billow Name of Lab [facility] Lab Phone Number [phone] Lab Reference Number n/a Initial Comment [name] states she is Janett Billow from [place] labs with lab results for Dr. Alba Cory CB# [phone] Nurse Assessment Guidelines Guideline Title Affirmed Question Affirmed Notes Nurse Date/Time Eilene Ghazi Time) Disp. Time Eilene Ghazi Time) Disposition Final User 04/19/2015 9:39:09 PM Paged On Call back to Call [place], Gordon 04/19/2015 9:49:50 PM Paged On Call back to Call Anselmo Maryann Alar, [facility] 04/19/2015 9:50:19 PM Lab Call Completed Yes [facility] After Care Instructions Given Call Event Type User Date / Time Description Paging DoctorName Phone DateTime Result/Outcome Message Type Notes Vertell Novak  [phone] 04/19/2015 9:49:50 PM Paged On Call Back to Call Center Doctor Paged no need for Cb Delay Vertell Novak 04/19/2015 9:50:05 PM Spoke with On Call - General Message Result on call reached

## 2015-04-20 NOTE — Assessment & Plan Note (Signed)
With tenderness and worsened symptoms with movement  Disc differential which includes diverticular dz / appendix pathology or other  Lab today cbc and cmet  CT with contrast  Aware since it is after hours if symptoms worsen or change to go to the ED

## 2015-04-20 NOTE — Telephone Encounter (Signed)
Stacy with CT called and advise there is no evidence of appendicitis and CT is finalized in EPIC, they will let pt go and advise her we will call her directly

## 2015-04-20 NOTE — Telephone Encounter (Signed)
I saw that and tried to make a result note on the result - but it would not let me  (?odd) Nl CT -reassuring Stool throughout colon so constipation could play a role  Try to inc fluids and fiber as tolerated Get miralax otc and take daily until BMs are regular over 1-2 weeks  Update if no imp -would consider GI ref

## 2015-05-02 ENCOUNTER — Other Ambulatory Visit: Payer: Self-pay

## 2015-05-02 DIAGNOSIS — Z1231 Encounter for screening mammogram for malignant neoplasm of breast: Secondary | ICD-10-CM

## 2015-05-10 ENCOUNTER — Observation Stay (HOSPITAL_COMMUNITY)
Admission: EM | Admit: 2015-05-10 | Discharge: 2015-05-11 | Disposition: A | Discharge status: Home / Self Care | Payer: Managed Care, Other (non HMO) | Attending: Internal Medicine | Admitting: Internal Medicine

## 2015-05-10 ENCOUNTER — Emergency Department (HOSPITAL_COMMUNITY): Payer: Managed Care, Other (non HMO)

## 2015-05-10 ENCOUNTER — Encounter (HOSPITAL_COMMUNITY): Payer: Self-pay | Admitting: Family Medicine

## 2015-05-10 ENCOUNTER — Telehealth: Payer: Self-pay | Admitting: Family Medicine

## 2015-05-10 DIAGNOSIS — M8580 Other specified disorders of bone density and structure, unspecified site: Secondary | ICD-10-CM | POA: Diagnosis not present

## 2015-05-10 DIAGNOSIS — E785 Hyperlipidemia, unspecified: Secondary | ICD-10-CM | POA: Insufficient documentation

## 2015-05-10 DIAGNOSIS — R0789 Other chest pain: Principal | ICD-10-CM | POA: Insufficient documentation

## 2015-05-10 DIAGNOSIS — E7800 Pure hypercholesterolemia, unspecified: Secondary | ICD-10-CM | POA: Diagnosis present

## 2015-05-10 DIAGNOSIS — Z7901 Long term (current) use of anticoagulants: Secondary | ICD-10-CM | POA: Diagnosis not present

## 2015-05-10 DIAGNOSIS — Z79899 Other long term (current) drug therapy: Secondary | ICD-10-CM | POA: Insufficient documentation

## 2015-05-10 DIAGNOSIS — I341 Nonrheumatic mitral (valve) prolapse: Secondary | ICD-10-CM | POA: Diagnosis not present

## 2015-05-10 DIAGNOSIS — Z8249 Family history of ischemic heart disease and other diseases of the circulatory system: Secondary | ICD-10-CM | POA: Insufficient documentation

## 2015-05-10 DIAGNOSIS — R079 Chest pain, unspecified: Secondary | ICD-10-CM

## 2015-05-10 DIAGNOSIS — Z87891 Personal history of nicotine dependence: Secondary | ICD-10-CM | POA: Diagnosis not present

## 2015-05-10 DIAGNOSIS — K219 Gastro-esophageal reflux disease without esophagitis: Secondary | ICD-10-CM | POA: Diagnosis not present

## 2015-05-10 DIAGNOSIS — M179 Osteoarthritis of knee, unspecified: Secondary | ICD-10-CM | POA: Diagnosis not present

## 2015-05-10 DIAGNOSIS — Z79891 Long term (current) use of opiate analgesic: Secondary | ICD-10-CM | POA: Insufficient documentation

## 2015-05-10 DIAGNOSIS — M1710 Unilateral primary osteoarthritis, unspecified knee: Secondary | ICD-10-CM | POA: Diagnosis present

## 2015-05-10 LAB — CBC
HCT: 44.6 % (ref 36.0–46.0)
Hemoglobin: 15.6 g/dL — ABNORMAL HIGH (ref 12.0–15.0)
MCH: 32.7 pg (ref 26.0–34.0)
MCHC: 35 g/dL (ref 30.0–36.0)
MCV: 93.5 fL (ref 78.0–100.0)
Platelets: 200 10*3/uL (ref 150–400)
RBC: 4.77 MIL/uL (ref 3.87–5.11)
RDW: 13.3 % (ref 11.5–15.5)
WBC: 6.3 10*3/uL (ref 4.0–10.5)

## 2015-05-10 LAB — I-STAT TROPONIN, ED: Troponin i, poc: 0 ng/mL (ref 0.00–0.08)

## 2015-05-10 LAB — BASIC METABOLIC PANEL
ANION GAP: 10 (ref 5–15)
BUN: 14 mg/dL (ref 6–20)
CALCIUM: 10.4 mg/dL — AB (ref 8.9–10.3)
CO2: 26 mmol/L (ref 22–32)
Chloride: 103 mmol/L (ref 101–111)
Creatinine, Ser: 0.85 mg/dL (ref 0.44–1.00)
GFR calc Af Amer: >60 mL/min (ref >60)
GFR calc non Af Amer: >60 mL/min (ref >60)
Glucose, Bld: 97 mg/dL (ref 65–99)
Potassium: 4.1 mmol/L (ref 3.5–5.1)
SODIUM: 139 mmol/L (ref 135–145)

## 2015-05-10 LAB — TROPONIN I

## 2015-05-10 MED ORDER — OXYCODONE HCL 5 MG PO TABS: 5.0000 mg | ORAL_TABLET | ORAL | Status: DC | PRN

## 2015-05-10 MED ORDER — ASPIRIN 81 MG PO CHEW
324.0000 mg | CHEWABLE_TABLET | Freq: Once | ORAL | Status: AC
  Administered 2015-05-10: 162 mg via ORAL
  Filled 2015-05-10: qty 4

## 2015-05-10 MED ORDER — NITROGLYCERIN 0.4 MG SL SUBL: 0.4000 mg | SUBLINGUAL_TABLET | SUBLINGUAL | Status: DC | PRN

## 2015-05-10 MED ORDER — METHOCARBAMOL 500 MG PO TABS
500.0000 mg | ORAL_TABLET | Freq: Four times a day (QID) | ORAL | Status: DC | PRN
  Filled 2015-05-10: qty 1

## 2015-05-10 MED ORDER — ENOXAPARIN SODIUM 40 MG/0.4ML ~~LOC~~ SOLN
40.0000 mg | SUBCUTANEOUS | Status: DC
  Administered 2015-05-10: 40 mg via SUBCUTANEOUS
  Filled 2015-05-10 (×2): qty 0.4

## 2015-05-10 MED ORDER — ACETAMINOPHEN 325 MG PO TABS: 650.0000 mg | ORAL_TABLET | Freq: Four times a day (QID) | ORAL | Status: DC | PRN

## 2015-05-10 MED ORDER — SODIUM CHLORIDE 0.9 % IJ SOLN
3.0000 mL | Freq: Two times a day (BID) | INTRAMUSCULAR | Status: DC
  Administered 2015-05-11: 3 mL via INTRAVENOUS

## 2015-05-10 MED ORDER — ACETAMINOPHEN 650 MG RE SUPP: 650.0000 mg | Freq: Four times a day (QID) | RECTAL | Status: DC | PRN

## 2015-05-10 MED ORDER — KETOROLAC TROMETHAMINE 15 MG/ML IJ SOLN: 15.0000 mg | Freq: Four times a day (QID) | INTRAMUSCULAR | Status: DC | PRN

## 2015-05-10 NOTE — ED Provider Notes (Signed)
CSN: [id number]     Arrival date & time 05/10/15  1435 History   First MD Initiated Contact with Patient 05/10/15 1542     Chief Complaint  Patient presents with  . Chest Pain     (Consider location/radiation/quality/duration/timing/severity/associated sxs/prior Treatment) Patient is a 64 y.o. female presenting with chest pain.  Chest Pain Pain location:  L chest Pain quality: pressure   Pain quality comment:  "heavy" Pain radiates to:  L arm, upper back and L shoulder Pain radiates to the back: yes   Pain severity:  Moderate Onset quality:  Gradual Duration:  1 day Timing:  Intermittent Progression:  Waxing and waning Chronicity:  New Relieved by:  Rest Worsened by:  Exertion, movement and certain positions (sitting straight up) Ineffective treatments: aspirin and rest helped. Associated symptoms: nausea   Associated symptoms: no abdominal pain, no back pain, no cough, no diaphoresis (feel hot/flushed), no fever, no headache, no shortness of breath, no syncope and not vomiting   Risk factors: no coronary artery disease, no diabetes mellitus, no high cholesterol (pt reports no, chart reports this), no hypertension, no prior DVT/PE, no smoking and no surgery   Risk factors comment:  Melaoma excised 23 yrs ago, no chemo. father 22yo MI   Past Medical History  Diagnosis Date  . Melanoma of skin   . Basal cell cancer 5/08  . Hyperlipidemia DIET CONTROL  . Eczema   . Chest pain 9/11    [facility]) and palpitations - ruled out for MI with nl. Echo  . Heart palpitations OCCASIONAL--  TAKES ATENOLOL PRN  . Normal nuclear stress test 07-09-10  . Complication of anesthesia HARD TO WAKE  . Hiatal hernia   . Osteopenia 12/2013    T score -1.6 FRAX 16%/0.8%  . HSV-2 infection     buttocks  . Breast cyst     "left"  . PONV (postoperative nausea and vomiting)   . MVP (mitral valve prolapse)     per pt no treatment needed  . Difficulty sleeping   . Acute meniscal tear of knee   .  GERD (gastroesophageal reflux disease)   . Arthritis     "neck; left knee before replacement" (05/10/2015)   Past Surgical History  Procedure Laterality Date  . Mri neck  3/00    C4-C5 herniation small stenosis mild C4-5, C5-6  . Melanoma excision Right 1995    "side"  . Carotid US  9/05    Mild, recheck 1 year  . Carotid US  10/06    Normal  . Esophagogastroduodenoscopy  9/07    Normal  . Mole removal      "several; all over my body"  . Hysteroscopy with resectoscope  2000    POLPECTOMY'S  . Tubal ligation  1989  . Tonsillectomy    . Transthoracic echocardiogram  06-29-2010    NORMAL LVSF, EF 55-60%,  MILD MR  . Knee arthroscopy  10/23/2011    Procedure: ARTHROSCOPY KNEE;  Surgeon: Gearlean Alf, MD;  Location: [facility];  Service: Orthopedics;  Laterality: Left;  LEFT KNEE SCOPE WITH DEBRIDEMENT   . Chondroplasty  10/23/2011    Procedure: CHONDROPLASTY;  Surgeon: Gearlean Alf, MD;  Location: [facility];  Service: Orthopedics;  Laterality: Left;  Left medial   . Vaginal hysterectomy  2001    complex hyperplasia with mother's history of uterine cancer  . Total knee arthroplasty Left 11/21/2014    Procedure: LEFT TOTAL KNEE ARTHROPLASTY;  Surgeon: Gearlean Alf, MD;  Location: [facility];  Service: Orthopedics;  Laterality: Left;  . Joint replacement    . Mohs surgery Left     "side of my nose"   Family History  Problem Relation Age of Onset  . Hypertension Mother   . Cancer Mother     uterine  . Obesity Mother   . Thrombocytopenia Mother   . Diabetes Mother   . Alcohol abuse Father   . Heart disease Father     CAD in 51's  . Emphysema Father   . Heart disease Paternal Aunt     CAD  . Heart disease Paternal Uncle     CAD  . Colon cancer Neg Hx   . Diabetes Maternal Grandmother     type 2  . Diabetes Maternal Grandfather     type 2   History  Substance Use Topics  . Smoking status: Former Smoker -- 1.00 packs/day for 15 years     Types: Cigarettes    Quit date: 10/07/1978  . Smokeless tobacco: Never Used  . Alcohol Use: No   OB History    Gravida Para Term Preterm AB TAB SAB Ectopic Multiple Living   3 3        3      Review of Systems  Constitutional: Negative for fever and diaphoresis (feel hot/flushed).  HENT: Negative for sore throat.   Eyes: Negative for visual disturbance.  Respiratory: Negative for cough and shortness of breath.   Cardiovascular: Positive for chest pain. Negative for syncope.  Gastrointestinal: Positive for nausea. Negative for vomiting and abdominal pain.  Genitourinary: Negative for difficulty urinating.  Musculoskeletal: Negative for back pain and neck pain.  Skin: Negative for rash.  Neurological: Negative for syncope and headaches.      Allergies  Amoxicillin; Codeine; and Clarithromycin  Home Medications   Prior to Admission medications   Medication Sig Start Date End Date Taking? Authorizing Provider  Ascorbic Acid (VITAMIN C PO) Take 1 tablet by mouth daily.   Yes Historical Provider, MD  CALCIUM CARBONATE PO Take 1 tablet by mouth daily.   Yes Historical Provider, MD  Cholecalciferol (VITAMIN D PO) Take 1 tablet by mouth daily.   Yes Historical Provider, MD  CINNAMON PO Take 1 capsule by mouth. Takes occasionally   Yes Historical Provider, MD  CYANOCOBALAMIN PO Take 1 tablet by mouth daily.   Yes Historical Provider, MD  KRILL OIL PO Take by mouth. Takes 1 tab occasionally   Yes Historical Provider, MD  magnesium gluconate (MAGONATE) 500 MG tablet Take 500 mg by mouth daily.   Yes Historical Provider, MD  Multiple Vitamin (MULTIVITAMIN) capsule Take 1 capsule by mouth daily.   Yes Historical Provider, MD  Polyethyl Glycol-Propyl Glycol (SYSTANE OP) Apply 1 drop to eye daily as needed (Dry eyes).   Yes Historical Provider, MD  acyclovir cream (ZOVIRAX) 5 % APPLY DAILY AS NEEDED 01/16/15   Abner Greenspan, MD  hydrocortisone (ANUSOL-HC) 2.5 % rectal cream Place 1  application rectally as needed. Apply to affected area once daily as needed for hemorrhoids. 06/14/13   Abner Greenspan, MD  methocarbamol (ROBAXIN) 500 MG tablet Take 1 tablet (500 mg total) by mouth every 6 (six) hours as needed for muscle spasms. 11/22/14   Arlee Muslim, PA-C  oxyCODONE (OXY IR/ROXICODONE) 5 MG immediate release tablet Take 1-2 tablets (5-10 mg total) by mouth every 3 (three) hours as needed for moderate pain, severe pain or breakthrough pain.  11/22/14   Arlee Muslim, PA-C  rivaroxaban (XARELTO) 10 MG TABS tablet Take 1 tablet (10 mg total) by mouth daily with breakfast. Take Xarelto for two and a half more weeks, then discontinue Xarelto. Once the patient has completed the Xarelto, they may resume the 81 mg Aspirin. 11/22/14   Arlee Muslim, PA-C  traMADol (ULTRAM) 50 MG tablet Take 1-2 tablets (50-100 mg total) by mouth every 6 (six) hours as needed (mild pain). 11/22/14   Arlee Muslim, PA-C   BP 113/56 mmHg  Pulse 70  Temp(Src) 98.2 F (36.8 C) (Oral)  Resp 18  Ht 5\' 5"  (1.651 m)  Wt 175 lb (79.379 kg)  BMI 29.12 kg/m2  SpO2 97% Physical Exam  Constitutional: She is oriented to person, place, and time. She appears well-developed and well-nourished. No distress.  HENT:  Head: Normocephalic and atraumatic.  Eyes: Conjunctivae and EOM are normal.  Neck: Normal range of motion.  Cardiovascular: Normal rate, regular rhythm, normal heart sounds and intact distal pulses.  Exam reveals no gallop and no friction rub.   No murmur heard. Pulmonary/Chest: Effort normal and breath sounds normal. No respiratory distress. She has no wheezes. She has no rales.  Abdominal: Soft. She exhibits no distension. There is no tenderness. There is no guarding.  Musculoskeletal: She exhibits no edema or tenderness.  Neurological: She is alert and oriented to person, place, and time.  Skin: Skin is warm and dry. No rash noted. She is not diaphoretic. No erythema.  Nursing note and vitals  reviewed.   ED Course  Procedures (including critical care time) Labs Review Labs Reviewed  BASIC METABOLIC PANEL - Abnormal; Notable for the following:    Calcium 10.4 (*)    All other components within normal limits  CBC - Abnormal; Notable for the following:    Hemoglobin 15.6 (*)    All other components within normal limits  TROPONIN I  COMPREHENSIVE METABOLIC PANEL  CBC  TROPONIN I  LIPID PANEL  TROPONIN I  Randolm Idol, ED    Imaging Review Dg Chest 2 View  05/10/2015   CLINICAL DATA:  Chest pain. Initial encounter. Chest pain radiating to the LEFT shoulder and back.  EXAM: CHEST  2 VIEW  COMPARISON:  06/27/2009.  06/28/2010.  FINDINGS: Cardiopericardial silhouette within normal limits. Mediastinal contours normal. Trachea midline. No airspace disease or effusion. Dextroconvex thoracic scoliosis.  IMPRESSION: No active cardiopulmonary disease.   Electronically Signed   By: Dereck Ligas M.D.   On: 05/10/2015 16:18     EKG Interpretation   Date/Time:  Wednesday May 10 2015 14:47:51 EDT Ventricular Rate:  69 PR Interval:  174 QRS Duration: 78 QT Interval:  388 QTC Calculation: 415 R Axis:   81 Text Interpretation:  Sinus rhythm with occasional Premature ventricular  complexes Otherwise normal ECG Premature ventricular complexes are new  from prior No other significant changes Confirmed by [facility] MD, Ofilia Rayon  ([id number]) on 05/10/2015 3:46:40 PM      MDM   Final diagnoses:  Chest pain, unspecified chest pain type   64 year old female with history of family hx of CAD, possible hypercholesterolemia per chart (pt denies), presents with concern of chest pain. Differential diagnosis for chest pain includes pulmonary embolus, dissection, pneumothorax, pneumonia, ACS, myocarditis, pericarditis.  EKG was done and evaluate by me and showed no acute ST changes and no signs of pericarditis. Chest x-ray was done and evaluated by me and radiology and showed  no sign of  pneumonia or pneumothorax. Patient  is low risk [place] and have low suspicion for PE by history.  Patient is high risk HEART score and had troponin which was negative. Given this evaluation, history and physical have low suspicion for pulmonary embolus, pneumonia, pericarditis, dissection.  High risk HEART score (5) with exertional chest pressure and nausea concerning for angina.  Pt to be admitted to obs for continued care.      Gareth Morgan, MD 05/11/15 [phone]

## 2015-05-10 NOTE — ED Notes (Signed)
Cardiology paged to [phone].

## 2015-05-10 NOTE — Telephone Encounter (Signed)
Patient Name: [name] [name] DOB: 1951-08-07 Initial Comment [name] having pain on left shoulder Nurse Assessment Nurse: [name] Sa, RN, [name] [name] Date/Time (Eastern Time): 05/10/2015 1:14:58 PM Confirm and document reason for call. If symptomatic, describe symptoms. ---[name] states she developed left shoulder and chest pain yesterday. No injury in the past 3 days. No severe breathing difficulty. Has the patient traveled out of the country within the last 30 days? ---No Does the patient require triage? ---Yes Related visit to physician within the last 2 weeks? ---Yes Does the PT have any chronic conditions? (i.e. diabetes, asthma, etc.) ---Yes List chronic conditions. ---"Heart problems" Guidelines Guideline Title Affirmed Question Affirmed Notes Chest Pain [1] Chest pain lasts > 5 minutes AND [2] history of heart disease  Final Disposition User Call EMS 911 Now [name] Sa, RN, [name] [name] Referrals GO TO FACILITY UNDECIDED Disagree/Comply: Disagree Disagree/Comply Reason: Disagree with instructions [name] [name] plans to call her husband to determine which ER they will go to.

## 2015-05-10 NOTE — Telephone Encounter (Signed)
Pt is in ED now.

## 2015-05-10 NOTE — ED Notes (Signed)
Pt here with chest pain radiating into left shoulder and back since yesterday. sts constant.

## 2015-05-10 NOTE — Progress Notes (Signed)
Pt received from Ed to rm 2w05, alert and oriented, vitals stable, denies pain. Report called to Sherlynn Stalls from [place], RN, pt oriented to the unit. Will continue to monitor pt.

## 2015-05-10 NOTE — H&P (Signed)
[facility] History and Physical  [name] [name] OHY:[id number] DOB: Jun 28, 1951 DOA: 05/10/2015  Referring physician: Emergency Department PCP: [name], MD  Specialists:   Chief Complaint: Chest pain  HPI: [name] [name] is a 64 y.o. female  With a family hx of heart disease, HLD, who presented to the ED with L sided chest pain with radiation to the L shoulder. EKG was unremarkable and initial troponin was neg. Sx were associated with nausea. In the ED, symptoms improved. Given presentation, hospitalist consulted for consideration for admission.   Review of Systems:  Review of Systems  Constitutional: Negative for fever and chills.  HENT: Negative for ear pain and tinnitus.   Eyes: Negative for pain and discharge.  Respiratory: Negative for hemoptysis, shortness of breath and stridor.   Cardiovascular: Positive for chest pain. Negative for PND.  Gastrointestinal: Positive for nausea. Negative for abdominal pain.  Genitourinary: Negative for frequency and hematuria.  Musculoskeletal: Negative for back pain, joint pain and falls.  Neurological: Negative for tingling, tremors and loss of consciousness.  Psychiatric/Behavioral: Negative for hallucinations and memory loss.     Past Medical History  Diagnosis Date  . Melanoma of skin   . Basal cell cancer 5/08  . Hyperlipidemia DIET CONTROL  . GERD (gastroesophageal reflux disease)     HH  . Eczema   . Chest pain 9/11    [facility]) and palpitations - ruled out for MI with nl. Echo  . Heart palpitations OCCASIONAL--  TAKES ATENOLOL PRN  . Normal nuclear stress test 07-09-10  . Arthritis     NECK  . Complication of anesthesia HARD TO WAKE  . Hiatal hernia   . Osteopenia 12/2013    T score -1.6 FRAX 16%/0.8%  . HSV-2 infection     buttocks  . Breast cyst   . PONV (postoperative nausea and vomiting)   . MVP (mitral valve prolapse)     per pt no treatment needed  . Difficulty sleeping   . Acute meniscal tear of knee     Past Surgical History  Procedure Laterality Date  . Mri neck  3/00    C4-C5 herniation small stenosis mild C4-5, C5-6  . Melanoma excision      TRUNK  . Carotid US  9/05    Mild, recheck 1 year  . Carotid US  10/06    Normal  . Esophagogastroduodenoscopy  9/07    Normal  . Abnormal mole removed from leg - ? basal cell    . Hysteroscopy with resectoscope  2000    POLPECTOMY'S  . Tubal ligation  23 YRS AGO  . Tonsillectomy    . Transthoracic echocardiogram  06-29-2010    NORMAL LVSF, EF 55-60%,  MILD MR  . Knee arthroscopy  10/23/2011    Procedure: ARTHROSCOPY KNEE;  Surgeon: Gearlean Alf, MD;  Location: [facility];  Service: Orthopedics;  Laterality: Left;  LEFT KNEE SCOPE WITH DEBRIDEMENT   . Chondroplasty  10/23/2011    Procedure: CHONDROPLASTY;  Surgeon: Gearlean Alf, MD;  Location: [facility];  Service: Orthopedics;  Laterality: Left;  Left medial   . Vaginal hysterectomy  2001    complex hyperplasia with mother's history of uterine cancer  . Total knee arthroplasty Left 11/21/2014    Procedure: LEFT TOTAL KNEE ARTHROPLASTY;  Surgeon: Gearlean Alf, MD;  Location: [facility];  Service: Orthopedics;  Laterality: Left;   Social History:  reports that she quit smoking about 37 years ago.  Her smoking use included Cigarettes. She has never used smokeless tobacco. She reports that she does not drink alcohol or use illicit drugs.  where does patient live--home, ALF, SNF? and with whom if at home?  Can patient participate in ADLs?  Allergies  Allergen Reactions  . Amoxicillin Rash  . Codeine Rash  . Clarithromycin Other (See Comments)    REACTION: reaction not known    Family History  Problem Relation Age of Onset  . Hypertension Mother   . Cancer Mother     uterine  . Obesity Mother   . Thrombocytopenia Mother   . Diabetes Mother   . Alcohol abuse Father   . Heart disease Father     CAD in 28's  . Emphysema Father   . Heart disease  Paternal Aunt     CAD  . Heart disease Paternal Uncle     CAD  . Colon cancer Neg Hx   . Diabetes Maternal Grandmother     type 2  . Diabetes Maternal Grandfather     type 2    (be sure to complete)  Prior to Admission medications   Medication Sig Start Date End Date Taking? Authorizing Provider  Ascorbic Acid (VITAMIN C PO) Take 1 tablet by mouth daily.   Yes Historical Provider, MD  CALCIUM CARBONATE PO Take 1 tablet by mouth daily.   Yes Historical Provider, MD  Cholecalciferol (VITAMIN D PO) Take 1 tablet by mouth daily.   Yes Historical Provider, MD  CINNAMON PO Take 1 capsule by mouth. Takes occasionally   Yes Historical Provider, MD  CYANOCOBALAMIN PO Take 1 tablet by mouth daily.   Yes Historical Provider, MD  KRILL OIL PO Take by mouth. Takes 1 tab occasionally   Yes Historical Provider, MD  magnesium gluconate (MAGONATE) 500 MG tablet Take 500 mg by mouth daily.   Yes Historical Provider, MD  Multiple Vitamin (MULTIVITAMIN) capsule Take 1 capsule by mouth daily.   Yes Historical Provider, MD  Polyethyl Glycol-Propyl Glycol (SYSTANE OP) Apply 1 drop to eye daily as needed (Dry eyes).   Yes Historical Provider, MD  acyclovir cream (ZOVIRAX) 5 % APPLY DAILY AS NEEDED 01/16/15   Abner Greenspan, MD  hydrocortisone (ANUSOL-HC) 2.5 % rectal cream Place 1 application rectally as needed. Apply to affected area once daily as needed for hemorrhoids. 06/14/13   Abner Greenspan, MD  methocarbamol (ROBAXIN) 500 MG tablet Take 1 tablet (500 mg total) by mouth every 6 (six) hours as needed for muscle spasms. 11/22/14   Arlee Muslim, PA-C  oxyCODONE (OXY IR/ROXICODONE) 5 MG immediate release tablet Take 1-2 tablets (5-10 mg total) by mouth every 3 (three) hours as needed for moderate pain, severe pain or breakthrough pain. 11/22/14   Arlee Muslim, PA-C  rivaroxaban (XARELTO) 10 MG TABS tablet Take 1 tablet (10 mg total) by mouth daily with breakfast. Take Xarelto for two and a half more weeks, then  discontinue Xarelto. Once the patient has completed the Xarelto, they may resume the 81 mg Aspirin. 11/22/14   Arlee Muslim, PA-C  traMADol (ULTRAM) 50 MG tablet Take 1-2 tablets (50-100 mg total) by mouth every 6 (six) hours as needed (mild pain). 11/22/14   Arlee Muslim, PA-C   Physical Exam: Filed Vitals:   05/10/15 1450 05/10/15 1627  BP: 134/82 129/77  Pulse: 67 61  Temp: 98 F (36.7 C)   TempSrc: Oral   Resp: 18 18  SpO2: 99% 99%     General:  Awake, in nad  Eyes: PERRL B  ENT: membranes moist, dentition fair  Neck: trachea midline, neck supple  Cardiovascular: regular, s1, s2  Respiratory: normal resp effort, no wheezing  Abdomen: soft,nondistended  Skin: normal skin turgor, no abnormal skin lesions seen  Musculoskeletal: perfused, no clubbing  Psychiatric: mood/affect normal/ no auditory/visual hallucinations  Neurologic: cn2-12 grossly intact, strength/sensation intact  Labs on Admission:  Basic Metabolic Panel:  Recent Labs Lab 05/10/15 1512  NA 139  K 4.1  CL 103  CO2 26  GLUCOSE 97  BUN 14  CREATININE 0.85  CALCIUM 10.4*   Liver Function Tests: No results for input(s): AST, ALT, ALKPHOS, BILITOT, PROT, ALBUMIN in the last 168 hours. No results for input(s): LIPASE, AMYLASE in the last 168 hours. No results for input(s): AMMONIA in the last 168 hours. CBC:  Recent Labs Lab 05/10/15 1512  WBC 6.3  HGB 15.6*  HCT 44.6  MCV 93.5  PLT 200   Cardiac Enzymes: No results for input(s): CKTOTAL, CKMB, CKMBINDEX, TROPONINI in the last 168 hours.  BNP (last 3 results) No results for input(s): BNP in the last 8760 hours.  ProBNP (last 3 results) No results for input(s): PROBNP in the last 8760 hours.  CBG: No results for input(s): GLUCAP in the last 168 hours.  Radiological Exams on Admission: Dg Chest 2 View  05/10/2015   CLINICAL DATA:  Chest pain. Initial encounter. Chest pain radiating to the LEFT shoulder and back.  EXAM: CHEST  2  VIEW  COMPARISON:  06/27/2009.  06/28/2010.  FINDINGS: Cardiopericardial silhouette within normal limits. Mediastinal contours normal. Trachea midline. No airspace disease or effusion. Dextroconvex thoracic scoliosis.  IMPRESSION: No active cardiopulmonary disease.   Electronically Signed   By: Dereck Ligas M.D.   On: 05/10/2015 16:18    EKG: Independently reviewed. NSR  Assessment/Plan Principal Problem:   Chest pain Active Problems:   HYPERCHOLESTEROLEMIA, PURE   OA (osteoarthritis) of knee   1. Chest pain 1. Calculated HEART score 3-4 2. Presently asymptomatic 3. Will follow serial troponin and check 2d echo to r/o WMA 4. Check LFT's 5. Admit to med-tele obs 6. Cont on analgesics and PRN nitroglycerin 2. HLD 1. Not on statin prior to admit 2. Will check lipid in am 3. Osteoarthritis 1. Stable 2. Analgesics as tolerated 4. DVT prophylaxis 1. Lovenox subQ  Code Status: Full  Family Communication: Pt in room Disposition Plan: Admit med-tele obs   [name] [name], [facility] Pager [phone]  If 7PM-7AM, please contact night-coverage [email or web address] Password [facility] 05/10/2015, 4:56 PM

## 2015-05-10 NOTE — Telephone Encounter (Signed)
PLEASE NOTE: All timestamps contained within this report are represented as [place] Standard Time. CONFIDENTIALTY NOTICE: This fax transmission is intended only for the addressee. It contains information that is legally privileged, confidential or otherwise protected from use or disclosure. If you are not the intended recipient, you are strictly prohibited from reviewing, disclosing, copying using or disseminating any of this information or taking any action in reliance on or regarding this information. If you have received this fax in error, please notify us immediately by telephone so that we can arrange for its return to [place]. Phone: [phone], Toll-Free: [phone], Fax: [phone] Page: 1 of 2 Call Id: [id number] [place] Patient Name: [name] [name] Gender: Female DOB: 1951-04-19 Age: 64 Y 55 M 12 D Return Phone Number: [phone] (Primary) Address: City/State/Zip: [place] Client [facility] - Client Client Site [facility], [place] Contact Type Call Call Type Triage / Clinical Relationship To Patient Self Appointment Disposition EMR Appointment Not Necessary Info pasted into Epic Yes Return Phone Number [phone] (Primary) Chief Complaint Chest Pain (non urgent symptoms) Initial Comment [name] having pain on left shoulder PreDisposition Call Doctor Nurse Assessment Nurse: Vallery Sa, RN, Tye [place] Date/Time Eilene Ghazi Time): 05/10/2015 1:14:58 PM Confirm and document reason for call. If symptomatic, describe symptoms. ---[name] states she developed left shoulder and chest pain yesterday. No injury in the past 3 days. No severe breathing difficulty. Has the patient traveled out of the country within the last 30 days? ---No Does the patient require triage? ---Yes Related visit to physician within the last 2 weeks?  ---Yes Does the PT have any chronic conditions? (i.e. diabetes, asthma, etc.) ---Yes List chronic conditions. ---Hearts problems Guidelines Guideline Title Affirmed Question Affirmed Notes Nurse Date/Time Eilene Ghazi Time) Chest Pain [1] Chest pain lasts > 5 minutes AND [2] history of heart disease (i.e., heart attack, bypass surgery, angina, angioplasty, CHF; not just a heart murmur) Trumbull, RN, Cathy 05/10/2015 1:17:53 PM Disp. Time Eilene Ghazi Time) Disposition Final User 05/10/2015 1:22:37 PM 911 Outcome Documentation Trumbull, RN, Tye [place] Reason: Shayanna declined the Call 911 disposition. Reinforced the Call 911 disposition. She plans to call her husband at work to decide which ER to go to. 05/10/2015 1:20:39 PM Call EMS 911 Now Yes Trumbull, RN, Tye [place] PLEASE NOTE: All timestamps contained within this report are represented as [place] Standard Time. CONFIDENTIALTY NOTICE: This fax transmission is intended only for the addressee. It contains information that is legally privileged, confidential or otherwise protected from use or disclosure. If you are not the intended recipient, you are strictly prohibited from reviewing, disclosing, copying using or disseminating any of this information or taking any action in reliance on or regarding this information. If you have received this fax in error, please notify us immediately by telephone so that we can arrange for its return to [place]. Phone: [phone], Toll-Free: [phone], Fax: [phone] Page: 2 of 2 Call Id: [id number] [name] Understands: Yes Disagree/Comply: Disagree Disagree/Comply Reason: Disagree with instructions Care Advice Given Per Guideline CALL EMS 911 NOW: Immediate medical attention is needed. You need to hang up and call 911 (or an ambulance). Psychologist, forensic Discretion: I'll call you back in a few minutes to be sure you were able to reach them.) CARE ADVICE given per Chest Pain (Adult) guideline. After Care Instructions Given Call  Event Type User Date / Time Description Referrals GO TO FACILITY UNDECIDED

## 2015-05-11 ENCOUNTER — Observation Stay (HOSPITAL_COMMUNITY): Payer: Managed Care, Other (non HMO)

## 2015-05-11 ENCOUNTER — Other Ambulatory Visit: Payer: Self-pay | Admitting: Physician Assistant

## 2015-05-11 DIAGNOSIS — M1710 Unilateral primary osteoarthritis, unspecified knee: Secondary | ICD-10-CM

## 2015-05-11 DIAGNOSIS — R079 Chest pain, unspecified: Secondary | ICD-10-CM | POA: Diagnosis not present

## 2015-05-11 DIAGNOSIS — R0789 Other chest pain: Secondary | ICD-10-CM | POA: Diagnosis not present

## 2015-05-11 DIAGNOSIS — R931 Abnormal findings on diagnostic imaging of heart and coronary circulation: Secondary | ICD-10-CM

## 2015-05-11 DIAGNOSIS — E780 Pure hypercholesterolemia: Secondary | ICD-10-CM

## 2015-05-11 LAB — TROPONIN I
Troponin I: <0.03 ng/mL (ref <0.031)
Troponin I: <0.03 ng/mL (ref <0.031)

## 2015-05-11 LAB — LIPID PANEL
CHOLESTEROL: 189 mg/dL (ref 0–200)
HDL: 45 mg/dL (ref >40)
LDL CALC: 126 mg/dL — AB (ref 0–99)
TRIGLYCERIDES: 92 mg/dL (ref <150)
Total CHOL/HDL Ratio: 4.2 RATIO
VLDL: 18 mg/dL (ref 0–40)

## 2015-05-11 LAB — COMPREHENSIVE METABOLIC PANEL
ALBUMIN: 3.5 g/dL (ref 3.5–5.0)
ALT: 16 U/L (ref 14–54)
AST: 21 U/L (ref 15–41)
Alkaline Phosphatase: 62 U/L (ref 38–126)
Anion gap: 8 (ref 5–15)
BILIRUBIN TOTAL: 0.5 mg/dL (ref 0.3–1.2)
BUN: 16 mg/dL (ref 6–20)
CALCIUM: 9.6 mg/dL (ref 8.9–10.3)
CO2: 26 mmol/L (ref 22–32)
CREATININE: 0.83 mg/dL (ref 0.44–1.00)
Chloride: 106 mmol/L (ref 101–111)
GFR calc Af Amer: >60 mL/min (ref >60)
GFR calc non Af Amer: >60 mL/min (ref >60)
Glucose, Bld: 96 mg/dL (ref 65–99)
POTASSIUM: 4 mmol/L (ref 3.5–5.1)
Sodium: 140 mmol/L (ref 135–145)
Total Protein: 5.9 g/dL — ABNORMAL LOW (ref 6.5–8.1)

## 2015-05-11 LAB — CBC
HEMATOCRIT: 40.8 % (ref 36.0–46.0)
Hemoglobin: 14.2 g/dL (ref 12.0–15.0)
MCH: 32.3 pg (ref 26.0–34.0)
MCHC: 34.8 g/dL (ref 30.0–36.0)
MCV: 92.9 fL (ref 78.0–100.0)
Platelets: 176 10*3/uL (ref 150–400)
RBC: 4.39 MIL/uL (ref 3.87–5.11)
RDW: 13.4 % (ref 11.5–15.5)
WBC: 5 10*3/uL (ref 4.0–10.5)

## 2015-05-11 MED ORDER — ASPIRIN EC 81 MG PO TBEC: 81.0000 mg | DELAYED_RELEASE_TABLET | Freq: Every day | ORAL | Status: DC

## 2015-05-11 NOTE — Discharge Summary (Signed)
Physician Discharge Summary  [name] [name] JJK:[id number] DOB: March 16, 1951 DOA: 05/10/2015  PCP: [name], MD  Admit date: 05/10/2015 Discharge date: 05/11/2015  Time spent: 20 minutes  Recommendations for Outpatient Follow-up:  1. Follow up with PCP in 1-2 weeks 2. Follow up for outpatient stress test  Discharge Diagnoses:  Principal Problem:   Chest pain Active Problems:   HYPERCHOLESTEROLEMIA, PURE   OA (osteoarthritis) of knee   Discharge Condition: Stable  Diet recommendation: Heart healthy  Filed Weights   05/10/15 1754  Weight: 79.379 kg (175 lb)    History of present illness:  Please see admit h and p from 8/3 for details. Briefly, pt presented with chest pains. Pt was admitted for further workup.  Hospital Course:  Chest pain 1. Calculated HEART score 3-4 2. Pt remained asymptomatic 3. Serial troponin neg x3 4. Pt was cont on analgesics and PRN nitroglycerin 5. 2d echo with findings for questionable hypokinesis involving L Cx distribution in a background of PVC's  6. Have consulted Cardiology with recommendations for outpatient stress test. 7. Pt was considered low risk and was cleared for d/c home 2. HLD 1. Not on statin prior to admit 2. LDL of 126 3. Osteoarthritis 1. Stable 2. Continued analgesics as tolerated 4. DVT prophylaxis 1. Lovenox subQ while inpatient  Procedures:  2d echo 8/4  Consultations:  Cardiology  Discharge Exam: Filed Vitals:   05/10/15 1754 05/10/15 2036 05/11/15 0502 05/11/15 1300  BP: 125/71 113/56 109/68 110/62  Pulse: 62 70 65 74  Temp:  98.2 F (36.8 C) 98 F (36.7 C) 97.9 F (36.6 C)  TempSrc:  Oral Oral Oral  Resp: 18 18 18 18   Height: 5\' 5"  (1.651 m)     Weight: 79.379 kg (175 lb)     SpO2: 96% 97% 96% 96%    General: awake, in nad Cardiovascular: regular, s1, s2 Respiratory: normal resp effort, no wheezing  Discharge Instructions     Medication List    STOP taking these medications         acyclovir cream 5 %  Commonly known as:  ZOVIRAX     hydrocortisone 2.5 % rectal cream  Commonly known as:  ANUSOL-HC     rivaroxaban 10 MG Tabs tablet  Commonly known as:  XARELTO     traMADol 50 MG tablet  Commonly known as:  ULTRAM      TAKE these medications        aspirin EC 81 MG tablet  Take 1 tablet (81 mg total) by mouth daily.     CALCIUM CARBONATE PO  Take 1 tablet by mouth daily.     CINNAMON PO  Take 1 capsule by mouth. Takes occasionally     CYANOCOBALAMIN PO  Take 1 tablet by mouth daily.     KRILL OIL PO  Take by mouth. Takes 1 tab occasionally     magnesium gluconate 500 MG tablet  Commonly known as:  MAGONATE  Take 500 mg by mouth daily.     methocarbamol 500 MG tablet  Commonly known as:  ROBAXIN  Take 1 tablet (500 mg total) by mouth every 6 (six) hours as needed for muscle spasms.     multivitamin capsule  Take 1 capsule by mouth daily.     oxyCODONE 5 MG immediate release tablet  Commonly known as:  Oxy IR/ROXICODONE  Take 1-2 tablets (5-10 mg total) by mouth every 3 (three) hours as needed for moderate pain, severe pain or breakthrough pain.  SYSTANE OP  Apply 1 drop to eye daily as needed (Dry eyes).     VITAMIN C PO  Take 1 tablet by mouth daily.     VITAMIN D PO  Take 1 tablet by mouth daily.       Allergies  Allergen Reactions  . Amoxicillin Rash  . Codeine Rash  . Clarithromycin Other (See Comments)    REACTION: reaction not known   Follow-up Information    Follow up with [name], MD. Schedule an appointment as soon as possible for a visit in 1 week.   Specialties:  Family Medicine, Radiology   Why:  Hospital follow up   Contact information:   [address] [place] [address] [phone]       Please follow up.   Why:  Cardiology will contact you to arrange outpatient stress test       The results of significant diagnostics from this hospitalization (including  imaging, microbiology, ancillary and laboratory) are listed below for reference.    Significant Diagnostic Studies: Dg Chest 2 View  05/10/2015   CLINICAL DATA:  Chest pain. Initial encounter. Chest pain radiating to the LEFT shoulder and back.  EXAM: CHEST  2 VIEW  COMPARISON:  06/27/2009.  06/28/2010.  FINDINGS: Cardiopericardial silhouette within normal limits. Mediastinal contours normal. Trachea midline. No airspace disease or effusion. Dextroconvex thoracic scoliosis.  IMPRESSION: No active cardiopulmonary disease.   Electronically Signed   By: Dereck Ligas M.D.   On: 05/10/2015 16:18   Ct Abdomen Pelvis W Contrast  04/20/2015   CLINICAL DATA:  Right lower quadrant abdominal pain for 1 week  EXAM: CT ABDOMEN AND PELVIS WITH CONTRAST  TECHNIQUE: Multidetector CT imaging of the abdomen and pelvis was performed using the standard protocol following bolus administration of intravenous contrast.  CONTRAST:  120mL OMNIPAQUE IOHEXOL 300 MG/ML  SOLN  COMPARISON:  CT abdomen pelvis of 11/13/2010  FINDINGS: Only mild bibasilar dependent atelectasis is present. The liver enhances with only tiny low-attenuation structures consistent with cysts which appear stable compared to the prior CT. No calcified gallstones are seen. The pancreas is normal in size and the pancreatic duct is not dilated. The adrenal glands and spleen are unremarkable. The stomach is not well distended. The kidneys enhance with no calculus or mass and no hydronephrosis is seen. On delayed images, the pelvocaliceal systems are unremarkable, and the proximal ureters are normal in caliber. The abdominal aorta is normal in caliber although somewhat ectatic.  The urinary bladder is not well distended but no gross abnormality is evident. The uterus appears to have been resected. No adnexal lesion is seen. No fluid is noted within the pelvis. There is feces throughout the colon. The terminal ileum is unremarkable. The appendix courses retrocecal and  is unremarkable. There is no evidence of appendicitis. No inflammatory process within the right lower quadrant is seen. There is a lumbar scoliosis convex to the left with associated degenerative change. Degenerative disc disease is noted primarily at L2-3, L3-4, and L4-5 levels.  IMPRESSION: 1. The appendix and terminal ileum appear normal. No evidence of appendicitis is seen. No inflammatory process is noted. 2. No renal calculi are seen. 3. Lumbar scoliosis with diffuse degenerative disc disease.   Electronically Signed   By: Ivar Drape M.D.   On: 04/20/2015 11:10    Microbiology: No results found for this or any previous visit (from the past 240 hour(s)).   Labs: Basic Metabolic Panel:  Recent Labs Lab 05/10/15 1512 05/11/15 0259  NA 139 140  K 4.1 4.0  CL 103 106  CO2 26 26  GLUCOSE 97 96  BUN 14 16  CREATININE 0.85 0.83  CALCIUM 10.4* 9.6   Liver Function Tests:  Recent Labs Lab 05/11/15 0259  AST 21  ALT 16  ALKPHOS 62  BILITOT 0.5  PROT 5.9*  ALBUMIN 3.5   No results for input(s): LIPASE, AMYLASE in the last 168 hours. No results for input(s): AMMONIA in the last 168 hours. CBC:  Recent Labs Lab 05/10/15 1512 05/11/15 0259  WBC 6.3 5.0  HGB 15.6* 14.2  HCT 44.6 40.8  MCV 93.5 92.9  PLT 200 176   Cardiac Enzymes:  Recent Labs Lab 05/10/15 1934 05/11/15 0259 05/11/15 0723  TROPONINI <0.03 <0.03 <0.03   BNP: BNP (last 3 results) No results for input(s): BNP in the last 8760 hours.  ProBNP (last 3 results) No results for input(s): PROBNP in the last 8760 hours.  CBG: No results for input(s): GLUCAP in the last 168 hours.   Signed:  Marina Boerner, Orpah Melter  [facility] 05/11/2015, 7:28 PM

## 2015-05-11 NOTE — Progress Notes (Signed)
  Echocardiogram 2D Echocardiogram has been performed.  [name] [name] 05/11/2015, 12:49 PM

## 2015-05-11 NOTE — Progress Notes (Signed)
[facility] PROGRESS NOTE  LACHINA SALSBERRY VHQ:[id number] DOB: 05/04/1951 DOA: 05/10/2015 PCP: [name], MD  Assessment/Plan: 1. Chest pain 1. Calculated HEART score 3-4 2. Presently asymptomatic 3. Will follow serial troponin and check 2d echo to r/o WMA 4. Cont on analgesics and PRN nitroglycerin 5. 2d echo with findings concerning for hypokinesis involving L Cx distribution 6. Have consulted Cardiology for assistance 2. HLD 1. Not on statin prior to admit 2. LDL of 126 3. Osteoarthritis 1. Stable 2. Analgesics as tolerated 4. DVT prophylaxis 1. Lovenox subQ  Code Status: Full Family Communication: Pt in room, Husband at bedside (indicate person spoken with, relationship, and if by phone, the number) Disposition Plan: Pending   Consultants:  Cardiology  Procedures:    Antibiotics:   (indicate start date, and stop date if known)  HPI/Subjective: Denies chest pain. Concerned about 2d echo findings  Objective: Filed Vitals:   05/10/15 1754 05/10/15 2036 05/11/15 0502 05/11/15 1300  BP: 125/71 113/56 109/68 110/62  Pulse: 62 70 65 74  Temp:  98.2 F (36.8 C) 98 F (36.7 C) 97.9 F (36.6 C)  TempSrc:  Oral Oral Oral  Resp: 18 18 18 18   Height: 5\' 5"  (1.651 m)     Weight: 79.379 kg (175 lb)     SpO2: 96% 97% 96% 96%    Intake/Output Summary (Last 24 hours) at 05/11/15 1625 Last data filed at 05/11/15 1230  Gross per 24 hour  Intake    462 ml  Output      0 ml  Net    462 ml   Filed Weights   05/10/15 1754  Weight: 79.379 kg (175 lb)    Exam:   General:  Awake, in nad  Cardiovascular: regular, s1, s2  Respiratory: normal resp effort, no wheezing  Abdomen: soft,nondistended  Musculoskeletal: perfused, no clubbing   Data Reviewed: Basic Metabolic Panel:  Recent Labs Lab 05/10/15 1512 05/11/15 0259  NA 139 140  K 4.1 4.0  CL 103 106  CO2 26 26  GLUCOSE 97 96  BUN 14 16  CREATININE 0.85 0.83  CALCIUM 10.4* 9.6   Liver  Function Tests:  Recent Labs Lab 05/11/15 0259  AST 21  ALT 16  ALKPHOS 62  BILITOT 0.5  PROT 5.9*  ALBUMIN 3.5   No results for input(s): LIPASE, AMYLASE in the last 168 hours. No results for input(s): AMMONIA in the last 168 hours. CBC:  Recent Labs Lab 05/10/15 1512 05/11/15 0259  WBC 6.3 5.0  HGB 15.6* 14.2  HCT 44.6 40.8  MCV 93.5 92.9  PLT 200 176   Cardiac Enzymes:  Recent Labs Lab 05/10/15 1934 05/11/15 0259 05/11/15 0723  TROPONINI <0.03 <0.03 <0.03   BNP (last 3 results) No results for input(s): BNP in the last 8760 hours.  ProBNP (last 3 results) No results for input(s): PROBNP in the last 8760 hours.  CBG: No results for input(s): GLUCAP in the last 168 hours.  No results found for this or any previous visit (from the past 240 hour(s)).   Studies: Dg Chest 2 View  05/10/2015   CLINICAL DATA:  Chest pain. Initial encounter. Chest pain radiating to the LEFT shoulder and back.  EXAM: CHEST  2 VIEW  COMPARISON:  06/27/2009.  06/28/2010.  FINDINGS: Cardiopericardial silhouette within normal limits. Mediastinal contours normal. Trachea midline. No airspace disease or effusion. Dextroconvex thoracic scoliosis.  IMPRESSION: No active cardiopulmonary disease.   Electronically Signed   By: Arvilla Market.D.  On: 05/10/2015 16:18    Scheduled Meds: . enoxaparin (LOVENOX) injection  40 mg Subcutaneous Q24H  . sodium chloride  3 mL Intravenous Q12H   Continuous Infusions:   Principal Problem:   Chest pain Active Problems:   HYPERCHOLESTEROLEMIA, PURE   OA (osteoarthritis) of knee   [facility], [facility] Pager [phone]. If 7PM-7AM, please contact night-coverage at [email or web address], password [facility] 05/11/2015, 4:25 PM      \

## 2015-05-11 NOTE — Progress Notes (Signed)
Pt D/C home per MD order, D/C instructions reviewed with pt and spouse, all questions answered. Pt aware of follow up appt., Pt  Instructed to pick up his prescription for aspirin EC 81 mg, iv removed and site looks clean and intact, Pt verbalized understanding of discharged instructions.

## 2015-05-11 NOTE — Consult Note (Signed)
CARDIOLOGY CONSULT NOTE   Patient ID: [name] [name] MRN: [id number] DOB/AGE: [birth year] 64 y.o.  Admit date: 05/10/2015  Primary Physician   [name], MD Primary Cardiologist   New (last seen by Dr. Velora Heckler in 2012) Reason for Consultation   Abnormal echo  HPI: [name] [name] is a 64 y.o. female with a history of chronic palpitations, atypical chest pain, SVT (2005), HL (diet controlled), Basal cell carcinoma, mitral valve prolapse (per patient), GERD who presented 05/10/15 with a atypical chest pain.   The patient had a hx of palpitation, previous been on atenolol. She came to ED with L upper sided chest pain with radiation to the L shoulder. She describes the pain as a achy, worsen with movement that was associated with nausea. She denies SOB, recent palpitations, LE edema, orthopnea or PND. EKG was unremarkable and initial troponin was neg.   She was admitted for observation. Currently no chest pain. Trop x 3 negative. 2D echo with findings concerning for hypokinesis involving L Cx distribution. She was schedule for treadmill stress test today, however got cancelled due to knee issue.   Past Medical History  Diagnosis Date  . Melanoma of skin   . Basal cell cancer 5/08  . Hyperlipidemia DIET CONTROL  . Eczema   . Chest pain 9/11    [facility]) and palpitations - ruled out for MI with nl. Echo  . Heart palpitations OCCASIONAL--  TAKES ATENOLOL PRN  . Normal nuclear stress test 07-09-10  . Complication of anesthesia HARD TO WAKE  . Hiatal hernia   . Osteopenia 12/2013    T score -1.6 FRAX 16%/0.8%  . HSV-2 infection     buttocks  . Breast cyst     "left"  . PONV (postoperative nausea and vomiting)   . MVP (mitral valve prolapse)     per pt no treatment needed  . Difficulty sleeping   . Acute meniscal tear of knee   . GERD (gastroesophageal reflux disease)   . Arthritis     "neck; left knee before replacement" (05/10/2015)     Past Surgical History  Procedure Laterality  Date  . Mri neck  3/00    C4-C5 herniation small stenosis mild C4-5, C5-6  . Melanoma excision Right 1995    "side"  . Carotid US  9/05    Mild, recheck 1 year  . Carotid US  10/06    Normal  . Esophagogastroduodenoscopy  9/07    Normal  . Mole removal      "several; all over my body"  . Hysteroscopy with resectoscope  2000    POLPECTOMY'S  . Tubal ligation  1989  . Tonsillectomy    . Transthoracic echocardiogram  06-29-2010    NORMAL LVSF, EF 55-60%,  MILD MR  . Knee arthroscopy  10/23/2011    Procedure: ARTHROSCOPY KNEE;  Surgeon: Gearlean Alf, MD;  Location: [facility];  Service: Orthopedics;  Laterality: Left;  LEFT KNEE SCOPE WITH DEBRIDEMENT   . Chondroplasty  10/23/2011    Procedure: CHONDROPLASTY;  Surgeon: Gearlean Alf, MD;  Location: [facility];  Service: Orthopedics;  Laterality: Left;  Left medial   . Vaginal hysterectomy  2001    complex hyperplasia with mother's history of uterine cancer  . Total knee arthroplasty Left 11/21/2014    Procedure: LEFT TOTAL KNEE ARTHROPLASTY;  Surgeon: Gearlean Alf, MD;  Location: [facility];  Service: Orthopedics;  Laterality: Left;  . Joint replacement    .  Mohs surgery Left     "side of my nose"    Allergies  Allergen Reactions  . Amoxicillin Rash  . Codeine Rash  . Clarithromycin Other (See Comments)    REACTION: reaction not known    I have reviewed the patient's current medications . enoxaparin (LOVENOX) injection  40 mg Subcutaneous Q24H  . sodium chloride  3 mL Intravenous Q12H     acetaminophen **OR** acetaminophen, ketorolac, methocarbamol, nitroGLYCERIN, oxyCODONE  Prior to Admission medications   Medication Sig Start Date End Date Taking? Authorizing Provider  Ascorbic Acid (VITAMIN C PO) Take 1 tablet by mouth daily.   Yes Historical Provider, MD  CALCIUM CARBONATE PO Take 1 tablet by mouth daily.   Yes Historical Provider, MD  Cholecalciferol (VITAMIN D PO) Take 1 tablet by  mouth daily.   Yes Historical Provider, MD  CINNAMON PO Take 1 capsule by mouth. Takes occasionally   Yes Historical Provider, MD  CYANOCOBALAMIN PO Take 1 tablet by mouth daily.   Yes Historical Provider, MD  KRILL OIL PO Take by mouth. Takes 1 tab occasionally   Yes Historical Provider, MD  magnesium gluconate (MAGONATE) 500 MG tablet Take 500 mg by mouth daily.   Yes Historical Provider, MD  Multiple Vitamin (MULTIVITAMIN) capsule Take 1 capsule by mouth daily.   Yes Historical Provider, MD  Polyethyl Glycol-Propyl Glycol (SYSTANE OP) Apply 1 drop to eye daily as needed (Dry eyes).   Yes Historical Provider, MD  acyclovir cream (ZOVIRAX) 5 % APPLY DAILY AS NEEDED 01/16/15   Abner Greenspan, MD  hydrocortisone (ANUSOL-HC) 2.5 % rectal cream Place 1 application rectally as needed. Apply to affected area once daily as needed for hemorrhoids. 06/14/13   Abner Greenspan, MD  methocarbamol (ROBAXIN) 500 MG tablet Take 1 tablet (500 mg total) by mouth every 6 (six) hours as needed for muscle spasms. 11/22/14   Arlee Muslim, PA-C  oxyCODONE (OXY IR/ROXICODONE) 5 MG immediate release tablet Take 1-2 tablets (5-10 mg total) by mouth every 3 (three) hours as needed for moderate pain, severe pain or breakthrough pain. 11/22/14   Arlee Muslim, PA-C  rivaroxaban (XARELTO) 10 MG TABS tablet Take 1 tablet (10 mg total) by mouth daily with breakfast. Take Xarelto for two and a half more weeks, then discontinue Xarelto. Once the patient has completed the Xarelto, they may resume the 81 mg Aspirin. 11/22/14   Arlee Muslim, PA-C  traMADol (ULTRAM) 50 MG tablet Take 1-2 tablets (50-100 mg total) by mouth every 6 (six) hours as needed (mild pain). 11/22/14   Arlee Muslim, PA-C     History   Social History  . Marital Status: Married    Spouse Name: N/A  . Number of Children: N/A  . Years of Education: N/A   Occupational History  . Not on file.   Social History Main Topics  . Smoking status: Former Smoker -- 1.00  packs/day for 15 years    Types: Cigarettes    Quit date: 10/07/1978  . Smokeless tobacco: Never Used  . Alcohol Use: No  . Drug Use: No  . Sexual Activity: Yes    Birth Control/ Protection: Post-menopausal, Surgical     Comment: HYST   Other Topics Concern  . Not on file   Social History Narrative   Daily caffeine use.   Patient gets regular exercise.    Family Status  Relation Status Death Age  . Mother Deceased     uterine CA, HTN, obese, CLL and thrombocytopenia  .  Father Deceased     CAD, ETOH  . Maternal Grandmother Deceased   . Maternal Grandfather Deceased    Family History  Problem Relation Age of Onset  . Hypertension Mother   . Cancer Mother     uterine  . Obesity Mother   . Thrombocytopenia Mother   . Diabetes Mother   . Alcohol abuse Father   . Heart disease Father     CAD in 49's  . Emphysema Father   . Heart disease Paternal Aunt     CAD  . Heart disease Paternal Uncle     CAD  . Colon cancer Neg Hx   . Diabetes Maternal Grandmother     type 2  . Diabetes Maternal Grandfather     type 2     ROS:  Full 14 point review of systems complete and found to be negative unless listed above.  Physical Exam: Blood pressure 110/62, pulse 74, temperature 97.9 F (36.6 C), temperature source Oral, resp. rate 18, height 5\' 5"  (1.651 m), weight 175 lb (79.379 kg), SpO2 96 %.  General: Well developed, well nourished, female in no acute distress Head: Eyes PERRLA, No xanthomas. Normocephalic and atraumatic, oropharynx without edema or exudate.  Lungs: Resp regular and unlabored, CTA. Heart: RRR no s3, s4, or murmurs..   Neck: No carotid bruits. No lymphadenopathy.  JVD. Abdomen: Bowel sounds present, abdomen soft and non-tender without masses or hernias noted. Msk:  No spine or cva tenderness. No weakness, no joint deformities or effusions. Extremities: No clubbing, cyanosis or edema. DP/PT/Radials 2+ and equal bilaterally. Neuro: Alert and oriented X 3. No  focal deficits noted. Psych:  Good affect, responds appropriately Skin: No rashes or lesions noted.  Labs:   Lab Results  Component Value Date   WBC 5.0 05/11/2015   HGB 14.2 05/11/2015   HCT 40.8 05/11/2015   MCV 92.9 05/11/2015   PLT 176 05/11/2015   No results for input(s): INR in the last 72 hours.  Recent Labs Lab 05/11/15 0259  NA 140  K 4.0  CL 106  CO2 26  BUN 16  CREATININE 0.83  CALCIUM 9.6  PROT 5.9*  BILITOT 0.5  ALKPHOS 62  ALT 16  AST 21  GLUCOSE 96  ALBUMIN 3.5   No results found for: MG  Recent Labs  05/10/15 1934 05/11/15 0259 05/11/15 0723  TROPONINI <0.03 <0.03 <0.03    Recent Labs  05/10/15 1520  TROPIPOC 0.00   No results found for: PROBNP Lab Results  Component Value Date   CHOL 189 05/11/2015   HDL 45 05/11/2015   LDLCALC 126* 05/11/2015   TRIG 92 05/11/2015   Echo: 05/11/15 LV EF: 55% -  60%  ------------------------------------------------------------------- Indications:   Chest pain 786.51.  ------------------------------------------------------------------- History:  Risk factors: Dyslipidemia.  ------------------------------------------------------------------- Study Conclusions  - Left ventricle: The cavity size was normal. Systolic function was normal. The estimated ejection fraction was in the range of 55% to 60%. Probable hypokinesis of the lateral and inferolateral myocardium. Left ventricular diastolic function parameters were normal.  Impressions:  - Incessant PVCs are noted during the study and limit the conifdence of wall motion evaluation. Nevertheless, there appears to be hypokinesis in the left circumflex artery distribution.  ECG:   Vent. rate 69 BPM PR interval 174 ms QRS duration 78 ms QT/QTc 388/415 ms P-R-T axes 78 81 69  Radiology:  Dg Chest 2 View  05/10/2015   CLINICAL DATA:  Chest pain. Initial encounter. Chest pain  radiating to the LEFT shoulder and back.  EXAM:  CHEST  2 VIEW  COMPARISON:  06/27/2009.  06/28/2010.  FINDINGS: Cardiopericardial silhouette within normal limits. Mediastinal contours normal. Trachea midline. No airspace disease or effusion. Dextroconvex thoracic scoliosis.  IMPRESSION: No active cardiopulmonary disease.   Electronically Signed   By: Dereck Ligas M.D.   On: 05/10/2015 16:18    ASSESSMENT AND PLAN:     1. Atypical chest pain - L upper chest pain radiated to her back. Currently asymptomatic.  - EKG without acute abnormality. Trop x 3 negative.  - Echo showed LV EF of 55-60%, hypokinesis in the left circumflex artery distribution, with Incessant PVCs.  - Tele showed frequent PVCs.  - She was schedule for treadmill stress test today, however got cancelled due to knee issue.  2. Palpitation - long standing hx of palpitation. Was on atenolol previously. Stopped by her self. Her frequent PVCs likely due to untreated palpitations.  - Consider restarting BB   3. HL - 05/11/2015: Cholesterol 189; HDL 45; LDL Cholesterol 126*; Triglycerides 92; VLDL 18   - Diet controlled.   4. Mitral valve prolapse (per patient) - Echo 06/2010 showed mild mitral regur.  Echo today showed no prolapse and mild MR .    SignedLeanor Kail, PA 05/11/2015, 4:25 PM Pager [phone]  Co-Sign MD Patient seen and examined and history reviewed. Agree with above findings and plan. Patient admitted with atypical shoulder and upper chest pain. Cardiac enzymes are normal. Ecg without acute change. I personally reviewed her Echo findings. She has mild MR without evidence of prolapse. There was some concern for lateral wall hypokinesis but in the parasternal short axis this looks normal. In the apical view there is some question but this may be confounded by frequent PVCs. I think a Leane Call is reasonable to complete her evaluation but based on her evaluation to date I think she is low risk and can be DC today. We will arrange outpatient Myoview  study.  Graycen Degan [place], [place] 05/11/2015 5:44 PM

## 2015-05-12 ENCOUNTER — Telehealth (HOSPITAL_COMMUNITY): Payer: Self-pay

## 2015-05-12 NOTE — Telephone Encounter (Signed)
Patient given detailed instructions per Myocardial Perfusion Study Information Sheet for test on 05-15-2015 at 0745. Patient Notified to arrive 15 minutes early, and that it is imperative to arrive on time for appointment to keep from having the test rescheduled. Patient verbalized understanding. Oletta Lamas, Attikus Bartoszek A

## 2015-05-15 ENCOUNTER — Encounter: Payer: Self-pay | Admitting: Internal Medicine

## 2015-05-15 ENCOUNTER — Encounter: Payer: Self-pay | Admitting: *Deleted

## 2015-05-15 ENCOUNTER — Encounter: Payer: Self-pay | Admitting: Cardiovascular Disease

## 2015-05-15 ENCOUNTER — Telehealth (HOSPITAL_COMMUNITY): Payer: Self-pay

## 2015-05-15 ENCOUNTER — Ambulatory Visit (AMBULATORY_SURGERY_CENTER): Payer: Self-pay

## 2015-05-15 ENCOUNTER — Encounter (HOSPITAL_COMMUNITY): Payer: Managed Care, Other (non HMO)

## 2015-05-15 VITALS — Ht 65.0 in | Wt 178.8 lb

## 2015-05-15 DIAGNOSIS — Z1211 Encounter for screening for malignant neoplasm of colon: Secondary | ICD-10-CM

## 2015-05-15 NOTE — Telephone Encounter (Signed)
Encounter complete. 

## 2015-05-15 NOTE — Progress Notes (Signed)
PONV with general anesthesia  No allergies to eggs or soy No diet/weight loss meds No home oxygen No other past problems with anesthesia  No computer

## 2015-05-17 ENCOUNTER — Ambulatory Visit (HOSPITAL_COMMUNITY): Payer: Managed Care, Other (non HMO) | Attending: Cardiology

## 2015-05-17 DIAGNOSIS — R931 Abnormal findings on diagnostic imaging of heart and coronary circulation: Secondary | ICD-10-CM | POA: Diagnosis not present

## 2015-05-17 LAB — MYOCARDIAL PERFUSION IMAGING
CHL CUP NUCLEAR SDS: 0
CHL CUP NUCLEAR SSS: 6
CHL CUP RESTING HR STRESS: 61 {beats}/min
CSEPPHR: 107 {beats}/min
LHR: 0.32
LV dias vol: 95 mL
LV sys vol: 45 mL
NUC STRESS TID: 0.91
SRS: 6

## 2015-05-17 MED ORDER — AMINOPHYLLINE 25 MG/ML IV SOLN
75.0000 mg | Freq: Once | INTRAVENOUS | Status: AC
  Administered 2015-05-17: 75 mg via INTRAVENOUS

## 2015-05-17 MED ORDER — REGADENOSON 0.4 MG/5ML IV SOLN
0.4000 mg | Freq: Once | INTRAVENOUS | Status: AC
  Administered 2015-05-17: 0.4 mg via INTRAVENOUS

## 2015-05-17 MED ORDER — TECHNETIUM TC 99M SESTAMIBI GENERIC - CARDIOLITE
10.6000 | Freq: Once | INTRAVENOUS | Status: AC | PRN
  Administered 2015-05-17: 11 via INTRAVENOUS

## 2015-05-17 MED ORDER — TECHNETIUM TC 99M SESTAMIBI GENERIC - CARDIOLITE
31.9000 | Freq: Once | INTRAVENOUS | Status: AC | PRN
  Administered 2015-05-17: 31.9 via INTRAVENOUS

## 2015-05-18 ENCOUNTER — Telehealth: Payer: Self-pay | Admitting: Cardiology

## 2015-05-18 NOTE — Telephone Encounter (Signed)
New Message  Pt calling about myoview results. Please call back and discuss.

## 2015-05-18 NOTE — Telephone Encounter (Signed)
Test result needs to be released.  Sending to [place]. Dr. Doug Sou Nurse.  Patient can be reached on cell phone number on record she states

## 2015-05-19 ENCOUNTER — Telehealth: Payer: Self-pay | Admitting: Cardiology

## 2015-05-19 NOTE — Telephone Encounter (Signed)
Returned call to patient [name] [name] results given.She stated she had pain in left shoulder and left shoulder blade this past Wed and went to ER.Stated she is having a slight pain in left shoulder this morning.No chest pain.Stated she is concerned her friend had a normal stress test and she ended up having a cath with a blockage.Stated she has appointment with PCP on Mon 8/15 and she will discuss.Advised I will let Dr.Jordan know.

## 2015-05-19 NOTE — Telephone Encounter (Signed)
Returned call to patient.Dr.Jordan advised to keep appointment with PCP on Mon 8/15 so they fill atenolol.He advised to keep appointment with Cecilie Kicks NP 06/06/15 at 1:30 pm.Advised to call sooner if needed.

## 2015-05-19 NOTE — Telephone Encounter (Signed)
She is seeing her primary on Monday so they can fill atenolol. I would keep appt. With Mickel Baas since patient expressed concern about the potential for stress test to miss something and she was still having some shoulder pain.  Ramiz Turpin [place] MD, [facility]

## 2015-05-19 NOTE — Telephone Encounter (Signed)
Discussed results of stress test & Dr. Doug Sou recommendations w/ patient. She voiced understanding.  2 questions: -she wants to resume atenolol - should she get this from PCP? -does she need to keep next appt w/ Cecilie Kicks?

## 2015-05-19 NOTE — Telephone Encounter (Signed)
New message     Pt calling in regards to test results Please call before 2pm on home number after that time pt can be reached on cell until 2:30

## 2015-05-20 DIAGNOSIS — R079 Chest pain, unspecified: Secondary | ICD-10-CM | POA: Insufficient documentation

## 2015-05-22 ENCOUNTER — Ambulatory Visit (INDEPENDENT_AMBULATORY_CARE_PROVIDER_SITE_OTHER): Payer: Managed Care, Other (non HMO) | Admitting: Family Medicine

## 2015-05-22 ENCOUNTER — Encounter: Payer: Self-pay | Admitting: Family Medicine

## 2015-05-22 VITALS — BP 110/62 | HR 74 | Temp 98.2°F | Ht 65.0 in | Wt 178.5 lb

## 2015-05-22 DIAGNOSIS — R002 Palpitations: Secondary | ICD-10-CM | POA: Diagnosis not present

## 2015-05-22 DIAGNOSIS — E7800 Pure hypercholesterolemia, unspecified: Secondary | ICD-10-CM

## 2015-05-22 DIAGNOSIS — R0789 Other chest pain: Secondary | ICD-10-CM | POA: Diagnosis not present

## 2015-05-22 MED ORDER — ATENOLOL 25 MG PO TABS: 12.5000 mg | ORAL_TABLET | Freq: Every day | ORAL | Status: DC

## 2015-05-22 NOTE — Progress Notes (Signed)
Subjective:    Patient ID: [name] [name], female    DOB: Jan 11, 1951, 64 y.o.   MRN: [id number]  HPI Here for hospital follow up   Feeling better - pain is about gone now - past few days   Was hosp 8/3-8/4 for CP Was r/o for MI Given prn nitro Had ? 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 Palpitations 05/22/2015  . Pain in the chest   . Chest pain 05/10/2015  . RLQ abdominal pain 04/19/2015  . Cervical paraspinal muscle spasm 02/28/2015  . Viral URI with cough 01/16/2015  . Sore throat 01/16/2015  . OA (osteoarthritis) of knee 11/21/2014  . Pre-operative examination 10/21/2014  . UTI (urinary tract infection) 07/25/2014  . Colon cancer screening 11/29/2013  . Routine general medical examination at a health care facility 09/16/2011  . DERMATITIS, ATOPIC 11/27/2010  . URTICARIA DUE TO COLD OR HEAT 11/27/2010  . STRESS REACTION, ACUTE, WITH EMOTIONAL DISTURBANCE 09/14/2010  . THROAT PAIN, CHRONIC 09/14/2010  . EXTERNAL HEMORRHOIDS WITHOUT MENTION COMP 06/25/2010  . ABNORMAL EKG 12/26/2009  . OTHER CHRONIC SINUSITIS 10/19/2009  . UNSPECIFIED VITAMIN D DEFICIENCY 04/21/2009  . ALLERGIC RHINITIS 11/09/2008  .  GERD 07/28/2008  . OSTEOPENIA 03/14/2008  . HYPERCHOLESTEROLEMIA, PURE 03/13/2007  . HIATAL HERNIA 01/12/2007   Past Medical History  Diagnosis Date  . Melanoma of skin   . Basal cell cancer 5/08  . Hyperlipidemia DIET CONTROL  . Eczema   . Chest pain 9/11    [facility]) and palpitations - ruled out for MI with nl. Echo  . Heart palpitations OCCASIONAL--  TAKES ATENOLOL PRN  . Normal nuclear stress test 07-09-10  . Complication of anesthesia HARD TO WAKE  . Hiatal hernia   . Osteopenia 12/2013    T score -1.6 FRAX 16%/0.8%  . HSV-2 infection     buttocks  . Breast cyst     "left"  . PONV (postoperative nausea and vomiting)   . MVP (mitral valve prolapse)     per pt no treatment needed  . Difficulty sleeping   . Acute meniscal tear of knee   . GERD (gastroesophageal reflux disease)   . Arthritis     "neck; left knee before replacement" (05/10/2015)   Past Surgical History  Procedure Laterality Date  . Mri neck  3/00    C4-C5 herniation small stenosis mild C4-5, C5-6  . Melanoma excision Right 1995    "side"  . Carotid US  9/05    Mild, recheck 1 year  .  Carotid US  10/06    Normal  . Esophagogastroduodenoscopy  9/07    Normal  . Mole removal      "several; all over my body"  . Hysteroscopy with resectoscope  2000    POLPECTOMY'S  . Tubal ligation  1989  . Tonsillectomy    . Transthoracic echocardiogram  06-29-2010    NORMAL LVSF, EF 55-60%,  MILD MR  . Knee arthroscopy  10/23/2011    Procedure: ARTHROSCOPY KNEE;  Surgeon: Gearlean Alf, MD;  Location: [facility];  Service: Orthopedics;  Laterality: Left;  LEFT KNEE SCOPE WITH DEBRIDEMENT   . Chondroplasty  10/23/2011    Procedure: CHONDROPLASTY;  Surgeon: Gearlean Alf, MD;  Location[facility];  Service: Orthopedics;  Laterality: Left;  Left medial   . Vaginal hysterectomy  2001    complex hyperplasia with mother's history of uterine cancer  . Total knee arthroplasty Left  11/21/2014    Procedure: LEFT TOTAL KNEE ARTHROPLASTY;  Surgeon: Gearlean Alf, MD;  Location: [facility];  Service: Orthopedics;  Laterality: Left;  . Joint replacement    . Mohs surgery Left     "side of my nose"   Social History  Substance Use Topics  . Smoking status: Former Smoker -- 1.00 packs/day for 15 years    Types: Cigarettes    Quit date: 10/07/1978  . Smokeless tobacco: Never Used  . Alcohol Use: No   Family History  Problem Relation Age of Onset  . Hypertension Mother   . Cancer Mother     uterine  . Obesity Mother   . Thrombocytopenia Mother   . Diabetes Mother   . Alcohol abuse Father   . Heart disease Father     CAD in 13's  . Emphysema Father   . Heart disease Paternal Aunt     CAD  . Heart disease Paternal Uncle     CAD  . Colon cancer Neg Hx   . Diabetes Maternal Grandmother     type 2  . Diabetes Maternal Grandfather     type 2   Allergies  Allergen Reactions  . Amoxicillin Rash  . Codeine Rash  . Clarithromycin Other (See Comments)    REACTION: reaction not known   Current Outpatient Prescriptions on File Prior to Visit  Medication Sig Dispense Refill  . Ascorbic Acid (VITAMIN C PO) Take 1 tablet by mouth daily.    Marland Kitchen aspirin EC 81 MG tablet Take 1 tablet (81 mg total) by mouth daily.    Marland Kitchen CALCIUM CARBONATE PO Take 1 tablet by mouth daily.    . Cholecalciferol (VITAMIN D PO) Take 1 tablet by mouth daily.    Marland Kitchen CINNAMON PO Take 1 capsule by mouth. Takes occasionally    . CYANOCOBALAMIN PO Take 1 tablet by mouth daily.    Marland Kitchen KRILL OIL PO Take by mouth. Takes 1 tab occasionally    . magnesium gluconate (MAGONATE) 500 MG tablet Take 500 mg by mouth daily.    . methocarbamol (ROBAXIN) 500 MG tablet Take 1 tablet (500 mg total) by mouth every 6 (six) hours as needed for muscle spasms. 80 tablet 0  . Multiple Vitamin (MULTIVITAMIN) capsule Take 1 capsule by mouth daily.    Vladimir Faster Glycol-Propyl Glycol (SYSTANE OP) Apply 1 drop to eye daily as needed  (Dry eyes).     No current facility-administered medications on file prior to visit.    Review of Systems Review of Systems  Constitutional: Negative for fever, appetite change, fatigue and unexpected weight change.  Eyes: Negative for pain and visual disturbance.  Respiratory: Negative for cough and shortness of breath.   Cardiovascular: Negative for cp or palpitations   neg for pedal edema/PND or orthopnea  Gastrointestinal: Negative for nausea, diarrhea and constipation.  Genitourinary: Negative for urgency and frequency.  Skin: Negative for pallor or rash   Neurological: Negative for weakness, light-headedness, numbness and headaches.  Hematological: Negative for adenopathy. Does not bruise/bleed easily.  Psychiatric/Behavioral: Negative for dysphoric mood. The patient is not nervous/anxious.         Objective:   Physical Exam  Constitutional: She appears well-developed and well-nourished. No distress.  overwt and well app  HENT:  Head: Normocephalic and atraumatic.  Mouth/Throat: Oropharynx is clear and moist.  Eyes: Conjunctivae and EOM are normal. Pupils are equal, round, and reactive to light.  Neck: Normal range of motion. Neck supple. No JVD present. Carotid bruit is not present. No thyromegaly present.  Cardiovascular: Normal rate, regular rhythm, normal heart sounds and intact distal pulses.  Exam reveals no gallop.   Pulmonary/Chest: Effort normal and breath sounds normal. No respiratory distress. She has no wheezes. She has no rales.  No crackles  Abdominal: Soft. Bowel sounds are normal. She exhibits no distension, no abdominal bruit and no mass. There is no tenderness.  Musculoskeletal: She exhibits no edema.  Baseline thoracolumbar scoliosis No back tenderness today  Lymphadenopathy:    She has no cervical adenopathy.  Neurological: She is alert. She has normal reflexes.  Skin: Skin is warm and dry. No rash noted.  Psychiatric: She has a normal mood and affect.           Assessment & Plan:   Problem List Items Addressed This Visit    Chest pain - Primary    This has resolved/ as is the shoulder blade pain  Still has occ palpitations-begin back on low dose atenolol  Reviewed hospital records and studies  Re assuring stress test  F/u with cardiology as planned       HYPERCHOLESTEROLEMIA, PURE    Mild with HDL of 45 and LDL of 126- not bad if otherwise low risk  Pt declines statin medicines  Disc goals for lipids and reasons to control them Rev labs with pt Rev low sat fat diet in detail        Relevant Medications   atenolol (TENORMIN) 25 MG tablet   Palpitations    Intermittent  PVCs noted on recent hospitalization Refilled low dose atenolol for use daily (can inc to bid if needed)  F/u with cardiology as planned

## 2015-05-22 NOTE — Patient Instructions (Signed)
Follow up with cardiology as planned Start the atenolol  For cholesterol - control with diet the best you can (Avoid red meat/ fried foods/ egg yolks/ fatty breakfast meats/ butter, cheese and high fat dairy/ and shellfish) -- egg whites are fine and [place] sausage is ok also   If palpitations or chest pain return- please let us know and seek care if needed

## 2015-05-22 NOTE — Progress Notes (Signed)
Pre visit review using our clinic review tool, if applicable. No additional management support is needed unless otherwise documented below in the visit note. 

## 2015-05-22 NOTE — Assessment & Plan Note (Signed)
Intermittent  PVCs noted on recent hospitalization Refilled low dose atenolol for use daily (can inc to bid if needed)  F/u with cardiology as planned

## 2015-05-22 NOTE — Assessment & Plan Note (Signed)
This has resolved/ as is the shoulder blade pain  Still has occ palpitations-begin back on low dose atenolol  Reviewed hospital records and studies  Re assuring stress test  F/u with cardiology as planned

## 2015-05-22 NOTE — Assessment & Plan Note (Signed)
Mild with HDL of 45 and LDL of 126- not bad if otherwise low risk  Pt declines statin medicines  Disc goals for lipids and reasons to control them Rev labs with pt Rev low sat fat diet in detail

## 2015-05-29 ENCOUNTER — Encounter: Payer: Self-pay | Admitting: Internal Medicine

## 2015-05-29 ENCOUNTER — Telehealth: Payer: Self-pay | Admitting: Internal Medicine

## 2015-05-29 ENCOUNTER — Ambulatory Visit (AMBULATORY_SURGERY_CENTER): Payer: Managed Care, Other (non HMO) | Admitting: Internal Medicine

## 2015-05-29 VITALS — BP 120/60 | HR 61 | Temp 97.1°F | Resp 15 | Ht 65.0 in | Wt 178.0 lb

## 2015-05-29 DIAGNOSIS — Z1211 Encounter for screening for malignant neoplasm of colon: Secondary | ICD-10-CM | POA: Diagnosis not present

## 2015-05-29 MED ORDER — SODIUM CHLORIDE 0.9 % IV SOLN: 500.0000 mL | INTRAVENOUS | Status: DC

## 2015-05-29 NOTE — Patient Instructions (Addendum)
The colonoscpy was normal!  I appreciate the opportunity to care for you. Gatha Mayer, MD, FACG     YOU HAD AN ENDOSCOPIC PROCEDURE TODAY AT [facility]:   Refer to the procedure report that was given to you for any specific questions about what was found during the examination.  If the procedure report does not answer your questions, please call your gastroenterologist to clarify.  If you requested that your care partner not be given the details of your procedure findings, then the procedure report has been included in a sealed envelope for you to review at your convenience later.  YOU SHOULD EXPECT: Some feelings of bloating in the abdomen. Passage of more gas than usual.  Walking can help get rid of the air that was put into your GI tract during the procedure and reduce the bloating. If you had a lower endoscopy (such as a colonoscopy or flexible sigmoidoscopy) you may notice spotting of blood in your stool or on the toilet paper. If you underwent a bowel prep for your procedure, you may not have a normal bowel movement for a few days.  Please Note:  You might notice some irritation and congestion in your nose or some drainage.  This is from the oxygen used during your procedure.  There is no need for concern and it should clear up in a day or so.  SYMPTOMS TO REPORT IMMEDIATELY:   Following lower endoscopy (colonoscopy or flexible sigmoidoscopy):  Excessive amounts of blood in the stool  Significant tenderness or worsening of abdominal pains  Swelling of the abdomen that is new, acute  Fever of 100F or higher    For urgent or emergent issues, a gastroenterologist can be reached at any hour by calling [phone].   DIET: Your first meal following the procedure should be a small meal and then it is ok to progress to your normal diet. Heavy or fried foods are harder to digest and may make you feel nauseous or bloated.  Likewise, meals heavy in dairy and  vegetables can increase bloating.  Drink plenty of fluids but you should avoid alcoholic beverages for 24 hours.  ACTIVITY:  You should plan to take it easy for the rest of today and you should NOT DRIVE or use heavy machinery until tomorrow (because of the sedation medicines used during the test).    FOLLOW UP: Our staff will call the number listed on your records the next business day following your procedure to check on you and address any questions or concerns that you may have regarding the information given to you following your procedure. If we do not reach you, we will leave a message.  However, if you are feeling well and you are not experiencing any problems, there is no need to return our call.  We will assume that you have returned to your regular daily activities without incident.  If any biopsies were taken you will be contacted by phone or by letter within the next 1-3 weeks.  Please call us at [phone] if you have not heard about the biopsies in 3 weeks.    SIGNATURES/CONFIDENTIALITY: You and/or your care partner have signed paperwork which will be entered into your electronic medical record.  These signatures attest to the fact that that the information above on your After Visit Summary has been reviewed and is understood.  Full responsibility of the confidentiality of this discharge information lies with you and/or your care-partner.  Resume medications.

## 2015-05-29 NOTE — Op Note (Signed)
[place]  [address] [place], [id number]   COLONOSCOPY PROCEDURE REPORT  PATIENT: [name] [name], [name] [name]  MR#: [id number] BIRTHDATE: Aug 21, 1951 , 64  yrs. old GENDER: female ENDOSCOPIST: Gatha Mayer, MD, [facility] PROCEDURE DATE:  05/29/2015 PROCEDURE:   Colonoscopy, screening First Screening Colonoscopy - Avg.  risk and is 50 yrs.  old or older - No.  Prior Negative Screening - Now for repeat screening. 10 or more years since last screening  History of Adenoma - Now for follow-up colonoscopy & has been > or = to 3 yrs.  N/A  Polyps removed today? No Recommend repeat exam, <10 yrs? No ASA CLASS:   Class II INDICATIONS:Screening for colonic neoplasia and Colorectal Neoplasm Risk Assessment for this procedure is average risk. MEDICATIONS: Propofol 160 mg IV and Monitored anesthesia care  DESCRIPTION OF PROCEDURE:   After the risks benefits and alternatives of the procedure were thoroughly explained, informed consent was obtained.  The digital rectal exam revealed no abnormalities of the rectum.   The LB [id number]  endoscope was introduced through the anus and advanced to the cecum, which was identified by both the appendix and ileocecal valve. No adverse events experienced.   The quality of the prep was good.  (MiraLax was used)  The instrument was then slowly withdrawn as the colon was fully examined. Estimated blood loss is zero unless otherwise noted in this procedure report.      COLON FINDINGS: A normal appearing cecum, ileocecal valve, and appendiceal orifice were identified.  The ascending, transverse, descending, sigmoid colon, and rectum appeared unremarkable. Retroflexed views revealed no abnormalities. The time to cecum = 3.7 Withdrawal time = 7.3   The scope was withdrawn and the procedure completed. COMPLICATIONS: There were no immediate complications.  ENDOSCOPIC IMPRESSION: Normal colonoscopy  RECOMMENDATIONS: Repeat colon screening  2026 - 10 years.  eSigned:  Gatha Mayer, MD, [facility] 05/29/2015 9:26 AM   cc: The Patient

## 2015-05-29 NOTE — Progress Notes (Signed)
To recovery, report to Brown, RN, VSS. 

## 2015-05-30 ENCOUNTER — Telehealth: Payer: Self-pay | Admitting: *Deleted

## 2015-05-30 NOTE — Telephone Encounter (Signed)
Patient spoke with an [employer] nurse this am.  All her questions were answered.  She is asked to call back for any additional questions or concerns

## 2015-05-30 NOTE — Telephone Encounter (Signed)
  Follow up Call-  Call back number 05/29/2015  Post procedure Call Back phone  # [phone]  Permission to leave phone message Yes     Patient questions:  Do you have a fever, pain , or abdominal swelling? No. Pain Score  0 *  Have you tolerated food without any problems? Yes.    Have you been able to return to your normal activities? Yes.    Do you have any questions about your discharge instructions: Diet   No. Medications  No. Follow up visit  No.  Do you have questions or concerns about your Care? Yes.    Actions: * If pain score is 4 or above: No action needed, pain <4.

## 2015-06-06 ENCOUNTER — Ambulatory Visit (INDEPENDENT_AMBULATORY_CARE_PROVIDER_SITE_OTHER): Payer: Managed Care, Other (non HMO) | Admitting: Cardiology

## 2015-06-06 ENCOUNTER — Encounter: Payer: Self-pay | Admitting: Cardiology

## 2015-06-06 VITALS — BP 112/70 | HR 68 | Ht 65.0 in | Wt 180.9 lb

## 2015-06-06 DIAGNOSIS — E785 Hyperlipidemia, unspecified: Secondary | ICD-10-CM

## 2015-06-06 DIAGNOSIS — R0789 Other chest pain: Secondary | ICD-10-CM | POA: Diagnosis not present

## 2015-06-06 DIAGNOSIS — R002 Palpitations: Secondary | ICD-10-CM | POA: Diagnosis not present

## 2015-06-06 NOTE — Progress Notes (Signed)
Cardiology Office Note   Date:  06/06/2015   ID:  [name] [name], [name] [name] May 29, 1951, MRN [id number]  PCP:  [name] Pardon, MD  Cardiologist:  Dr. Martinique    Chief Complaint  Patient presents with  . Hospitalization Follow-up    [facility]:  No complaints      History of Present Illness: [name] [name] is a 64 y.o. female who presents for hospital follow up and results of stress test.  a history of chronic palpitations, atypical chest pain, SVT (2005), HL (diet controlled), Basal cell carcinoma, mitral valve prolapse (per patient), GERD who presented 05/10/15 with a atypical chest pain.   The patient had a hx of palpitation, previous been on atenolol. She came to ED with L upper sided chest pain with radiation to the L shoulder. She describes the pain as a achy, worsen with movement that was associated with nausea. She denies SOB, recent palpitations, LE edema, orthopnea or PND. EKG was unremarkable and initial troponin was neg.   Stress test:  Nuclear stress EF: 53%.  There was no ST segment deviation noted during stress.  The study is normal.  This is a low risk study.  The left ventricular ejection fraction is mildly decreased (45-54%).  Low risk stress nuclear study with normal perfusion and borderline global systolic function.  Echo: Study Conclusions  - Left ventricle: The cavity size was normal. Systolic function was normal. The estimated ejection fraction was in the range of 55% to 60%. Probable hypokinesis of the lateral and inferolateral myocardium. Left ventricular diastolic function parameters were normal.  Impressions:  - Incessant PVCs are noted during the study and limit the conifdence of wall motion evaluation. Nevertheless, there appears to be hypokinesis in the left circumflex artery distribution.  Today:some Lt shoulder pain, better with rest, has bike that she does upper arms with cycling.  May have strained shoulder.  Reassured about  stress test, though if further symptoms to call us.    Past Medical History  Diagnosis Date  . Hyperlipidemia DIET CONTROL  . Eczema   . Chest pain 9/11    [facility]) and palpitations - ruled out for MI with nl. Echo  . Heart palpitations OCCASIONAL--  TAKES ATENOLOL PRN  . Normal nuclear stress test 07-09-10  . Complication of anesthesia HARD TO WAKE  . Hiatal hernia   . Osteopenia 12/2013    T score -1.6 FRAX 16%/0.8%  . HSV-2 infection     buttocks  . Breast cyst     "left"  . PONV (postoperative nausea and vomiting)   . MVP (mitral valve prolapse)     per pt no treatment needed  . Difficulty sleeping   . Acute meniscal tear of knee   . GERD (gastroesophageal reflux disease)   . 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 Melanoma of skin   . Basal cell cancer 5/08    Past Surgical History  Procedure Laterality Date  . Mri neck  3/00    C4-C5 herniation small stenosis mild C4-5, C5-6  . Melanoma excision Right 1995    "side"  . Carotid US  9/05    Mild, recheck 1 year  . Carotid US  10/06    Normal  . Esophagogastroduodenoscopy  9/07    Normal  . Mole removal      "several; all over my body"  . Hysteroscopy with resectoscope  2000    POLPECTOMY'S  . Tubal ligation  1989  . Tonsillectomy    . Transthoracic echocardiogram  06-29-2010    NORMAL LVSF, EF 55-60%,  MILD MR  . Knee arthroscopy  10/23/2011    Procedure: ARTHROSCOPY KNEE;  Surgeon: [name] Alf, MD;  Location: [facility];  Service: Orthopedics;  Laterality: Left;  LEFT KNEE SCOPE WITH DEBRIDEMENT   . Chondroplasty  10/23/2011    Procedure: CHONDROPLASTY;  Surgeon: [name] Alf, MD;  Location: [facility];  Service: Orthopedics;  Laterality: Left;  Left medial   . Vaginal hysterectomy  2001    complex hyperplasia with mother's history of uterine cancer  . Total knee arthroplasty Left 11/21/2014    Procedure: LEFT TOTAL KNEE ARTHROPLASTY;  Surgeon:  [name] Alf, MD;  Location: [facility];  Service: Orthopedics;  Laterality: Left;  . Joint replacement    . Mohs surgery Left     "side of my nose"  . Colonoscopy       Current Outpatient Prescriptions  Medication Sig Dispense Refill  . Ascorbic Acid (VITAMIN C PO) Take 1 tablet by mouth daily.    Marland Kitchen aspirin EC 81 MG tablet Take 1 tablet (81 mg total) by mouth daily.    Marland Kitchen atenolol (TENORMIN) 25 MG tablet Take 0.5 tablets (12.5 mg total) by mouth daily. 30 tablet 11  . CALCIUM CARBONATE PO Take 1 tablet by mouth daily.    . Cholecalciferol (VITAMIN D PO) Take 1 tablet by mouth daily.    Marland Kitchen CINNAMON PO Take 1 capsule by mouth. Takes occasionally    . CYANOCOBALAMIN PO Take 1 tablet by mouth daily.    Marland Kitchen KRILL OIL PO Take by mouth. Takes 1 tab occasionally    . magnesium gluconate (MAGONATE) 500 MG tablet Take 500 mg by mouth daily.    . Multiple Vitamin (MULTIVITAMIN) capsule Take 1 capsule by mouth daily.    [name] [name] Glycol-Propyl Glycol (SYSTANE OP) Apply 1 drop to eye daily as needed (Dry eyes).    Marland Kitchen ZOVIRAX 5 % APPLY DAILY AS NEEDED  3   No current facility-administered medications for this visit.    Allergies:   Amoxicillin; Codeine; and Clarithromycin    Social History:  The patient  reports that she quit smoking about 36 years ago. Her smoking use included Cigarettes. She has a 15 pack-year smoking history. She has never used smokeless tobacco. She reports that she does not drink alcohol or use illicit drugs.   Family History:  The patient's family history includes Alcohol abuse in her father; Cancer in her mother; Diabetes in her maternal grandfather, maternal grandmother, and mother; Emphysema in her father; Heart disease in her father, paternal aunt, and paternal uncle; Hypertension in her mother; Obesity in her mother; Thrombocytopenia in her mother. There is no history of Colon cancer.    ROS:  General:no colds or fevers, no weight changes Skin:no rashes or  ulcers HEENT:no blurred vision, no congestion CV:see HPI PUL:see HPI GI:no diarrhea constipation or melena, no indigestion GU:no hematuria, no dysuria MS:no joint pain, no claudication Neuro:no syncope, no lightheadedness Endo:no diabetes, no thyroid disease  Wt Readings from Last 3 Encounters:  06/06/15 180 lb 14.4 oz (82.056 kg)  05/29/15 178 lb (80.74 kg)  05/22/15 178 lb 8 oz (80.967 kg)     PHYSICAL EXAM: VS:  BP 112/70 mmHg  Pulse 68  Ht 5\' 5"  (1.651 m)  Wt 180 lb 14.4 oz (82.056 kg)  BMI 30.10 kg/m2 , BMI Body mass index is  30.1 kg/(m^2). General:Pleasant affect, NAD Skin:Warm and dry, brisk capillary refill HEENT:normocephalic, sclera clear, mucus membranes moist Neck:supple, no JVD, no bruits  Heart:S1S2 RRR without murmur, gallup, rub or click Lungs:clear without rales, rhonchi, or wheezes ZWC:HENI, non tender, + BS, do not palpate liver spleen or masses Ext:no lower ext edema, 2+ pedal pulses, 2+ radial pulses Neuro:alert and oriented, MAE, follows commands, + facial symmetry    EKG:  EKG is NOT ordered today.    Recent Labs: 05/11/2015: ALT 16; BUN 16; Creatinine, Ser 0.83; Hemoglobin 14.2; Platelets 176; Potassium 4.0; Sodium 140    Lipid Panel    Component Value Date/Time   CHOL 189 05/11/2015 0259   TRIG 92 05/11/2015 0259   HDL 45 05/11/2015 0259   CHOLHDL 4.2 05/11/2015 0259   VLDL 18 05/11/2015 0259   LDLCALC 126* 05/11/2015 0259   LDLDIRECT 124.4 09/12/2010 0924       Other studies Reviewed: Additional studies/ records that were reviewed today include: see above.   ASSESSMENT AND PLAN:   1. Atypical chest pain- hospitalized with neg troponins, echo as above, outpt stress study was normal.  Reassured.   2. Palpitation - long standing hx of palpitation. Was on atenolol previously. Stopped by her self. Her frequent PVCs likely due to untreated palpitations. She is back on atenolol at 12.5 mg daily    3. HL - 05/11/2015: Cholesterol  189; HDL 45; LDL Cholesterol 126*; Triglycerides 92; VLDL 18  - Diet control and she wants to start red yeast rice.   She will continue to exercise as well.  4. Mitral valve prolapse (per patient) - Echo 06/2010 showed mild mitral regur. Echo now without prolapse and mild MR .   She will follow up PRN and 1 year with Dr. Martinique.  Current medicines are reviewed with the patient today.  The patient Has no concerns regarding medicines.  The following changes have been made:  See above Labs/ tests ordered today include:see above  Disposition:   FU:  see above  Signed, Isaiah Serge, NP  06/06/2015 1:38 PM    [facility] [place], [place], [place], [place] Phone: [phone]; Fax: [phone]

## 2015-06-06 NOTE — Patient Instructions (Signed)
Your physician recommends that you schedule a follow-up appointment in: 1 year with Dr. Martinique.

## 2015-06-07 ENCOUNTER — Ambulatory Visit: Payer: Managed Care, Other (non HMO)

## 2015-06-26 ENCOUNTER — Encounter: Payer: Self-pay | Admitting: Gynecology

## 2015-06-26 ENCOUNTER — Ambulatory Visit (INDEPENDENT_AMBULATORY_CARE_PROVIDER_SITE_OTHER): Payer: Managed Care, Other (non HMO) | Admitting: Gynecology

## 2015-06-26 VITALS — BP 120/80 | Ht 65.0 in | Wt 179.0 lb

## 2015-06-26 DIAGNOSIS — N952 Postmenopausal atrophic vaginitis: Secondary | ICD-10-CM

## 2015-06-26 DIAGNOSIS — M8580 Other specified disorders of bone density and structure, unspecified site: Secondary | ICD-10-CM

## 2015-06-26 DIAGNOSIS — Z01419 Encounter for gynecological examination (general) (routine) without abnormal findings: Secondary | ICD-10-CM

## 2015-06-26 NOTE — Patient Instructions (Signed)

## 2015-06-26 NOTE — Progress Notes (Signed)
[name] [name] 1951/05/08 [id number]        64 y.o.  G3P3 for annual exam.  Doing well from a gynecologic standpoint.  Past medical history,surgical history, problem list, medications, allergies, family history and social history were all reviewed and documented as reviewed in the EPIC chart.  ROS:  Performed with pertinent positives and negatives included in the history, assessment and plan.   Additional significant findings :  none   Exam: Kim Counsellor Vitals:   06/26/15 1147  BP: 120/80  Height: 5\' 5"  (1.651 m)  Weight: 179 lb (81.194 kg)   General appearance:  Normal affect, orientation and appearance. Skin: Grossly normal HEENT: Without gross lesions.  No cervical or supraclavicular adenopathy. Thyroid normal.  Lungs:  Clear without wheezing, rales or rhonchi Cardiac: RR, without RMG Abdominal:  Soft, nontender, without masses, guarding, rebound, organomegaly or hernia Breasts:  Examined lying and sitting without masses, retractions, discharge or axillary adenopathy. Pelvic:  Ext/BUS/vagina with atrophic changes  Adnexa  Without masses or tenderness    Anus and perineum  Normal   Rectovaginal  Normal sphincter tone without palpated masses or tenderness.    Assessment/Plan:  64 y.o. G3P3 female for annual exam.   1. Postmenopausal. Without significant hot flushes, night sweats, vaginal dryness. Status post TVH for complex hyperplasia with maternal history of uterine cancer. 2. Osteopenia. DEXA 2015 T score -1.6 FRAX 16%/0.8%. Repeat DEXA next year a two-year interval. Increased calcium vitamin D reviewed. 3. Colonoscopy 2016. Being evaluated for right lower quadrant pain. Had negative CT scan to include no adnexal or pelvic pathology noted. Follow up with primary physician if continues. 4. Mammography scheduled next week by her history. SBE monthly reviewed. 5. Pap smear 2015. No Pap smear done today. No history of significant abnormal Pap smears. Status post  hysterectomy for benign indications. Options to stop screening versus less frequent screening intervals reviewed. 6. Health maintenance. No routine blood work done as she reports is done at her primary physician's office. Follow up 1 year, sooner as needed.   Anastasio Auerbach MD, 12:17 PM 06/26/2015

## 2015-06-27 LAB — URINALYSIS W MICROSCOPIC + REFLEX CULTURE
BILIRUBIN URINE: NEGATIVE
Casts: NONE SEEN [LPF]
Crystals: NONE SEEN [HPF]
GLUCOSE, UA: NEGATIVE
HGB URINE DIPSTICK: NEGATIVE
KETONES UR: NEGATIVE
Nitrite: POSITIVE — AB
PROTEIN: NEGATIVE
Specific Gravity, Urine: 1.022 (ref 1.001–1.035)
Yeast: NONE SEEN [HPF]
pH: 5.5 (ref 5.0–8.0)

## 2015-06-29 ENCOUNTER — Other Ambulatory Visit: Payer: Self-pay | Admitting: Gynecology

## 2015-06-29 LAB — URINE CULTURE

## 2015-06-29 MED ORDER — SULFAMETHOXAZOLE-TRIMETHOPRIM 800-160 MG PO TABS: 1.0000 | ORAL_TABLET | Freq: Two times a day (BID) | ORAL | Status: DC

## 2015-07-03 ENCOUNTER — Encounter: Payer: Managed Care, Other (non HMO) | Admitting: Internal Medicine

## 2015-07-05 ENCOUNTER — Ambulatory Visit
Admission: RE | Admit: 2015-07-05 | Discharge: 2015-07-05 | Disposition: A | Discharge status: Home / Self Care | Payer: Managed Care, Other (non HMO) | Source: Ambulatory Visit

## 2015-07-05 DIAGNOSIS — Z1231 Encounter for screening mammogram for malignant neoplasm of breast: Secondary | ICD-10-CM

## 2015-09-26 ENCOUNTER — Telehealth: Payer: Self-pay | Admitting: Family Medicine

## 2015-09-26 DIAGNOSIS — Z0000 Encounter for general adult medical examination without abnormal findings: Secondary | ICD-10-CM

## 2015-09-26 DIAGNOSIS — E559 Vitamin D deficiency, unspecified: Secondary | ICD-10-CM

## 2015-09-26 NOTE — Telephone Encounter (Signed)
-----   Message from Ellamae Sia sent at 09/21/2015 10:12 AM EST ----- Regarding: Lab orders for Wednesday, 12.21.16 Patient is scheduled for CPX labs, please order future labs, Thanks , Karna Christmas

## 2015-09-28 ENCOUNTER — Other Ambulatory Visit: Payer: Self-pay | Admitting: Family Medicine

## 2015-09-28 ENCOUNTER — Other Ambulatory Visit (INDEPENDENT_AMBULATORY_CARE_PROVIDER_SITE_OTHER): Payer: Managed Care, Other (non HMO)

## 2015-09-28 DIAGNOSIS — Z1159 Encounter for screening for other viral diseases: Secondary | ICD-10-CM | POA: Diagnosis not present

## 2015-09-28 DIAGNOSIS — E559 Vitamin D deficiency, unspecified: Secondary | ICD-10-CM

## 2015-09-28 DIAGNOSIS — Z0000 Encounter for general adult medical examination without abnormal findings: Secondary | ICD-10-CM | POA: Diagnosis not present

## 2015-09-28 LAB — CBC WITH DIFFERENTIAL/PLATELET
BASOS ABS: 0 10*3/uL (ref 0.0–0.1)
Basophils Relative: 0.4 % (ref 0.0–3.0)
Eosinophils Absolute: 0.1 10*3/uL (ref 0.0–0.7)
Eosinophils Relative: 2.5 % (ref 0.0–5.0)
HEMATOCRIT: 45.3 % (ref 36.0–46.0)
HEMOGLOBIN: 15.4 g/dL — AB (ref 12.0–15.0)
LYMPHS ABS: 1.9 10*3/uL (ref 0.7–4.0)
LYMPHS PCT: 34.4 % (ref 12.0–46.0)
MCHC: 34 g/dL (ref 30.0–36.0)
MCV: 94.7 fl (ref 78.0–100.0)
MONOS PCT: 7.4 % (ref 3.0–12.0)
Monocytes Absolute: 0.4 10*3/uL (ref 0.1–1.0)
NEUTROS PCT: 55.3 % (ref 43.0–77.0)
Neutro Abs: 3 10*3/uL (ref 1.4–7.7)
Platelets: 224 10*3/uL (ref 150.0–400.0)
RBC: 4.78 Mil/uL (ref 3.87–5.11)
RDW: 13.3 % (ref 11.5–15.5)
WBC: 5.5 10*3/uL (ref 4.0–10.5)

## 2015-09-28 LAB — LIPID PANEL
CHOLESTEROL: 185 mg/dL (ref 0–200)
HDL: 47.8 mg/dL (ref >39.00)
LDL Cholesterol: 102 mg/dL — ABNORMAL HIGH (ref 0–99)
NonHDL: 137.64
TRIGLYCERIDES: 176 mg/dL — AB (ref 0.0–149.0)
Total CHOL/HDL Ratio: 4
VLDL: 35.2 mg/dL (ref 0.0–40.0)

## 2015-09-28 LAB — COMPREHENSIVE METABOLIC PANEL
ALBUMIN: 4.2 g/dL (ref 3.5–5.2)
ALK PHOS: 74 U/L (ref 39–117)
ALT: 17 U/L (ref 0–35)
AST: 24 U/L (ref 0–37)
BUN: 13 mg/dL (ref 6–23)
CO2: 31 mEq/L (ref 19–32)
Calcium: 10.2 mg/dL (ref 8.4–10.5)
Chloride: 104 mEq/L (ref 96–112)
Creatinine, Ser: 0.87 mg/dL (ref 0.40–1.20)
GFR: 69.78 mL/min (ref >60.00)
Glucose, Bld: 86 mg/dL (ref 70–99)
POTASSIUM: 3.8 meq/L (ref 3.5–5.1)
Sodium: 141 mEq/L (ref 135–145)
TOTAL PROTEIN: 7 g/dL (ref 6.0–8.3)
Total Bilirubin: 0.6 mg/dL (ref 0.2–1.2)

## 2015-09-28 LAB — VITAMIN D 25 HYDROXY (VIT D DEFICIENCY, FRACTURES): VITD: 37.93 ng/mL (ref 30.00–100.00)

## 2015-09-28 LAB — TSH: TSH: 3.05 u[IU]/mL (ref 0.35–4.50)

## 2015-09-29 ENCOUNTER — Other Ambulatory Visit: Payer: Managed Care, Other (non HMO)

## 2015-09-29 LAB — HEPATITIS C ANTIBODY: HCV Ab: NEGATIVE

## 2015-10-06 ENCOUNTER — Encounter: Payer: Self-pay | Admitting: Family Medicine

## 2015-10-06 ENCOUNTER — Ambulatory Visit (INDEPENDENT_AMBULATORY_CARE_PROVIDER_SITE_OTHER): Payer: Managed Care, Other (non HMO) | Admitting: Family Medicine

## 2015-10-06 VITALS — BP 110/70 | HR 80 | Temp 98.0°F | Ht 65.0 in | Wt 177.0 lb

## 2015-10-06 DIAGNOSIS — M8580 Other specified disorders of bone density and structure, unspecified site: Secondary | ICD-10-CM

## 2015-10-06 DIAGNOSIS — Z0000 Encounter for general adult medical examination without abnormal findings: Secondary | ICD-10-CM | POA: Diagnosis not present

## 2015-10-06 DIAGNOSIS — E7800 Pure hypercholesterolemia, unspecified: Secondary | ICD-10-CM

## 2015-10-06 DIAGNOSIS — E559 Vitamin D deficiency, unspecified: Secondary | ICD-10-CM | POA: Diagnosis not present

## 2015-10-06 DIAGNOSIS — Z23 Encounter for immunization: Secondary | ICD-10-CM

## 2015-10-06 NOTE — Assessment & Plan Note (Signed)
Reviewed health habits including diet and exercise and skin cancer prevention Reviewed appropriate screening tests for age  Also reviewed health mt list, fam hx and immunization status , as well as social and family history   See HPI Labs reviewed Flu shot today  Take vitamin D 2000 iu daily to raise your vitamin D level  Keep exercising /stay active Eat a healthy diet - less sugar- try to cut the sweet tea

## 2015-10-06 NOTE — Patient Instructions (Signed)
Flu shot today  Take vitamin D 2000 iu daily to raise your vitamin D level  Keep exercising /stay active Eat a healthy diet - less sugar- try to cut the sweet tea

## 2015-10-06 NOTE — Assessment & Plan Note (Signed)
Level in 35s Enc her to get back on 2000 iu every day Disc imp to bone and overall health Pt has osteopenia

## 2015-10-06 NOTE — Assessment & Plan Note (Signed)
Disc goals for lipids and reasons to control them Rev labs with pt Rev low sat fat diet in detail Improved Enc dec sugar in diet in light of inc triglycerides (will try to cut sweet tea)

## 2015-10-06 NOTE — Progress Notes (Signed)
Pre visit review using our clinic review tool, if applicable. No additional management support is needed unless otherwise documented below in the visit note. 

## 2015-10-06 NOTE — Assessment & Plan Note (Signed)
dexa 3/15 at gyn - last one was worse at hips and stable in spine  No falls or fx On ca and D Enc more regular D intake   Disc need for calcium/ vitamin D/ wt bearing exercise and bone density test every 2 y to monitor Disc safety/ fracture risk in detail

## 2015-10-06 NOTE — Progress Notes (Signed)
Subjective:    Patient ID: [name] [name], female    DOB: 04/14/1951, 64 y.o.   MRN: [id number]  HPI Here for health maintenance exam and to review chronic medical problems    Has been feeling ok overall   Started taking Human Growth hormone - over the counter - was taking it to increase energy  It may make her irritable  ? If it helps    Wt is down 2 lb with bmi of 29 Even over [place] exercise bike - for her leg/and walks   HIV screen-not interested/not high risk  Hep C screen 12/16 nl   Flu vaccine = wants to get today   Td 4/07  Pap 9/15 nl  Has had a hysterectomy  fam hx of uterine cancer Sees gyn   Mm 9/16 nl  Self breast exam- no lumps or changes (cyst is stable)-sees gyn  Colonoscopy 8/16 nl - with 10 year recall   Zoster vaccine 2/16 dexa  at [facility] gyn- osteopenia -worse in hips and stable in spine 3/15  No falls or broken bones  D level is 37.9-she does not take it regularly   Hyperlipidemia Lab Results  Component Value Date   CHOL 185 09/28/2015   CHOL 189 05/11/2015   CHOL 194 11/22/2013   Lab Results  Component Value Date   HDL 47.80 09/28/2015   HDL 45 05/11/2015   HDL 53.90 11/22/2013   Lab Results  Component Value Date   LDLCALC 102* 09/28/2015   LDLCALC 126* 05/11/2015   LDLCALC 120* 11/22/2013   Lab Results  Component Value Date   TRIG 176.0* 09/28/2015   TRIG 92 05/11/2015   TRIG 100.0 11/22/2013   Lab Results  Component Value Date   CHOLHDL 4 09/28/2015   CHOLHDL 4.2 05/11/2015   CHOLHDL 4 11/22/2013   Lab Results  Component Value Date   LDLDIRECT 124.4 09/12/2010   diet controlled  Trig up - has had more sweet tea /more sweets around the holidays  HDL is up and LDL is down  Overall improved      Results for orders placed or performed in visit on 09/28/15  CBC with Differential/Platelet  Result Value Ref Range   WBC 5.5 4.0 - 10.5 K/uL   RBC 4.78 3.87 - 5.11 Mil/uL   Hemoglobin 15.4 (H) 12.0 -  15.0 g/dL   HCT 45.3 36.0 - 46.0 %   MCV 94.7 78.0 - 100.0 fl   MCHC 34.0 30.0 - 36.0 g/dL   RDW 13.3 11.5 - 15.5 %   Platelets 224.0 150.0 - 400.0 K/uL   Neutrophils Relative % 55.3 43.0 - 77.0 %   Lymphocytes Relative 34.4 12.0 - 46.0 %   Monocytes Relative 7.4 3.0 - 12.0 %   Eosinophils Relative 2.5 0.0 - 5.0 %   Basophils Relative 0.4 0.0 - 3.0 %   Neutro Abs 3.0 1.4 - 7.7 K/uL   Lymphs Abs 1.9 0.7 - 4.0 K/uL   Monocytes Absolute 0.4 0.1 - 1.0 K/uL   Eosinophils Absolute 0.1 0.0 - 0.7 K/uL   Basophils Absolute 0.0 0.0 - 0.1 K/uL  Comprehensive metabolic panel  Result Value Ref Range   Sodium 141 135 - 145 mEq/L   Potassium 3.8 3.5 - 5.1 mEq/L   Chloride 104 96 - 112 mEq/L   CO2 31 19 - 32 mEq/L   Glucose, Bld 86 70 - 99 mg/dL   BUN 13 6 - 23 mg/dL   Creatinine,  Ser 0.87 0.40 - 1.20 mg/dL   Total Bilirubin 0.6 0.2 - 1.2 mg/dL   Alkaline Phosphatase 74 39 - 117 U/L   AST 24 0 - 37 U/L   ALT 17 0 - 35 U/L   Total Protein 7.0 6.0 - 8.3 g/dL   Albumin 4.2 3.5 - 5.2 g/dL   Calcium 10.2 8.4 - 10.5 mg/dL   GFR 69.78 >60.00 mL/min  Lipid panel  Result Value Ref Range   Cholesterol 185 0 - 200 mg/dL   Triglycerides 176.0 (H) 0.0 - 149.0 mg/dL   HDL 47.80 >39.00 mg/dL   VLDL 35.2 0.0 - 40.0 mg/dL   LDL Cholesterol 102 (H) 0 - 99 mg/dL   Total CHOL/HDL Ratio 4    NonHDL 137.64   TSH  Result Value Ref Range   TSH 3.05 0.35 - 4.50 uIU/mL  VITAMIN D 25 Hydroxy (Vit-D Deficiency, Fractures)  Result Value Ref Range   VITD 37.93 30.00 - 100.00 ng/mL    Reviewed with pt     Patient Active Problem List   Diagnosis Date Noted  . Palpitations 05/22/2015  . Pain in the chest   . Viral URI with cough 01/16/2015  . Sore throat 01/16/2015  . OA (osteoarthritis) of knee 11/21/2014  . UTI (urinary tract infection) 07/25/2014  . Colon cancer screening 11/29/2013  . Routine general medical examination at a health care facility 09/16/2011  . DERMATITIS, ATOPIC 11/27/2010  .  URTICARIA DUE TO COLD OR HEAT 11/27/2010  . STRESS REACTION, ACUTE, WITH EMOTIONAL DISTURBANCE 09/14/2010  . THROAT PAIN, CHRONIC 09/14/2010  . EXTERNAL HEMORRHOIDS WITHOUT MENTION COMP 06/25/2010  . ABNORMAL EKG 12/26/2009  . OTHER CHRONIC SINUSITIS 10/19/2009  . Vitamin D deficiency 04/21/2009  . ALLERGIC RHINITIS 11/09/2008  . GERD 07/28/2008  . Osteopenia 03/14/2008  . HYPERCHOLESTEROLEMIA, PURE 03/13/2007  . HIATAL HERNIA 01/12/2007   Past Medical History  Diagnosis Date  . Hyperlipidemia DIET CONTROL  . Eczema   . Chest pain 9/11    [facility]) and palpitations - ruled out for MI with nl. Echo  . Heart palpitations OCCASIONAL--  TAKES ATENOLOL PRN  . Normal nuclear stress test 07-09-10  . Complication of anesthesia HARD TO WAKE  . Hiatal hernia   . Osteopenia 12/2013    T score -1.6 FRAX 16%/0.8%  . HSV-2 infection     buttocks  . Breast cyst     "left"  . PONV (postoperative nausea and vomiting)   . MVP (mitral valve prolapse)     per pt no treatment needed  . Difficulty sleeping   . Acute meniscal tear of knee   . GERD (gastroesophageal reflux disease)   . 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 Melanoma of skin ([place])   . Basal cell cancer 5/08   Past Surgical History  Procedure Laterality Date  . Mri neck  3/00    C4-C5 herniation small stenosis mild C4-5, C5-6  . Melanoma excision Right 1995    "side"  . Carotid US  9/05    Mild, recheck 1 year  . Carotid US  10/06    Normal  . Esophagogastroduodenoscopy  9/07    Normal  . Mole removal      "several; all over my body"  . Hysteroscopy with resectoscope  2000    POLPECTOMY'S  . Tubal ligation  1989  . Tonsillectomy    . Transthoracic echocardiogram  06-29-2010    NORMAL LVSF, EF 55-60%,  MILD MR  . Knee arthroscopy  10/23/2011    Procedure: ARTHROSCOPY KNEE;  Surgeon: Gearlean Alf, MD;  Location: [facility];  Service: Orthopedics;  Laterality: Left;  LEFT KNEE  SCOPE WITH DEBRIDEMENT   . Chondroplasty  10/23/2011    Procedure: CHONDROPLASTY;  Surgeon: Gearlean Alf, MD;  Location: [facility];  Service: Orthopedics;  Laterality: Left;  Left medial   . Vaginal hysterectomy  2001    complex hyperplasia with mother's history of uterine cancer  . Total knee arthroplasty Left 11/21/2014    Procedure: LEFT TOTAL KNEE ARTHROPLASTY;  Surgeon: Gearlean Alf, MD;  Location: [facility];  Service: Orthopedics;  Laterality: Left;  . Joint replacement    . Mohs surgery Left     "side of my nose"  . Colonoscopy     Social History  Substance Use Topics  . Smoking status: Former Smoker -- 1.00 packs/day for 15 years    Types: Cigarettes    Quit date: 10/07/1978  . Smokeless tobacco: Never Used  . Alcohol Use: No   Family History  Problem Relation Age of Onset  . Cancer Mother     uterine  . Obesity Mother   . Thrombocytopenia Mother   . Diabetes Mother   . Alcohol abuse Father   . Heart disease Father     CAD in 80's  . Emphysema Father   . Heart disease Paternal Aunt     CAD  . Heart disease Paternal Uncle     CAD  . Colon cancer Neg Hx   . Diabetes Maternal Grandmother     type 2  . Diabetes Maternal Grandfather     type 2   Allergies  Allergen Reactions  . Amoxicillin Rash  . Codeine Rash  . Clarithromycin Other (See Comments)    REACTION: reaction not known   Current Outpatient Prescriptions on File Prior to Visit  Medication Sig Dispense Refill  . Ascorbic Acid (VITAMIN C PO) Take 1 tablet by mouth daily.    Marland Kitchen aspirin EC 81 MG tablet Take 1 tablet (81 mg total) by mouth daily.    Marland Kitchen atenolol (TENORMIN) 25 MG tablet Take 0.5 tablets (12.5 mg total) by mouth daily. 30 tablet 11  . CALCIUM CARBONATE PO Take 1 tablet by mouth daily.    . Cholecalciferol (VITAMIN D PO) Take 1 tablet by mouth daily.    Marland Kitchen CINNAMON PO Take 1 capsule by mouth. Takes occasionally    . CYANOCOBALAMIN PO Take 1 tablet by mouth daily.    Marland Kitchen KRILL  OIL PO Take by mouth. Takes 1 tab occasionally    . magnesium gluconate (MAGONATE) 500 MG tablet Take 500 mg by mouth daily.    . Multiple Vitamin (MULTIVITAMIN) capsule Take 1 capsule by mouth daily.    Vladimir Faster Glycol-Propyl Glycol (SYSTANE OP) Apply 1 drop to eye daily as needed (Dry eyes).    Marland Kitchen ZOVIRAX 5 % APPLY DAILY AS NEEDED  3   No current facility-administered medications on file prior to visit.     Review of Systems Review of Systems  Constitutional: Negative for fever, appetite change,  and unexpected weight change. pos for gen fatigue  Eyes: Negative for pain and visual disturbance.  Respiratory: Negative for cough and shortness of breath.   Cardiovascular: Negative for cp or palpitations    Gastrointestinal: Negative for nausea, diarrhea and constipation.  Genitourinary: Negative for urgency and frequency.  Skin: Negative for pallor or  rash   Neurological: Negative for weakness, light-headedness, numbness and headaches.  Hematological: Negative for adenopathy. Does not bruise/bleed easily.  Psychiatric/Behavioral: Negative for dysphoric mood. The patient is not nervous/anxious.         Objective:   Physical Exam  Constitutional: She appears well-developed and well-nourished. No distress.  overwt and well appearing   HENT:  Head: Normocephalic and atraumatic.  Right Ear: External ear normal.  Left Ear: External ear normal.  Nose: Nose normal.  Mouth/Throat: Oropharynx is clear and moist.  Eyes: Conjunctivae and EOM are normal. Pupils are equal, round, and reactive to light. Right eye exhibits no discharge. Left eye exhibits no discharge. No scleral icterus.  Neck: Normal range of motion. Neck supple. No JVD present. Carotid bruit is not present. No thyromegaly present.  Cardiovascular: Normal rate, regular rhythm, normal heart sounds and intact distal pulses.  Exam reveals no gallop.   Pulmonary/Chest: Effort normal and breath sounds normal. No respiratory  distress. She has no wheezes. She has no rales.  Abdominal: Soft. Bowel sounds are normal. She exhibits no distension and no mass. There is no tenderness.  Musculoskeletal: She exhibits no edema or tenderness.  Lymphadenopathy:    She has no cervical adenopathy.  Neurological: She is alert. She has normal reflexes. No cranial nerve deficit. She exhibits normal muscle tone. Coordination normal.  Skin: Skin is warm and dry. No rash noted. No erythema. No pallor.  Some pale SKs and few lentigo  Psychiatric: She has a normal mood and affect.          Assessment & Plan:   Problem List Items Addressed This Visit      Musculoskeletal and Integument   Osteopenia - Primary    dexa 3/15 at gyn - last one was worse at hips and stable in spine  No falls or fx On ca and D Enc more regular D intake   Disc need for calcium/ vitamin D/ wt bearing exercise and bone density test every 2 y to monitor Disc safety/ fracture risk in detail          Other   HYPERCHOLESTEROLEMIA, PURE    Disc goals for lipids and reasons to control them Rev labs with pt Rev low sat fat diet in detail Improved Enc dec sugar in diet in light of inc triglycerides (will try to cut sweet tea)       Routine general medical examination at a health care facility    Reviewed health habits including diet and exercise and skin cancer prevention Reviewed appropriate screening tests for age  Also reviewed health mt list, fam hx and immunization status , as well as social and family history   See HPI Labs reviewed Flu shot today  Take vitamin D 2000 iu daily to raise your vitamin D level  Keep exercising /stay active Eat a healthy diet - less sugar- try to cut the sweet tea        Vitamin D deficiency    Level in 89s Enc her to get back on 2000 iu every day Disc imp to bone and overall health Pt has osteopenia        Other Visit Diagnoses    Need for influenza vaccination        Relevant Orders    Flu  Vaccine QUAD 36+ mos PF IM (Fluarix & Fluzone Quad PF) (Completed)

## 2016-03-08 ENCOUNTER — Ambulatory Visit (INDEPENDENT_AMBULATORY_CARE_PROVIDER_SITE_OTHER): Payer: Managed Care, Other (non HMO) | Admitting: Family Medicine

## 2016-03-08 ENCOUNTER — Encounter: Payer: Self-pay | Admitting: Family Medicine

## 2016-03-08 VITALS — BP 106/62 | HR 63 | Temp 97.9°F | Ht 65.0 in | Wt 177.5 lb

## 2016-03-08 DIAGNOSIS — N390 Urinary tract infection, site not specified: Secondary | ICD-10-CM | POA: Insufficient documentation

## 2016-03-08 DIAGNOSIS — R300 Dysuria: Secondary | ICD-10-CM | POA: Diagnosis not present

## 2016-03-08 DIAGNOSIS — N3000 Acute cystitis without hematuria: Secondary | ICD-10-CM

## 2016-03-08 LAB — POCT UA - MICROSCOPIC ONLY

## 2016-03-08 LAB — POC URINALSYSI DIPSTICK (AUTOMATED)
Bilirubin, UA: NEGATIVE
Glucose, UA: NEGATIVE
Ketones, UA: NEGATIVE
LEUKOCYTES UA: NEGATIVE
NITRITE UA: NEGATIVE
PH UA: 6
PROTEIN UA: NEGATIVE
Spec Grav, UA: 1.025
UROBILINOGEN UA: 0.2

## 2016-03-08 MED ORDER — SULFAMETHOXAZOLE-TRIMETHOPRIM 800-160 MG PO TABS: 1.0000 | ORAL_TABLET | Freq: Two times a day (BID) | ORAL | Status: DC

## 2016-03-08 NOTE — Progress Notes (Signed)
Pre visit review using our clinic review tool, if applicable. No additional management support is needed unless otherwise documented below in the visit note. 

## 2016-03-08 NOTE — Patient Instructions (Signed)
Drink lots of water  Take the septra as directed  We will culture urine and get a result next week  Update if not starting to improve in a week or if worsening

## 2016-03-08 NOTE — Progress Notes (Signed)
Subjective:    Patient ID: [name] [name], female    DOB: 04/03/1951, 65 y.o.   MRN: [id number]  HPI Here for uti symptoms   Started early this week  Hurts to urinate/burns  Feeling yucky in general  Some pain around bladder / pressure   No more back pain than usual No fever  No nausea  Trying to drink fluids   UA today- trace blood   She drank sprite once -they tend to affect her   Results for orders placed or performed in visit on 03/08/16  Urine culture  Result Value Ref Range   Colony Count 6,000 COLONIES/ML    Organism ID, Bacteria Insignificant Growth   POCT Urinalysis Dipstick (Automated)  Result Value Ref Range   Color, UA Yellow    Clarity, UA Clear    Glucose, UA Negative    Bilirubin, UA Negative    Ketones, UA Negative    Spec Grav, UA 1.025    Blood, UA Trace    pH, UA 6.0    Protein, UA Negative    Urobilinogen, UA 0.2    Nitrite, UA Negative    Leukocytes, UA Negative Negative  POCT UA - Microscopic Only  Result Value Ref Range   WBC, Ur, HPF, POC 2-4    RBC, urine, microscopic 2-3    Bacteria, U Microscopic few    Mucus, UA few    Epithelial cells, urine per micros few    Crystals, Ur, HPF, POC few    Casts, Ur, LPF, POC none    Yeast, UA none      Patient Active Problem List   Diagnosis Date Noted  . UTI (urinary tract infection) 03/08/2016  . Palpitations 05/22/2015  . Pain in the chest   . Viral URI with cough 01/16/2015  . Sore throat 01/16/2015  . OA (osteoarthritis) of knee 11/21/2014  . Colon cancer screening 11/29/2013  . Routine general medical examination at a health care facility 09/16/2011  . DERMATITIS, ATOPIC 11/27/2010  . URTICARIA DUE TO COLD OR HEAT 11/27/2010  . STRESS REACTION, ACUTE, WITH EMOTIONAL DISTURBANCE 09/14/2010  . THROAT PAIN, CHRONIC 09/14/2010  . EXTERNAL HEMORRHOIDS WITHOUT MENTION COMP 06/25/2010  . ABNORMAL EKG 12/26/2009  . OTHER CHRONIC SINUSITIS 10/19/2009  . Vitamin D deficiency 04/21/2009    . ALLERGIC RHINITIS 11/09/2008  . GERD 07/28/2008  . Osteopenia 03/14/2008  . HYPERCHOLESTEROLEMIA, PURE 03/13/2007  . HIATAL HERNIA 01/12/2007   Past Medical History  Diagnosis Date  . Hyperlipidemia DIET CONTROL  . Eczema   . Chest pain 9/11    [facility]) and palpitations - ruled out for MI with nl. Echo  . Heart palpitations OCCASIONAL--  TAKES ATENOLOL PRN  . Normal nuclear stress test 07-09-10  . Complication of anesthesia HARD TO WAKE  . Hiatal hernia   . Osteopenia 12/2013    T score -1.6 FRAX 16%/0.8%  . HSV-2 infection     buttocks  . Breast cyst     "left"  . PONV (postoperative nausea and vomiting)   . MVP (mitral valve prolapse)     per pt no treatment needed  . Difficulty sleeping   . Acute meniscal tear of knee   . GERD (gastroesophageal reflux disease)   . 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 Melanoma of skin ([place])   . Basal cell cancer 5/08   Past Surgical History  Procedure Laterality Date  . Mri neck  3/00  C4-C5 herniation small stenosis mild C4-5, C5-6  . Melanoma excision Right 1995    "side"  . Carotid US  9/05    Mild, recheck 1 year  . Carotid US  10/06    Normal  . Esophagogastroduodenoscopy  9/07    Normal  . Mole removal      "several; all over my body"  . Hysteroscopy with resectoscope  2000    POLPECTOMY'S  . Tubal ligation  1989  . Tonsillectomy    . Transthoracic echocardiogram  06-29-2010    NORMAL LVSF, EF 55-60%,  MILD MR  . Knee arthroscopy  10/23/2011    Procedure: ARTHROSCOPY KNEE;  Surgeon: Gearlean Alf, MD;  Location: [facility];  Service: Orthopedics;  Laterality: Left;  LEFT KNEE SCOPE WITH DEBRIDEMENT   . Chondroplasty  10/23/2011    Procedure: CHONDROPLASTY;  Surgeon: Gearlean Alf, MD;  Location: [facility];  Service: Orthopedics;  Laterality: Left;  Left medial   . Vaginal hysterectomy  2001    complex hyperplasia with mother's history of uterine  cancer  . Total knee arthroplasty Left 11/21/2014    Procedure: LEFT TOTAL KNEE ARTHROPLASTY;  Surgeon: Gearlean Alf, MD;  Location: [facility];  Service: Orthopedics;  Laterality: Left;  . Joint replacement    . Mohs surgery Left     "side of my nose"  . Colonoscopy     Social History  Substance Use Topics  . Smoking status: Former Smoker -- 1.00 packs/day for 15 years    Types: Cigarettes    Quit date: 10/07/1978  . Smokeless tobacco: Never Used  . Alcohol Use: No   Family History  Problem Relation Age of Onset  . Cancer Mother     uterine  . Obesity Mother   . Thrombocytopenia Mother   . Diabetes Mother   . Alcohol abuse Father   . Heart disease Father     CAD in 33's  . Emphysema Father   . Heart disease Paternal Aunt     CAD  . Heart disease Paternal Uncle     CAD  . Colon cancer Neg Hx   . Diabetes Maternal Grandmother     type 2  . Diabetes Maternal Grandfather     type 2   Allergies  Allergen Reactions  . Amoxicillin Rash  . Codeine Rash  . Clarithromycin Other (See Comments)    REACTION: reaction not known   Current Outpatient Prescriptions on File Prior to Visit  Medication Sig Dispense Refill  . Ascorbic Acid (VITAMIN C PO) Take 1 tablet by mouth daily.    Marland Kitchen aspirin EC 81 MG tablet Take 1 tablet (81 mg total) by mouth daily.    Marland Kitchen atenolol (TENORMIN) 25 MG tablet Take 0.5 tablets (12.5 mg total) by mouth daily. 30 tablet 11  . CALCIUM CARBONATE PO Take 1 tablet by mouth daily.    . Cholecalciferol (VITAMIN D PO) Take 1 tablet by mouth daily.    Marland Kitchen CINNAMON PO Take 1 capsule by mouth. Takes occasionally    . CYANOCOBALAMIN PO Take 1 tablet by mouth daily.    Marland Kitchen KRILL OIL PO Take by mouth. Takes 1 tab occasionally    . magnesium gluconate (MAGONATE) 500 MG tablet Take 500 mg by mouth daily.    . Multiple Vitamin (MULTIVITAMIN) capsule Take 1 capsule by mouth daily.    Vladimir Faster Glycol-Propyl Glycol (SYSTANE OP) Apply 1 drop to eye daily as needed (Dry  eyes).    Marland Kitchen ZOVIRAX 5 % APPLY DAILY AS NEEDED  3   No current facility-administered medications on file prior to visit.    Review of Systems  Constitutional: Positive for fatigue. Negative for fever, activity change and appetite change.  HENT: Negative for congestion and sore throat.   Eyes: Negative for itching and visual disturbance.  Respiratory: Negative for cough and shortness of breath.   Cardiovascular: Negative for leg swelling.  Gastrointestinal: Negative for nausea, abdominal pain, diarrhea, constipation and abdominal distention.  Endocrine: Negative for cold intolerance and polydipsia.  Genitourinary: Positive for dysuria, urgency and frequency. Negative for hematuria, flank pain and difficulty urinating.  Musculoskeletal: Negative for myalgias.  Skin: Negative for rash.  Allergic/Immunologic: Negative for immunocompromised state.  Neurological: Negative for dizziness and weakness.  Hematological: Negative for adenopathy.       Objective:   Physical Exam  Constitutional: She appears well-developed and well-nourished. No distress.  Well appearing   HENT:  Head: Normocephalic and atraumatic.  Eyes: Conjunctivae and EOM are normal. Pupils are equal, round, and reactive to light.  Neck: Normal range of motion. Neck supple.  Cardiovascular: Normal rate, regular rhythm and normal heart sounds.   Pulmonary/Chest: Effort normal and breath sounds normal.  Abdominal: Soft. Bowel sounds are normal. She exhibits no distension. There is tenderness. There is no rebound.  No cva tenderness  Mild suprapubic tenderness  Musculoskeletal: She exhibits no edema.  Lymphadenopathy:    She has no cervical adenopathy.  Neurological: She is alert.  Skin: No rash noted.  Psychiatric: She has a normal mood and affect.          Assessment & Plan:   Problem List Items Addressed This Visit      Genitourinary   UTI (urinary tract infection) - Primary    ua is borderline- pt has inc  fluid intake and symptoms improved  tx with 5 d of septra ucx pending Disc symptomatic care - see instructions on AVS  Update if not starting to improve in a week or if worsening   Enc continued good water intake      Relevant Medications   sulfamethoxazole-trimethoprim (BACTRIM DS,SEPTRA DS) 800-160 MG tablet   Other Relevant Orders   Urine culture (Completed)   POCT UA - Microscopic Only (Completed)    Other Visit Diagnoses    Dysuria        Relevant Orders    POCT Urinalysis Dipstick (Automated) (Completed)

## 2016-03-10 LAB — URINE CULTURE: Colony Count: 6000

## 2016-03-10 NOTE — Assessment & Plan Note (Signed)
ua is borderline- pt has inc fluid intake and symptoms improved  tx with 5 d of septra ucx pending Disc symptomatic care - see instructions on AVS  Update if not starting to improve in a week or if worsening   Enc continued good water intake

## 2016-04-29 ENCOUNTER — Other Ambulatory Visit: Payer: Self-pay | Admitting: Gynecology

## 2016-04-29 DIAGNOSIS — Z1231 Encounter for screening mammogram for malignant neoplasm of breast: Secondary | ICD-10-CM

## 2016-06-07 ENCOUNTER — Encounter: Payer: Self-pay | Admitting: Family Medicine

## 2016-06-07 ENCOUNTER — Ambulatory Visit (INDEPENDENT_AMBULATORY_CARE_PROVIDER_SITE_OTHER): Payer: Managed Care, Other (non HMO) | Admitting: Family Medicine

## 2016-06-07 VITALS — BP 110/72 | HR 67 | Temp 97.7°F | Ht 65.0 in | Wt 179.4 lb

## 2016-06-07 DIAGNOSIS — R350 Frequency of micturition: Secondary | ICD-10-CM | POA: Diagnosis not present

## 2016-06-07 LAB — POCT UA - MICROSCOPIC ONLY

## 2016-06-07 LAB — POC URINALSYSI DIPSTICK (AUTOMATED)
Bilirubin, UA: NEGATIVE
GLUCOSE UA: NEGATIVE
Ketones, UA: NEGATIVE
NITRITE UA: NEGATIVE
PH UA: 6
PROTEIN UA: NEGATIVE
SPEC GRAV UA: 1.015
UROBILINOGEN UA: 0.2

## 2016-06-07 MED ORDER — NITROFURANTOIN MONOHYD MACRO 100 MG PO CAPS: 100.0000 mg | ORAL_CAPSULE | Freq: Two times a day (BID) | ORAL | 0 refills | Status: DC

## 2016-06-07 NOTE — Progress Notes (Signed)
Pre visit review using our clinic review tool, if applicable. No additional management support is needed unless otherwise documented below in the visit note. 

## 2016-06-07 NOTE — Progress Notes (Signed)
   Subjective:    Patient ID: [name] [name], female    DOB: 1951/06/06, 65 y.o.   MRN: [id number]   Hx of Klebsiella in 2016,treated with ? By GYN.  insignificant bacteria on 03/2016 culture, treated with bactrim.   Urinary Frequency   This is a new problem. The current episode started 1 to 4 weeks ago. The problem has been waxing and waning. The quality of the pain is described as burning. The pain is at a severity of 5/10. The pain is moderate. There has been no fever. She is sexually active. There is no history of pyelonephritis. Associated symptoms include frequency and urgency. Pertinent negatives include no discharge, flank pain, hematuria, hesitancy, nausea or vomiting. She has tried increased fluids for the symptoms. The treatment provided no relief. There is no history of catheterization, kidney stones, a single kidney, urinary stasis or a urological procedure.      Review of Systems  Gastrointestinal: Negative for nausea and vomiting.  Genitourinary: Positive for frequency and urgency. Negative for flank pain, hematuria and hesitancy.       Objective:   Physical Exam  Constitutional: Vital signs are normal. She appears well-developed and well-nourished. She is cooperative.  Non-toxic appearance. She does not appear ill. No distress.  HENT:  Head: Normocephalic.  Right Ear: Hearing, tympanic membrane, external ear and ear canal normal. Tympanic membrane is not erythematous, not retracted and not bulging.  Left Ear: Hearing, tympanic membrane, external ear and ear canal normal. Tympanic membrane is not erythematous, not retracted and not bulging.  Nose: No mucosal edema or rhinorrhea. Right sinus exhibits no maxillary sinus tenderness and no frontal sinus tenderness. Left sinus exhibits no maxillary sinus tenderness and no frontal sinus tenderness.  Mouth/Throat: Uvula is midline, oropharynx is clear and moist and mucous membranes are normal.  Eyes: Conjunctivae, EOM and lids are  normal. Pupils are equal, round, and reactive to light. Lids are everted and swept, no foreign bodies found.  Neck: Trachea normal and normal range of motion. Neck supple. Carotid bruit is not present. No thyroid mass and no thyromegaly present.  Cardiovascular: Normal rate, regular rhythm, S1 normal, S2 normal, normal heart sounds, intact distal pulses and normal pulses.  Exam reveals no gallop and no friction rub.   No murmur heard. Pulmonary/Chest: Effort normal and breath sounds normal. No tachypnea. No respiratory distress. She has no decreased breath sounds. She has no wheezes. She has no rhonchi. She has no rales.  Abdominal: Soft. Normal appearance and bowel sounds are normal. There is no hepatosplenomegaly. There is tenderness in the right lower quadrant, suprapubic area and left lower quadrant. There is no rigidity, no guarding and no CVA tenderness. No hernia.  Neurological: She is alert.  Skin: Skin is warm, dry and intact. No rash noted.  Psychiatric: Her speech is normal and behavior is normal. Judgment and thought content normal. Her mood appears not anxious. Cognition and memory are normal. She does not exhibit a depressed mood.          Assessment & Plan:

## 2016-06-07 NOTE — Assessment & Plan Note (Signed)
Send for culture. UA consistent with UTI.. Treat with macrobid x 7 days.  Avoid bladder irritants, push water.

## 2016-06-07 NOTE — Patient Instructions (Addendum)
Increase water.  Decrease tomato, orange juice and caffeine like tea, Decrease spicy foods. Complete course of antibiotics.  We will call with culture results.

## 2016-06-09 LAB — URINE CULTURE

## 2016-06-26 ENCOUNTER — Ambulatory Visit (INDEPENDENT_AMBULATORY_CARE_PROVIDER_SITE_OTHER): Payer: Managed Care, Other (non HMO) | Admitting: Gynecology

## 2016-06-26 ENCOUNTER — Encounter: Payer: Self-pay | Admitting: Gynecology

## 2016-06-26 VITALS — BP 120/76 | Ht 65.0 in | Wt 180.0 lb

## 2016-06-26 DIAGNOSIS — M8580 Other specified disorders of bone density and structure, unspecified site: Secondary | ICD-10-CM | POA: Diagnosis not present

## 2016-06-26 DIAGNOSIS — Z01419 Encounter for gynecological examination (general) (routine) without abnormal findings: Secondary | ICD-10-CM | POA: Diagnosis not present

## 2016-06-26 DIAGNOSIS — N952 Postmenopausal atrophic vaginitis: Secondary | ICD-10-CM

## 2016-06-26 NOTE — Patient Instructions (Addendum)
Follow up for bone density as scheduled  You may obtain a copy of any labs that were done today by logging onto MyChart as outlined in the instructions provided with your AVS (after visit summary). The office will not call with normal lab results but certainly if there are any significant abnormalities then we will contact you.   Health Maintenance Adopting a healthy lifestyle and getting preventive care can go a long way to promote health and wellness. Talk with your health care provider about what schedule of regular examinations is right for you. This is a good chance for you to check in with your provider about disease prevention and staying healthy. In between checkups, there are plenty of things you can do on your own. Experts have done a lot of research about which lifestyle changes and preventive measures are most likely to keep you healthy. Ask your health care provider for more information. WEIGHT AND DIET  Eat a healthy diet  Be sure to include plenty of vegetables, fruits, low-fat dairy products, and lean protein.  Do not eat a lot of foods high in solid fats, added sugars, or salt.  Get regular exercise. This is one of the most important things you can do for your health.  Most adults should exercise for at least 150 minutes each week. The exercise should increase your heart rate and make you sweat (moderate-intensity exercise).  Most adults should also do strengthening exercises at least twice a week. This is in addition to the moderate-intensity exercise.  Maintain a healthy weight  Body mass index (BMI) is a measurement that can be used to identify possible weight problems. It estimates body fat based on height and weight. Your health care provider can help determine your BMI and help you achieve or maintain a healthy weight.  For females 17 years of age and older:   A BMI below 18.5 is considered underweight.  A BMI of 18.5 to 24.9 is normal.  A BMI of 25 to 29.9 is  considered overweight.  A BMI of 30 and above is considered obese.  Watch levels of cholesterol and blood lipids  You should start having your blood tested for lipids and cholesterol at 64 years of age, then have this test every 5 years.  You may need to have your cholesterol levels checked more often if:  Your lipid or cholesterol levels are high.  You are older than 64 years of age.  You are at high risk for heart disease.  CANCER SCREENING   Lung Cancer  Lung cancer screening is recommended for adults 74-19 years old who are at high risk for lung cancer because of a history of smoking.  A yearly low-dose CT scan of the lungs is recommended for people who:  Currently smoke.  Have quit within the past 15 years.  Have at least a 30-pack-year history of smoking. A pack year is smoking an average of one pack of cigarettes a day for 1 year.  Yearly screening should continue until it has been 15 years since you quit.  Yearly screening should stop if you develop a health problem that would prevent you from having lung cancer treatment.  Breast Cancer  Practice breast self-awareness. This means understanding how your breasts normally appear and feel.  It also means doing regular breast self-exams. Let your health care provider know about any changes, no matter how small.  If you are in your 20s or 30s, you should have a clinical breast exam (  CBE) by a health care provider every 1-3 years as part of a regular health exam.  If you are 56 or older, have a CBE every year. Also consider having a breast X-ray (mammogram) every year.  If you have a family history of breast cancer, talk to your health care provider about genetic screening.  If you are at high risk for breast cancer, talk to your health care provider about having an MRI and a mammogram every year.  Breast cancer gene (BRCA) assessment is recommended for women who have family members with BRCA-related cancers.  BRCA-related cancers include:  Breast.  Ovarian.  Tubal.  Peritoneal cancers.  Results of the assessment will determine the need for genetic counseling and BRCA1 and BRCA2 testing. Cervical Cancer Routine pelvic examinations to screen for cervical cancer are no longer recommended for nonpregnant women who are considered low risk for cancer of the pelvic organs (ovaries, uterus, and vagina) and who do not have symptoms. A pelvic examination may be necessary if you have symptoms including those associated with pelvic infections. Ask your health care provider if a screening pelvic exam is right for you.   The Pap test is the screening test for cervical cancer for women who are considered at risk.  If you had a hysterectomy for a problem that was not cancer or a condition that could lead to cancer, then you no longer need Pap tests.  If you are older than 65 years, and you have had normal Pap tests for the past 10 years, you no longer need to have Pap tests.  If you have had past treatment for cervical cancer or a condition that could lead to cancer, you need Pap tests and screening for cancer for at least 20 years after your treatment.  If you no longer get a Pap test, assess your risk factors if they change (such as having a new sexual partner). This can affect whether you should start being screened again.  Some women have medical problems that increase their chance of getting cervical cancer. If this is the case for you, your health care provider may recommend more frequent screening and Pap tests.  The human papillomavirus (HPV) test is another test that may be used for cervical cancer screening. The HPV test looks for the virus that can cause cell changes in the cervix. The cells collected during the Pap test can be tested for HPV.  The HPV test can be used to screen women 39 years of age and older. Getting tested for HPV can extend the interval between normal Pap tests from three to  five years.  An HPV test also should be used to screen women of any age who have unclear Pap test results.  After 65 years of age, women should have HPV testing as often as Pap tests.  Colorectal Cancer  This type of cancer can be detected and often prevented.  Routine colorectal cancer screening usually begins at 64 years of age and continues through 64 years of age.  Your health care provider may recommend screening at an earlier age if you have risk factors for colon cancer.  Your health care provider may also recommend using home test kits to check for hidden blood in the stool.  A small camera at the end of a tube can be used to examine your colon directly (sigmoidoscopy or colonoscopy). This is done to check for the earliest forms of colorectal cancer.  Routine screening usually begins at age 77.  Direct examination of the colon should be repeated every 5-10 years through 64 years of age. However, you may need to be screened more often if early forms of precancerous polyps or small growths are found. Skin Cancer  Check your skin from head to toe regularly.  Tell your health care provider about any new moles or changes in moles, especially if there is a change in a mole's shape or color.  Also tell your health care provider if you have a mole that is larger than the size of a pencil eraser.  Always use sunscreen. Apply sunscreen liberally and repeatedly throughout the day.  Protect yourself by wearing long sleeves, pants, a wide-brimmed hat, and sunglasses whenever you are outside. HEART DISEASE, DIABETES, AND HIGH BLOOD PRESSURE   Have your blood pressure checked at least every 1-2 years. High blood pressure causes heart disease and increases the risk of stroke.  If you are between 76 years and 55 years old, ask your health care provider if you should take aspirin to prevent strokes.  Have regular diabetes screenings. This involves taking a blood sample to check your  fasting blood sugar level.  If you are at a normal weight and have a low risk for diabetes, have this test once every three years after 64 years of age.  If you are overweight and have a high risk for diabetes, consider being tested at a younger age or more often. PREVENTING INFECTION  Hepatitis B  If you have a higher risk for hepatitis B, you should be screened for this virus. You are considered at high risk for hepatitis B if:  You were born in a country where hepatitis B is common. Ask your health care provider which countries are considered high risk.  Your parents were born in a high-risk country, and you have not been immunized against hepatitis B (hepatitis B vaccine).  You have HIV or AIDS.  You use needles to inject street drugs.  You live with someone who has hepatitis B.  You have had sex with someone who has hepatitis B.  You get hemodialysis treatment.  You take certain medicines for conditions, including cancer, organ transplantation, and autoimmune conditions. Hepatitis C  Blood testing is recommended for:  Everyone born from [birth year] through 1964.  Anyone with known risk factors for hepatitis C. Sexually transmitted infections (STIs)  You should be screened for sexually transmitted infections (STIs) including gonorrhea and chlamydia if:  You are sexually active and are younger than 64 years of age.  You are older than 64 years of age and your health care provider tells you that you are at risk for this type of infection.  Your sexual activity has changed since you were last screened and you are at an increased risk for chlamydia or gonorrhea. Ask your health care provider if you are at risk.  If you do not have HIV, but are at risk, it may be recommended that you take a prescription medicine daily to prevent HIV infection. This is called pre-exposure prophylaxis (PrEP). You are considered at risk if:  You are sexually active and do not regularly use condoms or  know the HIV status of your partner(s).  You take drugs by injection.  You are sexually active with a partner who has HIV. Talk with your health care provider about whether you are at high risk of being infected with HIV. If you choose to begin PrEP, you should first be tested for HIV. You should then be  tested every 3 months for as long as you are taking PrEP.  PREGNANCY   If you are premenopausal and you may become pregnant, ask your health care provider about preconception counseling.  If you may become pregnant, take 400 to 800 micrograms (mcg) of folic acid every day.  If you want to prevent pregnancy, talk to your health care provider about birth control (contraception). OSTEOPOROSIS AND MENOPAUSE   Osteoporosis is a disease in which the bones lose minerals and strength with aging. This can result in serious bone fractures. Your risk for osteoporosis can be identified using a bone density scan.  If you are 25 years of age or older, or if you are at risk for osteoporosis and fractures, ask your health care provider if you should be screened.  Ask your health care provider whether you should take a calcium or vitamin D supplement to lower your risk for osteoporosis.  Menopause may have certain physical symptoms and risks.  Hormone replacement therapy may reduce some of these symptoms and risks. Talk to your health care provider about whether hormone replacement therapy is right for you.  HOME CARE INSTRUCTIONS   Schedule regular health, dental, and eye exams.  Stay current with your immunizations.   Do not use any tobacco products including cigarettes, chewing tobacco, or electronic cigarettes.  If you are pregnant, do not drink alcohol.  If you are breastfeeding, limit how much and how often you drink alcohol.  Limit alcohol intake to no more than 1 drink per day for nonpregnant women. One drink equals 12 ounces of beer, 5 ounces of wine, or 1 ounces of hard liquor.  Do  not use street drugs.  Do not share needles.  Ask your health care provider for help if you need support or information about quitting drugs.  Tell your health care provider if you often feel depressed.  Tell your health care provider if you have ever been abused or do not feel safe at home. Document Released: 04/08/2011 Document Revised: 02/07/2014 Document Reviewed: 08/25/2013 [facility] Patient Information 2015 [place], [place]. This information is not intended to replace advice given to you by your health care provider. Make sure you discuss any questions you have with your health care provider.

## 2016-06-26 NOTE — Progress Notes (Signed)
    [name] [name] Oct 04, 1951 [id number]        65 y.o.  G3P3  for annual exam.  Overall doing well.  Past medical history,surgical history, problem list, medications, allergies, family history and social history were all reviewed and documented as reviewed in the EPIC chart.  ROS:  Performed with pertinent positives and negatives included in the history, assessment and plan.   Additional significant findings :  None   Exam: Caryn Bee assistant Vitals:   06/26/16 1358  BP: 120/76  Weight: 180 lb (81.6 kg)  Height: 5\' 5"  (1.651 m)   Body mass index is 29.95 kg/m.  General appearance:  Normal affect, orientation and appearance. Skin: Grossly normal HEENT: Without gross lesions.  No cervical or supraclavicular adenopathy. Thyroid normal.  Lungs:  Clear without wheezing, rales or rhonchi Cardiac: RR, without RMG Abdominal:  Soft, nontender, without masses, guarding, rebound, organomegaly or hernia Breasts:  Examined lying and sitting without masses, retractions, discharge or axillary adenopathy. Pelvic:  Ext, BUS, Vagina with atrophic changes  Adnexa without masses or tenderness    Anus and perineum normal   Rectovaginal normal sphincter tone without palpated masses or tenderness.    Assessment/Plan:  65 y.o. G3P3 female for annual exam.   1. Postmenopausal/atrophic genital changes. Status post TVH for complex hyperplasia and history of maternal uterine cancer. Doing well without significant hot flushes, night sweats, vaginal dryness. 2. Osteopenia. DEXA 2015 T score -1.6 FRAX 16%/0.8%. Recommended repeat DEXA now and patient will schedule. 3. Colonoscopy 2016. Repeat at their recommended interval. 4. Mammography scheduled and the patient will follow up for it. SBE monthly reviewed. 5. Pap smear 2015. No Pap smear done today. No history of abnormal Pap smears. Options to stop screening altogether per current screening guidelines based on hysterectomy history reviewed. Will  readdress on annual basis. 6. Health maintenance no routine lab work done as patient does this elsewhere. Follow up for bone density otherwise 1 year, sooner as needed.   Anastasio Auerbach MD, 2:23 PM 06/26/2016

## 2016-07-03 ENCOUNTER — Ambulatory Visit (INDEPENDENT_AMBULATORY_CARE_PROVIDER_SITE_OTHER): Payer: Managed Care, Other (non HMO)

## 2016-07-03 ENCOUNTER — Ambulatory Visit (INDEPENDENT_AMBULATORY_CARE_PROVIDER_SITE_OTHER): Payer: Managed Care, Other (non HMO) | Admitting: Podiatry

## 2016-07-03 ENCOUNTER — Encounter: Payer: Self-pay | Admitting: Podiatry

## 2016-07-03 VITALS — BP 110/70 | HR 76 | Resp 16 | Ht 65.0 in | Wt 178.0 lb

## 2016-07-03 DIAGNOSIS — M79672 Pain in left foot: Secondary | ICD-10-CM

## 2016-07-03 DIAGNOSIS — M722 Plantar fascial fibromatosis: Secondary | ICD-10-CM | POA: Diagnosis not present

## 2016-07-03 DIAGNOSIS — M2042 Other hammer toe(s) (acquired), left foot: Secondary | ICD-10-CM | POA: Diagnosis not present

## 2016-07-03 NOTE — Progress Notes (Signed)
   Subjective:    Patient ID: [name] [name], female    DOB: 07-15-1951, 65 y.o.   MRN: [id number]  HPI  Chief Complaint  Patient presents with  . Foot Problem    L plantar lump x 2 wks.  Pt states "it's a little sore."        Review of Systems  All other systems reviewed and are negative.      Objective:   Physical Exam        Assessment & Plan:

## 2016-07-04 NOTE — Progress Notes (Signed)
Subjective:     Patient ID: [name] [name], female   DOB: 08-02-1951, 65 y.o.   MRN: [id number]  HPI patient presents stating that I feel like I got a not on the bottom my left foot that's mildly tender but I'm worried it we'll grow in size   Review of Systems  All other systems reviewed and are negative.      Objective:   Physical Exam  Constitutional: She is oriented to person, place, and time.  Cardiovascular: Intact distal pulses.   Musculoskeletal: Normal range of motion.  Neurological: She is oriented to person, place, and time.  Skin: Skin is warm.  Nursing note and vitals reviewed.  neurovascular status intact muscle strength adequate with small 5 x 5 mm nodule in the plantar fascial left that may have assisting base to it as it appears slightly soft. 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

## 2016-07-05 ENCOUNTER — Ambulatory Visit: Payer: Managed Care, Other (non HMO)

## 2016-07-08 LAB — HM PAP SMEAR

## 2016-07-11 ENCOUNTER — Ambulatory Visit
Admission: RE | Admit: 2016-07-11 | Discharge: 2016-07-11 | Disposition: A | Discharge status: Home / Self Care | Payer: Managed Care, Other (non HMO) | Source: Ambulatory Visit | Attending: Gynecology | Admitting: Gynecology

## 2016-07-11 DIAGNOSIS — Z1231 Encounter for screening mammogram for malignant neoplasm of breast: Secondary | ICD-10-CM

## 2016-07-22 ENCOUNTER — Other Ambulatory Visit: Payer: Self-pay | Admitting: Family Medicine

## 2016-07-22 NOTE — Telephone Encounter (Signed)
It says Rxs haven't been prescribed in over a year, please advise

## 2016-07-22 NOTE — Telephone Encounter (Signed)
Please refill both times 3 Thanks

## 2016-07-22 NOTE — Telephone Encounter (Signed)
done

## 2016-07-30 ENCOUNTER — Other Ambulatory Visit: Payer: Self-pay | Admitting: Gynecology

## 2016-07-30 ENCOUNTER — Ambulatory Visit (INDEPENDENT_AMBULATORY_CARE_PROVIDER_SITE_OTHER): Payer: Managed Care, Other (non HMO)

## 2016-07-30 DIAGNOSIS — M8580 Other specified disorders of bone density and structure, unspecified site: Secondary | ICD-10-CM | POA: Diagnosis not present

## 2016-07-30 DIAGNOSIS — Z13820 Encounter for screening for osteoporosis: Secondary | ICD-10-CM | POA: Diagnosis not present

## 2016-09-30 ENCOUNTER — Telehealth: Payer: Self-pay | Admitting: Family Medicine

## 2016-09-30 DIAGNOSIS — Z0000 Encounter for general adult medical examination without abnormal findings: Secondary | ICD-10-CM

## 2016-09-30 DIAGNOSIS — E559 Vitamin D deficiency, unspecified: Secondary | ICD-10-CM

## 2016-09-30 NOTE — Telephone Encounter (Signed)
-----   Message from Ellamae Sia sent at 09/27/2016 11:06 AM EST ----- Regarding: Lab orders for Wednesday, 12.27.17 Patient is scheduled for CPX labs, please order future labs, Thanks , Karna Christmas

## 2016-10-02 ENCOUNTER — Other Ambulatory Visit: Payer: Managed Care, Other (non HMO)

## 2016-10-03 ENCOUNTER — Other Ambulatory Visit: Payer: Managed Care, Other (non HMO)

## 2016-10-04 ENCOUNTER — Other Ambulatory Visit: Payer: Managed Care, Other (non HMO)

## 2016-10-11 ENCOUNTER — Encounter: Payer: Managed Care, Other (non HMO) | Admitting: Family Medicine

## 2016-11-15 ENCOUNTER — Ambulatory Visit (INDEPENDENT_AMBULATORY_CARE_PROVIDER_SITE_OTHER): Payer: Managed Care, Other (non HMO) | Admitting: Family Medicine

## 2016-11-15 ENCOUNTER — Encounter: Payer: Self-pay | Admitting: Family Medicine

## 2016-11-15 VITALS — BP 118/64 | HR 77 | Temp 98.2°F | Ht 65.0 in | Wt 179.8 lb

## 2016-11-15 DIAGNOSIS — R300 Dysuria: Secondary | ICD-10-CM | POA: Diagnosis not present

## 2016-11-15 DIAGNOSIS — N3000 Acute cystitis without hematuria: Secondary | ICD-10-CM | POA: Insufficient documentation

## 2016-11-15 LAB — POC URINALSYSI DIPSTICK (AUTOMATED)
BILIRUBIN UA: NEGATIVE
Glucose, UA: NEGATIVE
KETONES UA: NEGATIVE
Nitrite, UA: NEGATIVE
PH UA: 6
Protein, UA: NEGATIVE
Urobilinogen, UA: 0.2

## 2016-11-15 MED ORDER — NITROFURANTOIN MONOHYD MACRO 100 MG PO CAPS: 100.0000 mg | ORAL_CAPSULE | Freq: Two times a day (BID) | ORAL | 0 refills | Status: DC

## 2016-11-15 NOTE — Patient Instructions (Addendum)
Goal is to get 64 oz of fluid per day  Mostly water  This helps prevent utis  Take the macrobid as directed  We will alert you when the urine culture returns     Urinary Tract Infection, Adult Introduction A urinary tract infection (UTI) is an infection of any part of the urinary tract. The urinary tract includes the:  Kidneys.  Ureters.  Bladder.  Urethra. These organs make, store, and get rid of pee (urine) in the body. Follow these instructions at home:  Take over-the-counter and prescription medicines only as told by your doctor.  If you were prescribed an antibiotic medicine, take it as told by your doctor. Do not stop taking the antibiotic even if you start to feel better.  Avoid the following drinks:  Alcohol.  Caffeine.  Tea.  Carbonated drinks.  Drink enough fluid to keep your pee clear or pale yellow.  Keep all follow-up visits as told by your doctor. This is important.  Make sure to:  Empty your bladder often and completely. Do not to hold pee for long periods of time.  Empty your bladder before and after sex.  Wipe from front to back after a bowel movement if you are female. Use each tissue one time when you wipe. Contact a doctor if:  You have back pain.  You have a fever.  You feel sick to your stomach (nauseous).  You throw up (vomit).  Your symptoms do not get better after 3 days.  Your symptoms go away and then come back. Get help right away if:  You have very bad back pain.  You have very bad lower belly (abdominal) pain.  You are throwing up and cannot keep down any medicines or water. This information is not intended to replace advice given to you by your health care provider. Make sure you discuss any questions you have with your health care provider. Document Released: 03/11/2008 Document Revised: 02/29/2016 Document Reviewed: 08/14/2015  2017 [employer]

## 2016-11-15 NOTE — Progress Notes (Signed)
Pre visit review using our clinic review tool, if applicable. No additional management support is needed unless otherwise documented below in the visit note. 

## 2016-11-15 NOTE — Progress Notes (Signed)
Subjective:    Patient ID: [name] [name], female    DOB: [birth year], 66 y.o.   MRN: [id number]  HPI Here for urinary symptoms coming and going for a week She tried drinking a lot of water  Some dysuria  Getting a bit worse  Milky looking urine  No odor  Frequency - and then she may not have much volume  No incontinence   A little nauseated-no vomiting  Feeling kind of yucky  No flank pain   Last uti was klebsiella   Results for orders placed or performed in visit on 11/15/16  POCT Urinalysis Dipstick (Automated)  Result Value Ref Range   Color, UA Yellow    Clarity, UA Hazy    Glucose, UA Negative    Bilirubin, UA Negative    Ketones, UA Negative    Spec Grav, UA >=1.030    Blood, UA 25 Ery/uL    pH, UA 6.0    Protein, UA Negative    Urobilinogen, UA 0.2    Nitrite, UA Negative    Leukocytes, UA small (1+) (A) Negative    Struggles with water intake  Drinks tea   Patient Active Problem List   Diagnosis Date Noted  . Acute cystitis 11/15/2016  . Palpitations 05/22/2015  . Pain in the chest   . Sore throat 01/16/2015  . OA (osteoarthritis) of knee 11/21/2014  . Colon cancer screening 11/29/2013  . Routine general medical examination at a health care facility 09/16/2011  . DERMATITIS, ATOPIC 11/27/2010  . URTICARIA DUE TO COLD OR HEAT 11/27/2010  . STRESS REACTION, ACUTE, WITH EMOTIONAL DISTURBANCE 09/14/2010  . THROAT PAIN, CHRONIC 09/14/2010  . EXTERNAL HEMORRHOIDS WITHOUT MENTION COMP 06/25/2010  . ABNORMAL EKG 12/26/2009  . OTHER CHRONIC SINUSITIS 10/19/2009  . Vitamin D deficiency 04/21/2009  . ALLERGIC RHINITIS 11/09/2008  . GERD 07/28/2008  . Osteopenia 03/14/2008  . HYPERCHOLESTEROLEMIA, PURE 03/13/2007  . HIATAL HERNIA 01/12/2007   Past Medical History:  Diagnosis Date  . Acute meniscal tear of knee   . Arthritis    "neck; left knee before replacement" (05/10/2015)  . Basal cell cancer 5/08  . Breast cyst    "left"  . Chest pain 9/11   [facility]) and palpitations - ruled out for MI with nl. Echo  . Complication of anesthesia HARD TO WAKE  . Difficulty sleeping   . Eczema   . GERD (gastroesophageal reflux disease)   . Heart palpitations OCCASIONAL--  TAKES ATENOLOL PRN  . Hiatal hernia   . HSV-2 infection    buttocks  . Hyperlipidemia DIET CONTROL  . Melanoma of skin ([place])   . MVP (mitral valve prolapse)    per pt no treatment needed  . Normal nuclear stress test 07-09-10  . Osteopenia 12/2013   T score -1.6 FRAX 16%/0.8%  . PONV (postoperative nausea and vomiting)    Past Surgical History:  Procedure Laterality Date  . Carotid US  9/05   Mild, recheck 1 year  . Carotid US  10/06   Normal  . CHONDROPLASTY  10/23/2011   Procedure: CHONDROPLASTY;  Surgeon: Gearlean Alf, MD;  Location: [facility];  Service: Orthopedics;  Laterality: Left;  Left medial   . COLONOSCOPY    . ESOPHAGOGASTRODUODENOSCOPY  9/07   Normal  . HYSTEROSCOPY WITH RESECTOSCOPE  2000   POLPECTOMY'S  . JOINT REPLACEMENT    . KNEE ARTHROSCOPY  10/23/2011   Procedure: ARTHROSCOPY KNEE;  Surgeon: Gearlean Alf, MD;  Location:  [facility];  Service: Orthopedics;  Laterality: Left;  LEFT KNEE SCOPE WITH DEBRIDEMENT   . MELANOMA EXCISION Right 1995   "side"  . MOHS SURGERY Left    "side of my nose"  . MOLE REMOVAL     "several; all over my body"  . MRI neck  3/00   C4-C5 herniation small stenosis mild C4-5, C5-6  . TONSILLECTOMY    . TOTAL KNEE ARTHROPLASTY Left 11/21/2014   Procedure: LEFT TOTAL KNEE ARTHROPLASTY;  Surgeon: Gearlean Alf, MD;  Location: [facility];  Service: Orthopedics;  Laterality: Left;  . TRANSTHORACIC ECHOCARDIOGRAM  06-29-2010   NORMAL LVSF, EF 55-60%,  MILD MR  . TUBAL LIGATION  1989  . VAGINAL HYSTERECTOMY  2001   complex hyperplasia with mother's history of uterine cancer   Social History  Substance Use Topics  . Smoking status: Former Smoker    Packs/day: 1.00    Years: 15.00     Types: Cigarettes    Quit date: 10/07/1978  . Smokeless tobacco: Never Used  . Alcohol use No   Family History  Problem Relation Age of Onset  . Cancer Mother     uterine  . Obesity Mother   . Thrombocytopenia Mother   . Diabetes Mother   . Alcohol abuse Father   . Heart disease Father     CAD in 2's  . Emphysema Father   . Diabetes Maternal Grandmother     type 2  . Heart disease Paternal Aunt     CAD  . Heart disease Paternal Uncle     CAD  . Colon cancer Neg Hx    Allergies  Allergen Reactions  . Amoxicillin Rash  . Codeine Rash  . Clarithromycin Other (See Comments)    REACTION: reaction not known   Current Outpatient Prescriptions on File Prior to Visit  Medication Sig Dispense Refill  . acyclovir (ZOVIRAX) 200 MG capsule TAKE 1 CAPSULE BY MOUTH DAILY AS NEEDED 60 capsule 3  . acyclovir cream (ZOVIRAX) 5 % Apply 1 application topically daily as needed. 5 g 3  . Ascorbic Acid (VITAMIN C PO) Take 1 tablet by mouth daily.    Marland Kitchen aspirin EC 81 MG tablet Take 1 tablet (81 mg total) by mouth daily.    Marland Kitchen atenolol (TENORMIN) 25 MG tablet Take 0.5 tablets (12.5 mg total) by mouth daily. 30 tablet 11  . CALCIUM CARBONATE PO Take 1 tablet by mouth daily.    . Cholecalciferol (VITAMIN D PO) Take 1 tablet by mouth daily.    Marland Kitchen CINNAMON PO Take 1 capsule by mouth. Takes occasionally    . CYANOCOBALAMIN PO Take 1 tablet by mouth daily.    Marland Kitchen KRILL OIL PO Take by mouth. Takes 1 tab occasionally    . magnesium gluconate (MAGONATE) 500 MG tablet Take 500 mg by mouth daily.    . Multiple Vitamin (MULTIVITAMIN) capsule Take 1 capsule by mouth daily.    Vladimir Faster Glycol-Propyl Glycol (SYSTANE OP) Apply 1 drop to eye daily as needed (Dry eyes).     No current facility-administered medications on file prior to visit.     Review of Systems  Constitutional: Positive for fatigue. Negative for activity change, appetite change and fever.  HENT: Negative for congestion and sore throat.     Eyes: Negative for itching and visual disturbance.  Respiratory: Negative for cough and shortness of breath.   Cardiovascular: Negative for leg swelling.  Gastrointestinal: Negative for abdominal distention, abdominal pain,  constipation, diarrhea and nausea.  Endocrine: Negative for cold intolerance and polydipsia.  Genitourinary: Positive for dysuria, frequency and urgency. Negative for difficulty urinating, flank pain and hematuria.  Musculoskeletal: Negative for myalgias.  Skin: Negative for rash.  Allergic/Immunologic: Negative for immunocompromised state.  Neurological: Negative for dizziness and weakness.  Hematological: Negative for adenopathy.       Objective:   Physical Exam  Constitutional: She appears well-developed and well-nourished. No distress.  HENT:  Head: Normocephalic and atraumatic.  Eyes: Conjunctivae and EOM are normal. Pupils are equal, round, and reactive to light.  Neck: Normal range of motion. Neck supple.  Cardiovascular: Normal rate, regular rhythm and normal heart sounds.   Pulmonary/Chest: Effort normal and breath sounds normal.  Abdominal: Soft. Bowel sounds are normal. She exhibits no distension. There is tenderness. There is no rebound.  No cva tenderness  Mild suprapubic tenderness  Musculoskeletal: She exhibits no edema.  Lymphadenopathy:    She has no cervical adenopathy.  Neurological: She is alert.  Skin: No rash noted.  Psychiatric: She has a normal mood and affect.          Assessment & Plan:   Problem List Items Addressed This Visit      Genitourinary   Acute cystitis - Primary    Pt has had klebsiella in the past with some drug resistance  Will cover with macrobid (take with food and update if this causes nausea) Inc water intake (goal 64 oz daily- fluids in total)  Update if worse no no imp in 2-3 d  Pending culture result Handout given      Relevant Orders   Urine culture (Completed)    Other Visit Diagnoses     Dysuria       Relevant Orders   POCT Urinalysis Dipstick (Automated) (Completed)   Urine culture (Completed)

## 2016-11-17 LAB — URINE CULTURE

## 2016-11-17 NOTE — Assessment & Plan Note (Signed)
Pt has had klebsiella in the past with some drug resistance  Will cover with macrobid (take with food and update if this causes nausea) Inc water intake (goal 64 oz daily- fluids in total)  Update if worse no no imp in 2-3 d  Pending culture result Handout given

## 2016-11-20 ENCOUNTER — Ambulatory Visit (INDEPENDENT_AMBULATORY_CARE_PROVIDER_SITE_OTHER): Payer: Managed Care, Other (non HMO) | Admitting: Family Medicine

## 2016-11-20 ENCOUNTER — Encounter: Payer: Self-pay | Admitting: Family Medicine

## 2016-11-20 VITALS — BP 118/70 | HR 99 | Temp 98.3°F | Ht 65.0 in | Wt 178.2 lb

## 2016-11-20 DIAGNOSIS — R5383 Other fatigue: Secondary | ICD-10-CM

## 2016-11-20 DIAGNOSIS — R1013 Epigastric pain: Secondary | ICD-10-CM

## 2016-11-20 DIAGNOSIS — R5381 Other malaise: Secondary | ICD-10-CM

## 2016-11-20 DIAGNOSIS — N3001 Acute cystitis with hematuria: Secondary | ICD-10-CM | POA: Diagnosis not present

## 2016-11-20 LAB — COMPREHENSIVE METABOLIC PANEL
ALT: 29 U/L (ref 0–35)
AST: 24 U/L (ref 0–37)
Albumin: 4.3 g/dL (ref 3.5–5.2)
Alkaline Phosphatase: 78 U/L (ref 39–117)
BUN: 17 mg/dL (ref 6–23)
CO2: 31 mEq/L (ref 19–32)
Calcium: 10 mg/dL (ref 8.4–10.5)
Chloride: 105 mEq/L (ref 96–112)
Creatinine, Ser: 0.79 mg/dL (ref 0.40–1.20)
GFR: 77.72 mL/min (ref >60.00)
Glucose, Bld: 119 mg/dL — ABNORMAL HIGH (ref 70–99)
Potassium: 4.1 mEq/L (ref 3.5–5.1)
Sodium: 140 mEq/L (ref 135–145)
Total Bilirubin: 0.5 mg/dL (ref 0.2–1.2)
Total Protein: 7 g/dL (ref 6.0–8.3)

## 2016-11-20 LAB — URINALYSIS, ROUTINE W REFLEX MICROSCOPIC
Bilirubin Urine: NEGATIVE
Ketones, ur: NEGATIVE
Nitrite: NEGATIVE
RBC / HPF: NONE SEEN (ref 0–?)
Specific Gravity, Urine: <=1.005 — AB (ref 1.000–1.030)
Total Protein, Urine: NEGATIVE
Urine Glucose: NEGATIVE
Urobilinogen, UA: 0.2 (ref 0.0–1.0)
pH: 7 (ref 5.0–8.0)

## 2016-11-20 LAB — CBC
HCT: 45.4 % (ref 36.0–46.0)
Hemoglobin: 15.5 g/dL — ABNORMAL HIGH (ref 12.0–15.0)
MCHC: 34.2 g/dL (ref 30.0–36.0)
MCV: 94.7 fl (ref 78.0–100.0)
Platelets: 199 10*3/uL (ref 150.0–400.0)
RBC: 4.79 Mil/uL (ref 3.87–5.11)
RDW: 13.2 % (ref 11.5–15.5)
WBC: 5.7 10*3/uL (ref 4.0–10.5)

## 2016-11-20 LAB — POCT INFLUENZA A/B
Influenza A, POC: NEGATIVE
Influenza B, POC: NEGATIVE

## 2016-11-20 MED ORDER — SUCRALFATE 1 G PO TABS: 1.0000 g | ORAL_TABLET | Freq: Three times a day (TID) | ORAL | 0 refills | Status: DC

## 2016-11-20 MED ORDER — GI COCKTAIL ~~LOC~~: 30.0000 mL | Freq: Once | ORAL | Status: DC

## 2016-11-20 MED ORDER — CIPROFLOXACIN HCL 250 MG PO TABS: 250.0000 mg | ORAL_TABLET | Freq: Two times a day (BID) | ORAL | 0 refills | Status: DC

## 2016-11-20 NOTE — Progress Notes (Signed)
[name] [name] is a 66 y.o. female here for a new problem.  History of Present Illness:   "I feel awful." Diagnosed with UTI several days ago. Confirmed via UCx as Klebsiella, sensitive to the prescribed Macrobid. Urinary symptoms have improved. Now, feeling fatigued, chilled, with epigastric pain, some nausea. Stopped the Macrobid. Three days until she finishes the Rx. She has been drinking fluids, not eating much. No dizziness, vision changes, Cp, SOB, bowel changes, edema. She did not get the flu shot this year.    PMHx, SurgHx, SocialHx, Medications, and Allergies were reviewed in the Visit Navigator and updated as appropriate.  Current Medications:   Current Outpatient Prescriptions:  .  acyclovir (ZOVIRAX) 200 MG capsule, TAKE 1 CAPSULE BY MOUTH DAILY AS NEEDED, Disp: 60 capsule, Rfl: 3 .  acyclovir cream (ZOVIRAX) 5 %, Apply 1 application topically daily as needed., Disp: 5 g, Rfl: 3 .  Ascorbic Acid (VITAMIN C PO), Take 1 tablet by mouth daily., Disp: , Rfl:  .  aspirin EC 81 MG tablet, Take 1 tablet (81 mg total) by mouth daily., Disp: , Rfl:  .  atenolol (TENORMIN) 25 MG tablet, Take 0.5 tablets (12.5 mg total) by mouth daily., Disp: 30 tablet, Rfl: 11 .  CALCIUM CARBONATE PO, Take 1 tablet by mouth daily., Disp: , Rfl:  .  Cholecalciferol (VITAMIN D PO), Take 1 tablet by mouth daily., Disp: , Rfl:  .  CINNAMON PO, Take 1 capsule by mouth. Takes occasionally, Disp: , Rfl:  .  CYANOCOBALAMIN PO, Take 1 tablet by mouth daily., Disp: , Rfl:  .  KRILL OIL PO, Take by mouth. Takes 1 tab occasionally, Disp: , Rfl:  .  magnesium gluconate (MAGONATE) 500 MG tablet, Take 500 mg by mouth daily., Disp: , Rfl:  .  Multiple Vitamin (MULTIVITAMIN) capsule, Take 1 capsule by mouth daily., Disp: , Rfl:  .  nitrofurantoin, macrocrystal-monohydrate, (MACROBID) 100 MG capsule, Take 1 capsule (100 mg total) by mouth 2 (two) times daily. Take with food, Disp: 14 capsule, Rfl: 0 .  Polyethyl  Glycol-Propyl Glycol (SYSTANE OP), Apply 1 drop to eye daily as needed (Dry eyes)., Disp: , Rfl:    Review of Systems:   Constitutional: Negative for fever, chills and unexpected weight change.  HEENT: Negative for ear discharge, ear pain, mouth sores, sore throat, tinnitus and trouble swallowing.   LUNGS: Negative for cough, choking, chest tightness, shortness of breath and wheezing.   CV: Negative for chest pain, palpitations and leg swelling.  GI: Negative for diarrhea, constipation and abdominal distention. No new change in bowel habits. GU: Negative for dysuria, flank pain and difficulty urinating.  MSK:: Negative for unusual  joint swelling or pains, gait problem, neck pain and neck stiffness.  NEURO: Negative for dizziness, tremors, speech difficulty, and headaches. No visual changes. HEME:: Does not bruise/bleed easily.  PSYCH:: Negative for suicidal ideas, hallucinations, sleep disturbance, self-injury, dysphoric mood, decreased concentration and agitation.      Vitals:   Vitals:   11/20/16 1352  BP: 118/70  Pulse: 99  Temp: 98.3 F (36.8 C)  TempSrc: Oral  SpO2: 97%  Weight: 178 lb 3.2 oz (80.8 kg)  Height: 5\' 5"  (1.651 m)     Body mass index is 29.65 kg/m.   Physical Exam:   General: Alert, cooperative, appears stated age and no distress.  HEENT:  Normocephalic, without obvious abnormality, atraumatic. Conjunctivae/corneas clear. PERRL, EOM's intact. Normal TM's and external ear canals both ears. Nares normal. Septum midline.  Mucosa normal. No drainage or sinus tenderness. Lips, mucosa, and tongue normal; teeth and gums normal.  Lungs: Clear to auscultation bilaterally.  Heart:: Regular rate and rhythm, S1, S2 normal, no murmur, click, rub or gallop.  Abdomen: Soft, non-tender; bowel sounds normal; no masses,  no organomegaly.  Extremities: Extremities normal, atraumatic, no cyanosis or edema.  Pulses: 2+ and symmetric.  Skin: Skin color, texture, turgor normal.  No rashes or lesions.  Neurologic: Alert and oriented X 3, normal strength and tone. Normal symmetric. reflexes. Normal coordination and gait.  Psych: Alert,oriented, in NAD with a full range of affect, normal behavior and no psychotic features    Results for orders placed or performed in visit on 11/20/16  CBC  Result Value Ref Range   WBC 5.7 4.0 - 10.5 K/uL   RBC 4.79 3.87 - 5.11 Mil/uL   Platelets 199.0 150.0 - 400.0 K/uL   Hemoglobin 15.5 (H) 12.0 - 15.0 g/dL   HCT 45.4 36.0 - 46.0 %   MCV 94.7 78.0 - 100.0 fl   MCHC 34.2 30.0 - 36.0 g/dL   RDW 13.2 11.5 - 15.5 %  Comprehensive metabolic panel  Result Value Ref Range   Sodium 140 135 - 145 mEq/L   Potassium 4.1 3.5 - 5.1 mEq/L   Chloride 105 96 - 112 mEq/L   CO2 31 19 - 32 mEq/L   Glucose, Bld 119 (H) 70 - 99 mg/dL   BUN 17 6 - 23 mg/dL   Creatinine, Ser 0.79 0.40 - 1.20 mg/dL   Total Bilirubin 0.5 0.2 - 1.2 mg/dL   Alkaline Phosphatase 78 39 - 117 U/L   AST 24 0 - 37 U/L   ALT 29 0 - 35 U/L   Total Protein 7.0 6.0 - 8.3 g/dL   Albumin 4.3 3.5 - 5.2 g/dL   Calcium 10.0 8.4 - 10.5 mg/dL   GFR 77.72 >60.00 mL/min  Urinalysis, Routine w reflex microscopic  Result Value Ref Range   Color, Urine YELLOW Yellow;Lt. Yellow   APPearance CLEAR Clear   Specific Gravity, Urine <=1.005 (A) 1.000 - 1.030   pH 7.0 5.0 - 8.0   Total Protein, Urine NEGATIVE Negative   Urine Glucose NEGATIVE Negative   Ketones, ur NEGATIVE Negative   Bilirubin Urine NEGATIVE Negative   Hgb urine dipstick TRACE-LYSED (A) Negative   Urobilinogen, UA 0.2 0.0 - 1.0   Leukocytes, UA TRACE (A) Negative   Nitrite NEGATIVE Negative   WBC, UA 0-2/hpf 0-2/hpf   RBC / HPF none seen 0-2/hpf   Squamous Epithelial / LPF Rare(0-4/hpf) Rare(0-4/hpf)  POC Influenza A/B  Result Value Ref Range   Influenza A, POC Negative Negative   Influenza B, POC Negative Negative    EKG: normal sinus rhythm.   Assessment and Plan:    [name] [name] was seen today for acute  visit.  Diagnoses and all orders for this visit:  Epigastric pain Comments: Most c/w gastritis. Stop Macrobid. Start Carafate. Red flags reviewed. Orders: -     EKG 12-Lead -     gi cocktail (Maalox,Lidocaine,Donnatal); Take 30 mLs by mouth once. - with some improvement in epigastric pain.  -     sucralfate (CARAFATE) 1 g tablet; Take 1 tablet (1 g total) by mouth 4 (four) times daily -  with meals and at bedtime.  Malaise and fatigue Comments: Labs reassuring.  Orders: -     CBC -     Comprehensive metabolic panel -     Urinalysis, Routine  w reflex microscopic -     POC Influenza A/B  Acute cystitis with hematuria Comments: + Klebsiella. Stop Macrobid, not tolerating. Still with LE in urine. Rx Cipro.    . Reviewed expectations re: course of current medical issues. . Discussed self-management of symptoms. . Outlined signs and symptoms indicating need for more acute intervention. . Patient verbalized understanding and all questions were answered. . See orders for this visit as documented in the electronic medical record. . Patient received an After-Visit Summary.   Briscoe Deutscher, D.O. [place], [facility]

## 2016-11-20 NOTE — Progress Notes (Signed)
Pre visit review using our clinic review tool, if applicable. No additional management support is needed unless otherwise documented below in the visit note. 

## 2016-12-11 ENCOUNTER — Other Ambulatory Visit (INDEPENDENT_AMBULATORY_CARE_PROVIDER_SITE_OTHER): Payer: Managed Care, Other (non HMO)

## 2016-12-11 DIAGNOSIS — Z0000 Encounter for general adult medical examination without abnormal findings: Secondary | ICD-10-CM | POA: Diagnosis not present

## 2016-12-11 DIAGNOSIS — E559 Vitamin D deficiency, unspecified: Secondary | ICD-10-CM | POA: Diagnosis not present

## 2016-12-11 LAB — LIPID PANEL
CHOLESTEROL: 170 mg/dL (ref 0–200)
HDL: 51.1 mg/dL (ref >39.00)
LDL CALC: 105 mg/dL — AB (ref 0–99)
NonHDL: 119.17
Total CHOL/HDL Ratio: 3
Triglycerides: 69 mg/dL (ref 0.0–149.0)
VLDL: 13.8 mg/dL (ref 0.0–40.0)

## 2016-12-11 LAB — CBC WITH DIFFERENTIAL/PLATELET
BASOS PCT: 0.5 % (ref 0.0–3.0)
Basophils Absolute: 0 10*3/uL (ref 0.0–0.1)
EOS PCT: 1.4 % (ref 0.0–5.0)
Eosinophils Absolute: 0.1 10*3/uL (ref 0.0–0.7)
HCT: 42.8 % (ref 36.0–46.0)
Hemoglobin: 14.7 g/dL (ref 12.0–15.0)
LYMPHS ABS: 1.6 10*3/uL (ref 0.7–4.0)
Lymphocytes Relative: 23.5 % (ref 12.0–46.0)
MCHC: 34.4 g/dL (ref 30.0–36.0)
MCV: 95.3 fl (ref 78.0–100.0)
MONO ABS: 0.4 10*3/uL (ref 0.1–1.0)
MONOS PCT: 5.4 % (ref 3.0–12.0)
NEUTROS PCT: 69.2 % (ref 43.0–77.0)
Neutro Abs: 4.6 10*3/uL (ref 1.4–7.7)
Platelets: 185 10*3/uL (ref 150.0–400.0)
RBC: 4.49 Mil/uL (ref 3.87–5.11)
RDW: 13.4 % (ref 11.5–15.5)
WBC: 6.7 10*3/uL (ref 4.0–10.5)

## 2016-12-11 LAB — COMPREHENSIVE METABOLIC PANEL
ALBUMIN: 4 g/dL (ref 3.5–5.2)
ALK PHOS: 74 U/L (ref 39–117)
ALT: 19 U/L (ref 0–35)
AST: 23 U/L (ref 0–37)
BUN: 17 mg/dL (ref 6–23)
CHLORIDE: 105 meq/L (ref 96–112)
CO2: 28 mEq/L (ref 19–32)
Calcium: 9.7 mg/dL (ref 8.4–10.5)
Creatinine, Ser: 0.85 mg/dL (ref 0.40–1.20)
GFR: 71.41 mL/min (ref >60.00)
GLUCOSE: 85 mg/dL (ref 70–99)
POTASSIUM: 4.2 meq/L (ref 3.5–5.1)
Sodium: 139 mEq/L (ref 135–145)
TOTAL PROTEIN: 6.6 g/dL (ref 6.0–8.3)
Total Bilirubin: 0.7 mg/dL (ref 0.2–1.2)

## 2016-12-11 LAB — TSH: TSH: 1.52 u[IU]/mL (ref 0.35–4.50)

## 2016-12-11 LAB — VITAMIN D 25 HYDROXY (VIT D DEFICIENCY, FRACTURES): VITD: 31.68 ng/mL (ref 30.00–100.00)

## 2016-12-18 ENCOUNTER — Encounter (INDEPENDENT_AMBULATORY_CARE_PROVIDER_SITE_OTHER): Payer: Self-pay

## 2016-12-18 ENCOUNTER — Ambulatory Visit (INDEPENDENT_AMBULATORY_CARE_PROVIDER_SITE_OTHER): Payer: Managed Care, Other (non HMO) | Admitting: Family Medicine

## 2016-12-18 ENCOUNTER — Encounter: Payer: Self-pay | Admitting: Family Medicine

## 2016-12-18 VITALS — BP 122/74 | HR 79 | Temp 98.4°F | Ht 64.5 in | Wt 178.5 lb

## 2016-12-18 DIAGNOSIS — M8589 Other specified disorders of bone density and structure, multiple sites: Secondary | ICD-10-CM

## 2016-12-18 DIAGNOSIS — E559 Vitamin D deficiency, unspecified: Secondary | ICD-10-CM

## 2016-12-18 DIAGNOSIS — Z0000 Encounter for general adult medical examination without abnormal findings: Secondary | ICD-10-CM | POA: Diagnosis not present

## 2016-12-18 DIAGNOSIS — E7800 Pure hypercholesterolemia, unspecified: Secondary | ICD-10-CM | POA: Diagnosis not present

## 2016-12-18 DIAGNOSIS — Z23 Encounter for immunization: Secondary | ICD-10-CM

## 2016-12-18 NOTE — Progress Notes (Signed)
Subjective:    Patient ID: [name] [name], female    DOB: [birth year], 66 y.o.   MRN: [id number]  HPI Here for health maintenance exam and to review chronic medical problems    Getting older and tired quicker    Wt Readings from Last 3 Encounters:  12/18/16 178 lb 8 oz (81 kg)  11/20/16 178 lb 3.2 oz (80.8 kg)  11/15/16 179 lb 12 oz (81.5 kg)  cutting back on sweets/ tea and calories  Exercise -bike 30 minutes around 5 days per week and avoids bread for the most part  Wants to loose weight  bmi 30.1  Hep C screen neg 2016 Declines hiv screen    Mammogram 10/17 negative Sees Dr Oswald Hillock Self breast exam - no lumps or changes   Pap/gyn care -Dr Denman George Had hysterectomy for endom hyperplasia    (mother had uterine ca) Had visit in the fall    Zoster vaccine 2/15 Flu vaccine declines  Tetanus vaccine 07- wants to get today   Colonoscopy/ screening : normal 8/16 with 10 year recall   dexa 10/17 osteopenia  D level is 31.6- taking 2000 iu  Exercising  No falls  No fractures    Hx of skin cancer/melanoma and basal cell Had skin screen in feb - no new findings/ did freeze some AKs   Hx of hyperlipidemia Lab Results  Component Value Date   CHOL 170 12/11/2016   CHOL 185 09/28/2015   CHOL 189 05/11/2015   Lab Results  Component Value Date   HDL 51.10 12/11/2016   HDL 47.80 09/28/2015   HDL 45 05/11/2015   Lab Results  Component Value Date   LDLCALC 105 (H) 12/11/2016   LDLCALC 102 (H) 09/28/2015   LDLCALC 126 (H) 05/11/2015   Lab Results  Component Value Date   TRIG 69.0 12/11/2016   TRIG 176.0 (H) 09/28/2015   TRIG 92 05/11/2015   Lab Results  Component Value Date   CHOLHDL 3 12/11/2016   CHOLHDL 4 09/28/2015   CHOLHDL 4.2 05/11/2015   Lab Results  Component Value Date   LDLDIRECT 124.4 09/12/2010    Lab on 12/11/2016  Component Date Value Ref Range Status  . WBC 12/11/2016 6.7  4.0 - 10.5 K/uL Final  . RBC 12/11/2016 4.49  3.87 -  5.11 Mil/uL Final  . Hemoglobin 12/11/2016 14.7  12.0 - 15.0 g/dL Final  . HCT 12/11/2016 42.8  36.0 - 46.0 % Final  . MCV 12/11/2016 95.3  78.0 - 100.0 fl Final  . MCHC 12/11/2016 34.4  30.0 - 36.0 g/dL Final  . RDW 12/11/2016 13.4  11.5 - 15.5 % Final  . Platelets 12/11/2016 185.0  150.0 - 400.0 K/uL Final  . Neutrophils Relative % 12/11/2016 69.2  43.0 - 77.0 % Final  . Lymphocytes Relative 12/11/2016 23.5  12.0 - 46.0 % Final  . Monocytes Relative 12/11/2016 5.4  3.0 - 12.0 % Final  . Eosinophils Relative 12/11/2016 1.4  0.0 - 5.0 % Final  . Basophils Relative 12/11/2016 0.5  0.0 - 3.0 % Final  . Neutro Abs 12/11/2016 4.6  1.4 - 7.7 K/uL Final  . Lymphs Abs 12/11/2016 1.6  0.7 - 4.0 K/uL Final  . Monocytes Absolute 12/11/2016 0.4  0.1 - 1.0 K/uL Final  . Eosinophils Absolute 12/11/2016 0.1  0.0 - 0.7 K/uL Final  . Basophils Absolute 12/11/2016 0.0  0.0 - 0.1 K/uL Final  . Sodium 12/11/2016 139  135 - 145 mEq/L Final  .  Potassium 12/11/2016 4.2  3.5 - 5.1 mEq/L Final  . Chloride 12/11/2016 105  96 - 112 mEq/L Final  . CO2 12/11/2016 28  19 - 32 mEq/L Final  . Glucose, Bld 12/11/2016 85  70 - 99 mg/dL Final  . BUN 12/11/2016 17  6 - 23 mg/dL Final  . Creatinine, Ser 12/11/2016 0.85  0.40 - 1.20 mg/dL Final  . Total Bilirubin 12/11/2016 0.7  0.2 - 1.2 mg/dL Final  . Alkaline Phosphatase 12/11/2016 74  39 - 117 U/L Final  . AST 12/11/2016 23  0 - 37 U/L Final  . ALT 12/11/2016 19  0 - 35 U/L Final  . Total Protein 12/11/2016 6.6  6.0 - 8.3 g/dL Final  . Albumin 12/11/2016 4.0  3.5 - 5.2 g/dL Final  . Calcium 12/11/2016 9.7  8.4 - 10.5 mg/dL Final  . GFR 12/11/2016 71.41  >60.00 mL/min Final  . Cholesterol 12/11/2016 170  0 - 200 mg/dL Final  . Triglycerides 12/11/2016 69.0  0.0 - 149.0 mg/dL Final  . HDL 12/11/2016 51.10  >39.00 mg/dL Final  . VLDL 12/11/2016 13.8  0.0 - 40.0 mg/dL Final  . LDL Cholesterol 12/11/2016 105* 0 - 99 mg/dL Final  . Total CHOL/HDL Ratio 12/11/2016 3    Final  . NonHDL 12/11/2016 119.17   Final  . TSH 12/11/2016 1.52  0.35 - 4.50 uIU/mL Final  . VITD 12/11/2016 31.68  30.00 - 100.00 ng/mL Final   Patient Active Problem List   Diagnosis Date Noted  . Palpitations 05/22/2015  . Pain in the chest   . OA (osteoarthritis) of knee 11/21/2014  . Colon cancer screening 11/29/2013  . Routine general medical examination at a health care facility 09/16/2011  . DERMATITIS, ATOPIC 11/27/2010  . URTICARIA DUE TO COLD OR HEAT 11/27/2010  . STRESS REACTION, ACUTE, WITH EMOTIONAL DISTURBANCE 09/14/2010  . THROAT PAIN, CHRONIC 09/14/2010  . EXTERNAL HEMORRHOIDS WITHOUT MENTION COMP 06/25/2010  . ABNORMAL EKG 12/26/2009  . OTHER CHRONIC SINUSITIS 10/19/2009  . Vitamin D deficiency 04/21/2009  . ALLERGIC RHINITIS 11/09/2008  . GERD 07/28/2008  . Osteopenia 03/14/2008  . HYPERCHOLESTEROLEMIA, PURE 03/13/2007  . HIATAL HERNIA 01/12/2007   Past Medical History:  Diagnosis Date  . Acute meniscal tear of knee   . Arthritis    "neck; left knee before replacement" (05/10/2015)  . Basal cell cancer 5/08  . Breast cyst    "left"  . Chest pain 9/11   [facility]) and palpitations - ruled out for MI with nl. Echo  . Complication of anesthesia HARD TO WAKE  . Difficulty sleeping   . Eczema   . GERD (gastroesophageal reflux disease)   . Heart palpitations OCCASIONAL--  TAKES ATENOLOL PRN  . Hiatal hernia   . HSV-2 infection    buttocks  . Hyperlipidemia DIET CONTROL  . Melanoma of skin ([place])   . MVP (mitral valve prolapse)    per pt no treatment needed  . Normal nuclear stress test 07-09-10  . Osteopenia 12/2013   T score -1.6 FRAX 16%/0.8%  . PONV (postoperative nausea and vomiting)    Past Surgical History:  Procedure Laterality Date  . Carotid US  9/05   Mild, recheck 1 year  . Carotid US  10/06   Normal  . CHONDROPLASTY  10/23/2011   Procedure: CHONDROPLASTY;  Surgeon: Gearlean Alf, MD;  Location: [facility];  Service:  Orthopedics;  Laterality: Left;  Left medial   . COLONOSCOPY    .  ESOPHAGOGASTRODUODENOSCOPY  9/07   Normal  . HYSTEROSCOPY WITH RESECTOSCOPE  2000   POLPECTOMY'S  . JOINT REPLACEMENT    . KNEE ARTHROSCOPY  10/23/2011   Procedure: ARTHROSCOPY KNEE;  Surgeon: Gearlean Alf, MD;  Location: [facility];  Service: Orthopedics;  Laterality: Left;  LEFT KNEE SCOPE WITH DEBRIDEMENT   . MELANOMA EXCISION Right 1995   "side"  . MOHS SURGERY Left    "side of my nose"  . MOLE REMOVAL     "several; all over my body"  . MRI neck  3/00   C4-C5 herniation small stenosis mild C4-5, C5-6  . TONSILLECTOMY    . TOTAL KNEE ARTHROPLASTY Left 11/21/2014   Procedure: LEFT TOTAL KNEE ARTHROPLASTY;  Surgeon: Gearlean Alf, MD;  Location: [facility];  Service: Orthopedics;  Laterality: Left;  . TRANSTHORACIC ECHOCARDIOGRAM  06-29-2010   NORMAL LVSF, EF 55-60%,  MILD MR  . TUBAL LIGATION  1989  . VAGINAL HYSTERECTOMY  2001   complex hyperplasia with mother's history of uterine cancer   Social History  Substance Use Topics  . Smoking status: Former Smoker    Packs/day: 1.00    Years: 15.00    Types: Cigarettes    Quit date: 10/07/1978  . Smokeless tobacco: Never Used  . Alcohol use No   Family History  Problem Relation Age of Onset  . Cancer Mother     uterine  . Obesity Mother   . Thrombocytopenia Mother   . Diabetes Mother   . Alcohol abuse Father   . Heart disease Father     CAD in 56's  . Emphysema Father   . Diabetes Maternal Grandmother     type 2  . Heart disease Paternal Aunt     CAD  . Heart disease Paternal Uncle     CAD  . Colon cancer Neg Hx    Allergies  Allergen Reactions  . Amoxicillin Rash  . Codeine Rash  . Macrobid [Nitrofurantoin]     GI issues  . Clarithromycin Other (See Comments)    REACTION: reaction not known   Current Outpatient Prescriptions on File Prior to Visit  Medication Sig Dispense Refill  . acyclovir (ZOVIRAX) 200 MG capsule TAKE 1  CAPSULE BY MOUTH DAILY AS NEEDED 60 capsule 3  . acyclovir cream (ZOVIRAX) 5 % Apply 1 application topically daily as needed. 5 g 3  . Ascorbic Acid (VITAMIN C PO) Take 1 tablet by mouth daily.    Marland Kitchen aspirin EC 81 MG tablet Take 1 tablet (81 mg total) by mouth daily.    Marland Kitchen atenolol (TENORMIN) 25 MG tablet Take 0.5 tablets (12.5 mg total) by mouth daily. 30 tablet 11  . CALCIUM CARBONATE PO Take 1 tablet by mouth daily.    . Cholecalciferol (VITAMIN D PO) Take 1 tablet by mouth daily.    Marland Kitchen CINNAMON PO Take 1 capsule by mouth. Takes occasionally    . CYANOCOBALAMIN PO Take 1 tablet by mouth daily.    Marland Kitchen KRILL OIL PO Take by mouth. Takes 1 tab occasionally    . magnesium gluconate (MAGONATE) 500 MG tablet Take 500 mg by mouth daily.    . Multiple Vitamin (MULTIVITAMIN) capsule Take 1 capsule by mouth daily.    Vladimir Faster Glycol-Propyl Glycol (SYSTANE OP) Apply 1 drop to eye daily as needed (Dry eyes).    . sucralfate (CARAFATE) 1 g tablet Take 1 tablet (1 g total) by mouth 4 (four) times daily -  with meals  and at bedtime. 60 tablet 0   Current Facility-Administered Medications on File Prior to Visit  Medication Dose Route Frequency Provider Last Rate Last Dose  . gi cocktail (Maalox,Lidocaine,Donnatal)  30 mL Oral Once Briscoe Deutscher, DO        Review of Systems Review of Systems  Constitutional: Negative for fever, appetite change,  and unexpected weight change.  Eyes: Negative for pain and visual disturbance.  Respiratory: Negative for cough and shortness of breath.   Cardiovascular: Negative for cp or palpitations    Gastrointestinal: Negative for nausea, diarrhea and constipation.  Genitourinary: Negative for urgency and frequency.  Skin: Negative for pallor or rash   Neurological: Negative for weakness, light-headedness, numbness and headaches.  Hematological: Negative for adenopathy. Does not bruise/bleed easily.  Psychiatric/Behavioral: Negative for dysphoric mood. The patient is not  nervous/anxious.         Objective:   Physical Exam  Constitutional: She appears well-developed and well-nourished. No distress.  overwt and well appearing   HENT:  Head: Normocephalic and atraumatic.  Right Ear: External ear normal.  Left Ear: External ear normal.  Nose: Nose normal.  Mouth/Throat: Oropharynx is clear and moist.  Eyes: Conjunctivae and EOM are normal. Pupils are equal, round, and reactive to light. Right eye exhibits no discharge. Left eye exhibits no discharge. No scleral icterus.  Neck: Normal range of motion. Neck supple. No JVD present. Carotid bruit is not present. No thyromegaly present.  Cardiovascular: Normal rate, regular rhythm, normal heart sounds and intact distal pulses.  Exam reveals no gallop.   Pulmonary/Chest: Effort normal and breath sounds normal. No respiratory distress. She has no wheezes. She has no rales.  Abdominal: Soft. Bowel sounds are normal. She exhibits no distension and no mass. There is no tenderness.  Musculoskeletal: She exhibits no edema or tenderness.  Lymphadenopathy:    She has no cervical adenopathy.  Neurological: She is alert. She has normal reflexes. No cranial nerve deficit. She exhibits normal muscle tone. Coordination normal.  Skin: Skin is warm and dry. No rash noted. No erythema. No pallor.  sks ans lentigines diffusely  Some recently treated aks   Psychiatric: She has a normal mood and affect.          Assessment & Plan:   Problem List Items Addressed This Visit      Musculoskeletal and Integument   Osteopenia - Primary    Rev dexa 10/17 from gyn  Osteopenia/fairly stable  D level is 31.6- will add more to get this up a bit  Disc need for calcium/ vitamin D/ wt bearing exercise and bone density test every 2 y to monitor Disc safety/ fracture risk in detail   No falls or fractures         Other   HYPERCHOLESTEROLEMIA, PURE    Disc goals for lipids and reasons to control them Rev labs with pt Rev low  sat fat diet in detail  Fairly well controlled       Routine general medical examination at a health care facility    Reviewed health habits including diet and exercise and skin cancer prevention Reviewed appropriate screening tests for age  Also reviewed health mt list, fam hx and immunization status , as well as social and family history   See HPI Wt loss enc  decl HIV screen  Gyn care utd  imms utd (Tdap today)  Colon screen utd  dexa utd  Disc labs/ low chol diet  Continue reg derm f/u and  sun protection        Vitamin D deficiency    D level is 31.6 On 2000 iu plus other sources Will inc by another 1000-2000 iu daily  Disc imp to bone and overall health       Other Visit Diagnoses    Need for Tdap vaccination       Relevant Orders   Tdap vaccine greater than or equal to 7yo IM (Completed)

## 2016-12-18 NOTE — Progress Notes (Signed)
Pre visit review using our clinic review tool, if applicable. No additional management support is needed unless otherwise documented below in the visit note. 

## 2016-12-18 NOTE — Patient Instructions (Addendum)
Goal is 30 or more minutes of exercise daily - start adding some resistance training  Keep avoiding high calorie foods and refined carbs  Add a little more vit D if you want to 1000- 2000 iu   Cholesterol looks pretty good - the HDL / good cholesterol is going up   Tdap vaccine today

## 2016-12-19 NOTE — Assessment & Plan Note (Signed)
D level is 31.6 On 2000 iu plus other sources Will inc by another 1000-2000 iu daily  Disc imp to bone and overall health

## 2016-12-19 NOTE — Assessment & Plan Note (Signed)
Reviewed health habits including diet and exercise and skin cancer prevention Reviewed appropriate screening tests for age  Also reviewed health mt list, fam hx and immunization status , as well as social and family history   See HPI Wt loss enc  decl HIV screen  Gyn care utd  imms utd (Tdap today)  Colon screen utd  dexa utd  Disc labs/ low chol diet  Continue reg derm f/u and sun protection

## 2016-12-19 NOTE — Assessment & Plan Note (Signed)
Disc goals for lipids and reasons to control them Rev labs with pt Rev low sat fat diet in detail  Fairly well controlled 

## 2016-12-19 NOTE — Assessment & Plan Note (Signed)
Rev dexa 10/17 from gyn  Osteopenia/fairly stable  D level is 31.6- will add more to get this up a bit  Disc need for calcium/ vitamin D/ wt bearing exercise and bone density test every 2 y to monitor Disc safety/ fracture risk in detail   No falls or fractures

## 2016-12-25 ENCOUNTER — Telehealth: Payer: Self-pay

## 2016-12-25 NOTE — Telephone Encounter (Signed)
Pt saw her plt count and is concerned that it has dropped;pts mom had leukemia and pt is concerned about this hx that she might be having a potential problem. Pt request cb.

## 2016-12-25 NOTE — Telephone Encounter (Signed)
It is still very much  within the normal range- platelets tend to change a bit year to year  I am not worried about it at all  If she is still very concerned please let me know

## 2016-12-26 NOTE — Telephone Encounter (Signed)
I can send her for oncology referral to discuss risks/ fam hx etc if she wants (and if they will see her for that)  Does she have a preference re: who she sees?

## 2016-12-26 NOTE — Telephone Encounter (Signed)
Pt notified of Dr. Marliss Coots comments and I read her all of her plt count #'s from the past few years and pt is still very concerned she is still worried about her #'s given her mother's history

## 2016-12-27 NOTE — Telephone Encounter (Signed)
Thanks- please tell her that - I suspected that may be the case She may be interested in genetics consult-please ask her , thanks

## 2016-12-27 NOTE — Telephone Encounter (Signed)
Left voicemail requesting pt to call the office back 

## 2016-12-27 NOTE — Telephone Encounter (Signed)
Spoke with Rosaria Ferries before I called pt. She called the cancer center and they said their doctor's will not see a pt with normal labs and no confirmed cancer but if she was worried the only doctor that would see her would be the genetics dpt for genetics counseling, would you want to refer her for that before I call pt or did you have any other recommendations?, (I haven't called pt yet)

## 2016-12-30 NOTE — Telephone Encounter (Signed)
Pt returned your call. Please call [phone].

## 2016-12-30 NOTE — Telephone Encounter (Signed)
Pt said she will think about the genetics consult and check with her insurance to see if it's covered and if she decides she wants a referral she will call us back

## 2017-03-19 DIAGNOSIS — S40011A Contusion of right shoulder, initial encounter: Secondary | ICD-10-CM | POA: Diagnosis not present

## 2017-03-26 DIAGNOSIS — S40011D Contusion of right shoulder, subsequent encounter: Secondary | ICD-10-CM | POA: Diagnosis not present

## 2017-04-05 ENCOUNTER — Emergency Department (HOSPITAL_COMMUNITY): Payer: PPO

## 2017-04-05 ENCOUNTER — Emergency Department (HOSPITAL_COMMUNITY)
Admission: EM | Admit: 2017-04-05 | Discharge: 2017-04-06 | Disposition: A | Discharge status: Home / Self Care | Payer: PPO | Source: Home / Self Care

## 2017-04-05 ENCOUNTER — Encounter (HOSPITAL_COMMUNITY): Payer: Self-pay

## 2017-04-05 DIAGNOSIS — Y9389 Activity, other specified: Secondary | ICD-10-CM | POA: Insufficient documentation

## 2017-04-05 DIAGNOSIS — Y929 Unspecified place or not applicable: Secondary | ICD-10-CM

## 2017-04-05 DIAGNOSIS — Z7982 Long term (current) use of aspirin: Secondary | ICD-10-CM | POA: Insufficient documentation

## 2017-04-05 DIAGNOSIS — S20302A Unspecified superficial injuries of left front wall of thorax, initial encounter: Secondary | ICD-10-CM

## 2017-04-05 DIAGNOSIS — Y999 Unspecified external cause status: Secondary | ICD-10-CM

## 2017-04-05 DIAGNOSIS — Z96652 Presence of left artificial knee joint: Secondary | ICD-10-CM | POA: Diagnosis not present

## 2017-04-05 DIAGNOSIS — R0781 Pleurodynia: Secondary | ICD-10-CM | POA: Diagnosis not present

## 2017-04-05 DIAGNOSIS — Z85828 Personal history of other malignant neoplasm of skin: Secondary | ICD-10-CM | POA: Insufficient documentation

## 2017-04-05 DIAGNOSIS — S20212A Contusion of left front wall of thorax, initial encounter: Secondary | ICD-10-CM | POA: Insufficient documentation

## 2017-04-05 DIAGNOSIS — Z5321 Procedure and treatment not carried out due to patient leaving prior to being seen by health care provider: Secondary | ICD-10-CM

## 2017-04-05 DIAGNOSIS — Z87891 Personal history of nicotine dependence: Secondary | ICD-10-CM | POA: Insufficient documentation

## 2017-04-05 DIAGNOSIS — S299XXA Unspecified injury of thorax, initial encounter: Secondary | ICD-10-CM | POA: Diagnosis not present

## 2017-04-05 DIAGNOSIS — W010XXA Fall on same level from slipping, tripping and stumbling without subsequent striking against object, initial encounter: Secondary | ICD-10-CM

## 2017-04-05 DIAGNOSIS — Z79899 Other long term (current) drug therapy: Secondary | ICD-10-CM | POA: Insufficient documentation

## 2017-04-05 NOTE — ED Triage Notes (Signed)
States fell about one week ago and still has left rib pain worse today than when it happened able to speak in full sentences trachea is midline.

## 2017-04-06 ENCOUNTER — Encounter (HOSPITAL_COMMUNITY): Payer: Self-pay | Admitting: *Deleted

## 2017-04-06 ENCOUNTER — Emergency Department (HOSPITAL_COMMUNITY)
Admission: EM | Admit: 2017-04-06 | Discharge: 2017-04-06 | Disposition: A | Discharge status: Home / Self Care | Payer: PPO | Attending: Emergency Medicine | Admitting: Emergency Medicine

## 2017-04-06 DIAGNOSIS — Y9389 Activity, other specified: Secondary | ICD-10-CM | POA: Diagnosis not present

## 2017-04-06 DIAGNOSIS — S20212A Contusion of left front wall of thorax, initial encounter: Secondary | ICD-10-CM | POA: Diagnosis not present

## 2017-04-06 DIAGNOSIS — Z96652 Presence of left artificial knee joint: Secondary | ICD-10-CM | POA: Diagnosis not present

## 2017-04-06 DIAGNOSIS — S299XXA Unspecified injury of thorax, initial encounter: Secondary | ICD-10-CM | POA: Diagnosis present

## 2017-04-06 DIAGNOSIS — Z85828 Personal history of other malignant neoplasm of skin: Secondary | ICD-10-CM | POA: Diagnosis not present

## 2017-04-06 DIAGNOSIS — Z7982 Long term (current) use of aspirin: Secondary | ICD-10-CM | POA: Diagnosis not present

## 2017-04-06 DIAGNOSIS — Y999 Unspecified external cause status: Secondary | ICD-10-CM | POA: Diagnosis not present

## 2017-04-06 DIAGNOSIS — Z79899 Other long term (current) drug therapy: Secondary | ICD-10-CM | POA: Diagnosis not present

## 2017-04-06 DIAGNOSIS — Y929 Unspecified place or not applicable: Secondary | ICD-10-CM | POA: Diagnosis not present

## 2017-04-06 DIAGNOSIS — Z87891 Personal history of nicotine dependence: Secondary | ICD-10-CM | POA: Diagnosis not present

## 2017-04-06 DIAGNOSIS — W010XXA Fall on same level from slipping, tripping and stumbling without subsequent striking against object, initial encounter: Secondary | ICD-10-CM | POA: Diagnosis not present

## 2017-04-06 MED ORDER — TRAMADOL HCL 50 MG PO TABS: 50.0000 mg | ORAL_TABLET | Freq: Four times a day (QID) | ORAL | 0 refills | Status: DC | PRN

## 2017-04-06 NOTE — ED Provider Notes (Signed)
[facility] DEPT Provider Note   CSN: [id number] Arrival date & time: 04/06/17  7939     History   Chief Complaint Chief Complaint  Patient presents with  . Rib Injury    HPI [name] [name] is a 66 y.o. female.  The history is provided by the patient.  She fell is ago while playing pipette, and injured her left lateral rib cage. Anes has been getting worse and is now spreading toward the front of her chest. She was worried that she might of cracked rib and might puncture along. She has been using ice and naproxen, but without adequate pain relief. She rates pain at 8/10. She denies other injury. She had gone to [facility] emergency department and had x-rays, but left and came here because the wait was too long.  Past Medical History:  Diagnosis Date  . Acute meniscal tear of knee   . Arthritis    "neck; left knee before replacement" (05/10/2015)  . Basal cell cancer 5/08  . Breast cyst    "left"  . Chest pain 9/11   [facility]) and palpitations - ruled out for MI with nl. Echo  . Complication of anesthesia HARD TO WAKE  . Difficulty sleeping   . Eczema   . GERD (gastroesophageal reflux disease)   . Heart palpitations OCCASIONAL--  TAKES ATENOLOL PRN  . Hiatal hernia   . HSV-2 infection    buttocks  . Hyperlipidemia DIET CONTROL  . Melanoma of skin ([place])   . MVP (mitral valve prolapse)    per pt no treatment needed  . Normal nuclear stress test 07-09-10  . Osteopenia 12/2013   T score -1.6 FRAX 16%/0.8%  . PONV (postoperative nausea and vomiting)     Patient Active Problem List   Diagnosis Date Noted  . Palpitations 05/22/2015  . Pain in the chest   . OA (osteoarthritis) of knee 11/21/2014  . Colon cancer screening 11/29/2013  . Routine general medical examination at a health care facility 09/16/2011  . DERMATITIS, ATOPIC 11/27/2010  . URTICARIA DUE TO COLD OR HEAT 11/27/2010  . STRESS REACTION, ACUTE, WITH EMOTIONAL DISTURBANCE 09/14/2010  . THROAT PAIN,  CHRONIC 09/14/2010  . EXTERNAL HEMORRHOIDS WITHOUT MENTION COMP 06/25/2010  . ABNORMAL EKG 12/26/2009  . OTHER CHRONIC SINUSITIS 10/19/2009  . Vitamin D deficiency 04/21/2009  . ALLERGIC RHINITIS 11/09/2008  . GERD 07/28/2008  . Osteopenia 03/14/2008  . HYPERCHOLESTEROLEMIA, PURE 03/13/2007  . HIATAL HERNIA 01/12/2007    Past Surgical History:  Procedure Laterality Date  . Carotid US  9/05   Mild, recheck 1 year  . Carotid US  10/06   Normal  . CHONDROPLASTY  10/23/2011   Procedure: CHONDROPLASTY;  Surgeon: Gearlean Alf, MD;  Location: [facility];  Service: Orthopedics;  Laterality: Left;  Left medial   . COLONOSCOPY    . ESOPHAGOGASTRODUODENOSCOPY  9/07   Normal  . HYSTEROSCOPY WITH RESECTOSCOPE  2000   POLPECTOMY'S  . JOINT REPLACEMENT    . KNEE ARTHROSCOPY  10/23/2011   Procedure: ARTHROSCOPY KNEE;  Surgeon: Gearlean Alf, MD;  Location[facility];  Service: Orthopedics;  Laterality: Left;  LEFT KNEE SCOPE WITH DEBRIDEMENT   . MELANOMA EXCISION Right 1995   "side"  . MOHS SURGERY Left    "side of my nose"  . MOLE REMOVAL     "several; all over my body"  . MRI neck  3/00   C4-C5 herniation small stenosis mild C4-5, C5-6  .  TONSILLECTOMY    . TOTAL KNEE ARTHROPLASTY Left 11/21/2014   Procedure: LEFT TOTAL KNEE ARTHROPLASTY;  Surgeon: Gearlean Alf, MD;  Location: [facility];  Service: Orthopedics;  Laterality: Left;  . TRANSTHORACIC ECHOCARDIOGRAM  06-29-2010   NORMAL LVSF, EF 55-60%,  MILD MR  . TUBAL LIGATION  1989  . VAGINAL HYSTERECTOMY  2001   complex hyperplasia with mother's history of uterine cancer    OB History    Gravida Para Term Preterm AB Living   3 3       3    SAB TAB Ectopic Multiple Live Births                   Home Medications    Prior to Admission medications   Medication Sig Start Date End Date Taking? Authorizing Provider  acyclovir (ZOVIRAX) 200 MG capsule TAKE 1 CAPSULE BY MOUTH DAILY AS NEEDED  07/22/16   Tower, Wynelle Fanny, MD  acyclovir cream (ZOVIRAX) 5 % Apply 1 application topically daily as needed. 07/22/16   Tower, Wynelle Fanny, MD  Ascorbic Acid (VITAMIN C PO) Take 1 tablet by mouth daily.    [provider]  aspirin EC 81 MG tablet Take 1 tablet (81 mg total) by mouth daily. 05/11/15   Donne Hazel, MD  atenolol (TENORMIN) 25 MG tablet Take 0.5 tablets (12.5 mg total) by mouth daily. 05/22/15   Tower, Wynelle Fanny, MD  CALCIUM CARBONATE PO Take 1 tablet by mouth daily.    [provider]  Cholecalciferol (VITAMIN D PO) Take 1 tablet by mouth daily.    [provider]  CINNAMON PO Take 1 capsule by mouth. Takes occasionally    [provider]  CYANOCOBALAMIN PO Take 1 tablet by mouth daily.    [provider]  KRILL OIL PO Take by mouth. Takes 1 tab occasionally    [provider]  magnesium gluconate (MAGONATE) 500 MG tablet Take 500 mg by mouth daily.    [provider]  Multiple Vitamin (MULTIVITAMIN) capsule Take 1 capsule by mouth daily.    [provider]  Polyethyl Glycol-Propyl Glycol (SYSTANE OP) Apply 1 drop to eye daily as needed (Dry eyes).    [provider]  sucralfate (CARAFATE) 1 g tablet Take 1 tablet (1 g total) by mouth 4 (four) times daily -  with meals and at bedtime. 11/20/16   Briscoe Deutscher, DO  traMADol (ULTRAM) 50 MG tablet Take 1 tablet (50 mg total) by mouth every 6 (six) hours as needed. 01/12/26   Delora Fuel, MD    Family History Family History  Problem Relation Age of Onset  . Cancer Mother        uterine  . Obesity Mother   . Thrombocytopenia Mother   . Diabetes Mother   . Alcohol abuse Father   . Heart disease Father        CAD in 44's  . Emphysema Father   . Diabetes Maternal Grandmother        type 2  . Heart disease Paternal Aunt        CAD  . Heart disease Paternal Uncle        CAD  . Colon cancer Neg Hx     Social History Social History  Substance Use  Topics  . Smoking status: Former Smoker    Packs/day: 1.00    Years: 15.00    Types: Cigarettes    Quit date: 10/07/1978  . Smokeless tobacco: Never  Used  . Alcohol use No     Allergies   Amoxicillin; Codeine; Macrobid [nitrofurantoin]; and Clarithromycin   Review of Systems Review of Systems  All other systems reviewed and are negative.    Physical Exam Updated Vital Signs BP (!) 143/76 (BP Location: Left Arm)   Pulse 71   Temp 97.7 F (36.5 C) (Oral)   Resp 18   SpO2 95%   Physical Exam  Nursing note and vitals reviewed.  66 year old female, resting comfortably and in no acute distress. Vital signs are significant for borderline hypertension. Oxygen saturation is 95%, which is normal. Head is normocephalic and atraumatic. PERRLA, EOMI. Oropharynx is clear. Neck is nontender and supple without adenopathy or JVD. Back is nontender and there is no CVA tenderness. Lungs are clear without rales, wheezes, or rhonchi. Chest has point tenderness in the left lower rib cage in the anterior axillary line. There is no crepitus or deformity. Heart has regular rate and rhythm without murmur. Abdomen is soft, flat, nontender without masses or hepatosplenomegaly and peristalsis is normoactive. Extremities have no cyanosis or edema, full range of motion is present. Skin is warm and dry without rash. Neurologic: Mental status is normal, cranial nerves are intact, there are no motor or sensory deficits.  ED Treatments / Results   Radiology Dg Ribs Unilateral W/chest Left  Result Date: 04/05/2017 CLINICAL DATA:  Persistent pain in the anterior left chest wall after falling a few days ago. EXAM: LEFT RIBS AND CHEST - 3+ VIEW COMPARISON:  05/10/2015 FINDINGS: No fracture or other bone lesions are seen involving the ribs. Moderate thoracolumbar scoliosis. There is no evidence of pneumothorax or pleural effusion. Both lungs are clear. Heart size and mediastinal contours are within normal  limits. IMPRESSION: Negative. Electronically Signed   By: Andreas Newport M.D.   On: 04/05/2017 21:39    Procedures Procedures (including critical care time)  Medications Ordered in ED Medications - No data to display   Initial Impression / Assessment and Plan / ED Course  I have reviewed the triage vital signs and the nursing notes.  Pertinent imaging results that were available during my care of the patient were reviewed by me and considered in my medical decision making (see chart for details).  Fall with chest wall injury. X-rays show no evidence of fracture. Patient advised to continue using ice and taking naproxen. Advised to use acetaminophen to gain additional pain relief. Prescription given for tramadol. Follow-up with PCP.  Final Clinical Impressions(s) / ED Diagnoses   Final diagnoses:  Contusion of left chest wall, initial encounter    New Prescriptions New Prescriptions   TRAMADOL (ULTRAM) 50 MG TABLET    Take 1 tablet (50 mg total) by mouth every 6 (six) hours as needed.     Delora Fuel, MD 00/93/81 [phone]

## 2017-04-06 NOTE — ED Triage Notes (Signed)
Pt fell while playing mini golf on Wednesday and struck her chest.  Pt has bruising on her breast.  Pt reports that the pain is severe.  Pt went to [facility] yesterday and had an x-ray but ended up leaving before being seen due to length of wait.  Pt denies any sob with this.

## 2017-04-06 NOTE — Discharge Instructions (Signed)
Take naproxen and/or acetaminophen as needed for pain. Reserve Tramadol for more severe pain. Tramadol can be combined with naproxen and acetaminophen to get better pain relief.

## 2017-04-14 ENCOUNTER — Other Ambulatory Visit: Payer: Self-pay

## 2017-04-14 MED ORDER — TRAMADOL HCL 50 MG PO TABS: 50.0000 mg | ORAL_TABLET | Freq: Four times a day (QID) | ORAL | 0 refills | Status: DC | PRN

## 2017-04-14 NOTE — Telephone Encounter (Signed)
Px written for call in   Please ask her to bring the torn px to [place] so we can shred it since it is a controlled [place] she feels better now  [place] and she has not filled this px

## 2017-04-14 NOTE — Telephone Encounter (Signed)
Pt left v/m; pt seen [place] for a fall on 04/06/17; pt tore off the bottom of the tramadol rx and pharmacy will not fill rx. Pt request Dr Glori Bickers to send in tramadol 50 mg taking one tab po q 6 h prn. # 15.Please advise.  pt last seen 12/18/16 for annual exam Tylenol and aleve is bothering pt stomach. Pt request cb.

## 2017-04-15 NOTE — Telephone Encounter (Signed)
Lm on pts vm requesting call back. Per Dr Glori Bickers, pt is to bring back old Rx before calling in to pharmacy

## 2017-04-15 NOTE — Telephone Encounter (Signed)
Called pt and advise her to bring in old Rx and she advise me that she tore up the Rx and threw it away. She no longer has the Rx at all it's in the dumpster. Rx not called in until Dr. Glori Bickers advises on what to do

## 2017-04-15 NOTE — Telephone Encounter (Signed)
Pt returned your call. [phone]

## 2017-04-23 DIAGNOSIS — S40011D Contusion of right shoulder, subsequent encounter: Secondary | ICD-10-CM | POA: Diagnosis not present

## 2017-05-02 ENCOUNTER — Encounter: Payer: Self-pay | Admitting: Family Medicine

## 2017-05-02 ENCOUNTER — Ambulatory Visit (INDEPENDENT_AMBULATORY_CARE_PROVIDER_SITE_OTHER): Payer: PPO | Admitting: Family Medicine

## 2017-05-02 VITALS — BP 110/78 | HR 68 | Temp 97.5°F | Ht 64.5 in | Wt 179.5 lb

## 2017-05-02 DIAGNOSIS — R350 Frequency of micturition: Secondary | ICD-10-CM | POA: Insufficient documentation

## 2017-05-02 DIAGNOSIS — R1031 Right lower quadrant pain: Secondary | ICD-10-CM | POA: Diagnosis not present

## 2017-05-02 LAB — POC URINALSYSI DIPSTICK (AUTOMATED)
BILIRUBIN UA: NEGATIVE
Glucose, UA: NEGATIVE
KETONES UA: NEGATIVE
Leukocytes, UA: NEGATIVE
NITRITE UA: NEGATIVE
PH UA: 6 (ref 5.0–8.0)
Protein, UA: NEGATIVE
RBC UA: NEGATIVE
Spec Grav, UA: 1.015 (ref 1.010–1.025)
Urobilinogen, UA: 0.2 E.U./dL

## 2017-05-02 MED ORDER — TRAMADOL HCL 50 MG PO TABS: 50.0000 mg | ORAL_TABLET | Freq: Four times a day (QID) | ORAL | 0 refills | Status: DC | PRN

## 2017-05-02 NOTE — Assessment & Plan Note (Signed)
NO UTI. Appendicitis unlikely given exam.  Right sided diverticulitis possible but unlikely.  ? Related to function GI issue, constipation, gas etc. Increase fiber, water.  If not improving consider [place] eval of ovaries.

## 2017-05-02 NOTE — Progress Notes (Signed)
   Subjective:    Patient ID: [name] [name], female    DOB: Dec 26, 1950, 66 y.o.   MRN: [id number]  HPI    66 year old female  Pt of Dr. Marliss Coots with history of recent ER visit for chest injury presents with new onset  Right lower quadrant pain, then went to left lower abdomen, then progessed to low back pain and increase in urinary frequency. Pain in RLQ increases with bending, moderate.  Occ uncomfortable with urination. No fever.  No D/ occ constipation.[name] colace this AM, small BM this morning.. No blood in stool.   Drinking cranberry.  Had similar pain in past.. D.r tower checked a  CT.Marland Kitchen Showed for constipation.   Cut rx of tramadol for recent arm rib injury.. Request repeat rx given she cut it apart and pahrmacy would not accept tampered script.  Review of Systems  Constitutional: Negative for fatigue and fever.  HENT: Negative for ear pain.   Eyes: Negative for pain.  Respiratory: Negative for chest tightness and shortness of breath.   Cardiovascular: Negative for chest pain, palpitations and leg swelling.  Gastrointestinal: Negative for abdominal pain.  Genitourinary: Negative for dysuria.       Objective:   Physical Exam  Constitutional: Vital signs are normal. She appears well-developed and well-nourished. She is cooperative.  Non-toxic appearance. She does not appear ill. No distress.  HENT:  Head: Normocephalic.  Right Ear: Hearing, tympanic membrane, external ear and ear canal normal. Tympanic membrane is not erythematous, not retracted and not bulging.  Left Ear: Hearing, tympanic membrane, external ear and ear canal normal. Tympanic membrane is not erythematous, not retracted and not bulging.  Nose: No mucosal edema or rhinorrhea. Right sinus exhibits no maxillary sinus tenderness and no frontal sinus tenderness. Left sinus exhibits no maxillary sinus tenderness and no frontal sinus tenderness.  Mouth/Throat: Uvula is midline, oropharynx is clear and moist and  mucous membranes are normal.  Eyes: Pupils are equal, round, and reactive to light. Conjunctivae, EOM and lids are normal. Lids are everted and swept, no foreign bodies found.  Neck: Trachea normal and normal range of motion. Neck supple. Carotid bruit is not present. No thyroid mass and no thyromegaly present.  Cardiovascular: Normal rate, regular rhythm, S1 normal, S2 normal, normal heart sounds, intact distal pulses and normal pulses.  Exam reveals no gallop and no friction rub.   No murmur heard. Pulmonary/Chest: Effort normal and breath sounds normal. No tachypnea. No respiratory distress. She has no decreased breath sounds. She has no wheezes. She has no rhonchi. She has no rales.  Abdominal: Soft. Normal appearance and bowel sounds are normal. There is no hepatosplenomegaly. There is tenderness in the right lower quadrant. There is no rigidity, no rebound, no guarding, no CVA tenderness, no tenderness at McBurney's point and negative Murphy's sign.  Musculoskeletal:       Lumbar back: She exhibits normal range of motion, no tenderness and no bony tenderness.  Neg SLR neg faber's  Neurological: She is alert.  Skin: Skin is warm, dry and intact. No rash noted.  Psychiatric: Her speech is normal and behavior is normal. Judgment and thought content normal. Her mood appears not anxious. Cognition and memory are normal. She does not exhibit a depressed mood.          Assessment & Plan:

## 2017-05-02 NOTE — Patient Instructions (Addendum)
Rest, increase fiber and water in diet.  Call if fever, increasing pain.  Call if not improving in next week.. for consideration of [place].

## 2017-05-30 ENCOUNTER — Other Ambulatory Visit: Payer: Self-pay | Admitting: Gynecology

## 2017-05-30 DIAGNOSIS — Z1231 Encounter for screening mammogram for malignant neoplasm of breast: Secondary | ICD-10-CM

## 2017-06-12 ENCOUNTER — Ambulatory Visit (INDEPENDENT_AMBULATORY_CARE_PROVIDER_SITE_OTHER): Payer: PPO | Admitting: Family Medicine

## 2017-06-12 ENCOUNTER — Encounter: Payer: Self-pay | Admitting: Family Medicine

## 2017-06-12 VITALS — BP 102/72 | HR 84 | Temp 97.7°F | Ht 64.5 in | Wt 176.2 lb

## 2017-06-12 DIAGNOSIS — N3000 Acute cystitis without hematuria: Secondary | ICD-10-CM | POA: Diagnosis not present

## 2017-06-12 DIAGNOSIS — R300 Dysuria: Secondary | ICD-10-CM | POA: Diagnosis not present

## 2017-06-12 LAB — POCT UA - MICROSCOPIC ONLY

## 2017-06-12 LAB — POC URINALSYSI DIPSTICK (AUTOMATED)
Bilirubin, UA: NEGATIVE
Glucose, UA: NEGATIVE
Ketones, UA: NEGATIVE
NITRITE UA: NEGATIVE
PH UA: 6 (ref 5.0–8.0)
PROTEIN UA: NEGATIVE
Spec Grav, UA: >=1.03 — AB (ref 1.010–1.025)
Urobilinogen, UA: 0.2 E.U./dL

## 2017-06-12 MED ORDER — SULFAMETHOXAZOLE-TRIMETHOPRIM 800-160 MG PO TABS: 1.0000 | ORAL_TABLET | Freq: Two times a day (BID) | ORAL | 0 refills | Status: DC

## 2017-06-12 NOTE — Progress Notes (Signed)
   Subjective:    Patient ID: [name] [name], female    DOB: 1951/07/02, 66 y.o.   MRN: [id number]  Dysuria   This is a new problem. The current episode started 1 to 4 weeks ago. The problem occurs intermittently. The problem has been gradually worsening. The quality of the pain is described as burning and aching. The pain is moderate. There has been no fever. She is not sexually active. There is no history of pyelonephritis. Associated symptoms include frequency, nausea and urgency. Pertinent negatives include no chills, discharge, flank pain, hematuria, hesitancy or vomiting. She has tried nothing for the symptoms.    Klebsiella 11/2016 treated with macrobid : SE,  changed to different antibiotics.  Review of Systems  Constitutional: Negative for chills.  Gastrointestinal: Positive for nausea. Negative for vomiting.  Genitourinary: Positive for dysuria, frequency and urgency. Negative for flank pain, hematuria and hesitancy.       Objective:   Physical Exam  Constitutional: Vital signs are normal. She appears well-developed and well-nourished. She is cooperative.  Non-toxic appearance. She does not appear ill. No distress.  HENT:  Head: Normocephalic.  Right Ear: Hearing, tympanic membrane, external ear and ear canal normal. Tympanic membrane is not erythematous, not retracted and not bulging.  Left Ear: Hearing, tympanic membrane, external ear and ear canal normal. Tympanic membrane is not erythematous, not retracted and not bulging.  Nose: No mucosal edema or rhinorrhea. Right sinus exhibits no maxillary sinus tenderness and no frontal sinus tenderness. Left sinus exhibits no maxillary sinus tenderness and no frontal sinus tenderness.  Mouth/Throat: Uvula is midline, oropharynx is clear and moist and mucous membranes are normal.  Eyes: Pupils are equal, round, and reactive to light. Conjunctivae, EOM and lids are normal. Lids are everted and swept, no foreign bodies found.  Neck:  Trachea normal and normal range of motion. Neck supple. Carotid bruit is not present. No thyroid mass and no thyromegaly present.  Cardiovascular: Normal rate, regular rhythm, S1 normal, S2 normal, normal heart sounds, intact distal pulses and normal pulses.  Exam reveals no gallop and no friction rub.   No murmur heard. Pulmonary/Chest: Effort normal and breath sounds normal. No tachypnea. No respiratory distress. She has no decreased breath sounds. She has no wheezes. She has no rhonchi. She has no rales.  Abdominal: Soft. Normal appearance and bowel sounds are normal. There is no hepatosplenomegaly. There is tenderness in the suprapubic area. There is no rigidity, no rebound, no guarding and no CVA tenderness.  Neurological: She is alert.  Skin: Skin is warm, dry and intact. No rash noted.  Psychiatric: Her speech is normal and behavior is normal. Judgment and thought content normal. Her mood appears not anxious. Cognition and memory are normal. She does not exhibit a depressed mood.          Assessment & Plan:

## 2017-06-12 NOTE — Patient Instructions (Addendum)
Push fluids, complete course of antibiotics.  We will call with culture results.  Call if symptoms not resolved at completion of antibiotics.    Urinary Tract Infection, Adult A urinary tract infection (UTI) is an infection of any part of the urinary tract, which includes the kidneys, ureters, bladder, and urethra. These organs make, store, and get rid of urine in the body. UTI can be a bladder infection (cystitis) or kidney infection (pyelonephritis). What are the causes? This infection may be caused by fungi, viruses, or bacteria. Bacteria are the most common cause of UTIs. This condition can also be caused by repeated incomplete emptying of the bladder during urination. What increases the risk? This condition is more likely to develop if:  You ignore your need to urinate or hold urine for long periods of time.  You do not empty your bladder completely during urination.  You wipe back to front after urinating or having a bowel movement, if you are female.  You are uncircumcised, if you are female.  You are constipated.  You have a urinary catheter that stays in place (indwelling).  You have a weak defense (immune) system.  You have a medical condition that affects your bowels, kidneys, or bladder.  You have diabetes.  You take antibiotic medicines frequently or for long periods of time, and the antibiotics no longer work well against certain types of infections (antibiotic resistance).  You take medicines that irritate your urinary tract.  You are exposed to chemicals that irritate your urinary tract.  You are female.  What are the signs or symptoms? Symptoms of this condition include:  Fever.  Frequent urination or passing small amounts of urine frequently.  Needing to urinate urgently.  Pain or burning with urination.  Urine that smells bad or unusual.  Cloudy urine.  Pain in the lower abdomen or back.  Trouble urinating.  Blood in the urine.  Vomiting or  being less hungry than normal.  Diarrhea or abdominal pain.  Vaginal discharge, if you are female.  How is this diagnosed? This condition is diagnosed with a medical history and physical exam. You will also need to provide a urine sample to test your urine. Other tests may be done, including:  Blood tests.  Sexually transmitted disease (STD) testing.  If you have had more than one UTI, a cystoscopy or imaging studies may be done to determine the cause of the infections. How is this treated? Treatment for this condition often includes a combination of two or more of the following:  Antibiotic medicine.  Other medicines to treat less common causes of UTI.  Over-the-counter medicines to treat pain.  Drinking enough water to stay hydrated.  Follow these instructions at home:  Take over-the-counter and prescription medicines only as told by your health care provider.  If you were prescribed an antibiotic, take it as told by your health care provider. Do not stop taking the antibiotic even if you start to feel better.  Avoid alcohol, caffeine, tea, and carbonated beverages. They can irritate your bladder.  Drink enough fluid to keep your urine clear or pale yellow.  Keep all follow-up visits as told by your health care provider. This is important.  Make sure to: ? Empty your bladder often and completely. Do not hold urine for long periods of time. ? Empty your bladder before and after sex. ? Wipe from front to back after a bowel movement if you are female. Use each tissue one time when you wipe. Contact  a health care provider if:  You have back pain.  You have a fever.  You feel nauseous or vomit.  Your symptoms do not get better after 3 days.  Your symptoms go away and then return. Get help right away if:  You have severe back pain or lower abdominal pain.  You are vomiting and cannot keep down any medicines or water. This information is not intended to replace  advice given to you by your health care provider. Make sure you discuss any questions you have with your health care provider. Document Released: 07/03/2005 Document Revised: 03/06/2016 Document Reviewed: 08/14/2015 Elsevier Interactive Patient Education  2017 [employer].

## 2017-06-12 NOTE — Addendum Note (Signed)
Addended by: Carter Kitten on: 06/12/2017 09:53 AM   Modules accepted: Orders

## 2017-06-16 LAB — URINE CULTURE
MICRO NUMBER: 80979614
SPECIMEN QUALITY: ADEQUATE

## 2017-07-16 ENCOUNTER — Ambulatory Visit
Admission: RE | Admit: 2017-07-16 | Discharge: 2017-07-16 | Disposition: A | Discharge status: Home / Self Care | Payer: PPO | Source: Ambulatory Visit | Attending: Gynecology | Admitting: Gynecology

## 2017-07-16 DIAGNOSIS — Z1231 Encounter for screening mammogram for malignant neoplasm of breast: Secondary | ICD-10-CM | POA: Diagnosis not present

## 2017-07-23 ENCOUNTER — Ambulatory Visit (INDEPENDENT_AMBULATORY_CARE_PROVIDER_SITE_OTHER): Payer: PPO | Admitting: Family Medicine

## 2017-07-23 ENCOUNTER — Encounter: Payer: Self-pay | Admitting: Family Medicine

## 2017-07-23 VITALS — BP 120/68 | HR 73 | Temp 97.8°F | Wt 178.0 lb

## 2017-07-23 DIAGNOSIS — R519 Headache, unspecified: Secondary | ICD-10-CM | POA: Insufficient documentation

## 2017-07-23 DIAGNOSIS — R51 Headache: Secondary | ICD-10-CM | POA: Diagnosis not present

## 2017-07-23 LAB — CBC WITH DIFFERENTIAL/PLATELET
BASOS PCT: 0.4 % (ref 0.0–3.0)
Basophils Absolute: 0 10*3/uL (ref 0.0–0.1)
EOS PCT: 1.7 % (ref 0.0–5.0)
Eosinophils Absolute: 0.1 10*3/uL (ref 0.0–0.7)
HEMATOCRIT: 43.8 % (ref 36.0–46.0)
HEMOGLOBIN: 14.5 g/dL (ref 12.0–15.0)
Lymphocytes Relative: 33.8 % (ref 12.0–46.0)
Lymphs Abs: 1.6 10*3/uL (ref 0.7–4.0)
MCHC: 33.2 g/dL (ref 30.0–36.0)
MCV: 98 fl (ref 78.0–100.0)
MONOS PCT: 7.4 % (ref 3.0–12.0)
Monocytes Absolute: 0.4 10*3/uL (ref 0.1–1.0)
NEUTROS ABS: 2.7 10*3/uL (ref 1.4–7.7)
Neutrophils Relative %: 56.7 % (ref 43.0–77.0)
PLATELETS: 199 10*3/uL (ref 150.0–400.0)
RBC: 4.47 Mil/uL (ref 3.87–5.11)
RDW: 13.4 % (ref 11.5–15.5)
WBC: 4.8 10*3/uL (ref 4.0–10.5)

## 2017-07-23 LAB — C-REACTIVE PROTEIN: CRP: 0.3 mg/dL — ABNORMAL LOW (ref 0.5–20.0)

## 2017-07-23 LAB — SEDIMENTATION RATE: Sed Rate: 1 mm/hr (ref 0–30)

## 2017-07-23 NOTE — Patient Instructions (Signed)
Labs today to rule out artery inflammation (temporal arteritis)  Use ice on painful area  Ice or heat on your neck   We will make a plan based on results   Alert me if symptoms worsen in the meantime

## 2017-07-23 NOTE — Progress Notes (Signed)
Subjective:    Patient ID: [name] [name], female    DOB: 03-30-1951, 66 y.o.   MRN: [id number]  HPI  Here for headaches   Mostly on the L side  Ear and temple area are worse  occ 7-8/10 in intensity  1 month - becoming more constant (waxes and wanes) Perhaps vision L eye is a bit blurry  It will throb at times/worse with stress  Goes into the back of her neck / feels tight   A little congested-not bad   She put ice on her neck one day and it helped a bit   No nausea  No light sens   Fell twice in June-tripped   (wrong shoes/not lifting feet)  Did not hit her head   No stroke symptoms   BP Readings from Last 3 Encounters:  07/23/17 120/68  06/12/17 102/72  05/02/17 110/78   Pulse Readings from Last 3 Encounters:  07/23/17 73  06/12/17 84  05/02/17 68    She takes voltaren for arm pain  Some tylenol otc     Wt Readings from Last 3 Encounters:  07/23/17 178 lb (80.7 kg)  06/12/17 176 lb 4 oz (79.9 kg)  05/02/17 179 lb 8 oz (81.4 kg)   30.08 kg/m  Patient Active Problem List   Diagnosis Date Noted  . Left temporal headache 07/23/2017  . Urinary frequency 05/02/2017  . Palpitations 05/22/2015  . Pain in the chest   . RLQ abdominal pain 04/19/2015  . OA (osteoarthritis) of knee 11/21/2014  . Colon cancer screening 11/29/2013  . Routine general medical examination at a health care facility 09/16/2011  . DERMATITIS, ATOPIC 11/27/2010  . URTICARIA DUE TO COLD OR HEAT 11/27/2010  . STRESS REACTION, ACUTE, WITH EMOTIONAL DISTURBANCE 09/14/2010  . THROAT PAIN, CHRONIC 09/14/2010  . EXTERNAL HEMORRHOIDS WITHOUT MENTION COMP 06/25/2010  . ABNORMAL EKG 12/26/2009  . OTHER CHRONIC SINUSITIS 10/19/2009  . Vitamin D deficiency 04/21/2009  . ALLERGIC RHINITIS 11/09/2008  . GERD 07/28/2008  . Osteopenia 03/14/2008  . HYPERCHOLESTEROLEMIA, PURE 03/13/2007  . HIATAL HERNIA 01/12/2007   Past Medical History:  Diagnosis Date  . Acute meniscal tear of knee   .  Arthritis    "neck; left knee before replacement" (05/10/2015)  . Basal cell cancer 5/08  . Breast cyst    "left"  . Chest pain 9/11   [facility]) and palpitations - ruled out for MI with nl. Echo  . Complication of anesthesia HARD TO WAKE  . Difficulty sleeping   . Eczema   . GERD (gastroesophageal reflux disease)   . Heart palpitations OCCASIONAL--  TAKES ATENOLOL PRN  . Hiatal hernia   . HSV-2 infection    buttocks  . Hyperlipidemia DIET CONTROL  . Melanoma of skin ([place])   . MVP (mitral valve prolapse)    per pt no treatment needed  . Normal nuclear stress test 07-09-10  . Osteopenia 12/2013   T score -1.6 FRAX 16%/0.8%  . PONV (postoperative nausea and vomiting)    Past Surgical History:  Procedure Laterality Date  . Carotid US  9/05   Mild, recheck 1 year  . Carotid US  10/06   Normal  . CHONDROPLASTY  10/23/2011   Procedure: CHONDROPLASTY;  Surgeon: Gearlean Alf, MD;  Location: [facility];  Service: Orthopedics;  Laterality: Left;  Left medial   . COLONOSCOPY    . ESOPHAGOGASTRODUODENOSCOPY  9/07   Normal  . HYSTEROSCOPY WITH RESECTOSCOPE  2000  POLPECTOMY'S  . JOINT REPLACEMENT    . KNEE ARTHROSCOPY  10/23/2011   Procedure: ARTHROSCOPY KNEE;  Surgeon: Gearlean Alf, MD;  Location[facility];  Service: Orthopedics;  Laterality: Left;  LEFT KNEE SCOPE WITH DEBRIDEMENT   . MELANOMA EXCISION Right 1995   "side"  . MOHS SURGERY Left    "side of my nose"  . MOLE REMOVAL     "several; all over my body"  . MRI neck  3/00   C4-C5 herniation small stenosis mild C4-5, C5-6  . TONSILLECTOMY    . TOTAL KNEE ARTHROPLASTY Left 11/21/2014   Procedure: LEFT TOTAL KNEE ARTHROPLASTY;  Surgeon: Gearlean Alf, MD;  Location: [facility];  Service: Orthopedics;  Laterality: Left;  . TRANSTHORACIC ECHOCARDIOGRAM  06-29-2010   NORMAL LVSF, EF 55-60%,  MILD MR  . TUBAL LIGATION  1989  . VAGINAL HYSTERECTOMY  2001   complex hyperplasia with mother's  history of uterine cancer   Social History  Substance Use Topics  . Smoking status: Former Smoker    Packs/day: 1.00    Years: 15.00    Types: Cigarettes    Quit date: 10/07/1978  . Smokeless tobacco: Never Used  . Alcohol use No   Family History  Problem Relation Age of Onset  . Cancer Mother        uterine  . Obesity Mother   . Thrombocytopenia Mother   . Diabetes Mother   . Alcohol abuse Father   . Heart disease Father        CAD in 8's  . Emphysema Father   . Diabetes Maternal Grandmother        type 2  . Heart disease Paternal Aunt        CAD  . Heart disease Paternal Uncle        CAD  . Colon cancer Neg Hx    Allergies  Allergen Reactions  . Amoxicillin Rash  . Codeine Rash  . Macrobid [Nitrofurantoin]     GI issues  . Clarithromycin Other (See Comments)    REACTION: reaction not known   Current Outpatient Prescriptions on File Prior to Visit  Medication Sig Dispense Refill  . acyclovir (ZOVIRAX) 200 MG capsule TAKE 1 CAPSULE BY MOUTH DAILY AS NEEDED 60 capsule 3  . acyclovir cream (ZOVIRAX) 5 % Apply 1 application topically daily as needed. 5 g 3  . Ascorbic Acid (VITAMIN C PO) Take 1 tablet by mouth daily.    Marland Kitchen aspirin EC 81 MG tablet Take 1 tablet (81 mg total) by mouth daily.    Marland Kitchen atenolol (TENORMIN) 25 MG tablet Take 0.5 tablets (12.5 mg total) by mouth daily. 30 tablet 11  . CALCIUM CARBONATE PO Take 1 tablet by mouth daily.    . Cholecalciferol (VITAMIN D PO) Take 1 tablet by mouth daily.    Marland Kitchen CINNAMON PO Take 1 capsule by mouth. Takes occasionally    . CYANOCOBALAMIN PO Take 1 tablet by mouth daily.    . diclofenac (VOLTAREN) 50 MG EC tablet TAKE 1 TABLET BY MOUTH TWICE A DAY AS NEEDED WITH FOOD  0  . diclofenac sodium (VOLTAREN) 1 % GEL APPLY 4 GRAMS TO THE AFFECTED AREA 3 TIMES DAILY AS NEEDED  1  . KRILL OIL PO Take by mouth. Takes 1 tab occasionally    . magnesium gluconate (MAGONATE) 500 MG tablet Take 500 mg by mouth daily.    . Multiple  Vitamin (MULTIVITAMIN) capsule Take 1 capsule by mouth  daily.    . Polyethyl Glycol-Propyl Glycol (SYSTANE OP) Apply 1 drop to eye daily as needed (Dry eyes).    . traMADol (ULTRAM) 50 MG tablet Take 1 tablet (50 mg total) by mouth every 6 (six) hours as needed. 15 tablet 0   Current Facility-Administered Medications on File Prior to Visit  Medication Dose Route Frequency Provider Last Rate Last Dose  . gi cocktail (Maalox,Lidocaine,Donnatal)  30 mL Oral Once Briscoe Deutscher, DO        Review of Systems  Constitutional: Negative for activity change, appetite change, fatigue, fever and unexpected weight change.  HENT: Negative for congestion, ear pain, rhinorrhea, sinus pressure and sore throat.   Eyes: Positive for visual disturbance. Negative for pain and redness.  Respiratory: Negative for cough, shortness of breath and wheezing.   Cardiovascular: Negative for chest pain and palpitations.  Gastrointestinal: Negative for abdominal pain, blood in stool, constipation and diarrhea.  Endocrine: Negative for polydipsia and polyuria.  Genitourinary: Negative for dysuria, frequency and urgency.  Musculoskeletal: Positive for neck pain and neck stiffness. Negative for arthralgias, back pain and myalgias.  Skin: Negative for pallor and rash.  Allergic/Immunologic: Negative for environmental allergies.  Neurological: Positive for headaches. Negative for dizziness, tremors, seizures, syncope, facial asymmetry, speech difficulty, weakness, light-headedness and numbness.  Hematological: Negative for adenopathy. Does not bruise/bleed easily.  Psychiatric/Behavioral: Negative for decreased concentration and dysphoric mood. The patient is not nervous/anxious.        Objective:   Physical Exam  Constitutional: She is oriented to person, place, and time. She appears well-developed and well-nourished. No distress.  HENT:  Head: Normocephalic and atraumatic.  Right Ear: External ear normal.  Left Ear:  External ear normal.  Nose: Nose normal.  Mouth/Throat: Oropharynx is clear and moist. No oropharyngeal exudate.  No sinus tenderness Left  temporal tenderness -mild / w/o swelling or skin change No TMJ tenderness  Eyes: Pupils are equal, round, and reactive to light. Conjunctivae and EOM are normal. Right eye exhibits no discharge. Left eye exhibits no discharge. No scleral icterus.  No nystagmus  Neck: Normal range of motion and full passive range of motion without pain. Neck supple. No JVD present. Carotid bruit is not present. No tracheal deviation present. No thyromegaly present.  Cardiovascular: Normal rate, regular rhythm and normal heart sounds.   No murmur heard. Pulmonary/Chest: Effort normal and breath sounds normal. No respiratory distress. She has no wheezes. She has no rales.  Abdominal: Soft. Bowel sounds are normal. She exhibits no distension and no mass. There is no tenderness.  Musculoskeletal: She exhibits no edema or tenderness.  CS musculature is tight and tender-worse on the L  No bony tenderness No step off or change in rom  Lymphadenopathy:    She has no cervical adenopathy.  Neurological: She is alert and oriented to person, place, and time. She has normal strength and normal reflexes. She displays no atrophy and no tremor. No cranial nerve deficit or sensory deficit. She exhibits normal muscle tone. She displays a negative Romberg sign. Coordination and gait normal.  No focal cerebellar signs   Skin: Skin is warm and dry. No rash noted. No pallor.  Psychiatric: She has a normal mood and affect. Her behavior is normal. Thought content normal.          Assessment & Plan:   Problem List Items Addressed This Visit      Other   Left temporal headache    With tenderness of temple/ mild Nl  neuro exam  Check ESR and CRP to r/o temporal arteritis  Adv ice /heat  Disc poss of neck etiology - with tight muscles Offered muscle relaxer-pt states she does not  tolerate them  Consider PT if above nl       Relevant Orders   C-reactive protein (Completed)   Sedimentation rate (Completed)   CBC with Differential/Platelet (Completed)

## 2017-07-24 NOTE — Assessment & Plan Note (Signed)
With tenderness of temple/ mild Nl neuro exam  Check ESR and CRP to r/o temporal arteritis  Adv ice /heat  Disc poss of neck etiology - with tight muscles Offered muscle relaxer-pt states she does not tolerate them  Consider PT if above nl

## 2017-07-31 DIAGNOSIS — I788 Other diseases of capillaries: Secondary | ICD-10-CM | POA: Diagnosis not present

## 2017-07-31 DIAGNOSIS — L570 Actinic keratosis: Secondary | ICD-10-CM | POA: Diagnosis not present

## 2017-07-31 DIAGNOSIS — L821 Other seborrheic keratosis: Secondary | ICD-10-CM | POA: Diagnosis not present

## 2017-07-31 DIAGNOSIS — L814 Other melanin hyperpigmentation: Secondary | ICD-10-CM | POA: Diagnosis not present

## 2017-07-31 DIAGNOSIS — Z85828 Personal history of other malignant neoplasm of skin: Secondary | ICD-10-CM | POA: Diagnosis not present

## 2017-09-06 ENCOUNTER — Emergency Department (HOSPITAL_COMMUNITY): Payer: PPO

## 2017-09-06 ENCOUNTER — Emergency Department (HOSPITAL_COMMUNITY)
Admission: EM | Admit: 2017-09-06 | Discharge: 2017-09-06 | Disposition: A | Discharge status: Home / Self Care | Payer: PPO | Attending: Emergency Medicine | Admitting: Emergency Medicine

## 2017-09-06 ENCOUNTER — Encounter (HOSPITAL_COMMUNITY): Payer: Self-pay

## 2017-09-06 DIAGNOSIS — R55 Syncope and collapse: Secondary | ICD-10-CM | POA: Diagnosis not present

## 2017-09-06 DIAGNOSIS — Z7982 Long term (current) use of aspirin: Secondary | ICD-10-CM | POA: Diagnosis not present

## 2017-09-06 DIAGNOSIS — Z96652 Presence of left artificial knee joint: Secondary | ICD-10-CM | POA: Insufficient documentation

## 2017-09-06 DIAGNOSIS — Z87891 Personal history of nicotine dependence: Secondary | ICD-10-CM | POA: Diagnosis not present

## 2017-09-06 DIAGNOSIS — R51 Headache: Secondary | ICD-10-CM | POA: Diagnosis not present

## 2017-09-06 DIAGNOSIS — Z79899 Other long term (current) drug therapy: Secondary | ICD-10-CM | POA: Insufficient documentation

## 2017-09-06 DIAGNOSIS — R002 Palpitations: Secondary | ICD-10-CM | POA: Diagnosis not present

## 2017-09-06 DIAGNOSIS — R42 Dizziness and giddiness: Secondary | ICD-10-CM | POA: Insufficient documentation

## 2017-09-06 LAB — URINALYSIS, ROUTINE W REFLEX MICROSCOPIC
Bilirubin Urine: NEGATIVE
Glucose, UA: NEGATIVE mg/dL
Hgb urine dipstick: NEGATIVE
Ketones, ur: NEGATIVE mg/dL
LEUKOCYTES UA: NEGATIVE
NITRITE: NEGATIVE
Protein, ur: NEGATIVE mg/dL
SPECIFIC GRAVITY, URINE: 1.009 (ref 1.005–1.030)
pH: 8 (ref 5.0–8.0)

## 2017-09-06 LAB — CBC
HCT: 44.3 % (ref 36.0–46.0)
HEMOGLOBIN: 15.7 g/dL — AB (ref 12.0–15.0)
MCH: 33.5 pg (ref 26.0–34.0)
MCHC: 35.4 g/dL (ref 30.0–36.0)
MCV: 94.5 fL (ref 78.0–100.0)
Platelets: 211 10*3/uL (ref 150–400)
RBC: 4.69 MIL/uL (ref 3.87–5.11)
RDW: 13.1 % (ref 11.5–15.5)
WBC: 6 10*3/uL (ref 4.0–10.5)

## 2017-09-06 LAB — BASIC METABOLIC PANEL
ANION GAP: 11 (ref 5–15)
BUN: 18 mg/dL (ref 6–20)
CALCIUM: 9.9 mg/dL (ref 8.9–10.3)
CO2: 21 mmol/L — AB (ref 22–32)
Chloride: 105 mmol/L (ref 101–111)
Creatinine, Ser: 0.76 mg/dL (ref 0.44–1.00)
Glucose, Bld: 122 mg/dL — ABNORMAL HIGH (ref 65–99)
Potassium: 3.9 mmol/L (ref 3.5–5.1)
Sodium: 137 mmol/L (ref 135–145)

## 2017-09-06 LAB — CBG MONITORING, ED: GLUCOSE-CAPILLARY: 93 mg/dL (ref 65–99)

## 2017-09-06 NOTE — ED Notes (Signed)
Patient transported to CT 

## 2017-09-06 NOTE — ED Notes (Signed)
CBG 93 

## 2017-09-06 NOTE — Discharge Instructions (Addendum)
Your blood work and EKG today are unremarkable. Please follow up with your cardiologist as you may benefit from a holter monitor. You may also benefit from a referral to the neurologist.

## 2017-09-06 NOTE — ED Provider Notes (Signed)
[facility] EMERGENCY DEPARTMENT Provider Note   CSN: [id number] Arrival date & time: 09/06/17  1234     History   Chief Complaint Chief Complaint  Patient presents with  . Palpitations  . Near Syncope    HPI [name] [name] is a 66 y.o. female.  HPI 66 year old female with a past medical history of SVT, chronic palpitations, hyperlipidemia, GERD presenting with symptoms of near syncope and palpitations. She reports that she did not have a restful night and felt tired this morning.  She was walking to the bathroom she felt dizzy and lightheaded.  Then on the car ride to pressure sores she is feeling worse.  She felt like she was about to black out and felt that her heart was beating funny.  She also felt shaky and hot.  Her husband reports that she seemed short of breath at that time.  This episode lasted about 15-20 minutes.  She denies any chest pain.  She denies any recent illness and reports having normal p.o. intake.  Her husband reports that she drank a protein shake with caffeine which may contribute to her symptoms.  She reports that she has been having a left temporal headache for the past 5 weeks or so.  Her PCP has been evaluating her for this and her blood work returned normal.  She reports that her headache is associated with stress.  Her husband reports that she does have increased stress recently due to her granddaughter.  Reports taking anti-inflammatory medicine as needed but otherwise no other medications.  No focal weakness or other neurological deficits per history.  Past Medical History:  Diagnosis Date  . Acute meniscal tear of knee   . Arthritis    "neck; left knee before replacement" (05/10/2015)  . Basal cell cancer 5/08  . Breast cyst    "left"  . Chest pain 9/11   [facility]) and palpitations - ruled out for MI with nl. Echo  . Complication of anesthesia HARD TO WAKE  . Difficulty sleeping   . Eczema   . GERD (gastroesophageal reflux disease)    . Heart palpitations OCCASIONAL--  TAKES ATENOLOL PRN  . Hiatal hernia   . HSV-2 infection    buttocks  . Hyperlipidemia DIET CONTROL  . Melanoma of skin ([place])   . MVP (mitral valve prolapse)    per pt no treatment needed  . Normal nuclear stress test 07-09-10  . Osteopenia 12/2013   T score -1.6 FRAX 16%/0.8%  . PONV (postoperative nausea and vomiting)     Patient Active Problem List   Diagnosis Date Noted  . Left temporal headache 07/23/2017  . Urinary frequency 05/02/2017  . Palpitations 05/22/2015  . Pain in the chest   . RLQ abdominal pain 04/19/2015  . OA (osteoarthritis) of knee 11/21/2014  . Colon cancer screening 11/29/2013  . Routine general medical examination at a health care facility 09/16/2011  . DERMATITIS, ATOPIC 11/27/2010  . URTICARIA DUE TO COLD OR HEAT 11/27/2010  . STRESS REACTION, ACUTE, WITH EMOTIONAL DISTURBANCE 09/14/2010  . THROAT PAIN, CHRONIC 09/14/2010  . EXTERNAL HEMORRHOIDS WITHOUT MENTION COMP 06/25/2010  . ABNORMAL EKG 12/26/2009  . OTHER CHRONIC SINUSITIS 10/19/2009  . Vitamin D deficiency 04/21/2009  . ALLERGIC RHINITIS 11/09/2008  . GERD 07/28/2008  . Osteopenia 03/14/2008  . HYPERCHOLESTEROLEMIA, PURE 03/13/2007  . HIATAL HERNIA 01/12/2007    Past Surgical History:  Procedure Laterality Date  . Carotid US  9/05   Mild, recheck 1 year  .  Carotid US  10/06   Normal  . CHONDROPLASTY  10/23/2011   Procedure: CHONDROPLASTY;  Surgeon: Gearlean Alf, MD;  Location: [facility];  Service: Orthopedics;  Laterality: Left;  Left medial   . COLONOSCOPY    . ESOPHAGOGASTRODUODENOSCOPY  9/07   Normal  . HYSTEROSCOPY WITH RESECTOSCOPE  2000   POLPECTOMY'S  . JOINT REPLACEMENT    . KNEE ARTHROSCOPY  10/23/2011   Procedure: ARTHROSCOPY KNEE;  Surgeon: Gearlean Alf, MD;  Location: [facility];  Service: Orthopedics;  Laterality: Left;  LEFT KNEE SCOPE WITH DEBRIDEMENT   . MELANOMA EXCISION Right 1995    "side"  . MOHS SURGERY Left    "side of my nose"  . MOLE REMOVAL     "several; all over my body"  . MRI neck  3/00   C4-C5 herniation small stenosis mild C4-5, C5-6  . TONSILLECTOMY    . TOTAL KNEE ARTHROPLASTY Left 11/21/2014   Procedure: LEFT TOTAL KNEE ARTHROPLASTY;  Surgeon: Gearlean Alf, MD;  Location: [facility];  Service: Orthopedics;  Laterality: Left;  . TRANSTHORACIC ECHOCARDIOGRAM  06-29-2010   NORMAL LVSF, EF 55-60%,  MILD MR  . TUBAL LIGATION  1989  . VAGINAL HYSTERECTOMY  2001   complex hyperplasia with mother's history of uterine cancer    OB History    Gravida Para Term Preterm AB Living   3 3       3    SAB TAB Ectopic Multiple Live Births                   Home Medications    Prior to Admission medications   Medication Sig Start Date End Date Taking? Authorizing Provider  acyclovir (ZOVIRAX) 200 MG capsule TAKE 1 CAPSULE BY MOUTH DAILY AS NEEDED 07/22/16  Yes Tower, Wynelle Fanny, MD  acyclovir cream (ZOVIRAX) 5 % Apply 1 application topically daily as needed. 07/22/16  Yes Tower, Wynelle Fanny, MD  Ascorbic Acid (VITAMIN C PO) Take 1 tablet by mouth daily.   Yes [provider]  aspirin EC 81 MG tablet Take 1 tablet (81 mg total) by mouth daily. 05/11/15  Yes Donne Hazel, MD  atenolol (TENORMIN) 25 MG tablet Take 0.5 tablets (12.5 mg total) by mouth daily. Patient taking differently: Take 12.5 mg by mouth daily as needed (palpatations).  05/22/15  Yes Tower, Marne A, MD  CALCIUM CARBONATE PO Take 1 tablet by mouth daily.   Yes [provider]  Cholecalciferol (VITAMIN D PO) Take 1 tablet by mouth daily.   Yes [provider]  CINNAMON PO Take 1 capsule by mouth. Takes occasionally   Yes [provider]  CYANOCOBALAMIN PO Take 1 tablet by mouth daily.   Yes [provider]  diclofenac sodium (VOLTAREN) 1 % GEL APPLY 4 GRAMS TO THE AFFECTED AREA 3 TIMES DAILY AS NEEDED 04/21/17  Yes [provider]  KRILL OIL PO Take by  mouth. Takes 1 tab occasionally   Yes [provider]  magnesium gluconate (MAGONATE) 500 MG tablet Take 500 mg by mouth daily.   Yes [provider]  Multiple Vitamin (MULTIVITAMIN) capsule Take 1 capsule by mouth daily.   Yes [provider]  Polyethyl Glycol-Propyl Glycol (SYSTANE OP) Apply 1 drop to eye daily as needed (Dry eyes).   Yes [provider]  diclofenac (VOLTAREN) 50 MG EC tablet TAKE 1 TABLET BY MOUTH TWICE A DAY AS NEEDED WITH FOOD 04/23/17   [provider]  traMADol (ULTRAM) 50 MG tablet Take 1 tablet (50 mg total) by mouth every 6 (six) hours as needed. Patient not taking: Reported on 09/06/2017 05/02/17   Jinny Sanders, MD    Family History Family History  Problem Relation Age of Onset  . Cancer Mother        uterine  . Obesity Mother   . Thrombocytopenia Mother   . Diabetes Mother   . Alcohol abuse Father   . Heart disease Father        CAD in 35's  . Emphysema Father   . Diabetes Maternal Grandmother        type 2  . Heart disease Paternal Aunt        CAD  . Heart disease Paternal Uncle        CAD  . Colon cancer Neg Hx     Social History Social History   Tobacco Use  . Smoking status: Former Smoker    Packs/day: 1.00    Years: 15.00    Pack years: 15.00    Types: Cigarettes    Last attempt to quit: 10/07/1978    Years since quitting: 38.9  . Smokeless tobacco: Never Used  Substance Use Topics  . Alcohol use: No    Alcohol/week: 0.0 oz  . Drug use: No     Allergies   Amoxicillin; Codeine; Macrobid [nitrofurantoin]; and Clarithromycin   Review of Systems Review of Systems  Constitutional: Negative for activity change, appetite change, chills and fever.  HENT: Negative for rhinorrhea and sore throat.   Eyes: Positive for visual disturbance (During episode of dizziness). Negative for photophobia.  Respiratory: Positive for shortness of breath (During the episode.  Currently). Negative for chest  tightness.   Cardiovascular: Positive for palpitations ("Heart beating funny"). Negative for chest pain.  Gastrointestinal: Negative for abdominal pain, nausea and vomiting.  Neurological: Positive for dizziness, light-headedness and headaches. Negative for syncope, facial asymmetry, speech difficulty, weakness and numbness.  Psychiatric/Behavioral: Negative for confusion.    Physical Exam Updated Vital Signs BP 120/81 (BP Location: Right Arm)   Pulse 63   Resp 16   SpO2 99%   Physical Exam  Constitutional: She is oriented to person, place, and time. She appears well-developed and well-nourished. No distress.  HENT:  Head: Normocephalic.  Mouth/Throat: No oropharyngeal exudate.  Eyes: Conjunctivae and EOM are normal. Pupils are equal, round, and reactive to light. Right eye exhibits no discharge. Left eye exhibits no discharge.  Neck: Normal range of motion. Neck supple.  Cardiovascular: Normal rate, regular rhythm, normal heart sounds and intact distal pulses. Exam reveals no gallop and no friction rub.  No murmur heard. Pulmonary/Chest: Effort normal and breath sounds normal. No stridor. No respiratory distress. She has no wheezes. She has no rales.  Abdominal: Soft. Bowel sounds are normal. There is no tenderness.  Neurological: She is alert and oriented to person, place, and time. No cranial nerve deficit or sensory deficit. She exhibits normal muscle tone. Coordination normal.  Skin: Skin is warm and dry. Capillary refill takes less than 2 seconds. She is not diaphoretic.  Psychiatric: Her behavior is normal. Thought content normal.  Flattened affect     ED Treatments / Results  Labs (all labs ordered are listed, but only abnormal results are displayed) Labs Reviewed  BASIC METABOLIC PANEL - Abnormal; Notable for the following components:      Result Value   CO2 21 (*)    Glucose, Bld 122 (*)  All other components within normal limits  CBC - Abnormal; Notable for the  following components:   Hemoglobin 15.7 (*)    All other components within normal limits  URINALYSIS, ROUTINE W REFLEX MICROSCOPIC - Abnormal; Notable for the following components:   Color, Urine STRAW (*)    All other components within normal limits  CBG MONITORING, ED    EKG  EKG Interpretation  Date/Time:  Saturday September 06 2017 12:38:56 EST Ventricular Rate:  72 PR Interval:  162 QRS Duration: 74 QT Interval:  370 QTC Calculation: 405 R Axis:   91 Text Interpretation:   Suspect arm lead reversal, interpretation assumes no reversal Accelerated Junctional rhythm with occasional Premature ventricular complexes Rightward axis Abnormal ECG Artifact otherwise similar to previous Confirmed by Theotis Burrow [phone]) on 09/06/2017 1:19:51 PM       Radiology No results found.  Procedures Procedures (including critical care time) Orthostatic VS for the past 24 hrs (Last 3 readings):  BP- Lying Pulse- Lying BP- Sitting Pulse- Sitting BP- Standing at 0 minutes Pulse- Standing at 0 minutes  09/06/17 1502 120/81 64 130/81 69 142/88 72    Medications Ordered in ED Medications - No data to display   Initial Impression / Assessment and Plan / ED Course  I have reviewed the triage vital signs and the nursing notes.  Pertinent labs & imaging results that were available during my care of the patient were reviewed by me and considered in my medical decision making (see chart for details).  66 year old female with history of palpitations, SVT, presenting with episode of near syncope and palpitations.  EKG is unremarkable.  She is afebrile with normal vital signs.  Her neurological exam is normal.  Orthostatic blood pressure measurements are unremarkable.  CBC and BMP were unremarkable as well. Symptoms seem to be due to due stress/anixety/psychological in etiology. Discussed follow-up with cardiologist for possible Holter monitor for further evaluation of palpitations.  Follow-up with PCP  if symptoms persist.  Due to fairly new and persistent headache and a 65 year old female, will obtain CT head.   4:22PM: awaiting CT head final result. Signed out to Amie [place], [place]      Final Clinical Impressions(s) / ED Diagnoses   Final diagnoses:  Near syncope  Palpitations    ED Discharge Orders    None       Smiley Houseman, MD 09/06/17 1623    Little, Wenda Overland, MD 09/11/17 2126

## 2017-09-06 NOTE — ED Notes (Signed)
Pt ambulatory from restroom with steady gait

## 2017-09-06 NOTE — ED Notes (Signed)
Pt voided directly prior to being brought back to room;Pt now aware that a urine sample is needed

## 2017-09-06 NOTE — ED Provider Notes (Signed)
Care assumed from previous provider Resident physician [facility] and attending Dr. Rex Kras. Please see note for further details. Case discussed, plan agreed upon. Will follow up on pending head CT. If negative, discharge to home with cards follow up.   CT negative. Discharged home in satisfactory condition with follow up plan.    Meshia Rau, Ozella Almond, PA-C 09/06/17 [place], [place], MD 09/07/17 2255

## 2017-09-06 NOTE — ED Triage Notes (Signed)
Pt states she began feeling really dizzy. She reports feeling as though her heart was racing. Pt sates she feels as though she is going to pass out. Pt alert and oriented. Skin warm and dry. Pt very anxious in triage.

## 2017-09-12 ENCOUNTER — Encounter: Payer: Self-pay | Admitting: Family Medicine

## 2017-09-12 ENCOUNTER — Ambulatory Visit (INDEPENDENT_AMBULATORY_CARE_PROVIDER_SITE_OTHER): Payer: PPO | Admitting: Family Medicine

## 2017-09-12 ENCOUNTER — Ambulatory Visit: Payer: Self-pay | Admitting: *Deleted

## 2017-09-12 ENCOUNTER — Ambulatory Visit: Payer: PPO | Admitting: Physician Assistant

## 2017-09-12 ENCOUNTER — Ambulatory Visit: Payer: PPO | Admitting: Family Medicine

## 2017-09-12 VITALS — BP 94/60 | HR 62 | Temp 97.6°F | Ht 64.5 in | Wt 173.0 lb

## 2017-09-12 DIAGNOSIS — G479 Sleep disorder, unspecified: Secondary | ICD-10-CM | POA: Diagnosis not present

## 2017-09-12 HISTORY — DX: Sleep disorder, unspecified: G47.9

## 2017-09-12 MED ORDER — TRAZODONE HCL 50 MG PO TABS: 50.0000 mg | ORAL_TABLET | Freq: Every evening | ORAL | 3 refills | Status: DC | PRN

## 2017-09-12 NOTE — Progress Notes (Signed)
Subjective:    Patient ID: [name] [name], female    DOB: Feb 13, 1951, 66 y.o.   MRN: [id number]  HPI  Here for sleep problems   Wt Readings from Last 3 Encounters:  09/12/17 173 lb (78.5 kg)  07/23/17 178 lb (80.7 kg)  06/12/17 176 lb 4 oz (79.9 kg)   29.24 kg/m   Goes to bed 10-10:30  Used to get up from 7-8 but right now it is everywhere   Problems falling and staying asleep   If she does not take anything- cannot fall asleep (almost falls asleep and then she cannot- then she is up all night) Problems since starting menopause   Miserable 10 hours of sleep this week   She is weaning down caffeine (tea)  Today 1/2 cup of coffee   Sleep hygiene- watches tv in bed 20 min before sleep  She reads in rocking chair  When she cannot sleep-tries not to get up because she may wake up husband who still works  Things tried  Benadryl - worked for a while and then stopped working Then melatonin - no longer works  Has tried a combo of valarian and melatonin and L tryptophan  unisom sometimes helps  ambien long ago - made her feel bad    Mind sometimes races - gets anxiety because she cannot sleep  Has not listened to music or meditation recordings   She does not think she is depressed Perhaps has some anxiety (when she does not get any rest)  Lot of stuff going on for xmas - big family    Never been tried trazadone  Does not like muscle relaxers   Lab Results  Component Value Date   TSH 1.52 12/11/2016    Patient Active Problem List   Diagnosis Date Noted  . Sleep disorder 09/12/2017  . Left temporal headache 07/23/2017  . Urinary frequency 05/02/2017  . Palpitations 05/22/2015  . Pain in the chest   . RLQ abdominal pain 04/19/2015  . OA (osteoarthritis) of knee 11/21/2014  . Colon cancer screening 11/29/2013  . Routine general medical examination at a health care facility 09/16/2011  . DERMATITIS, ATOPIC 11/27/2010  . URTICARIA DUE TO COLD OR HEAT 11/27/2010    . STRESS REACTION, ACUTE, WITH EMOTIONAL DISTURBANCE 09/14/2010  . THROAT PAIN, CHRONIC 09/14/2010  . EXTERNAL HEMORRHOIDS WITHOUT MENTION COMP 06/25/2010  . ABNORMAL EKG 12/26/2009  . OTHER CHRONIC SINUSITIS 10/19/2009  . Vitamin D deficiency 04/21/2009  . ALLERGIC RHINITIS 11/09/2008  . GERD 07/28/2008  . Osteopenia 03/14/2008  . HYPERCHOLESTEROLEMIA, PURE 03/13/2007  . HIATAL HERNIA 01/12/2007   Past Medical History:  Diagnosis Date  . Acute meniscal tear of knee   . Arthritis    "neck; left knee before replacement" (05/10/2015)  . Basal cell cancer 5/08  . Breast cyst    "left"  . Chest pain 9/11   [facility]) and palpitations - ruled out for MI with nl. Echo  . Complication of anesthesia HARD TO WAKE  . Difficulty sleeping   . Eczema   . GERD (gastroesophageal reflux disease)   . Heart palpitations OCCASIONAL--  TAKES ATENOLOL PRN  . Hiatal hernia   . HSV-2 infection    buttocks  . Hyperlipidemia DIET CONTROL  . Melanoma of skin ([place])   . MVP (mitral valve prolapse)    per pt no treatment needed  . Normal nuclear stress test 07-09-10  . Osteopenia 12/2013   T score -1.6 FRAX 16%/0.8%  . PONV (  postoperative nausea and vomiting)    Past Surgical History:  Procedure Laterality Date  . Carotid US  9/05   Mild, recheck 1 year  . Carotid US  10/06   Normal  . CHONDROPLASTY  10/23/2011   Procedure: CHONDROPLASTY;  Surgeon: Gearlean Alf, MD;  Location: [facility];  Service: Orthopedics;  Laterality: Left;  Left medial   . COLONOSCOPY    . ESOPHAGOGASTRODUODENOSCOPY  9/07   Normal  . HYSTEROSCOPY WITH RESECTOSCOPE  2000   POLPECTOMY'S  . JOINT REPLACEMENT    . KNEE ARTHROSCOPY  10/23/2011   Procedure: ARTHROSCOPY KNEE;  Surgeon: Gearlean Alf, MD;  Location: [facility];  Service: Orthopedics;  Laterality: Left;  LEFT KNEE SCOPE WITH DEBRIDEMENT   . MELANOMA EXCISION Right 1995   "side"  . MOHS SURGERY Left    "side of my nose"  .  MOLE REMOVAL     "several; all over my body"  . MRI neck  3/00   C4-C5 herniation small stenosis mild C4-5, C5-6  . TONSILLECTOMY    . TOTAL KNEE ARTHROPLASTY Left 11/21/2014   Procedure: LEFT TOTAL KNEE ARTHROPLASTY;  Surgeon: Gearlean Alf, MD;  Location: [facility];  Service: Orthopedics;  Laterality: Left;  . TRANSTHORACIC ECHOCARDIOGRAM  06-29-2010   NORMAL LVSF, EF 55-60%,  MILD MR  . TUBAL LIGATION  1989  . VAGINAL HYSTERECTOMY  2001   complex hyperplasia with mother's history of uterine cancer   Social History   Tobacco Use  . Smoking status: Former Smoker    Packs/day: 1.00    Years: 15.00    Pack years: 15.00    Types: Cigarettes    Last attempt to quit: 10/07/1978    Years since quitting: 38.9  . Smokeless tobacco: Never Used  Substance Use Topics  . Alcohol use: No    Alcohol/week: 0.0 oz  . Drug use: No   Family History  Problem Relation Age of Onset  . Cancer Mother        uterine  . Obesity Mother   . Thrombocytopenia Mother   . Diabetes Mother   . Alcohol abuse Father   . Heart disease Father        CAD in 96's  . Emphysema Father   . Diabetes Maternal Grandmother        type 2  . Heart disease Paternal Aunt        CAD  . Heart disease Paternal Uncle        CAD  . Colon cancer Neg Hx    Allergies  Allergen Reactions  . Amoxicillin Rash  . Codeine Rash  . Macrobid [Nitrofurantoin]     GI issues  . Clarithromycin Other (See Comments)    REACTION: reaction not known   Current Outpatient Medications on File Prior to Visit  Medication Sig Dispense Refill  . acyclovir (ZOVIRAX) 200 MG capsule TAKE 1 CAPSULE BY MOUTH DAILY AS NEEDED 60 capsule 3  . acyclovir cream (ZOVIRAX) 5 % Apply 1 application topically daily as needed. 5 g 3  . Ascorbic Acid (VITAMIN C PO) Take 1 tablet by mouth daily.    Marland Kitchen aspirin EC 81 MG tablet Take 1 tablet (81 mg total) by mouth daily.    Marland Kitchen atenolol (TENORMIN) 25 MG tablet Take 0.5 tablets (12.5 mg total) by mouth daily.  (Patient taking differently: Take 12.5 mg by mouth daily as needed (palpatations). ) 30 tablet 11  . CALCIUM CARBONATE PO Take  1 tablet by mouth daily.    . Cholecalciferol (VITAMIN D PO) Take 1 tablet by mouth daily.    Marland Kitchen CINNAMON PO Take 1 capsule by mouth. Takes occasionally    . CYANOCOBALAMIN PO Take 1 tablet by mouth daily.    . diclofenac (VOLTAREN) 50 MG EC tablet TAKE 1 TABLET BY MOUTH TWICE A DAY AS NEEDED WITH FOOD  0  . diclofenac sodium (VOLTAREN) 1 % GEL APPLY 4 GRAMS TO THE AFFECTED AREA 3 TIMES DAILY AS NEEDED  1  . KRILL OIL PO Take by mouth. Takes 1 tab occasionally    . magnesium gluconate (MAGONATE) 500 MG tablet Take 500 mg by mouth daily.    . Multiple Vitamin (MULTIVITAMIN) capsule Take 1 capsule by mouth daily.    Vladimir Faster Glycol-Propyl Glycol (SYSTANE OP) Apply 1 drop to eye daily as needed (Dry eyes).    . traMADol (ULTRAM) 50 MG tablet Take 1 tablet (50 mg total) by mouth every 6 (six) hours as needed. 15 tablet 0   Current Facility-Administered Medications on File Prior to Visit  Medication Dose Route Frequency Provider Last Rate Last Dose  . gi cocktail (Maalox,Lidocaine,Donnatal)  30 mL Oral Once Briscoe Deutscher, DO        Review of Systems  Constitutional: Negative for activity change, appetite change, fatigue, fever and unexpected weight change.  HENT: Negative for congestion, ear pain, rhinorrhea, sinus pressure and sore throat.   Eyes: Negative for pain, redness and visual disturbance.  Respiratory: Negative for cough, shortness of breath and wheezing.   Cardiovascular: Negative for chest pain and palpitations.  Gastrointestinal: Negative for abdominal pain, blood in stool, constipation and diarrhea.  Endocrine: Negative for polydipsia and polyuria.  Genitourinary: Negative for dysuria, frequency and urgency.  Musculoskeletal: Positive for arthralgias. Negative for back pain and myalgias.  Skin: Negative for pallor and rash.  Allergic/Immunologic:  Negative for environmental allergies.  Neurological: Negative for dizziness, syncope and headaches.  Hematological: Negative for adenopathy. Does not bruise/bleed easily.  Psychiatric/Behavioral: Positive for sleep disturbance. Negative for confusion, decreased concentration and dysphoric mood. The patient is nervous/anxious.        Objective:   Physical Exam  Constitutional: She appears well-developed and well-nourished. No distress.  Well appearing   HENT:  Head: Normocephalic and atraumatic.  Mouth/Throat: Oropharynx is clear and moist.  Eyes: Conjunctivae and EOM are normal. Pupils are equal, round, and reactive to light.  Neck: Normal range of motion. Neck supple. No JVD present. Carotid bruit is not present. No thyromegaly present.  Cardiovascular: Normal rate, regular rhythm, normal heart sounds and intact distal pulses. Exam reveals no gallop.  Pulmonary/Chest: Effort normal and breath sounds normal. No respiratory distress. She has no wheezes. She has no rales.  No crackles  Abdominal: She exhibits no abdominal bruit.  Musculoskeletal: She exhibits no edema.  Lymphadenopathy:    She has no cervical adenopathy.  Neurological: She is alert. She has normal reflexes. She displays no tremor. No cranial nerve deficit. She exhibits normal muscle tone. Coordination normal.  Skin: Skin is warm and dry. No rash noted. No pallor.  Psychiatric: She has a normal mood and affect.  Frustrated and fatigued appearing           Assessment & Plan:   Problem List Items Addressed This Visit      Other   Sleep disorder    Long disc re: hx of sleep problems for years (initiating and staying asleep) This produces anxiety which worsens  problem Failed otc meds and ambien  Disc sleep hygiene in detail  Needs to have husband (who snores) move into another room to start  Disc caffeine avoidance and no screens at bedtime/ get up if cannot sleep and read  Trial of trazadone 50 mg at  bedtime Discussed expectations of this medication including time to effectiveness and mechanism of action, also poss of side effects (early and late)- including mental fuzziness, weight or appetite change, nausea and poss of worse dep or anxiety (even suicidal thoughts)  Pt voiced understanding and will stop med and update if this occurs   Will update  >25 minutes spent in face to face time with patient, >50% spent in counselling or coordination of care-including sleep hygiene and extensive hx / poss side eff of medication

## 2017-09-12 NOTE — Telephone Encounter (Signed)
   Answer Assessment - Initial Assessment Questions 1. DESCRIPTION: "Tell me about your sleeping problem."      Patient has tried benadryl, melatonin, was using Unisom, Alteril- not working 2. ONSET: "How long have you been having trouble sleeping?" (e.g., days, weeks, months)     Patient started having trouble sleeping after menopause 3. RECURRENT: "Have you had sleeping problems before?"  If yes: "What happened that time?" "What helped your sleeping problem go away in the past?"      Constant for years- patient has tried OTC medications for years- with some success 4. STRESS: "Is there anything in your life that is making you feel stressed or tense?"     Stress does make it worse- but patient thinks that she lays at night and just can't sleep 5. PAIN: "Do you have any pain that is keeping you awake?" (e.g., back pain, headache, abdominal pain)     no 6. CAFFEINE ABUSE: "Do you drink caffeinated beverages, and how much each day?" (e.g., coffee, tea, colas)     Patient cuts caffeine off by lunchtime/3pm at latest 7. SUBSTANCE ABUSE: "Do you use any illegal drugs or alcohol?"     no 8. OTHER SYMPTOMS: "Do you have any other symptoms?"  (e.g., difficulty breathing)     No- patient has noticed a reoccrence of palpitations due to stress    Patient is frustrated because she is not sleeping. Her insomnia has gotten so bad that she is desperate for help.  Protocols used: [id number]

## 2017-09-12 NOTE — Patient Instructions (Addendum)
Avoid caffeine entirely  Avoid green tea   Go to bed and get up at the same time every day  Have your husband sleep in the guest room for now  Keep room cool  Wear a sleep mask or keep it very dark  Do not watch tv in bed  When you cannot sleep -get up and read a paper book (keep light not too bright)  Try trazadone 50 mg each evening an hour before bed   If side effects or not effective let me know   Take care of yourself

## 2017-09-14 NOTE — Assessment & Plan Note (Signed)
Long disc re: hx of sleep problems for years (initiating and staying asleep) This produces anxiety which worsens problem Failed otc meds and ambien  Disc sleep hygiene in detail  Needs to have husband (who snores) move into another room to start  Disc caffeine avoidance and no screens at bedtime/ get up if cannot sleep and read  Trial of trazadone 50 mg at bedtime Discussed expectations of this medication including time to effectiveness and mechanism of action, also poss of side effects (early and late)- including mental fuzziness, weight or appetite change, nausea and poss of worse dep or anxiety (even suicidal thoughts)  Pt voiced understanding and will stop med and update if this occurs   Will update  >25 minutes spent in face to face time with patient, >50% spent in counselling or coordination of care-including sleep hygiene and extensive hx / poss side eff of medication

## 2017-09-24 ENCOUNTER — Ambulatory Visit (INDEPENDENT_AMBULATORY_CARE_PROVIDER_SITE_OTHER): Payer: PPO | Admitting: Family Medicine

## 2017-09-24 VITALS — BP 122/64 | HR 55 | Temp 97.9°F | Ht 64.5 in | Wt 176.0 lb

## 2017-09-24 DIAGNOSIS — R002 Palpitations: Secondary | ICD-10-CM

## 2017-09-24 DIAGNOSIS — G479 Sleep disorder, unspecified: Secondary | ICD-10-CM

## 2017-09-24 DIAGNOSIS — F419 Anxiety disorder, unspecified: Secondary | ICD-10-CM | POA: Insufficient documentation

## 2017-09-24 MED ORDER — ESCITALOPRAM OXALATE 10 MG PO TABS: 10.0000 mg | ORAL_TABLET | Freq: Every day | ORAL | 11 refills | Status: DC

## 2017-09-24 NOTE — Progress Notes (Signed)
Subjective:    Patient ID: [name] [name], female    DOB: Jul 13, 1951, 66 y.o.   MRN: [id number]  HPI Here for hospital f/u   Seen in ED 12/1 for h/o SVT with palpitations and near syncope Also ongoing HA  Presented with light headedness and feeling of rapid HR  Was at [employer] when it happened  Went to eat -did not help   Of note- mentioned inc stress lately   Nl exam on presentations EKG showed occ PVC but no arrhythmia at all  Labs were normal  CT scan done-no significant findings     DATA:  Headache for 2 weeks.  Near syncopal episode today.  EXAM: CT HEAD WITHOUT CONTRAST  TECHNIQUE: Contiguous axial images were obtained from the base of the skull through the vertex without intravenous contrast.  COMPARISON:  05/29/2007  FINDINGS: Brain: No evidence of acute infarction, hemorrhage, hydrocephalus, extra-axial collection, or mass lesion/mass effect. Mild diffuse cerebral atrophy.  Vascular:  No hyperdense vessel or other acute findings.  Skull: No evidence of fracture or other significant bone abnormality.  Sinuses/Orbits: No acute findings. Mucous retention cyst or polyp noted in the left maxillary sinus.  Other: None.  IMPRESSION: No acute intracranial abnormality.  Mild cerebral atrophy.   Electronically Signed   By: Earle Gell M.D.   On: 09/06/2017 16:25  Results for orders placed or performed during the hospital encounter of 23/53/61  Basic metabolic panel  Result Value Ref Range   Sodium 137 135 - 145 mmol/L   Potassium 3.9 3.5 - 5.1 mmol/L   Chloride 105 101 - 111 mmol/L   CO2 21 (L) 22 - 32 mmol/L   Glucose, Bld 122 (H) 65 - 99 mg/dL   BUN 18 6 - 20 mg/dL   Creatinine, Ser 0.76 0.44 - 1.00 mg/dL   Calcium 9.9 8.9 - 10.3 mg/dL   GFR calc non Af Amer >60 >60 mL/min   GFR calc Af Amer >60 >60 mL/min   Anion gap 11 5 - 15  CBC  Result Value Ref Range   WBC 6.0 4.0 - 10.5 K/uL   RBC 4.69 3.87 - 5.11 MIL/uL   Hemoglobin 15.7  (H) 12.0 - 15.0 g/dL   HCT 44.3 36.0 - 46.0 %   MCV 94.5 78.0 - 100.0 fL   MCH 33.5 26.0 - 34.0 pg   MCHC 35.4 30.0 - 36.0 g/dL   RDW 13.1 11.5 - 15.5 %   Platelets 211 150 - 400 K/uL  Urinalysis, Routine w reflex microscopic  Result Value Ref Range   Color, Urine STRAW (A) YELLOW   APPearance CLEAR CLEAR   Specific Gravity, Urine 1.009 1.005 - 1.030   pH 8.0 5.0 - 8.0   Glucose, UA NEGATIVE NEGATIVE mg/dL   Hgb urine dipstick NEGATIVE NEGATIVE   Bilirubin Urine NEGATIVE NEGATIVE   Ketones, ur NEGATIVE NEGATIVE mg/dL   Protein, ur NEGATIVE NEGATIVE mg/dL   Nitrite NEGATIVE NEGATIVE   Leukocytes, UA NEGATIVE NEGATIVE  CBG monitoring, ED  Result Value Ref Range   Glucose-Capillary 93 65 - 99 mg/dL     Tried trazadone for sleep  Helped at first but felt like it caused heart palpitations  Also benadryl and unisom   Caffeine - 1/2 caff coffee   Would rather not do monitor or cardiology w/u  No palpitations during the day  occ takes 1/2 atenolol   Stress- getting ready for xmas to have family over  Not severe overall  Mind  occ races at night -not every night  Feels anxious at night - occ feels like she is going to have a panic attack   Benadryl and unisom may be giving her side effects   BP Readings from Last 3 Encounters:  09/24/17 122/64  09/12/17 94/60  09/06/17 114/72   Headache is now gone  Takes atenolol prn- makes her sluggish   Patient Active Problem List   Diagnosis Date Noted  . Anxiety 09/24/2017  . Sleep disorder 09/12/2017  . Urinary frequency 05/02/2017  . Palpitations 05/22/2015  . Pain in the chest   . RLQ abdominal pain 04/19/2015  . OA (osteoarthritis) of knee 11/21/2014  . Colon cancer screening 11/29/2013  . Routine general medical examination at a health care facility 09/16/2011  . DERMATITIS, ATOPIC 11/27/2010  . URTICARIA DUE TO COLD OR HEAT 11/27/2010  . STRESS REACTION, ACUTE, WITH EMOTIONAL DISTURBANCE 09/14/2010  . THROAT PAIN,  CHRONIC 09/14/2010  . EXTERNAL HEMORRHOIDS WITHOUT MENTION COMP 06/25/2010  . ABNORMAL EKG 12/26/2009  . OTHER CHRONIC SINUSITIS 10/19/2009  . Vitamin D deficiency 04/21/2009  . ALLERGIC RHINITIS 11/09/2008  . GERD 07/28/2008  . Osteopenia 03/14/2008  . HYPERCHOLESTEROLEMIA, PURE 03/13/2007  . HIATAL HERNIA 01/12/2007   Past Medical History:  Diagnosis Date  . Acute meniscal tear of knee   . Arthritis    "neck; left knee before replacement" (05/10/2015)  . Basal cell cancer 5/08  . Breast cyst    "left"  . Chest pain 9/11   [facility]) and palpitations - ruled out for MI with nl. Echo  . Complication of anesthesia HARD TO WAKE  . Difficulty sleeping   . Eczema   . GERD (gastroesophageal reflux disease)   . Heart palpitations OCCASIONAL--  TAKES ATENOLOL PRN  . Hiatal hernia   . HSV-2 infection    buttocks  . Hyperlipidemia DIET CONTROL  . Melanoma of skin ([place])   . MVP (mitral valve prolapse)    per pt no treatment needed  . Normal nuclear stress test 07-09-10  . Osteopenia 12/2013   T score -1.6 FRAX 16%/0.8%  . PONV (postoperative nausea and vomiting)    Past Surgical History:  Procedure Laterality Date  . Carotid US  9/05   Mild, recheck 1 year  . Carotid US  10/06   Normal  . CHONDROPLASTY  10/23/2011   Procedure: CHONDROPLASTY;  Surgeon: Gearlean Alf, MD;  Location: [facility];  Service: Orthopedics;  Laterality: Left;  Left medial   . COLONOSCOPY    . ESOPHAGOGASTRODUODENOSCOPY  9/07   Normal  . HYSTEROSCOPY WITH RESECTOSCOPE  2000   POLPECTOMY'S  . JOINT REPLACEMENT    . KNEE ARTHROSCOPY  10/23/2011   Procedure: ARTHROSCOPY KNEE;  Surgeon: Gearlean Alf, MD;  Location: [facility];  Service: Orthopedics;  Laterality: Left;  LEFT KNEE SCOPE WITH DEBRIDEMENT   . MELANOMA EXCISION Right 1995   "side"  . MOHS SURGERY Left    "side of my nose"  . MOLE REMOVAL     "several; all over my body"  . MRI neck  3/00   C4-C5 herniation  small stenosis mild C4-5, C5-6  . TONSILLECTOMY    . TOTAL KNEE ARTHROPLASTY Left 11/21/2014   Procedure: LEFT TOTAL KNEE ARTHROPLASTY;  Surgeon: Gearlean Alf, MD;  Location: [facility];  Service: Orthopedics;  Laterality: Left;  . TRANSTHORACIC ECHOCARDIOGRAM  06-29-2010   NORMAL LVSF, EF 55-60%,  MILD MR  . TUBAL LIGATION  1989  .  VAGINAL HYSTERECTOMY  2001   complex hyperplasia with mother's history of uterine cancer   Social History   Tobacco Use  . Smoking status: Former Smoker    Packs/day: 1.00    Years: 15.00    Pack years: 15.00    Types: Cigarettes    Last attempt to quit: 10/07/1978    Years since quitting: 38.9  . Smokeless tobacco: Never Used  Substance Use Topics  . Alcohol use: No    Alcohol/week: 0.0 oz  . Drug use: No   Family History  Problem Relation Age of Onset  . Cancer Mother        uterine  . Obesity Mother   . Thrombocytopenia Mother   . Diabetes Mother   . Alcohol abuse Father   . Heart disease Father        CAD in 65's  . Emphysema Father   . Diabetes Maternal Grandmother        type 2  . Heart disease Paternal Aunt        CAD  . Heart disease Paternal Uncle        CAD  . Colon cancer Neg Hx    Allergies  Allergen Reactions  . Amoxicillin Rash  . Codeine Rash  . Macrobid [Nitrofurantoin]     GI issues  . Clarithromycin Other (See Comments)    REACTION: reaction not known  . Trazodone And Nefazodone Palpitations   Current Outpatient Medications on File Prior to Visit  Medication Sig Dispense Refill  . acyclovir (ZOVIRAX) 200 MG capsule TAKE 1 CAPSULE BY MOUTH DAILY AS NEEDED 60 capsule 3  . acyclovir cream (ZOVIRAX) 5 % Apply 1 application topically daily as needed. 5 g 3  . Ascorbic Acid (VITAMIN C PO) Take 1 tablet by mouth daily.    Marland Kitchen aspirin EC 81 MG tablet Take 1 tablet (81 mg total) by mouth daily.    Marland Kitchen atenolol (TENORMIN) 25 MG tablet Take 0.5 tablets (12.5 mg total) by mouth daily. (Patient taking differently: Take 12.5 mg by  mouth daily as needed (palpatations). ) 30 tablet 11  . CALCIUM CARBONATE PO Take 1 tablet by mouth daily.    . Cholecalciferol (VITAMIN D PO) Take 1 tablet by mouth daily.    Marland Kitchen CINNAMON PO Take 1 capsule by mouth. Takes occasionally    . CYANOCOBALAMIN PO Take 1 tablet by mouth daily.    . diclofenac (VOLTAREN) 50 MG EC tablet TAKE 1 TABLET BY MOUTH TWICE A DAY AS NEEDED WITH FOOD  0  . diclofenac sodium (VOLTAREN) 1 % GEL APPLY 4 GRAMS TO THE AFFECTED AREA 3 TIMES DAILY AS NEEDED  1  . KRILL OIL PO Take by mouth. Takes 1 tab occasionally    . magnesium gluconate (MAGONATE) 500 MG tablet Take 500 mg by mouth daily.    . Multiple Vitamin (MULTIVITAMIN) capsule Take 1 capsule by mouth daily.    Vladimir Faster Glycol-Propyl Glycol (SYSTANE OP) Apply 1 drop to eye daily as needed (Dry eyes).    . traMADol (ULTRAM) 50 MG tablet Take 1 tablet (50 mg total) by mouth every 6 (six) hours as needed. 15 tablet 0   Current Facility-Administered Medications on File Prior to Visit  Medication Dose Route Frequency Provider Last Rate Last Dose  . gi cocktail (Maalox,Lidocaine,Donnatal)  30 mL Oral Once Briscoe Deutscher, DO         Review of Systems  Constitutional: Positive for fatigue. Negative for activity change, appetite change, fever  and unexpected weight change.  HENT: Negative for congestion, ear pain, rhinorrhea, sinus pressure and sore throat.   Eyes: Negative for pain, redness and visual disturbance.  Respiratory: Negative for cough, shortness of breath and wheezing.   Cardiovascular: Positive for palpitations. Negative for chest pain and leg swelling.  Gastrointestinal: Negative for abdominal pain, blood in stool, constipation and diarrhea.  Endocrine: Negative for polydipsia and polyuria.  Genitourinary: Negative for dysuria, frequency and urgency.  Musculoskeletal: Negative for arthralgias, back pain and myalgias.  Skin: Negative for pallor and rash.  Allergic/Immunologic: Negative for  environmental allergies.  Neurological: Negative for dizziness, syncope and headaches.  Hematological: Negative for adenopathy. Does not bruise/bleed easily.  Psychiatric/Behavioral: Negative for agitation, confusion, decreased concentration and dysphoric mood. The patient is nervous/anxious.        Objective:   Physical Exam  Constitutional: She appears well-developed and well-nourished. No distress.  Well but fatigued appearing   HENT:  Head: Normocephalic and atraumatic.  Mouth/Throat: Oropharynx is clear and moist.  Eyes: Conjunctivae and EOM are normal. Pupils are equal, round, and reactive to light.  Neck: Normal range of motion. Neck supple. No JVD present. Carotid bruit is not present. No thyromegaly present.  Cardiovascular: Normal rate, regular rhythm, normal heart sounds and intact distal pulses. Exam reveals no gallop.  Pulmonary/Chest: Effort normal and breath sounds normal. No respiratory distress. She has no wheezes. She has no rales.  No crackles  Abdominal: Soft. Bowel sounds are normal. She exhibits no distension, no abdominal bruit and no mass. There is no tenderness.  Musculoskeletal: She exhibits no edema.  Lymphadenopathy:    She has no cervical adenopathy.  Neurological: She is alert. She has normal reflexes. She displays no tremor. No cranial nerve deficit. She exhibits normal muscle tone. Coordination normal.  Skin: Skin is warm and dry. No rash noted.  Psychiatric: Her speech is normal and behavior is normal. Thought content normal. Her mood appears anxious. Her affect is not blunt, not labile and not inappropriate. Thought content is not paranoid. Cognition and memory are normal. She does not exhibit a depressed mood. She expresses no homicidal and no suicidal ideation.  Pt talks candidly about symptoms Not tearful           Assessment & Plan:   Problem List Items Addressed This Visit      Other   Anxiety - Primary    Now with panic  symptoms Reviewed stressors/ coping techniques/symptoms/ support sources/ tx options and side effects in detail today  Affecting sleep and worsening palpitations  Will try lexapro 10 mg daily  Discussed expectations of SSRI medication including time to effectiveness and mechanism of action, also poss of side effects (early and late)- including mental fuzziness, weight or appetite change, nausea and poss of worse dep or anxiety (even suicidal thoughts)  Pt voiced understanding and will stop med and update if this occurs   F/u planned -will titrate if helpful  >25 minutes spent in face to face time with patient, >50% spent in counselling or coordination of care -incl rev of hosp records/disc of sleep hygiene and options for care       Relevant Medications   escitalopram (LEXAPRO) 10 MG tablet   Palpitations    S/p cardiology w/u in the past/pvc on ekg Reviewed hospital records, lab results and studies in detail   Suspect related to stress/anxiety and sleep problems  Disc imp of caffeine cessation  Will try lexapro for anx /sleep  Continue to monitor-pt  does not desire further cardiac w/u at this time       Sleep disorder    This continues Trazodone caused side effect of palpitations (worse)  Rev sleep hygeine Disc need for caffeine cessation  Disc exercise  Suspect anxiety plays a role in sleeplessness-will try lexapro for both  F/u planned

## 2017-09-24 NOTE — Patient Instructions (Addendum)
Try to wean off all caffeine   Eat regularly - get protein every time  Continue protein shakes also   You need 64 oz of fluids daily - mostly water   Take lexapro (for anxiety and panic attacks and sleep) 1 pill each evening  If you have any intolerable side effects - stop it is and let us know   Benadryl or unisom are still ok if you need them for sleep (if you do not get side effects)  Do not take the trazadone   Follow up in 4-6 weeks

## 2017-09-25 NOTE — Assessment & Plan Note (Signed)
Now with panic symptoms Reviewed stressors/ coping techniques/symptoms/ support sources/ tx options and side effects in detail today  Affecting sleep and worsening palpitations  Will try lexapro 10 mg daily  Discussed expectations of SSRI medication including time to effectiveness and mechanism of action, also poss of side effects (early and late)- including mental fuzziness, weight or appetite change, nausea and poss of worse dep or anxiety (even suicidal thoughts)  Pt voiced understanding and will stop med and update if this occurs   F/u planned -will titrate if helpful  >25 minutes spent in face to face time with patient, >50% spent in counselling or coordination of care -incl rev of hosp records/disc of sleep hygiene and options for care

## 2017-09-25 NOTE — Assessment & Plan Note (Signed)
This continues Trazodone caused side effect of palpitations (worse)  Rev sleep hygeine Disc need for caffeine cessation  Disc exercise  Suspect anxiety plays a role in sleeplessness-will try lexapro for both  F/u planned

## 2017-09-25 NOTE — Assessment & Plan Note (Signed)
S/p cardiology w/u in the past/pvc on ekg Reviewed hospital records, lab results and studies in detail   Suspect related to stress/anxiety and sleep problems  Disc imp of caffeine cessation  Will try lexapro for anx /sleep  Continue to monitor-pt does not desire further cardiac w/u at this time

## 2017-10-03 ENCOUNTER — Ambulatory Visit: Payer: Self-pay | Admitting: *Deleted

## 2017-10-03 NOTE — Telephone Encounter (Signed)
Pt called stating that she had taken her Lexapro, gone to sleep and got up to go the bathroom around 3am and noticed that he heart was racing and having some shortness of breath. She also had taken  Melatonin to help her rest.  She was unable to go back to sleep until around 6 but only briefly. No symptoms today with the fast heart beat or shortness of breathe. Just feels bad.  Will go to the ED if symptoms come back and persist. Will make an appointment to be assessed.  Reason for Disposition . [name] has URGENT medication question about med that PCP prescribed and triager unable to answer question  Answer Assessment - Initial Assessment Questions 1. SYMPTOMS: "Do you have any symptoms?"    Fast heart beat, shortness of breath 2. SEVERITY: If symptoms are present, ask "Are they mild, moderate or severe?"     Severe last night (3am)  Protocols used: MEDICATION QUESTION CALL-A-AH

## 2017-10-03 NOTE — Telephone Encounter (Signed)
Pt notified of Dr. Tower's comments and verbalized understanding  

## 2017-10-03 NOTE — Telephone Encounter (Signed)
I will see her tomorrow as planned in Saturday clinic  If she is certain it was the lexapro-hold it otherwise take it again  Hold the melatonin if she thinks it could be that  Thanks  If symptoms worsen today please alert [place]

## 2017-10-04 ENCOUNTER — Ambulatory Visit (INDEPENDENT_AMBULATORY_CARE_PROVIDER_SITE_OTHER): Payer: PPO | Admitting: Family Medicine

## 2017-10-04 ENCOUNTER — Encounter: Payer: Self-pay | Admitting: Family Medicine

## 2017-10-04 VITALS — BP 118/68 | HR 75 | Temp 98.2°F | Wt 176.0 lb

## 2017-10-04 DIAGNOSIS — R002 Palpitations: Secondary | ICD-10-CM

## 2017-10-04 DIAGNOSIS — F419 Anxiety disorder, unspecified: Secondary | ICD-10-CM | POA: Diagnosis not present

## 2017-10-04 NOTE — Patient Instructions (Signed)
Continue the lexapro   (it will take a while to work)  You can take 1/2 pill daily for the first 2-4 days and then try going back to a whole pill   Keep taking lexapro in the early evening  Try cutting the 25 mg benadryl in 1/2 for sleep instead of a whole   Keep me posted

## 2017-10-04 NOTE — Progress Notes (Signed)
Subjective:    Patient ID: [name] [name], female    DOB: 08/08/1951, 66 y.o.   MRN: [id number]  HPI Here for f/u of chronic health problems/mental health issue   Wt Readings from Last 3 Encounters:  10/04/17 176 lb (79.8 kg)  09/24/17 176 lb (79.8 kg)  09/12/17 173 lb (78.5 kg)   29.74 kg/m  Seen on 12/19 Treated for anxiety with lexapro   She had been taking medicine at 5-6 pm (that night she took it later)  Did make her a little fuzzy headed   Woke up at 3 am to go to the bathroom thurs and went back to bed  Laid down and felt like her heart was racing - had to get up and drink water  Felt sob for a moment or two  She did not take her pulse  Does not think the rhythm was irregular  It went away quickly -felt better and went back to bed  Also taking melatonin   BP Readings from Last 3 Encounters:  10/04/17 118/68  09/24/17 122/64  09/12/17 94/60   Pulse Readings from Last 3 Encounters:  10/04/17 75  09/24/17 (!) 55  09/12/17 62   She started taking the lexapro  Last night took 1/2 pill at 6 pm  Took benadryl as well   Patient Active Problem List   Diagnosis Date Noted  . Anxiety 09/24/2017  . Sleep disorder 09/12/2017  . Urinary frequency 05/02/2017  . Palpitations 05/22/2015  . Pain in the chest   . OA (osteoarthritis) of knee 11/21/2014  . Colon cancer screening 11/29/2013  . Routine general medical examination at a health care facility 09/16/2011  . DERMATITIS, ATOPIC 11/27/2010  . URTICARIA DUE TO COLD OR HEAT 11/27/2010  . STRESS REACTION, ACUTE, WITH EMOTIONAL DISTURBANCE 09/14/2010  . THROAT PAIN, CHRONIC 09/14/2010  . EXTERNAL HEMORRHOIDS WITHOUT MENTION COMP 06/25/2010  . ABNORMAL EKG 12/26/2009  . OTHER CHRONIC SINUSITIS 10/19/2009  . Vitamin D deficiency 04/21/2009  . ALLERGIC RHINITIS 11/09/2008  . GERD 07/28/2008  . Osteopenia 03/14/2008  . HYPERCHOLESTEROLEMIA, PURE 03/13/2007  . HIATAL HERNIA 01/12/2007   Past Medical History:    Diagnosis Date  . Acute meniscal tear of knee   . Arthritis    "neck; left knee before replacement" (05/10/2015)  . Basal cell cancer 5/08  . Breast cyst    "left"  . Chest pain 9/11   [facility]) and palpitations - ruled out for MI with nl. Echo  . Complication of anesthesia HARD TO WAKE  . Difficulty sleeping   . Eczema   . GERD (gastroesophageal reflux disease)   . Heart palpitations OCCASIONAL--  TAKES ATENOLOL PRN  . Hiatal hernia   . HSV-2 infection    buttocks  . Hyperlipidemia DIET CONTROL  . Melanoma of skin ([place])   . MVP (mitral valve prolapse)    per pt no treatment needed  . Normal nuclear stress test 07-09-10  . Osteopenia 12/2013   T score -1.6 FRAX 16%/0.8%  . PONV (postoperative nausea and vomiting)    Past Surgical History:  Procedure Laterality Date  . Carotid US  9/05   Mild, recheck 1 year  . Carotid US  10/06   Normal  . CHONDROPLASTY  10/23/2011   Procedure: CHONDROPLASTY;  Surgeon: Gearlean Alf, MD;  Location: [facility];  Service: Orthopedics;  Laterality: Left;  Left medial   . COLONOSCOPY    . ESOPHAGOGASTRODUODENOSCOPY  9/07   Normal  .  HYSTEROSCOPY WITH RESECTOSCOPE  2000   POLPECTOMY'S  . JOINT REPLACEMENT    . KNEE ARTHROSCOPY  10/23/2011   Procedure: ARTHROSCOPY KNEE;  Surgeon: Gearlean Alf, MD;  Location: [facility];  Service: Orthopedics;  Laterality: Left;  LEFT KNEE SCOPE WITH DEBRIDEMENT   . MELANOMA EXCISION Right 1995   "side"  . MOHS SURGERY Left    "side of my nose"  . MOLE REMOVAL     "several; all over my body"  . MRI neck  3/00   C4-C5 herniation small stenosis mild C4-5, C5-6  . TONSILLECTOMY    . TOTAL KNEE ARTHROPLASTY Left 11/21/2014   Procedure: LEFT TOTAL KNEE ARTHROPLASTY;  Surgeon: Gearlean Alf, MD;  Location: [facility];  Service: Orthopedics;  Laterality: Left;  . TRANSTHORACIC ECHOCARDIOGRAM  06-29-2010   NORMAL LVSF, EF 55-60%,  MILD MR  . TUBAL LIGATION  1989  . VAGINAL  HYSTERECTOMY  2001   complex hyperplasia with mother's history of uterine cancer   Social History   Tobacco Use  . Smoking status: Former Smoker    Packs/day: 1.00    Years: 15.00    Pack years: 15.00    Types: Cigarettes    Last attempt to quit: 10/07/1978    Years since quitting: 39.0  . Smokeless tobacco: Never Used  Substance Use Topics  . Alcohol use: No    Alcohol/week: 0.0 oz  . Drug use: No   Family History  Problem Relation Age of Onset  . Cancer Mother        uterine  . Obesity Mother   . Thrombocytopenia Mother   . Diabetes Mother   . Alcohol abuse Father   . Heart disease Father        CAD in 80's  . Emphysema Father   . Diabetes Maternal Grandmother        type 2  . Heart disease Paternal Aunt        CAD  . Heart disease Paternal Uncle        CAD  . Colon cancer Neg Hx    Allergies  Allergen Reactions  . Amoxicillin Rash  . Codeine Rash  . Macrobid [Nitrofurantoin]     GI issues  . Clarithromycin Other (See Comments)    REACTION: reaction not known  . Trazodone And Nefazodone Palpitations   Current Outpatient Medications on File Prior to Visit  Medication Sig Dispense Refill  . acyclovir (ZOVIRAX) 200 MG capsule TAKE 1 CAPSULE BY MOUTH DAILY AS NEEDED 60 capsule 3  . acyclovir cream (ZOVIRAX) 5 % Apply 1 application topically daily as needed. 5 g 3  . Ascorbic Acid (VITAMIN C PO) Take 1 tablet by mouth daily.    Marland Kitchen aspirin EC 81 MG tablet Take 1 tablet (81 mg total) by mouth daily.    Marland Kitchen atenolol (TENORMIN) 25 MG tablet Take 0.5 tablets (12.5 mg total) by mouth daily. (Patient taking differently: Take 12.5 mg by mouth daily as needed (palpatations). ) 30 tablet 11  . CALCIUM CARBONATE PO Take 1 tablet by mouth daily.    . Cholecalciferol (VITAMIN D PO) Take 1 tablet by mouth daily.    Marland Kitchen CINNAMON PO Take 1 capsule by mouth. Takes occasionally    . CYANOCOBALAMIN PO Take 1 tablet by mouth daily.    . diclofenac (VOLTAREN) 50 MG EC tablet TAKE 1 TABLET  BY MOUTH TWICE A DAY AS NEEDED WITH FOOD  0  . diclofenac sodium (VOLTAREN) 1 %  GEL APPLY 4 GRAMS TO THE AFFECTED AREA 3 TIMES DAILY AS NEEDED  1  . escitalopram (LEXAPRO) 10 MG tablet Take 1 tablet (10 mg total) by mouth daily. 30 tablet 11  . KRILL OIL PO Take by mouth. Takes 1 tab occasionally    . magnesium gluconate (MAGONATE) 500 MG tablet Take 500 mg by mouth daily.    . Multiple Vitamin (MULTIVITAMIN) capsule Take 1 capsule by mouth daily.    Vladimir Faster Glycol-Propyl Glycol (SYSTANE OP) Apply 1 drop to eye daily as needed (Dry eyes).    . traMADol (ULTRAM) 50 MG tablet Take 1 tablet (50 mg total) by mouth every 6 (six) hours as needed. 15 tablet 0   Current Facility-Administered Medications on File Prior to Visit  Medication Dose Route Frequency Provider Last Rate Last Dose  . gi cocktail (Maalox,Lidocaine,Donnatal)  30 mL Oral Once Briscoe Deutscher, DO         Review of Systems  Constitutional: Positive for fatigue. Negative for activity change, appetite change, fever and unexpected weight change.  HENT: Negative for congestion, ear pain, rhinorrhea, sinus pressure and sore throat.   Eyes: Negative for pain, redness and visual disturbance.  Respiratory: Negative for cough, shortness of breath and wheezing.   Cardiovascular: Positive for palpitations. Negative for chest pain and leg swelling.  Gastrointestinal: Negative for abdominal pain, blood in stool, constipation and diarrhea.  Endocrine: Negative for polydipsia and polyuria.  Genitourinary: Negative for dysuria, frequency and urgency.  Musculoskeletal: Negative for arthralgias, back pain and myalgias.  Skin: Negative for pallor and rash.  Allergic/Immunologic: Negative for environmental allergies.  Neurological: Negative for dizziness, syncope and headaches.  Hematological: Negative for adenopathy. Does not bruise/bleed easily.  Psychiatric/Behavioral: Positive for sleep disturbance. Negative for decreased concentration,  dysphoric mood and suicidal ideas. The patient is nervous/anxious.        Objective:   Physical Exam  Constitutional: She appears well-developed and well-nourished. No distress.  Well appearing   HENT:  Head: Normocephalic and atraumatic.  Mouth/Throat: Oropharynx is clear and moist.  Eyes: Conjunctivae and EOM are normal. Pupils are equal, round, and reactive to light.  Neck: Normal range of motion. Neck supple. No JVD present. Carotid bruit is not present. No thyromegaly present.  Cardiovascular: Normal rate, regular rhythm, normal heart sounds and intact distal pulses. Exam reveals no gallop.  Pulmonary/Chest: Effort normal and breath sounds normal. No respiratory distress. She has no wheezes. She has no rales.  No crackles  Abdominal: Soft. Bowel sounds are normal. She exhibits no distension, no abdominal bruit and no mass. There is no tenderness.  Musculoskeletal: She exhibits no edema.  Lymphadenopathy:    She has no cervical adenopathy.  Neurological: She is alert. She has normal reflexes.  Skin: Skin is warm and dry. No rash noted.  Psychiatric: Her speech is normal and behavior is normal. Thought content normal. Her mood appears anxious. Her affect is not blunt, not labile and not inappropriate. She does not exhibit a depressed mood.  Anxious   Talks candidly about symptoms  Denies stressors currently but is worried about her anxiety           Assessment & Plan:   Problem List Items Addressed This Visit      Other   Anxiety - Primary    Suspect pt had a brief panic attack thurs  Better now -nl exam  She worries about side eff to lexapro  Will take it earlier in the day  Take 1/2  pill for 2-3 d before resuming 10 mg  Antihistamine prn for sleep -may try cutting dose in 1/2   Update if no imp or if palpitation symptoms return      Palpitations    One episode of perceived fast HR -but unsure if it was actually  Assoc with anx/sob- very brief  Suspect panic  attack in retrospect  Doubt medication side eff given timing and brief episode but need to watch  Offered to get her f/u with cardiology if needed  (has had w/u in the past)  Nl exam and vitals today

## 2017-10-05 NOTE — Assessment & Plan Note (Signed)
Suspect pt had a brief panic attack thurs  Better now -nl exam  She worries about side eff to lexapro  Will take it earlier in the day  Take 1/2 pill for 2-3 d before resuming 10 mg  Antihistamine prn for sleep -may try cutting dose in 1/2   Update if no imp or if palpitation symptoms return

## 2017-10-05 NOTE — Assessment & Plan Note (Signed)
One episode of perceived fast HR -but unsure if it was actually  Assoc with anx/sob- very brief  Suspect panic attack in retrospect  Doubt medication side eff given timing and brief episode but need to watch  Offered to get her f/u with cardiology if needed  (has had w/u in the past)  Nl exam and vitals today

## 2017-10-28 ENCOUNTER — Encounter: Payer: Self-pay | Admitting: Family Medicine

## 2017-10-28 ENCOUNTER — Ambulatory Visit (INDEPENDENT_AMBULATORY_CARE_PROVIDER_SITE_OTHER): Payer: PPO | Admitting: Family Medicine

## 2017-10-28 VITALS — BP 122/64 | HR 67 | Temp 98.1°F | Ht 64.5 in | Wt 176.8 lb

## 2017-10-28 DIAGNOSIS — R002 Palpitations: Secondary | ICD-10-CM

## 2017-10-28 DIAGNOSIS — F419 Anxiety disorder, unspecified: Secondary | ICD-10-CM | POA: Diagnosis not present

## 2017-10-28 DIAGNOSIS — G479 Sleep disorder, unspecified: Secondary | ICD-10-CM | POA: Diagnosis not present

## 2017-10-28 MED ORDER — ATENOLOL 25 MG PO TABS: 12.5000 mg | ORAL_TABLET | Freq: Every day | ORAL | 5 refills | Status: DC | PRN

## 2017-10-28 NOTE — Progress Notes (Signed)
Subjective:    Patient ID: [name] [name], female    DOB: July 23, 1951, 67 y.o.   MRN: [id number]  HPI  Here for f/u of chronic medical problems    Wt Readings from Last 3 Encounters:  10/28/17 176 lb 12 oz (80.2 kg)  10/04/17 176 lb (79.8 kg)  09/24/17 176 lb (79.8 kg)   29.87 kg/m   Last visit she was seen for anxiety with panic attacks as well as sleep disorder  She was worried about side eff of lexapro last time and was adv to try taking it earlier in the day and start with 1/2 pill before inc to 10 mg   She felt like it was sedating it a bit too much / making her too relaxed   Does feel less anxious when she takes 1 pill daily  May consider going back to 1 pill each pm    Was trying antihistamine for sleep as well (benadryl) - went back to a whole pill  Failed trazadone (side eff of palpitation)  She has cut back on caffeine and that helps She is sleeping better     Pulse Readings from Last 3 Encounters:  10/28/17 67  10/04/17 75  09/24/17 (!) 55   BP Readings from Last 3 Encounters:  10/28/17 122/64  10/04/17 118/68  09/24/17 122/64   Patient Active Problem List   Diagnosis Date Noted  . Anxiety 09/24/2017  . Sleep disorder 09/12/2017  . Urinary frequency 05/02/2017  . Palpitations 05/22/2015  . OA (osteoarthritis) of knee 11/21/2014  . Colon cancer screening 11/29/2013  . Routine general medical examination at a health care facility 09/16/2011  . DERMATITIS, ATOPIC 11/27/2010  . URTICARIA DUE TO COLD OR HEAT 11/27/2010  . STRESS REACTION, ACUTE, WITH EMOTIONAL DISTURBANCE 09/14/2010  . THROAT PAIN, CHRONIC 09/14/2010  . EXTERNAL HEMORRHOIDS WITHOUT MENTION COMP 06/25/2010  . ABNORMAL EKG 12/26/2009  . OTHER CHRONIC SINUSITIS 10/19/2009  . Vitamin D deficiency 04/21/2009  . ALLERGIC RHINITIS 11/09/2008  . GERD 07/28/2008  . Osteopenia 03/14/2008  . HYPERCHOLESTEROLEMIA, PURE 03/13/2007  . HIATAL HERNIA 01/12/2007   Past Medical History:    Diagnosis Date  . Acute meniscal tear of knee   . Arthritis    "neck; left knee before replacement" (05/10/2015)  . Basal cell cancer 5/08  . Breast cyst    "left"  . Chest pain 9/11   [facility]) and palpitations - ruled out for MI with nl. Echo  . Complication of anesthesia HARD TO WAKE  . Difficulty sleeping   . Eczema   . GERD (gastroesophageal reflux disease)   . Heart palpitations OCCASIONAL--  TAKES ATENOLOL PRN  . Hiatal hernia   . HSV-2 infection    buttocks  . Hyperlipidemia DIET CONTROL  . Melanoma of skin ([place])   . MVP (mitral valve prolapse)    per pt no treatment needed  . Normal nuclear stress test 07-09-10  . Osteopenia 12/2013   T score -1.6 FRAX 16%/0.8%  . PONV (postoperative nausea and vomiting)    Past Surgical History:  Procedure Laterality Date  . Carotid US  9/05   Mild, recheck 1 year  . Carotid US  10/06   Normal  . CHONDROPLASTY  10/23/2011   Procedure: CHONDROPLASTY;  Surgeon: Gearlean Alf, MD;  Location: [facility];  Service: Orthopedics;  Laterality: Left;  Left medial   . COLONOSCOPY    . ESOPHAGOGASTRODUODENOSCOPY  9/07   Normal  . HYSTEROSCOPY WITH RESECTOSCOPE  [place] ARTHROSCOPY  10/23/2011   Procedure: ARTHROSCOPY KNEE;  Surgeon: Gearlean Alf, MD;  Location: [facility];  Service: Orthopedics;  Laterality: Left;  LEFT KNEE SCOPE WITH DEBRIDEMENT   . MELANOMA EXCISION Right 1995   "side"  . MOHS SURGERY Left    "side of my nose"  . MOLE REMOVAL     "several; all over my body"  . MRI neck  3/00   C4-C5 herniation small stenosis mild C4-5, C5-6  . TONSILLECTOMY    . TOTAL KNEE ARTHROPLASTY Left 11/21/2014   Procedure: LEFT TOTAL KNEE ARTHROPLASTY;  Surgeon: Gearlean Alf, MD;  Location: [facility];  Service: Orthopedics;  Laterality: Left;  . TRANSTHORACIC ECHOCARDIOGRAM  06-29-2010   NORMAL LVSF, EF 55-60%,  MILD MR  . TUBAL LIGATION  1989  . VAGINAL  HYSTERECTOMY  2001   complex hyperplasia with mother's history of uterine cancer   Social History   Tobacco Use  . Smoking status: Former Smoker    Packs/day: 1.00    Years: 15.00    Pack years: 15.00    Types: Cigarettes    Last attempt to quit: 10/07/1978    Years since quitting: 39.0  . Smokeless tobacco: Never Used  Substance Use Topics  . Alcohol use: No    Alcohol/week: 0.0 oz  . Drug use: No   Family History  Problem Relation Age of Onset  . Cancer Mother        uterine  . Obesity Mother   . Thrombocytopenia Mother   . Diabetes Mother   . Alcohol abuse Father   . Heart disease Father        CAD in 45's  . Emphysema Father   . Diabetes Maternal Grandmother        type 2  . Heart disease Paternal Aunt        CAD  . Heart disease Paternal Uncle        CAD  . Colon cancer Neg Hx    Allergies  Allergen Reactions  . Amoxicillin Rash  . Codeine Rash  . Macrobid [Nitrofurantoin]     GI issues  . Clarithromycin Other (See Comments)    REACTION: reaction not known  . Trazodone And Nefazodone Palpitations   Current Outpatient Medications on File Prior to Visit  Medication Sig Dispense Refill  . acyclovir (ZOVIRAX) 200 MG capsule TAKE 1 CAPSULE BY MOUTH DAILY AS NEEDED 60 capsule 3  . acyclovir cream (ZOVIRAX) 5 % Apply 1 application topically daily as needed. 5 g 3  . Ascorbic Acid (VITAMIN C PO) Take 1 tablet by mouth daily.    Marland Kitchen aspirin EC 81 MG tablet Take 1 tablet (81 mg total) by mouth daily.    Marland Kitchen CALCIUM CARBONATE PO Take 1 tablet by mouth daily.    . Cholecalciferol (VITAMIN D PO) Take 1 tablet by mouth daily.    Marland Kitchen CINNAMON PO Take 1 capsule by mouth. Takes occasionally    . CYANOCOBALAMIN PO Take 1 tablet by mouth daily.    . diclofenac (VOLTAREN) 50 MG EC tablet TAKE 1 TABLET BY MOUTH TWICE A DAY AS NEEDED WITH FOOD  0  . diclofenac sodium (VOLTAREN) 1 % GEL APPLY 4 GRAMS TO THE AFFECTED AREA 3 TIMES DAILY AS NEEDED  1  . escitalopram (LEXAPRO) 10 MG  tablet Take 1 tablet (10 mg total) by mouth daily. 30 tablet 11  .  KRILL OIL PO Take by mouth. Takes 1 tab occasionally    . magnesium gluconate (MAGONATE) 500 MG tablet Take 500 mg by mouth daily.    . Multiple Vitamin (MULTIVITAMIN) capsule Take 1 capsule by mouth daily.    Vladimir Faster Glycol-Propyl Glycol (SYSTANE OP) Apply 1 drop to eye daily as needed (Dry eyes).     Current Facility-Administered Medications on File Prior to Visit  Medication Dose Route Frequency Provider Last Rate Last Dose  . gi cocktail (Maalox,Lidocaine,Donnatal)  30 mL Oral Once Briscoe Deutscher, DO        Review of Systems  Constitutional: Negative for activity change, appetite change, fatigue, fever and unexpected weight change.  HENT: Negative for congestion, ear pain, rhinorrhea, sinus pressure and sore throat.   Eyes: Negative for pain, redness and visual disturbance.  Respiratory: Negative for cough, shortness of breath and wheezing.   Cardiovascular: Negative for chest pain and palpitations.  Gastrointestinal: Negative for abdominal pain, blood in stool, constipation and diarrhea.  Endocrine: Negative for polydipsia and polyuria.  Genitourinary: Negative for dysuria, frequency and urgency.  Musculoskeletal: Negative for arthralgias, back pain and myalgias.  Skin: Negative for pallor and rash.  Allergic/Immunologic: Negative for environmental allergies.  Neurological: Negative for dizziness, syncope and headaches.  Hematological: Negative for adenopathy. Does not bruise/bleed easily.  Psychiatric/Behavioral: Positive for sleep disturbance. Negative for decreased concentration, dysphoric mood and suicidal ideas. The patient is nervous/anxious.        Objective:   Physical Exam  Constitutional: She appears well-developed and well-nourished. No distress.  overwt and well app  HENT:  Head: Normocephalic and atraumatic.  Eyes: Conjunctivae and EOM are normal. Pupils are equal, round, and reactive to light.  No scleral icterus.  Neck: Normal range of motion. Neck supple.  Cardiovascular: Normal rate, regular rhythm and normal heart sounds.  Pulmonary/Chest: Effort normal and breath sounds normal. No respiratory distress. She has no wheezes.  Lymphadenopathy:    She has no cervical adenopathy.  Neurological: She is alert. She displays no tremor. No cranial nerve deficit. She exhibits normal muscle tone. Coordination normal.  Skin: Skin is warm and dry. No rash noted. No erythema. No pallor.  Psychiatric: She has a normal mood and affect. Her speech is normal and behavior is normal. Thought content normal. Her mood appears not anxious. Her affect is not blunt and not labile. She does not exhibit a depressed mood.  Less anxious today  Talkative with lighter mood  Good insight           Assessment & Plan:   Problem List Items Addressed This Visit      Other   Anxiety - Primary    The lexapro is helping-now making her more relaxed/sleepy and she thinks she can try taking at bedtime again  Disc sleep hygiene and good exercise and self care habits  Reviewed stressors/ coping techniques/symptoms/ support sources/ tx options and side effects in detail today       Palpitations    None since last visit  Continue to watch  Refilled atenolol to have on hand when needed      Sleep disorder    Some improvement  Continue good sleep hygiene  Will try taking lexapro 10 mg later again  Antihistamine prn as needed

## 2017-10-28 NOTE — Patient Instructions (Addendum)
I'm glad you are doing better  Try taking the lexapro 1 pill at bedtime   Its ok to take the benadryl with it if you need to   See how you do   Try to take care of yourself  Avoid caffeine  Continue the stationary bike

## 2017-10-29 NOTE — Assessment & Plan Note (Signed)
The lexapro is helping-now making her more relaxed/sleepy and she thinks she can try taking at bedtime again  Disc sleep hygiene and good exercise and self care habits  Reviewed stressors/ coping techniques/symptoms/ support sources/ tx options and side effects in detail today

## 2017-10-29 NOTE — Assessment & Plan Note (Signed)
None since last visit  Continue to watch  Refilled atenolol to have on hand when needed

## 2017-10-29 NOTE — Assessment & Plan Note (Signed)
Some improvement  Continue good sleep hygiene  Will try taking lexapro 10 mg later again  Antihistamine prn as needed

## 2017-11-27 DIAGNOSIS — L820 Inflamed seborrheic keratosis: Secondary | ICD-10-CM | POA: Diagnosis not present

## 2017-11-27 DIAGNOSIS — Z85828 Personal history of other malignant neoplasm of skin: Secondary | ICD-10-CM | POA: Diagnosis not present

## 2017-11-27 DIAGNOSIS — D485 Neoplasm of uncertain behavior of skin: Secondary | ICD-10-CM | POA: Diagnosis not present

## 2017-11-27 DIAGNOSIS — D225 Melanocytic nevi of trunk: Secondary | ICD-10-CM | POA: Diagnosis not present

## 2017-12-07 ENCOUNTER — Telehealth: Payer: Self-pay | Admitting: Family Medicine

## 2017-12-07 DIAGNOSIS — E7800 Pure hypercholesterolemia, unspecified: Secondary | ICD-10-CM

## 2017-12-07 DIAGNOSIS — E559 Vitamin D deficiency, unspecified: Secondary | ICD-10-CM

## 2017-12-07 DIAGNOSIS — Z0000 Encounter for general adult medical examination without abnormal findings: Secondary | ICD-10-CM

## 2017-12-07 NOTE — Telephone Encounter (Signed)
-----   Message from Ellamae Sia sent at 12/04/2017  3:34 PM EST ----- Regarding: Lab orders for Friday, 3.8.19 Patient is scheduled for CPX labs, please order future labs, Thanks , Karna Christmas

## 2017-12-12 ENCOUNTER — Other Ambulatory Visit: Payer: PPO

## 2017-12-12 ENCOUNTER — Other Ambulatory Visit (INDEPENDENT_AMBULATORY_CARE_PROVIDER_SITE_OTHER): Payer: PPO

## 2017-12-12 DIAGNOSIS — E7800 Pure hypercholesterolemia, unspecified: Secondary | ICD-10-CM

## 2017-12-12 DIAGNOSIS — E559 Vitamin D deficiency, unspecified: Secondary | ICD-10-CM | POA: Diagnosis not present

## 2017-12-12 DIAGNOSIS — Z0000 Encounter for general adult medical examination without abnormal findings: Secondary | ICD-10-CM | POA: Diagnosis not present

## 2017-12-12 LAB — CBC WITH DIFFERENTIAL/PLATELET
Basophils Absolute: 0 10*3/uL (ref 0.0–0.1)
Basophils Relative: 0.6 % (ref 0.0–3.0)
EOS PCT: 2.3 % (ref 0.0–5.0)
Eosinophils Absolute: 0.1 10*3/uL (ref 0.0–0.7)
HEMATOCRIT: 44.3 % (ref 36.0–46.0)
Hemoglobin: 15.2 g/dL — ABNORMAL HIGH (ref 12.0–15.0)
LYMPHS ABS: 1.6 10*3/uL (ref 0.7–4.0)
Lymphocytes Relative: 34.3 % (ref 12.0–46.0)
MCHC: 34.4 g/dL (ref 30.0–36.0)
MCV: 95.2 fl (ref 78.0–100.0)
MONOS PCT: 5.7 % (ref 3.0–12.0)
Monocytes Absolute: 0.3 10*3/uL (ref 0.1–1.0)
NEUTROS ABS: 2.7 10*3/uL (ref 1.4–7.7)
NEUTROS PCT: 57.1 % (ref 43.0–77.0)
PLATELETS: 196 10*3/uL (ref 150.0–400.0)
RBC: 4.66 Mil/uL (ref 3.87–5.11)
RDW: 13.6 % (ref 11.5–15.5)
WBC: 4.7 10*3/uL (ref 4.0–10.5)

## 2017-12-12 LAB — COMPREHENSIVE METABOLIC PANEL
ALT: 33 U/L (ref 0–35)
AST: 29 U/L (ref 0–37)
Albumin: 4 g/dL (ref 3.5–5.2)
Alkaline Phosphatase: 77 U/L (ref 39–117)
BUN: 19 mg/dL (ref 6–23)
CHLORIDE: 104 meq/L (ref 96–112)
CO2: 30 meq/L (ref 19–32)
Calcium: 10.1 mg/dL (ref 8.4–10.5)
Creatinine, Ser: 0.79 mg/dL (ref 0.40–1.20)
GFR: 77.46 mL/min (ref >60.00)
GLUCOSE: 81 mg/dL (ref 70–99)
POTASSIUM: 4.3 meq/L (ref 3.5–5.1)
Sodium: 140 mEq/L (ref 135–145)
Total Bilirubin: 0.5 mg/dL (ref 0.2–1.2)
Total Protein: 6.8 g/dL (ref 6.0–8.3)

## 2017-12-12 LAB — LIPID PANEL
CHOL/HDL RATIO: 3
Cholesterol: 172 mg/dL (ref 0–200)
HDL: 53.7 mg/dL (ref >39.00)
LDL CALC: 96 mg/dL (ref 0–99)
NONHDL: 117.99
Triglycerides: 109 mg/dL (ref 0.0–149.0)
VLDL: 21.8 mg/dL (ref 0.0–40.0)

## 2017-12-12 LAB — TSH: TSH: 1.93 u[IU]/mL (ref 0.35–4.50)

## 2017-12-12 LAB — VITAMIN D 25 HYDROXY (VIT D DEFICIENCY, FRACTURES): VITD: 44.4 ng/mL (ref 30.00–100.00)

## 2017-12-16 ENCOUNTER — Encounter: Payer: Managed Care, Other (non HMO) | Admitting: Gynecology

## 2017-12-18 ENCOUNTER — Telehealth: Payer: Self-pay | Admitting: Family Medicine

## 2017-12-18 NOTE — Telephone Encounter (Signed)
Opened in error

## 2017-12-19 ENCOUNTER — Ambulatory Visit (INDEPENDENT_AMBULATORY_CARE_PROVIDER_SITE_OTHER): Payer: PPO | Admitting: Family Medicine

## 2017-12-19 ENCOUNTER — Encounter: Payer: Self-pay | Admitting: Family Medicine

## 2017-12-19 VITALS — BP 108/68 | HR 79 | Temp 97.7°F | Ht 65.0 in | Wt 177.5 lb

## 2017-12-19 DIAGNOSIS — E7800 Pure hypercholesterolemia, unspecified: Secondary | ICD-10-CM | POA: Diagnosis not present

## 2017-12-19 DIAGNOSIS — M8589 Other specified disorders of bone density and structure, multiple sites: Secondary | ICD-10-CM | POA: Diagnosis not present

## 2017-12-19 DIAGNOSIS — R05 Cough: Secondary | ICD-10-CM | POA: Diagnosis not present

## 2017-12-19 DIAGNOSIS — R059 Cough, unspecified: Secondary | ICD-10-CM | POA: Insufficient documentation

## 2017-12-19 DIAGNOSIS — Z0000 Encounter for general adult medical examination without abnormal findings: Secondary | ICD-10-CM | POA: Insufficient documentation

## 2017-12-19 DIAGNOSIS — Z0001 Encounter for general adult medical examination with abnormal findings: Secondary | ICD-10-CM | POA: Diagnosis not present

## 2017-12-19 DIAGNOSIS — Z23 Encounter for immunization: Secondary | ICD-10-CM

## 2017-12-19 DIAGNOSIS — K219 Gastro-esophageal reflux disease without esophagitis: Secondary | ICD-10-CM

## 2017-12-19 DIAGNOSIS — E559 Vitamin D deficiency, unspecified: Secondary | ICD-10-CM | POA: Diagnosis not present

## 2017-12-19 MED ORDER — ATENOLOL 25 MG PO TABS: 12.5000 mg | ORAL_TABLET | Freq: Every day | ORAL | 5 refills | Status: DC | PRN

## 2017-12-19 MED ORDER — ESCITALOPRAM OXALATE 10 MG PO TABS: 5.0000 mg | ORAL_TABLET | Freq: Every day | ORAL | 3 refills | Status: DC

## 2017-12-19 MED ORDER — DICLOFENAC SODIUM 1 % TD GEL: TRANSDERMAL | 3 refills | Status: DC

## 2017-12-19 NOTE — Patient Instructions (Addendum)
Since you have sore throat and some cough - consider trying nexium every day in the am  Call next week and let us know what day you want to come in for a chest xray   prevnar vaccine today for pneumonia protection   Wear better shoes to prevent falls   Please work on advance directive - power of attorney and living will  The blue packet

## 2017-12-19 NOTE — Progress Notes (Signed)
Subjective:    Patient ID: [name] [name], female    DOB: [birth year], 67 y.o.   MRN: [id number]  HPI I have personally reviewed the Medicare Annual Wellness questionnaire and have noted 1. The patient's medical and social history 2. Their use of alcohol, tobacco or illicit drugs 3. Their current medications and supplements 4. The patient's functional ability including ADL's, fall risks, home safety risks and hearing or visual             impairment. 5. Diet and physical activities 6. Evidence for depression or mood disorders  The patients weight, height, BMI have been recorded in the chart and visual acuity is per eye clinic.  I have made referrals, counseling and provided education to the patient based review of the above and I have provided the pt with a written personalized care plan for preventive services. Reviewed and updated provider list, see scanned forms.  Wt Readings from Last 3 Encounters:  12/19/17 177 lb 8 oz (80.5 kg)  10/28/17 176 lb 12 oz (80.2 kg)  10/04/17 176 lb (79.8 kg)   29.54 kg/m   She was concerned about her 02 sat - 94%  Feels ok today  occ a little nagging cough (she has had chemical exp and used to smoke)  ST- saw ENT and diag with gerd in the past  Does not take her nexium every day    See scanned forms.  Routine anticipatory guidance given to patient.  See health maintenance. Colon cancer screening  Colonoscopy 8/16 nl with 10 y recall  Breast cancer screening mammogram 10/18 nl  Self breast exam-no lumps  Sees gyn and has had a hysterectomy-visit is 5/2 with Dr Anthonette Legato dermatology- regularly 4/5 is next appt/hx of basal cell  Flu vaccine-declines  Tetanus vaccine 3/18 Pneumovax-due for PNA 13  Zoster vaccine -zostavax 2/15 dexa 10/17 osteopenia (D level is 44.4) No fractures Had a fall in the grocery store (bad shoes) (no injury) - no longer wears those shoes or flip flops  Advance directive-given info/ to work on  Cognitive  function addressed- see scanned forms- and if abnormal then additional documentation follows. -fine /no problems to report    PMH and SH reviewed  Meds, vitals, and allergies reviewed.   ROS: See HPI.  Otherwise negative.    Hyperlipidemia Lab Results  Component Value Date   CHOL 172 12/12/2017   CHOL 170 12/11/2016   CHOL 185 09/28/2015   Lab Results  Component Value Date   HDL 53.70 12/12/2017   HDL 51.10 12/11/2016   HDL 47.80 09/28/2015   Lab Results  Component Value Date   LDLCALC 96 12/12/2017   LDLCALC 105 (H) 12/11/2016   LDLCALC 102 (H) 09/28/2015   Lab Results  Component Value Date   TRIG 109.0 12/12/2017   TRIG 69.0 12/11/2016   TRIG 176.0 (H) 09/28/2015   Lab Results  Component Value Date   CHOLHDL 3 12/12/2017   CHOLHDL 3 12/11/2016   CHOLHDL 4 09/28/2015   Lab Results  Component Value Date   LDLDIRECT 124.4 09/12/2010  improved from last time    Lab Results  Component Value Date   WBC 4.7 12/12/2017   HGB 15.2 (H) 12/12/2017   HCT 44.3 12/12/2017   MCV 95.2 12/12/2017   PLT 196.0 12/12/2017     Lab Results  Component Value Date   CREATININE 0.79 12/12/2017   BUN 19 12/12/2017   NA 140 12/12/2017   K 4.3 12/12/2017  CL 104 12/12/2017   CO2 30 12/12/2017   Lab Results  Component Value Date   ALT 33 12/12/2017   AST 29 12/12/2017   ALKPHOS 77 12/12/2017   BILITOT 0.5 12/12/2017    Glucose is 81    Hearing Screening   125Hz  250Hz  500Hz  1000Hz  2000Hz  3000Hz  4000Hz  6000Hz  8000Hz   Right ear:   40 40 40  40    Left ear:   40 40 40  40    Vision Screening Comments: Pt had eye exam 11/2017 with Dr. [place]   Patient Active Problem List   Diagnosis Date Noted  . Welcome to Medicare preventive visit 12/19/2017  . Cough 12/19/2017  . Anxiety 09/24/2017  . Sleep disorder 09/12/2017  . Urinary frequency 05/02/2017  . Palpitations 05/22/2015  . OA (osteoarthritis) of knee 11/21/2014  . Colon cancer screening 11/29/2013  . Routine  general medical examination at a health care facility 09/16/2011  . DERMATITIS, ATOPIC 11/27/2010  . URTICARIA DUE TO COLD OR HEAT 11/27/2010  . STRESS REACTION, ACUTE, WITH EMOTIONAL DISTURBANCE 09/14/2010  . THROAT PAIN, CHRONIC 09/14/2010  . EXTERNAL HEMORRHOIDS WITHOUT MENTION COMP 06/25/2010  . ABNORMAL EKG 12/26/2009  . OTHER CHRONIC SINUSITIS 10/19/2009  . Vitamin D deficiency 04/21/2009  . ALLERGIC RHINITIS 11/09/2008  . GERD 07/28/2008  . Osteopenia 03/14/2008  . HYPERCHOLESTEROLEMIA, PURE 03/13/2007  . HIATAL HERNIA 01/12/2007   Past Medical History:  Diagnosis Date  . Acute meniscal tear of knee   . Arthritis    "neck; left knee before replacement" (05/10/2015)  . Basal cell cancer 5/08  . Breast cyst    "left"  . Chest pain 9/11   [facility]) and palpitations - ruled out for MI with nl. Echo  . Complication of anesthesia HARD TO WAKE  . Difficulty sleeping   . Eczema   . GERD (gastroesophageal reflux disease)   . Heart palpitations OCCASIONAL--  TAKES ATENOLOL PRN  . Hiatal hernia   . HSV-2 infection    buttocks  . Hyperlipidemia DIET CONTROL  . Melanoma of skin ([place])   . MVP (mitral valve prolapse)    per pt no treatment needed  . Normal nuclear stress test 07-09-10  . Osteopenia 12/2013   T score -1.6 FRAX 16%/0.8%  . PONV (postoperative nausea and vomiting)    Past Surgical History:  Procedure Laterality Date  . Carotid US  9/05   Mild, recheck 1 year  . Carotid US  10/06   Normal  . CHONDROPLASTY  10/23/2011   Procedure: CHONDROPLASTY;  Surgeon: Gearlean Alf, MD;  Location: [facility];  Service: Orthopedics;  Laterality: Left;  Left medial   . COLONOSCOPY    . ESOPHAGOGASTRODUODENOSCOPY  9/07   Normal  . HYSTEROSCOPY WITH RESECTOSCOPE  2000   POLPECTOMY'S  . JOINT REPLACEMENT    . KNEE ARTHROSCOPY  10/23/2011   Procedure: ARTHROSCOPY KNEE;  Surgeon: Gearlean Alf, MD;  Location[facility];  Service: Orthopedics;   Laterality: Left;  LEFT KNEE SCOPE WITH DEBRIDEMENT   . MELANOMA EXCISION Right 1995   "side"  . MOHS SURGERY Left    "side of my nose"  . MOLE REMOVAL     "several; all over my body"  . MRI neck  3/00   C4-C5 herniation small stenosis mild C4-5, C5-6  . TONSILLECTOMY    . TOTAL KNEE ARTHROPLASTY Left 11/21/2014   Procedure: LEFT TOTAL KNEE ARTHROPLASTY;  Surgeon: Gearlean Alf, MD;  Location: [facility];  Service: Orthopedics;  Laterality: Left;  . TRANSTHORACIC ECHOCARDIOGRAM  06-29-2010   NORMAL LVSF, EF 55-60%,  MILD MR  . TUBAL LIGATION  1989  . VAGINAL HYSTERECTOMY  2001   complex hyperplasia with mother's history of uterine cancer   Social History   Tobacco Use  . Smoking status: Former Smoker    Packs/day: 1.00    Years: 15.00    Pack years: 15.00    Types: Cigarettes    Last attempt to quit: 10/07/1978    Years since quitting: 39.2  . Smokeless tobacco: Never Used  Substance Use Topics  . Alcohol use: No    Alcohol/week: 0.0 oz  . Drug use: No   Family History  Problem Relation Age of Onset  . Cancer Mother        uterine  . Obesity Mother   . Thrombocytopenia Mother   . Diabetes Mother   . Alcohol abuse Father   . Heart disease Father        CAD in 77's  . Emphysema Father   . Diabetes Maternal Grandmother        type 2  . Heart disease Paternal Aunt        CAD  . Heart disease Paternal Uncle        CAD  . Colon cancer Neg Hx    Allergies  Allergen Reactions  . Amoxicillin Rash  . Codeine Rash  . Macrobid [Nitrofurantoin]     GI issues  . Clarithromycin Other (See Comments)    REACTION: reaction not known  . Trazodone And Nefazodone Palpitations   Current Outpatient Medications on File Prior to Visit  Medication Sig Dispense Refill  . acyclovir (ZOVIRAX) 200 MG capsule TAKE 1 CAPSULE BY MOUTH DAILY AS NEEDED 60 capsule 3  . acyclovir cream (ZOVIRAX) 5 % Apply 1 application topically daily as needed. 5 g 3  . Ascorbic Acid (VITAMIN C PO) Take 1  tablet by mouth daily.    Marland Kitchen aspirin EC 81 MG tablet Take 1 tablet (81 mg total) by mouth daily.    Marland Kitchen CALCIUM CARBONATE PO Take 1 tablet by mouth daily.    . Cholecalciferol (VITAMIN D PO) Take 1 tablet by mouth daily.    Marland Kitchen CINNAMON PO Take 1 capsule by mouth. Takes occasionally    . CYANOCOBALAMIN PO Take 1 tablet by mouth daily.    . diclofenac (VOLTAREN) 50 MG EC tablet TAKE 1 TABLET BY MOUTH TWICE A DAY AS NEEDED WITH FOOD  0  . KRILL OIL PO Take by mouth. Takes 1 tab occasionally    . magnesium gluconate (MAGONATE) 500 MG tablet Take 500 mg by mouth daily.    . Multiple Vitamin (MULTIVITAMIN) capsule Take 1 capsule by mouth daily.    Vladimir Faster Glycol-Propyl Glycol (SYSTANE OP) Apply 1 drop to eye daily as needed (Dry eyes).     Current Facility-Administered Medications on File Prior to Visit  Medication Dose Route Frequency Provider Last Rate Last Dose  . gi cocktail (Maalox,Lidocaine,Donnatal)  30 mL Oral Once Briscoe Deutscher, DO         Review of Systems  Constitutional: Negative for activity change, appetite change, fatigue, fever and unexpected weight change.  HENT: Negative for congestion, ear pain, rhinorrhea, sinus pressure and sore throat.        Throat clearing   Eyes: Negative for pain, redness and visual disturbance.  Respiratory: Positive for cough. Negative for shortness of breath and wheezing.  Cardiovascular: Negative for chest pain and palpitations.  Gastrointestinal: Negative for abdominal pain, blood in stool, constipation and diarrhea.  Endocrine: Negative for polydipsia and polyuria.  Genitourinary: Negative for dysuria, frequency and urgency.  Musculoskeletal: Negative for arthralgias, back pain and myalgias.  Skin: Negative for pallor and rash.  Allergic/Immunologic: Negative for environmental allergies.  Neurological: Negative for dizziness, syncope and headaches.  Hematological: Negative for adenopathy. Does not bruise/bleed easily.    Psychiatric/Behavioral: Negative for decreased concentration and dysphoric mood. The patient is not nervous/anxious.        Objective:   Physical Exam  Constitutional: She appears well-developed and well-nourished. No distress.  HENT:  Head: Normocephalic and atraumatic.  Right Ear: External ear normal.  Left Ear: External ear normal.  Mouth/Throat: Oropharynx is clear and moist.  Eyes: Conjunctivae and EOM are normal. Pupils are equal, round, and reactive to light. No scleral icterus.  Neck: Normal range of motion. Neck supple. No JVD present. Carotid bruit is not present. No thyromegaly present.  Cardiovascular: Normal rate, regular rhythm, normal heart sounds and intact distal pulses. Exam reveals no gallop.  Pulmonary/Chest: Effort normal and breath sounds normal. No respiratory distress. She has no wheezes. She exhibits no tenderness.  Abdominal: Soft. Bowel sounds are normal. She exhibits no distension, no abdominal bruit and no mass. There is no tenderness.  Genitourinary:  Genitourinary Comments: Sees gyn for breast/pelvic exam  Musculoskeletal: Normal range of motion. She exhibits no edema or tenderness.  No kyphosis   Lymphadenopathy:    She has no cervical adenopathy.  Neurological: She is alert. She has normal reflexes. No cranial nerve deficit. She exhibits normal muscle tone. Coordination normal.  Skin: Skin is warm and dry. No rash noted. No erythema. No pallor.  Solar lentigines diffusely  Some solar aging   Psychiatric: She has a normal mood and affect.          Assessment & Plan:   Problem List Items Addressed This Visit      Digestive   GERD    More cough and throat clearing lately- adv to take her nexium daily instead of prn  Will let us know if not improved  Plans on returning next week for cxr for cough        Musculoskeletal and Integument   Osteopenia    Rev dexa 2017- can get in oct or later  No fx  Disc fall prev in detail  Disc need for  calcium/ vitamin D/ wt bearing exercise and bone density test every 2 y to monitor Disc safety/ fracture risk in detail          Other   Cough    Suspect due to GERD-will start nexium every day  Also plan on return for cxr next week  Nl exam      HYPERCHOLESTEROLEMIA, PURE    Disc goals for lipids and reasons to control them Rev labs with pt Rev low sat fat diet in detail LDL improved with better diet       Relevant Medications   atenolol (TENORMIN) 25 MG tablet   Vitamin D deficiency    Level in 19s Vitamin D level is therapeutic with current supplementation Disc importance of this to bone and overall health       Welcome to Medicare preventive visit - Primary    Reviewed health habits including diet and exercise and skin cancer prevention Reviewed appropriate screening tests for age  Also reviewed health mt list, fam hx and immunization  status , as well as social and family history   See HPI Labs reviewed  Disc working on adv directive dexa due in the fall  Declines flu vaccine  PNA 13 vaccine today         Other Visit Diagnoses    Need for vaccination with 13-polyvalent pneumococcal conjugate vaccine       Relevant Orders   Pneumococcal conjugate vaccine 13-valent (Completed)

## 2017-12-21 NOTE — Assessment & Plan Note (Signed)
Reviewed health habits including diet and exercise and skin cancer prevention Reviewed appropriate screening tests for age  Also reviewed health mt list, fam hx and immunization status , as well as social and family history   See HPI Labs reviewed  Disc working on adv directive dexa due in the fall  Declines flu vaccine  PNA 13 vaccine today

## 2017-12-21 NOTE — Assessment & Plan Note (Signed)
Level in 40s Vitamin D level is therapeutic with current supplementation Disc importance of this to bone and overall health  

## 2017-12-21 NOTE — Assessment & Plan Note (Signed)
Rev dexa 2017- can get in oct or later  No fx  Disc fall prev in detail  Disc need for calcium/ vitamin D/ wt bearing exercise and bone density test every 2 y to monitor Disc safety/ fracture risk in detail

## 2017-12-21 NOTE — Assessment & Plan Note (Addendum)
More cough and throat clearing lately- adv to take her nexium daily instead of prn  Will let us know if not improved  Plans on returning next week for cxr for cough

## 2017-12-21 NOTE — Assessment & Plan Note (Signed)
Suspect due to GERD-will start nexium every day  Also plan on return for cxr next week  Nl exam

## 2017-12-21 NOTE — Assessment & Plan Note (Signed)
Disc goals for lipids and reasons to control them Rev labs with pt Rev low sat fat diet in detail LDL improved with better diet

## 2017-12-22 ENCOUNTER — Telehealth: Payer: Self-pay | Admitting: Family Medicine

## 2017-12-22 DIAGNOSIS — R059 Cough, unspecified: Secondary | ICD-10-CM

## 2017-12-22 DIAGNOSIS — R05 Cough: Secondary | ICD-10-CM

## 2017-12-22 NOTE — Telephone Encounter (Signed)
Copied from [employer]. Topic: General - Other >> Dec 22, 2017 11:04 AM Carolyn Stare wrote:  Pt had a physical on 12/19/17 and was told by Dr Glori Bickers to call back and schedule a chest xray but there is no order for an xray in Epic   [phone]

## 2017-12-22 NOTE — Telephone Encounter (Signed)
I put the future order in for the cxr when she wants to come Thanks

## 2017-12-22 NOTE — Telephone Encounter (Signed)
Pt notified Xray order is in and she doesn't need an appt for an xray

## 2017-12-23 ENCOUNTER — Encounter: Payer: Managed Care, Other (non HMO) | Admitting: Gynecology

## 2017-12-24 ENCOUNTER — Ambulatory Visit (INDEPENDENT_AMBULATORY_CARE_PROVIDER_SITE_OTHER)
Admission: RE | Admit: 2017-12-24 | Discharge: 2017-12-24 | Disposition: A | Discharge status: Home / Self Care | Payer: PPO | Source: Ambulatory Visit | Attending: Family Medicine | Admitting: Family Medicine

## 2017-12-24 DIAGNOSIS — R059 Cough, unspecified: Secondary | ICD-10-CM

## 2017-12-24 DIAGNOSIS — R05 Cough: Secondary | ICD-10-CM | POA: Diagnosis not present

## 2018-01-09 DIAGNOSIS — D1801 Hemangioma of skin and subcutaneous tissue: Secondary | ICD-10-CM | POA: Diagnosis not present

## 2018-01-09 DIAGNOSIS — D225 Melanocytic nevi of trunk: Secondary | ICD-10-CM | POA: Diagnosis not present

## 2018-01-09 DIAGNOSIS — D2272 Melanocytic nevi of left lower limb, including hip: Secondary | ICD-10-CM | POA: Diagnosis not present

## 2018-01-09 DIAGNOSIS — D2261 Melanocytic nevi of right upper limb, including shoulder: Secondary | ICD-10-CM | POA: Diagnosis not present

## 2018-01-09 DIAGNOSIS — D485 Neoplasm of uncertain behavior of skin: Secondary | ICD-10-CM | POA: Diagnosis not present

## 2018-01-09 DIAGNOSIS — L570 Actinic keratosis: Secondary | ICD-10-CM | POA: Diagnosis not present

## 2018-01-09 DIAGNOSIS — L814 Other melanin hyperpigmentation: Secondary | ICD-10-CM | POA: Diagnosis not present

## 2018-01-09 DIAGNOSIS — L820 Inflamed seborrheic keratosis: Secondary | ICD-10-CM | POA: Diagnosis not present

## 2018-01-09 DIAGNOSIS — L821 Other seborrheic keratosis: Secondary | ICD-10-CM | POA: Diagnosis not present

## 2018-01-09 DIAGNOSIS — Z85828 Personal history of other malignant neoplasm of skin: Secondary | ICD-10-CM | POA: Diagnosis not present

## 2018-02-05 ENCOUNTER — Encounter: Payer: Managed Care, Other (non HMO) | Admitting: Gynecology

## 2018-02-09 ENCOUNTER — Other Ambulatory Visit: Payer: Self-pay

## 2018-02-09 ENCOUNTER — Encounter: Payer: Self-pay | Admitting: Internal Medicine

## 2018-02-09 ENCOUNTER — Ambulatory Visit (INDEPENDENT_AMBULATORY_CARE_PROVIDER_SITE_OTHER): Payer: PPO | Admitting: Internal Medicine

## 2018-02-09 VITALS — BP 116/78 | HR 63 | Temp 98.1°F | Ht 64.5 in | Wt 179.0 lb

## 2018-02-09 DIAGNOSIS — R0982 Postnasal drip: Secondary | ICD-10-CM | POA: Diagnosis not present

## 2018-02-09 DIAGNOSIS — J029 Acute pharyngitis, unspecified: Secondary | ICD-10-CM

## 2018-02-09 DIAGNOSIS — H6121 Impacted cerumen, right ear: Secondary | ICD-10-CM

## 2018-02-09 LAB — POCT RAPID STREP A (OFFICE): RAPID STREP A SCREEN: NEGATIVE

## 2018-02-09 MED ORDER — METHYLPREDNISOLONE ACETATE 80 MG/ML IJ SUSP
80.0000 mg | Freq: Once | INTRAMUSCULAR | Status: AC
  Administered 2018-02-09: 80 mg via INTRAMUSCULAR

## 2018-02-09 NOTE — Addendum Note (Signed)
Addended by: Lurlean Nanny on: 02/09/2018 12:34 PM   Modules accepted: Orders

## 2018-02-09 NOTE — Patient Instructions (Signed)

## 2018-02-09 NOTE — Progress Notes (Signed)
Subjective:    Patient ID: [name] [name], female    DOB: 10-26-1950, 67 y.o.   MRN: [id number]  HPI  Pt presents to the clinic today wit hc/o sore throat. She reports this started 2-3 days ago, but seems worse las night. She does have some ear fullness and cough. She denies decreased hearing. The cough is productive of clear mucous. She denies runny nose, nasal congestion. She denies fever, chills or body aches. She has tried warm salt water gargles, Mucinex and Tylenol with minimal relief. She has not had sick contacts. She does have a history of allergies.  Review of Systems      Past Medical History:  Diagnosis Date  . Acute meniscal tear of knee   . Arthritis    "neck; left knee before replacement" (05/10/2015)  . Basal cell cancer 5/08  . Breast cyst    "left"  . Chest pain 9/11   [facility]) and palpitations - ruled out for MI with nl. Echo  . Complication of anesthesia HARD TO WAKE  . Difficulty sleeping   . Eczema   . GERD (gastroesophageal reflux disease)   . Heart palpitations OCCASIONAL--  TAKES ATENOLOL PRN  . Hiatal hernia   . HSV-2 infection    buttocks  . Hyperlipidemia DIET CONTROL  . Melanoma of skin ([place])   . MVP (mitral valve prolapse)    per pt no treatment needed  . Normal nuclear stress test 07-09-10  . Osteopenia 12/2013   T score -1.6 FRAX 16%/0.8%  . PONV (postoperative nausea and vomiting)     Current Outpatient Medications  Medication Sig Dispense Refill  . acyclovir (ZOVIRAX) 200 MG capsule TAKE 1 CAPSULE BY MOUTH DAILY AS NEEDED 60 capsule 3  . acyclovir cream (ZOVIRAX) 5 % Apply 1 application topically daily as needed. 5 g 3  . Ascorbic Acid (VITAMIN C PO) Take 1 tablet by mouth daily.    Marland Kitchen aspirin EC 81 MG tablet Take 1 tablet (81 mg total) by mouth daily.    Marland Kitchen atenolol (TENORMIN) 25 MG tablet Take 0.5 tablets (12.5 mg total) by mouth daily as needed (palpatations). 30 tablet 5  . CALCIUM CARBONATE PO Take 1 tablet by mouth daily.    .  Cholecalciferol (VITAMIN D PO) Take 1 tablet by mouth daily.    Marland Kitchen CINNAMON PO Take 1 capsule by mouth. Takes occasionally    . CYANOCOBALAMIN PO Take 1 tablet by mouth daily.    . diclofenac (VOLTAREN) 50 MG EC tablet TAKE 1 TABLET BY MOUTH TWICE A DAY AS NEEDED WITH FOOD  0  . diclofenac sodium (VOLTAREN) 1 % GEL APPLY 4 GRAMS TO THE AFFECTED AREA 3 TIMES DAILY AS NEEDED 1 Tube 3  . escitalopram (LEXAPRO) 10 MG tablet Take 0.5 tablets (5 mg total) by mouth daily. 45 tablet 3  . KRILL OIL PO Take by mouth. Takes 1 tab occasionally    . magnesium gluconate (MAGONATE) 500 MG tablet Take 500 mg by mouth daily.    . Multiple Vitamin (MULTIVITAMIN) capsule Take 1 capsule by mouth daily.    Vladimir Faster Glycol-Propyl Glycol (SYSTANE OP) Apply 1 drop to eye daily as needed (Dry eyes).     Current Facility-Administered Medications  Medication Dose Route Frequency Provider Last Rate Last Dose  . gi cocktail (Maalox,Lidocaine,Donnatal)  30 mL Oral Once Briscoe Deutscher, DO        Allergies  Allergen Reactions  . Amoxicillin Rash  . Codeine Rash  .  Macrobid [Nitrofurantoin]     GI issues  . Clarithromycin Other (See Comments)    REACTION: reaction not known  . Trazodone And Nefazodone Palpitations    Family History  Problem Relation Age of Onset  . Cancer Mother        uterine  . Obesity Mother   . Thrombocytopenia Mother   . Diabetes Mother   . Alcohol abuse Father   . Heart disease Father        CAD in 31's  . Emphysema Father   . Diabetes Maternal Grandmother        type 2  . Heart disease Paternal Aunt        CAD  . Heart disease Paternal Uncle        CAD  . Colon cancer Neg Hx     Social History   Socioeconomic History  . Marital status: Married    Spouse name: Not on file  . Number of children: Not on file  . Years of education: Not on file  . Highest education level: Not on file  Occupational History  . Not on file  Social Needs  . Financial resource strain: Not on  file  . Food insecurity:    Worry: Not on file    Inability: Not on file  . Transportation needs:    Medical: Not on file    Non-medical: Not on file  Tobacco Use  . Smoking status: Former Smoker    Packs/day: 1.00    Years: 15.00    Pack years: 15.00    Types: Cigarettes    Last attempt to quit: 10/07/1978    Years since quitting: 39.3  . Smokeless tobacco: Never Used  Substance and Sexual Activity  . Alcohol use: No    Alcohol/week: 0.0 oz  . Drug use: No  . Sexual activity: Yes    Birth control/protection: Post-menopausal, Surgical    Comment: HYST-1st intercourse 67 yo-Fewer than 5 partners  Lifestyle  . Physical activity:    Days per week: Not on file    Minutes per session: Not on file  . Stress: Not on file  Relationships  . Social connections:    Talks on phone: Not on file    Gets together: Not on file    Attends religious service: Not on file    Active member of club or organization: Not on file    Attends meetings of clubs or organizations: Not on file    Relationship status: Not on file  . Intimate partner violence:    Fear of current or ex partner: Not on file    Emotionally abused: Not on file    Physically abused: Not on file    Forced sexual activity: Not on file  Other Topics Concern  . Not on file  Social History Narrative   Daily caffeine use.   Patient gets regular exercise.     Constitutional: Denies fever, malaise, fatigue, headache or abrupt weight changes.  HEENT: Pt reports ear fullness and sore throat. Denies eye pain, eye redness, ear pain, ringing in the ears, wax buildup, runny nose, nasal congestion, bloody nose. Respiratory: Pt reports cough. Denies difficulty breathing, shortness of breath.   Cardiovascular: Denies chest pain, chest tightness, palpitations or swelling in the hands or feet.   No other specific complaints in a complete review of systems (except as listed in HPI above).  Objective:   Physical Exam  BP 116/78    Pulse 63   Temp  98.1 F (36.7 C) (Oral)   Ht 5' 4.5" (1.638 m)   Wt 179 lb (81.2 kg)   SpO2 97%   BMI 30.25 kg/m  Wt Readings from Last 3 Encounters:  02/09/18 179 lb (81.2 kg)  12/19/17 177 lb 8 oz (80.5 kg)  10/28/17 176 lb 12 oz (80.2 kg)    General: Appears her stated age, well developed, well nourished in NAD. HEENT: Head: normal shape and size, no sinus tenderness noted; Left Ear: Tm's gray and intact, normal light reflex; Right Ear: cerumen impaction; Nose: mucosa pink and moist, septum midline; Throat/Mouth: Teeth present, mucosa erythematous and moist, + PND, no exudate, lesions or ulcerations noted.  Neck:  Bilateral anterior cervical adenopathy noted.  Cardiovascular: Normal rate and rhythm. Pulmonary/Chest: Normal effort and positive vesicular breath sounds. No respiratory distress. No wheezes, rales or ronchi noted.   BMET    Component Value Date/Time   NA 140 12/12/2017 0943   K 4.3 12/12/2017 0943   CL 104 12/12/2017 0943   CO2 30 12/12/2017 0943   GLUCOSE 81 12/12/2017 0943   BUN 19 12/12/2017 0943   CREATININE 0.79 12/12/2017 0943   CREATININE 0.81 04/19/2015 1738   CALCIUM 10.1 12/12/2017 0943   GFRNONAA >60 09/06/2017 1309   GFRAA >60 09/06/2017 1309    Lipid Panel     Component Value Date/Time   CHOL 172 12/12/2017 0943   TRIG 109.0 12/12/2017 0943   HDL 53.70 12/12/2017 0943   CHOLHDL 3 12/12/2017 0943   VLDL 21.8 12/12/2017 0943   LDLCALC 96 12/12/2017 0943    CBC    Component Value Date/Time   WBC 4.7 12/12/2017 0943   RBC 4.66 12/12/2017 0943   HGB 15.2 (H) 12/12/2017 0943   HCT 44.3 12/12/2017 0943   PLT 196.0 12/12/2017 0943   MCV 95.2 12/12/2017 0943   MCH 33.5 09/06/2017 1309   MCHC 34.4 12/12/2017 0943   RDW 13.6 12/12/2017 0943   LYMPHSABS 1.6 12/12/2017 0943   MONOABS 0.3 12/12/2017 0943   EOSABS 0.1 12/12/2017 0943   BASOSABS 0.0 12/12/2017 0943    Hgb A1C No results found for: HGBA1C          Assessment & Plan:    Sore Throat, Right Ear Cerumen Impaction:  RST: negative Likely r/t allergies Stop Mucinex Start Zyrtec She declines ear lavage today Advised Debrox or Hydrogen Peroxide 80 mg Depo IM today  Return precautions discussed Webb Silversmith, NP

## 2018-02-09 NOTE — Telephone Encounter (Signed)
Last filled 07/2016... Last OV 12/2017 welcome to [facility]... Please advise

## 2018-02-10 NOTE — Addendum Note (Signed)
Addended by: Lurlean Nanny on: 02/10/2018 09:24 AM   Modules accepted: Orders

## 2018-03-06 DIAGNOSIS — M1712 Unilateral primary osteoarthritis, left knee: Secondary | ICD-10-CM | POA: Diagnosis not present

## 2018-03-06 DIAGNOSIS — Z96652 Presence of left artificial knee joint: Secondary | ICD-10-CM | POA: Diagnosis not present

## 2018-03-06 DIAGNOSIS — Z471 Aftercare following joint replacement surgery: Secondary | ICD-10-CM | POA: Diagnosis not present

## 2018-04-03 ENCOUNTER — Other Ambulatory Visit: Payer: Self-pay | Admitting: *Deleted

## 2018-04-03 MED ORDER — ACYCLOVIR 200 MG PO CAPS: 200.0000 mg | ORAL_CAPSULE | Freq: Every day | ORAL | 1 refills | Status: DC | PRN

## 2018-04-06 ENCOUNTER — Encounter: Payer: Managed Care, Other (non HMO) | Admitting: Gynecology

## 2018-04-30 ENCOUNTER — Encounter: Payer: Self-pay | Admitting: Gynecology

## 2018-04-30 ENCOUNTER — Ambulatory Visit (INDEPENDENT_AMBULATORY_CARE_PROVIDER_SITE_OTHER): Payer: PPO | Admitting: Gynecology

## 2018-04-30 VITALS — BP 120/76 | Ht 65.0 in | Wt 181.0 lb

## 2018-04-30 DIAGNOSIS — Z1272 Encounter for screening for malignant neoplasm of vagina: Secondary | ICD-10-CM | POA: Diagnosis not present

## 2018-04-30 DIAGNOSIS — Z9189 Other specified personal risk factors, not elsewhere classified: Secondary | ICD-10-CM | POA: Diagnosis not present

## 2018-04-30 DIAGNOSIS — Z01419 Encounter for gynecological examination (general) (routine) without abnormal findings: Secondary | ICD-10-CM

## 2018-04-30 DIAGNOSIS — M8580 Other specified disorders of bone density and structure, unspecified site: Secondary | ICD-10-CM

## 2018-04-30 DIAGNOSIS — N952 Postmenopausal atrophic vaginitis: Secondary | ICD-10-CM

## 2018-04-30 NOTE — Progress Notes (Signed)
    [name] [name] 12/02/1950 [id number]        67 y.o.  G3P3 for breast and pelvic exam.  Without gynecologic complaints  Past medical history,surgical history, problem list, medications, allergies, family history and social history were all reviewed and documented as reviewed in the EPIC chart.  ROS:  Performed with pertinent positives and negatives included in the history, assessment and plan.   Additional significant findings : None   Exam: Caryn Bee assistant Vitals:   04/30/18 1148  BP: 120/76  Weight: 181 lb (82.1 kg)  Height: 5\' 5"  (1.651 m)   Body mass index is 30.12 kg/m.  General appearance:  Normal affect, orientation and appearance. Skin: Grossly normal HEENT: Without gross lesions.  No cervical or supraclavicular adenopathy. Thyroid normal.  Lungs:  Clear without wheezing, rales or rhonchi Cardiac: RR, without RMG Abdominal:  Soft, nontender, without masses, guarding, rebound, organomegaly or hernia Breasts:  Examined lying and sitting without masses, retractions, discharge or axillary adenopathy. Pelvic:  Ext, BUS, Vagina: With atrophic changes.  Pap smear of cuff done  Adnexa: Without masses or tenderness    Anus and perineum: Normal   Rectovaginal: Normal sphincter tone without palpated masses or tenderness.    Assessment/Plan:  67 y.o. G3P3 female for breast and pelvic exam.  Status post TVH for complex hyperplasia..  1. Postmenopausal/atrophic genital changes.  No significant menopausal symptoms. 2. Osteopenia.  DEXA 2017 T score -1.4 FRAX 8% / 0.9%.  Options to repeat DEXA now will wait another year to discussed.  Patient prefers to wait.  Increased calcium vitamin D. 3. Colonoscopy 2016.  Repeat at their recommended interval. 4. Mammography 07/2017.  Continue with annual mammography this coming fall.  Breast exam normal today. 5. Pap smear 2015.  Pap smear done today.  No history of abnormal Pap smears.  Options to stop screening per current screening  guidelines based on hysterectomy history and age reviewed. 6. Health maintenance.  No routine lab work done as patient will do this as her primary physician's office.  Follow-up in 1 year, sooner as needed.   Anastasio Auerbach MD, 12:17 PM 04/30/2018

## 2018-04-30 NOTE — Patient Instructions (Signed)
Follow-up in 1 year for annual exam 

## 2018-04-30 NOTE — Addendum Note (Signed)
Addended by: Nelva Nay on: 04/30/2018 12:23 PM   Modules accepted: Orders

## 2018-05-01 DIAGNOSIS — H04141 Primary lacrimal gland atrophy, right lacrimal gland: Secondary | ICD-10-CM | POA: Diagnosis not present

## 2018-05-01 LAB — PAP IG W/ RFLX HPV ASCU

## 2018-06-24 DIAGNOSIS — D485 Neoplasm of uncertain behavior of skin: Secondary | ICD-10-CM | POA: Diagnosis not present

## 2018-06-24 DIAGNOSIS — L918 Other hypertrophic disorders of the skin: Secondary | ICD-10-CM | POA: Diagnosis not present

## 2018-06-24 DIAGNOSIS — D0472 Carcinoma in situ of skin of left lower limb, including hip: Secondary | ICD-10-CM | POA: Diagnosis not present

## 2018-06-24 DIAGNOSIS — Z85828 Personal history of other malignant neoplasm of skin: Secondary | ICD-10-CM | POA: Diagnosis not present

## 2018-06-24 DIAGNOSIS — L821 Other seborrheic keratosis: Secondary | ICD-10-CM | POA: Diagnosis not present

## 2018-07-09 DIAGNOSIS — Z85828 Personal history of other malignant neoplasm of skin: Secondary | ICD-10-CM | POA: Diagnosis not present

## 2018-07-09 DIAGNOSIS — L570 Actinic keratosis: Secondary | ICD-10-CM | POA: Diagnosis not present

## 2018-07-09 DIAGNOSIS — D0472 Carcinoma in situ of skin of left lower limb, including hip: Secondary | ICD-10-CM | POA: Diagnosis not present

## 2018-07-20 ENCOUNTER — Other Ambulatory Visit: Payer: Self-pay | Admitting: Gynecology

## 2018-07-20 DIAGNOSIS — Z1231 Encounter for screening mammogram for malignant neoplasm of breast: Secondary | ICD-10-CM

## 2018-08-13 DIAGNOSIS — L820 Inflamed seborrheic keratosis: Secondary | ICD-10-CM | POA: Diagnosis not present

## 2018-08-13 DIAGNOSIS — L918 Other hypertrophic disorders of the skin: Secondary | ICD-10-CM | POA: Diagnosis not present

## 2018-08-13 DIAGNOSIS — Z85828 Personal history of other malignant neoplasm of skin: Secondary | ICD-10-CM | POA: Diagnosis not present

## 2018-08-25 ENCOUNTER — Ambulatory Visit
Admission: RE | Admit: 2018-08-25 | Discharge: 2018-08-25 | Disposition: A | Discharge status: Home / Self Care | Payer: PPO | Source: Ambulatory Visit | Attending: Gynecology | Admitting: Gynecology

## 2018-08-25 DIAGNOSIS — Z1231 Encounter for screening mammogram for malignant neoplasm of breast: Secondary | ICD-10-CM | POA: Diagnosis not present

## 2018-12-16 ENCOUNTER — Telehealth: Payer: Self-pay | Admitting: Family Medicine

## 2018-12-16 DIAGNOSIS — Z0000 Encounter for general adult medical examination without abnormal findings: Secondary | ICD-10-CM

## 2018-12-16 DIAGNOSIS — E7800 Pure hypercholesterolemia, unspecified: Secondary | ICD-10-CM

## 2018-12-16 DIAGNOSIS — E559 Vitamin D deficiency, unspecified: Secondary | ICD-10-CM

## 2018-12-16 NOTE — Telephone Encounter (Signed)
-----   Message from Eustace Pen, LPN sent at 12/16/8116  3:18 PM EDT ----- Regarding: 3/12 - labs Lab orders needed. Thank you.

## 2018-12-17 ENCOUNTER — Other Ambulatory Visit: Payer: Self-pay

## 2018-12-17 ENCOUNTER — Other Ambulatory Visit (INDEPENDENT_AMBULATORY_CARE_PROVIDER_SITE_OTHER): Payer: PPO

## 2018-12-17 DIAGNOSIS — Z0000 Encounter for general adult medical examination without abnormal findings: Secondary | ICD-10-CM | POA: Diagnosis not present

## 2018-12-17 DIAGNOSIS — E559 Vitamin D deficiency, unspecified: Secondary | ICD-10-CM

## 2018-12-17 DIAGNOSIS — E7800 Pure hypercholesterolemia, unspecified: Secondary | ICD-10-CM

## 2018-12-17 LAB — COMPREHENSIVE METABOLIC PANEL
ALK PHOS: 79 U/L (ref 39–117)
ALT: 21 U/L (ref 0–35)
AST: 23 U/L (ref 0–37)
Albumin: 4 g/dL (ref 3.5–5.2)
BUN: 22 mg/dL (ref 6–23)
CO2: 27 mEq/L (ref 19–32)
Calcium: 9.5 mg/dL (ref 8.4–10.5)
Chloride: 104 mEq/L (ref 96–112)
Creatinine, Ser: 0.84 mg/dL (ref 0.40–1.20)
GFR: 67.69 mL/min (ref >60.00)
GLUCOSE: 83 mg/dL (ref 70–99)
Potassium: 4.5 mEq/L (ref 3.5–5.1)
Sodium: 139 mEq/L (ref 135–145)
TOTAL PROTEIN: 6.6 g/dL (ref 6.0–8.3)
Total Bilirubin: 0.7 mg/dL (ref 0.2–1.2)

## 2018-12-17 LAB — LIPID PANEL
Cholesterol: 186 mg/dL (ref 0–200)
HDL: 55.3 mg/dL (ref >39.00)
LDL Cholesterol: 110 mg/dL — ABNORMAL HIGH (ref 0–99)
NONHDL: 130.93
Total CHOL/HDL Ratio: 3
Triglycerides: 107 mg/dL (ref 0.0–149.0)
VLDL: 21.4 mg/dL (ref 0.0–40.0)

## 2018-12-17 LAB — CBC WITH DIFFERENTIAL/PLATELET
Basophils Absolute: 0 10*3/uL (ref 0.0–0.1)
Basophils Relative: 0.4 % (ref 0.0–3.0)
EOS PCT: 1.8 % (ref 0.0–5.0)
Eosinophils Absolute: 0.1 10*3/uL (ref 0.0–0.7)
HCT: 43.3 % (ref 36.0–46.0)
Hemoglobin: 14.9 g/dL (ref 12.0–15.0)
LYMPHS ABS: 1.8 10*3/uL (ref 0.7–4.0)
Lymphocytes Relative: 38.1 % (ref 12.0–46.0)
MCHC: 34.5 g/dL (ref 30.0–36.0)
MCV: 94.5 fl (ref 78.0–100.0)
MONO ABS: 0.3 10*3/uL (ref 0.1–1.0)
Monocytes Relative: 6.7 % (ref 3.0–12.0)
NEUTROS PCT: 53 % (ref 43.0–77.0)
Neutro Abs: 2.5 10*3/uL (ref 1.4–7.7)
PLATELETS: 194 10*3/uL (ref 150.0–400.0)
RBC: 4.58 Mil/uL (ref 3.87–5.11)
RDW: 13.3 % (ref 11.5–15.5)
WBC: 4.7 10*3/uL (ref 4.0–10.5)

## 2018-12-17 LAB — VITAMIN D 25 HYDROXY (VIT D DEFICIENCY, FRACTURES): VITD: 49.04 ng/mL (ref 30.00–100.00)

## 2018-12-17 LAB — TSH: TSH: 1.37 u[IU]/mL (ref 0.35–4.50)

## 2018-12-18 ENCOUNTER — Telehealth: Payer: Self-pay | Admitting: *Deleted

## 2018-12-18 NOTE — Telephone Encounter (Signed)
Pt had a CPE scheduled on 12/22/18 but per Erin appt needed to be cancelled until the Covid 55 is resolved, pt was okay cancelling her CPE and I advised her we will call to r/s that at a later date but pt had labs done yesterday and would like Dr. Marliss Coots comments regarding her lab results

## 2018-12-18 NOTE — Telephone Encounter (Signed)
Labs all look quite stable including cholesterol Please mail her a copy if she would like

## 2018-12-21 NOTE — Telephone Encounter (Signed)
Pt notified of lab results and a copy mailed to her

## 2018-12-22 ENCOUNTER — Encounter: Payer: PPO | Admitting: Family Medicine

## 2018-12-24 ENCOUNTER — Other Ambulatory Visit: Payer: Self-pay

## 2018-12-24 ENCOUNTER — Encounter: Payer: Self-pay | Admitting: Family Medicine

## 2018-12-24 ENCOUNTER — Ambulatory Visit (INDEPENDENT_AMBULATORY_CARE_PROVIDER_SITE_OTHER): Payer: PPO | Admitting: Family Medicine

## 2018-12-24 VITALS — BP 104/70 | HR 69 | Temp 97.8°F | Ht 64.5 in | Wt 181.2 lb

## 2018-12-24 DIAGNOSIS — E7800 Pure hypercholesterolemia, unspecified: Secondary | ICD-10-CM

## 2018-12-24 DIAGNOSIS — Z0000 Encounter for general adult medical examination without abnormal findings: Secondary | ICD-10-CM | POA: Diagnosis not present

## 2018-12-24 DIAGNOSIS — Z23 Encounter for immunization: Secondary | ICD-10-CM

## 2018-12-24 DIAGNOSIS — M8589 Other specified disorders of bone density and structure, multiple sites: Secondary | ICD-10-CM | POA: Diagnosis not present

## 2018-12-24 DIAGNOSIS — E559 Vitamin D deficiency, unspecified: Secondary | ICD-10-CM | POA: Diagnosis not present

## 2018-12-24 MED ORDER — ACYCLOVIR 5 % EX CREA: 1.0000 "application " | TOPICAL_CREAM | Freq: Every day | CUTANEOUS | 5 refills | Status: DC | PRN

## 2018-12-24 NOTE — Progress Notes (Signed)
Subjective:    Patient ID: [name] [name], female    DOB: 1951-03-10, 68 y.o.   MRN: [id number]  HPI Here for amw and annual health mt exam /rev of chronic medical problems   Feeling ok for the most part More back pain lately /hard to use handles on exercise machine  Keeps a little low grade cough -if fan or air in face or eating   I have personally reviewed the Medicare Annual Wellness questionnaire and have noted 1. The patient's medical and social history 2. Their use of alcohol, tobacco or illicit drugs 3. Their current medications and supplements 4. The patient's functional ability including ADL's, fall risks, home safety risks and hearing or visual             impairment. 5. Diet and physical activities 6. Evidence for depression or mood disorders  The patients weight, height, BMI have been recorded in the chart and visual acuity is per eye clinic.  I have made referrals, counseling and provided education to the patient based review of the above and I have provided the pt with a written personalized care plan for preventive services. Reviewed and updated provider list, see scanned forms.  See scanned forms.  Routine anticipatory guidance given to patient.  See health maintenance. Colon cancer screening colonoscopy 8/16 -10 y recall  Breast cancer screening mammogram 11/19  Self breast exam-no lumps or changes    (saw Dr Phineas Real last year)-still goes yearly  Flu vaccine-did not get one  Tetanus vaccine 3/18  Pneumovax had prevnar a year ago  Zoster vaccine 2015 dexa 10/17 (gyn) osteopenia - gets at Dr Zelphia [place] office -will f/u in the fall  D level is 44 Exercising regularly  No falls  No fractures   Advance directive- does not have  Cognitive function addressed- see scanned forms- and if abnormal then additional documentation follows.  No changes  Does walk into a room and forget why Does not get confused or lost   PMH and SH reviewed  Meds, vitals, and  allergies reviewed.   ROS: See HPI.  Otherwise negative.    Wt Readings from Last 3 Encounters:  12/24/18 181 lb 4 oz (82.2 kg)  04/30/18 181 lb (82.1 kg)  02/09/18 179 lb (81.2 kg)   30.63 kg/m      Hearing Screening   125Hz  250Hz  500Hz  1000Hz  2000Hz  3000Hz  4000Hz  6000Hz  8000Hz   Right ear:   40 40 40  40    Left ear:   40 40 40  40    Vision Screening Comments: Pt has yearly eye exam with Dr. [place], next eye exam is on 12/28/18  BP Readings from Last 3 Encounters:  12/24/18 104/70  04/30/18 120/76  02/09/18 116/78    Cholesterol Lab Results  Component Value Date   CHOL 186 12/17/2018   CHOL 172 12/12/2017   CHOL 170 12/11/2016   Lab Results  Component Value Date   HDL 55.30 12/17/2018   HDL 53.70 12/12/2017   HDL 51.10 12/11/2016   Lab Results  Component Value Date   LDLCALC 110 (H) 12/17/2018   LDLCALC 96 12/12/2017   LDLCALC 105 (H) 12/11/2016   Lab Results  Component Value Date   TRIG 107.0 12/17/2018   TRIG 109.0 12/12/2017   TRIG 69.0 12/11/2016   Lab Results  Component Value Date   CHOLHDL 3 12/17/2018   CHOLHDL 3 12/12/2017   CHOLHDL 3 12/11/2016   Lab Results  Component Value Date  LDLDIRECT 124.4 09/12/2010   LDL is up a bit  Drinking protein drink with cholesterol   May look at other brands  occ bacon -once per week  Beef once per week  Does not fry foods  No shellfish  Eats salmon and white fish   Lab Results  Component Value Date   CREATININE 0.84 12/17/2018   BUN 22 12/17/2018   NA 139 12/17/2018   K 4.5 12/17/2018   CL 104 12/17/2018   CO2 27 12/17/2018   Lab Results  Component Value Date   ALT 21 12/17/2018   AST 23 12/17/2018   ALKPHOS 79 12/17/2018   BILITOT 0.7 12/17/2018    Lab Results  Component Value Date   WBC 4.7 12/17/2018   HGB 14.9 12/17/2018   HCT 43.3 12/17/2018   MCV 94.5 12/17/2018   PLT 194.0 12/17/2018   Lab Results  Component Value Date   TSH 1.37 12/17/2018     Patient Active Problem  List   Diagnosis Date Noted  . Welcome to Medicare preventive visit 12/19/2017  . Cough 12/19/2017  . Anxiety 09/24/2017  . Sleep disorder 09/12/2017  . Urinary frequency 05/02/2017  . Palpitations 05/22/2015  . OA (osteoarthritis) of knee 11/21/2014  . Colon cancer screening 11/29/2013  . Routine general medical examination at a health care facility 09/16/2011  . DERMATITIS, ATOPIC 11/27/2010  . URTICARIA DUE TO COLD OR HEAT 11/27/2010  . STRESS REACTION, ACUTE, WITH EMOTIONAL DISTURBANCE 09/14/2010  . THROAT PAIN, CHRONIC 09/14/2010  . EXTERNAL HEMORRHOIDS WITHOUT MENTION COMP 06/25/2010  . ABNORMAL EKG 12/26/2009  . OTHER CHRONIC SINUSITIS 10/19/2009  . Vitamin D deficiency 04/21/2009  . ALLERGIC RHINITIS 11/09/2008  . GERD 07/28/2008  . Osteopenia 03/14/2008  . HYPERCHOLESTEROLEMIA, PURE 03/13/2007  . HIATAL HERNIA 01/12/2007   Past Medical History:  Diagnosis Date  . Acute meniscal tear of knee   . Arthritis    "neck; left knee before replacement" (05/10/2015)  . Basal cell cancer 5/08  . Breast cyst    "left"  . Chest pain 9/11   [facility]) and palpitations - ruled out for MI with nl. Echo  . Complication of anesthesia HARD TO WAKE  . Difficulty sleeping   . Eczema   . GERD (gastroesophageal reflux disease)   . Heart palpitations OCCASIONAL--  TAKES ATENOLOL PRN  . Hiatal hernia   . HSV-2 infection    buttocks  . Hyperlipidemia DIET CONTROL  . Melanoma of skin ([place])   . MVP (mitral valve prolapse)    per pt no treatment needed  . Normal nuclear stress test 07-09-10  . Osteopenia 12/2013   T score -1.6 FRAX 16%/0.8%  . PONV (postoperative nausea and vomiting)    Past Surgical History:  Procedure Laterality Date  . Carotid US  9/05   Mild, recheck 1 year  . Carotid US  10/06   Normal  . CHONDROPLASTY  10/23/2011   Procedure: CHONDROPLASTY;  Surgeon: Gearlean Alf, MD;  Location[facility];  Service: Orthopedics;  Laterality: Left;  Left  medial   . COLONOSCOPY    . ESOPHAGOGASTRODUODENOSCOPY  9/07   Normal  . HYSTEROSCOPY WITH RESECTOSCOPE  2000   POLPECTOMY'S  . JOINT REPLACEMENT    . KNEE ARTHROSCOPY  10/23/2011   Procedure: ARTHROSCOPY KNEE;  Surgeon: Gearlean Alf, MD;  Location[facility];  Service: Orthopedics;  Laterality: Left;  LEFT KNEE SCOPE WITH DEBRIDEMENT   . MELANOMA EXCISION Right 1995   "  side"  . MOHS SURGERY Left    "side of my nose"  . MOLE REMOVAL     "several; all over my body"  . MRI neck  3/00   C4-C5 herniation small stenosis mild C4-5, C5-6  . TONSILLECTOMY    . TOTAL KNEE ARTHROPLASTY Left 11/21/2014   Procedure: LEFT TOTAL KNEE ARTHROPLASTY;  Surgeon: Gearlean Alf, MD;  Location: [facility];  Service: Orthopedics;  Laterality: Left;  . TRANSTHORACIC ECHOCARDIOGRAM  06-29-2010   NORMAL LVSF, EF 55-60%,  MILD MR  . TUBAL LIGATION  1989  . VAGINAL HYSTERECTOMY  2001   complex hyperplasia with mother's history of uterine cancer   Social History   Tobacco Use  . Smoking status: Former Smoker    Packs/day: 1.00    Years: 15.00    Pack years: 15.00    Types: Cigarettes    Last attempt to quit: 10/07/1978    Years since quitting: 40.2  . Smokeless tobacco: Never Used  Substance Use Topics  . Alcohol use: No    Alcohol/week: 0.0 standard drinks  . Drug use: No   Family History  Problem Relation Age of Onset  . Cancer Mother        uterine  . Obesity Mother   . Thrombocytopenia Mother   . Diabetes Mother   . Osteoarthritis Mother   . Alcohol abuse Father   . Heart disease Father        CAD in 85's  . Emphysema Father   . Diabetes Maternal Grandmother        type 2  . Heart disease Paternal Aunt        CAD  . Heart disease Paternal Uncle        CAD  . Osteoarthritis Sister   . Osteoarthritis Maternal Aunt   . Colon cancer Neg Hx   . Breast cancer Neg Hx    Allergies  Allergen Reactions  . Amoxicillin Rash  . Codeine Rash  . Macrobid [Nitrofurantoin]      GI issues  . Clarithromycin Other (See Comments)    REACTION: reaction not known  . Trazodone And Nefazodone Palpitations   Current Outpatient Medications on File Prior to Visit  Medication Sig Dispense Refill  . acyclovir (ZOVIRAX) 200 MG capsule Take 1 capsule (200 mg total) by mouth daily as needed. 60 capsule 1  . Ascorbic Acid (VITAMIN C PO) Take 1 tablet by mouth daily.    Marland Kitchen aspirin EC 81 MG tablet Take 1 tablet (81 mg total) by mouth daily.    Marland Kitchen atenolol (TENORMIN) 25 MG tablet Take 0.5 tablets (12.5 mg total) by mouth daily as needed (palpatations). 30 tablet 5  . CALCIUM CARBONATE PO Take 1 tablet by mouth daily.    . Cholecalciferol (VITAMIN D PO) Take 1 tablet by mouth daily.    Marland Kitchen CINNAMON PO Take 1 capsule by mouth. Takes occasionally    . CYANOCOBALAMIN PO Take 1 tablet by mouth daily.    . diclofenac sodium (VOLTAREN) 1 % GEL APPLY 4 GRAMS TO THE AFFECTED AREA 3 TIMES DAILY AS NEEDED 1 Tube 3  . escitalopram (LEXAPRO) 10 MG tablet Take 0.5 tablets (5 mg total) by mouth daily. 45 tablet 3  . KRILL OIL PO Take by mouth. Takes 1 tab occasionally    . magnesium gluconate (MAGONATE) 500 MG tablet Take 500 mg by mouth daily.    . Multiple Vitamin (MULTIVITAMIN) capsule Take 1 capsule by mouth daily.    Marland Kitchen  Polyethyl Glycol-Propyl Glycol (SYSTANE OP) Apply 1 drop to eye daily as needed (Dry eyes).     Current Facility-Administered Medications on File Prior to Visit  Medication Dose Route Frequency Provider Last Rate Last Dose  . gi cocktail (Maalox,Lidocaine,Donnatal)  30 mL Oral Once Briscoe Deutscher, DO        Review of Systems  Constitutional: Negative for activity change, appetite change, fatigue, fever and unexpected weight change.  HENT: Negative for congestion, ear pain, rhinorrhea, sinus pressure and sore throat.        Some rhinorrhea/pnd when she eats  Eyes: Negative for pain, redness and visual disturbance.  Respiratory: Positive for cough. Negative for shortness of  breath and wheezing.        Coughs occ when she eats   Cardiovascular: Negative for chest pain and palpitations.  Gastrointestinal: Negative for abdominal pain, blood in stool, constipation and diarrhea.  Endocrine: Negative for polydipsia and polyuria.  Genitourinary: Negative for dysuria, frequency and urgency.  Musculoskeletal: Negative for arthralgias, back pain and myalgias.  Skin: Negative for pallor and rash.  Allergic/Immunologic: Negative for environmental allergies.  Neurological: Negative for dizziness, syncope and headaches.  Hematological: Negative for adenopathy. Does not bruise/bleed easily.  Psychiatric/Behavioral: Negative for decreased concentration and dysphoric mood. The patient is not nervous/anxious.        Objective:   Physical Exam Constitutional:      General: She is not in acute distress.    Appearance: Normal appearance. She is well-developed. She is obese. She is not ill-appearing.  HENT:     Head: Normocephalic and atraumatic.     Right Ear: Tympanic membrane, ear canal and external ear normal.     Left Ear: Tympanic membrane, ear canal and external ear normal.     Nose: Nose normal.     Mouth/Throat:     Mouth: Mucous membranes are moist.     Pharynx: Oropharynx is clear.  Eyes:     General: No scleral icterus.       Right eye: No discharge.        Left eye: No discharge.     Conjunctiva/sclera: Conjunctivae normal.     Pupils: Pupils are equal, round, and reactive to light.  Neck:     Musculoskeletal: Normal range of motion and neck supple.     Thyroid: No thyromegaly.     Vascular: No carotid bruit or JVD.  Cardiovascular:     Rate and Rhythm: Normal rate and regular rhythm.     Heart sounds: Normal heart sounds. No gallop.   Pulmonary:     Effort: Pulmonary effort is normal. No respiratory distress.     Breath sounds: Normal breath sounds. No wheezing or rales.  Abdominal:     General: Bowel sounds are normal. There is no distension.      Palpations: Abdomen is soft. There is no mass.     Tenderness: There is no abdominal tenderness.  Musculoskeletal:        General: No tenderness.     Right lower leg: No edema.     Left lower leg: No edema.  Lymphadenopathy:     Cervical: No cervical adenopathy.  Skin:    General: Skin is warm and dry.     Coloration: Skin is not pale.     Findings: No erythema or rash.     Comments: Solar lentigines diffusely   Neurological:     Mental Status: She is alert.     Cranial Nerves: No cranial  nerve deficit.     Motor: No abnormal muscle tone.     Coordination: Coordination normal.     Deep Tendon Reflexes: Reflexes are normal and symmetric. Reflexes normal.  Psychiatric:        Mood and Affect: Mood normal.           Assessment & Plan:   Problem List Items Addressed This Visit      Musculoskeletal and Integument   Osteopenia    Will get bone density at gyn  Ca and D Regular exercise  No falls or fractures         Other   Vitamin D deficiency    Vitamin D level is therapeutic with current supplementation Disc importance of this to bone and overall health  Level of 49      HYPERCHOLESTEROLEMIA, PURE    Disc goals for lipids and reasons to control them Rev last labs with pt Rev low sat fat diet in detail LDL up a bit       Routine general medical examination at a health care facility    Reviewed health habits including diet and exercise and skin cancer prevention Reviewed appropriate screening tests for age  Also reviewed health mt list, fam hx and immunization status , as well as social and family history   See HPI Labs rev Enc low cholesterol diet and exercise  PNA vaccine 23 today  Did not get flu shot  Given materials for adv directive Reassuring hearing/vision /cog status        Relevant Orders   Pneumococcal polysaccharide vaccine 23-valent greater than or equal to 2yo subcutaneous/IM (Completed)   Medicare annual wellness visit, initial -  Primary    Reviewed health habits including diet and exercise and skin cancer prevention Reviewed appropriate screening tests for age  Also reviewed health mt list, fam hx and immunization status , as well as social and family history   See HPI Labs rev Enc low cholesterol diet and exercise  PNA vaccine 23 today  Did not get flu shot  Given materials for adv directive Reassuring hearing/vision /cog status         Other Visit Diagnoses    Need for 23-polyvalent pneumococcal polysaccharide vaccine       Relevant Orders   Pneumococcal polysaccharide vaccine 23-valent greater than or equal to 2yo subcutaneous/IM (Completed)

## 2018-12-24 NOTE — Patient Instructions (Addendum)
Work on an Forensic scientist  Look at the blue packet- fill it out with family / get it notarized   For cholesterol  Avoid red meat/ fried foods/ egg yolks/ fatty breakfast meats/ butter, cheese and high fat dairy/ and shellfish    Pneumonia vaccine today   Take care of yourself

## 2018-12-27 NOTE — Assessment & Plan Note (Signed)
Reviewed health habits including diet and exercise and skin cancer prevention Reviewed appropriate screening tests for age  Also reviewed health mt list, fam hx and immunization status , as well as social and family history   See HPI Labs rev Enc low cholesterol diet and exercise  PNA vaccine 23 today  Did not get flu shot  Given materials for adv directive Reassuring hearing/vision /cog status

## 2018-12-27 NOTE — Assessment & Plan Note (Signed)
Vitamin D level is therapeutic with current supplementation Disc importance of this to bone and overall health Level of 49  

## 2018-12-27 NOTE — Assessment & Plan Note (Signed)
Disc goals for lipids and reasons to control them Rev last labs with pt Rev low sat fat diet in detail LDL up a bit

## 2018-12-27 NOTE — Assessment & Plan Note (Signed)
Will get bone density at gyn  Ca and D Regular exercise  No falls or fractures

## 2019-01-06 ENCOUNTER — Encounter: Payer: Self-pay | Admitting: Family Medicine

## 2019-01-06 DIAGNOSIS — W19XXXA Unspecified fall, initial encounter: Secondary | ICD-10-CM | POA: Diagnosis not present

## 2019-01-06 DIAGNOSIS — M1990 Unspecified osteoarthritis, unspecified site: Secondary | ICD-10-CM | POA: Diagnosis not present

## 2019-01-06 DIAGNOSIS — I10 Essential (primary) hypertension: Secondary | ICD-10-CM | POA: Diagnosis not present

## 2019-01-06 DIAGNOSIS — N3000 Acute cystitis without hematuria: Secondary | ICD-10-CM | POA: Diagnosis not present

## 2019-01-06 DIAGNOSIS — E1165 Type 2 diabetes mellitus with hyperglycemia: Secondary | ICD-10-CM | POA: Diagnosis not present

## 2019-01-06 DIAGNOSIS — L814 Other melanin hyperpigmentation: Secondary | ICD-10-CM | POA: Diagnosis not present

## 2019-01-06 DIAGNOSIS — D485 Neoplasm of uncertain behavior of skin: Secondary | ICD-10-CM | POA: Diagnosis not present

## 2019-01-06 DIAGNOSIS — E1142 Type 2 diabetes mellitus with diabetic polyneuropathy: Secondary | ICD-10-CM | POA: Diagnosis not present

## 2019-01-06 DIAGNOSIS — M25561 Pain in right knee: Secondary | ICD-10-CM | POA: Diagnosis not present

## 2019-01-06 DIAGNOSIS — Z111 Encounter for screening for respiratory tuberculosis: Secondary | ICD-10-CM | POA: Diagnosis not present

## 2019-01-06 DIAGNOSIS — H532 Diplopia: Secondary | ICD-10-CM | POA: Diagnosis not present

## 2019-01-06 DIAGNOSIS — E785 Hyperlipidemia, unspecified: Secondary | ICD-10-CM | POA: Diagnosis not present

## 2019-01-06 DIAGNOSIS — Z85828 Personal history of other malignant neoplasm of skin: Secondary | ICD-10-CM | POA: Diagnosis not present

## 2019-01-06 DIAGNOSIS — R0902 Hypoxemia: Secondary | ICD-10-CM | POA: Diagnosis not present

## 2019-01-06 DIAGNOSIS — R52 Pain, unspecified: Secondary | ICD-10-CM | POA: Diagnosis not present

## 2019-01-06 DIAGNOSIS — L821 Other seborrheic keratosis: Secondary | ICD-10-CM | POA: Diagnosis not present

## 2019-01-06 DIAGNOSIS — L570 Actinic keratosis: Secondary | ICD-10-CM | POA: Diagnosis not present

## 2019-01-06 DIAGNOSIS — R404 Transient alteration of awareness: Secondary | ICD-10-CM | POA: Diagnosis not present

## 2019-01-06 DIAGNOSIS — I700 Atherosclerosis of aorta: Secondary | ICD-10-CM | POA: Diagnosis not present

## 2019-01-06 DIAGNOSIS — Z712 Person consulting for explanation of examination or test findings: Secondary | ICD-10-CM | POA: Diagnosis not present

## 2019-01-10 ENCOUNTER — Other Ambulatory Visit: Payer: Self-pay | Admitting: Family Medicine

## 2019-02-03 ENCOUNTER — Other Ambulatory Visit: Payer: Self-pay | Admitting: Family Medicine

## 2019-02-04 NOTE — Telephone Encounter (Signed)
Last OV 12/24/2018  Last refilled 12/19/2017 disp 45 with 3 refills   Next OV none scheduled Rx sent

## 2019-02-10 DIAGNOSIS — Z85828 Personal history of other malignant neoplasm of skin: Secondary | ICD-10-CM | POA: Diagnosis not present

## 2019-02-10 DIAGNOSIS — L570 Actinic keratosis: Secondary | ICD-10-CM | POA: Diagnosis not present

## 2019-02-10 DIAGNOSIS — Z85820 Personal history of malignant melanoma of skin: Secondary | ICD-10-CM | POA: Diagnosis not present

## 2019-02-10 DIAGNOSIS — L821 Other seborrheic keratosis: Secondary | ICD-10-CM | POA: Diagnosis not present

## 2019-02-10 DIAGNOSIS — D225 Melanocytic nevi of trunk: Secondary | ICD-10-CM | POA: Diagnosis not present

## 2019-02-10 DIAGNOSIS — D1801 Hemangioma of skin and subcutaneous tissue: Secondary | ICD-10-CM | POA: Diagnosis not present

## 2019-02-10 DIAGNOSIS — D2271 Melanocytic nevi of right lower limb, including hip: Secondary | ICD-10-CM | POA: Diagnosis not present

## 2019-02-10 DIAGNOSIS — L814 Other melanin hyperpigmentation: Secondary | ICD-10-CM | POA: Diagnosis not present

## 2019-06-02 ENCOUNTER — Other Ambulatory Visit: Payer: Self-pay | Admitting: *Deleted

## 2019-06-02 MED ORDER — ACYCLOVIR 200 MG PO CAPS: 200.0000 mg | ORAL_CAPSULE | Freq: Every day | ORAL | 1 refills | Status: DC | PRN

## 2019-06-25 ENCOUNTER — Other Ambulatory Visit: Payer: Self-pay | Admitting: Family Medicine

## 2019-07-01 DIAGNOSIS — L820 Inflamed seborrheic keratosis: Secondary | ICD-10-CM | POA: Diagnosis not present

## 2019-07-01 DIAGNOSIS — Z85828 Personal history of other malignant neoplasm of skin: Secondary | ICD-10-CM | POA: Diagnosis not present

## 2019-07-01 DIAGNOSIS — B351 Tinea unguium: Secondary | ICD-10-CM | POA: Diagnosis not present

## 2019-07-01 DIAGNOSIS — B078 Other viral warts: Secondary | ICD-10-CM | POA: Diagnosis not present

## 2019-07-14 ENCOUNTER — Encounter: Payer: Self-pay | Admitting: Gynecology

## 2019-07-19 ENCOUNTER — Other Ambulatory Visit: Payer: Self-pay | Admitting: Gynecology

## 2019-07-19 DIAGNOSIS — Z1231 Encounter for screening mammogram for malignant neoplasm of breast: Secondary | ICD-10-CM

## 2019-07-26 ENCOUNTER — Ambulatory Visit (INDEPENDENT_AMBULATORY_CARE_PROVIDER_SITE_OTHER): Payer: PPO | Admitting: Primary Care

## 2019-07-26 ENCOUNTER — Other Ambulatory Visit: Payer: Self-pay

## 2019-07-26 VITALS — BP 114/72 | HR 78 | Temp 97.8°F | Ht 64.5 in | Wt 184.0 lb

## 2019-07-26 DIAGNOSIS — R350 Frequency of micturition: Secondary | ICD-10-CM

## 2019-07-26 LAB — POC URINALSYSI DIPSTICK (AUTOMATED)
Bilirubin, UA: NEGATIVE
Glucose, UA: NEGATIVE
Ketones, UA: NEGATIVE
Nitrite, UA: NEGATIVE
Protein, UA: NEGATIVE
Spec Grav, UA: 1.02 (ref 1.010–1.025)
Urobilinogen, UA: 0.2 E.U./dL
pH, UA: 5.5 (ref 5.0–8.0)

## 2019-07-26 MED ORDER — SULFAMETHOXAZOLE-TRIMETHOPRIM 800-160 MG PO TABS: 1.0000 | ORAL_TABLET | Freq: Two times a day (BID) | ORAL | 0 refills | Status: DC

## 2019-07-26 NOTE — Patient Instructions (Signed)
Start Bactrim DS (sulfamethoxazole/trimethoprim) tablets for urinary tract infection. Take 1 tablet by mouth twice daily for 5 days.  Continue to push intake of water.  It was a pleasure meeting you!

## 2019-07-26 NOTE — Progress Notes (Signed)
Subjective:    Patient ID: [name] [name], female    DOB: [birth year], 68 y.o.   MRN: [id number]  HPI  [name] [name] is a 68 year old female with a history of GERD, hiatal hernia, acute cystitis who presents today with a chief complaint of urinary frequency.   She also reports dysuria. Symptoms began several days ago. She denies vaginal symptoms, hematuria, fevers, abdominal pain. She's not taken anything OTC for her symptoms.   Review of Systems  Constitutional: Negative for fever.  Gastrointestinal: Negative for abdominal pain.  Genitourinary: Positive for dysuria and frequency. Negative for flank pain, hematuria and vaginal discharge.       Past Medical History:  Diagnosis Date  . Acute meniscal tear of knee   . Arthritis    "neck; left knee before replacement" (05/10/2015)  . Basal cell cancer 5/08  . Breast cyst    "left"  . Chest pain 9/11   [facility]) and palpitations - ruled out for MI with nl. Echo  . Complication of anesthesia HARD TO WAKE  . Difficulty sleeping   . Eczema   . GERD (gastroesophageal reflux disease)   . Heart palpitations OCCASIONAL--  TAKES ATENOLOL PRN  . Hiatal hernia   . HSV-2 infection    buttocks  . Hyperlipidemia DIET CONTROL  . Melanoma of skin ([place])   . MVP (mitral valve prolapse)    per pt no treatment needed  . Normal nuclear stress test 07-09-10  . Osteopenia 12/2013   T score -1.6 FRAX 16%/0.8%  . PONV (postoperative nausea and vomiting)      Social History   Socioeconomic History  . Marital status: Married    Spouse name: Not on file  . Number of children: Not on file  . Years of education: Not on file  . Highest education level: Not on file  Occupational History  . Not on file  Social Needs  . Financial resource strain: Not on file  . Food insecurity    Worry: Not on file    Inability: Not on file  . Transportation needs    Medical: Not on file    Non-medical: Not on file  Tobacco Use  . Smoking status: Former Smoker    Packs/day: 1.00    Years: 15.00    Pack years: 15.00    Types: Cigarettes    Quit date: 10/07/1978    Years since quitting: 40.8  . Smokeless tobacco: Never Used  Substance and Sexual Activity  . Alcohol use: No    Alcohol/week: 0.0 standard drinks  . Drug use: No  . Sexual activity: Yes    Birth control/protection: Post-menopausal, Surgical    Comment: HYST-1st intercourse 68 yo-Fewer than 5 partners  Lifestyle  . Physical activity    Days per week: Not on file    Minutes per session: Not on file  . Stress: Not on file  Relationships  . Social Herbalist on phone: Not on file    Gets together: Not on file    Attends religious service: Not on file    Active member of club or organization: Not on file    Attends meetings of clubs or organizations: Not on file    Relationship status: Not on file  . Intimate partner violence    Fear of current or ex partner: Not on file    Emotionally abused: Not on file    Physically abused: Not on file    Forced sexual  activity: Not on file  Other Topics Concern  . Not on file  Social History Narrative   Daily caffeine use.   Patient gets regular exercise.    Past Surgical History:  Procedure Laterality Date  . Carotid US  9/05   Mild, recheck 1 year  . Carotid US  10/06   Normal  . CHONDROPLASTY  10/23/2011   Procedure: CHONDROPLASTY;  Surgeon: Gearlean Alf, MD;  Location: [facility];  Service: Orthopedics;  Laterality: Left;  Left medial   . COLONOSCOPY    . ESOPHAGOGASTRODUODENOSCOPY  9/07   Normal  . HYSTEROSCOPY WITH RESECTOSCOPE  2000   POLPECTOMY'S  . JOINT REPLACEMENT    . KNEE ARTHROSCOPY  10/23/2011   Procedure: ARTHROSCOPY KNEE;  Surgeon: Gearlean Alf, MD;  Location: [facility];  Service: Orthopedics;  Laterality: Left;  LEFT KNEE SCOPE WITH DEBRIDEMENT   . MELANOMA EXCISION Right 1995   "side"  . MOHS SURGERY Left    "side of my nose"  . MOLE REMOVAL     "several; all  over my body"  . MRI neck  3/00   C4-C5 herniation small stenosis mild C4-5, C5-6  . TONSILLECTOMY    . TOTAL KNEE ARTHROPLASTY Left 11/21/2014   Procedure: LEFT TOTAL KNEE ARTHROPLASTY;  Surgeon: Gearlean Alf, MD;  Location: [facility];  Service: Orthopedics;  Laterality: Left;  . TRANSTHORACIC ECHOCARDIOGRAM  06-29-2010   NORMAL LVSF, EF 55-60%,  MILD MR  . TUBAL LIGATION  1989  . VAGINAL HYSTERECTOMY  2001   complex hyperplasia with mother's history of uterine cancer    Family History  Problem Relation Age of Onset  . Cancer Mother        uterine  . Obesity Mother   . Thrombocytopenia Mother   . Diabetes Mother   . Osteoarthritis Mother   . Alcohol abuse Father   . Heart disease Father        CAD in 64's  . Emphysema Father   . Diabetes Maternal Grandmother        type 2  . Heart disease Paternal Aunt        CAD  . Heart disease Paternal Uncle        CAD  . Osteoarthritis Sister   . Osteoarthritis Maternal Aunt   . Colon cancer Neg Hx   . Breast cancer Neg Hx     Allergies  Allergen Reactions  . Amoxicillin Rash  . Codeine Rash  . Macrobid [Nitrofurantoin]     GI issues  . Clarithromycin Other (See Comments)    REACTION: reaction not known  . Trazodone And Nefazodone Palpitations    Current Outpatient Medications on File Prior to Visit  Medication Sig Dispense Refill  . acyclovir (ZOVIRAX) 200 MG capsule Take 1 capsule (200 mg total) by mouth daily as needed. 60 capsule 1  . acyclovir cream (ZOVIRAX) 5 % Apply 1 application topically daily as needed. 5 g 5  . Ascorbic Acid (VITAMIN C PO) Take 1 tablet by mouth daily.    Marland Kitchen aspirin EC 81 MG tablet Take 1 tablet (81 mg total) by mouth daily.    Marland Kitchen atenolol (TENORMIN) 25 MG tablet Take 0.5 tablets (12.5 mg total) by mouth daily as needed (palpatations). 30 tablet 5  . CALCIUM CARBONATE PO Take 1 tablet by mouth daily.    . Cholecalciferol (VITAMIN D PO) Take 1 tablet by mouth daily.    Marland Kitchen CINNAMON PO Take 1 capsule  by mouth. Takes occasionally    . CYANOCOBALAMIN PO Take 1 tablet by mouth daily.    . diclofenac sodium (VOLTAREN) 1 % GEL APPLY 4 GRAMS TO THE AFFECTED AREA 3 TIMES DAILY AS NEEDED 100 g 3  . escitalopram (LEXAPRO) 10 MG tablet TAKE 0.5 TABLETS (5 MG TOTAL) BY MOUTH DAILY. 45 tablet 3  . KRILL OIL PO Take by mouth. Takes 1 tab occasionally    . magnesium gluconate (MAGONATE) 500 MG tablet Take 500 mg by mouth daily.    . Multiple Vitamin (MULTIVITAMIN) capsule Take 1 capsule by mouth daily.    Vladimir Faster Glycol-Propyl Glycol (SYSTANE OP) Apply 1 drop to eye daily as needed (Dry eyes).     Current Facility-Administered Medications on File Prior to Visit  Medication Dose Route Frequency Provider Last Rate Last Dose  . gi cocktail (Maalox,Lidocaine,Donnatal)  30 mL Oral Once Briscoe Deutscher, DO        BP 114/72   Pulse 78   Temp 97.8 F (36.6 C) (Temporal)   Ht 5' 4.5" (1.638 m)   Wt 184 lb (83.5 kg)   SpO2 97%   BMI 31.10 kg/m    Objective:   Physical Exam  Constitutional: She appears well-nourished.  Neck: Neck supple.  Cardiovascular: Normal rate and regular rhythm.  Respiratory: Effort normal and breath sounds normal.  GI: Soft. Bowel sounds are normal. There is no abdominal tenderness.  Skin: Skin is warm and dry.           Assessment & Plan:

## 2019-07-26 NOTE — Assessment & Plan Note (Signed)
Acute for the last several days. UA today with 3+ leuks, 1+ blood. Culture sent.  Given UA results coupled with symptoms we will proceed with treatment. Rx for Bactrim course sent to pharmacy.  Push water intake. Follow up PRN.

## 2019-07-29 LAB — URINE CULTURE
MICRO NUMBER:: 1004135
SPECIMEN QUALITY:: ADEQUATE

## 2019-09-07 ENCOUNTER — Ambulatory Visit
Admission: RE | Admit: 2019-09-07 | Discharge: 2019-09-07 | Disposition: A | Discharge status: Home / Self Care | Payer: PPO | Source: Ambulatory Visit | Attending: Gynecology | Admitting: Gynecology

## 2019-09-07 ENCOUNTER — Other Ambulatory Visit: Payer: Self-pay

## 2019-09-07 DIAGNOSIS — Z1231 Encounter for screening mammogram for malignant neoplasm of breast: Secondary | ICD-10-CM | POA: Diagnosis not present

## 2019-09-09 ENCOUNTER — Other Ambulatory Visit: Payer: Self-pay

## 2019-09-10 ENCOUNTER — Encounter: Payer: Self-pay | Admitting: Gynecology

## 2019-09-10 ENCOUNTER — Ambulatory Visit (INDEPENDENT_AMBULATORY_CARE_PROVIDER_SITE_OTHER): Payer: PPO | Admitting: Gynecology

## 2019-09-10 VITALS — BP 122/78 | Ht 65.0 in | Wt 181.0 lb

## 2019-09-10 DIAGNOSIS — Z9289 Personal history of other medical treatment: Secondary | ICD-10-CM

## 2019-09-10 DIAGNOSIS — Z9189 Other specified personal risk factors, not elsewhere classified: Secondary | ICD-10-CM | POA: Diagnosis not present

## 2019-09-10 DIAGNOSIS — N952 Postmenopausal atrophic vaginitis: Secondary | ICD-10-CM

## 2019-09-10 DIAGNOSIS — Z01419 Encounter for gynecological examination (general) (routine) without abnormal findings: Secondary | ICD-10-CM | POA: Diagnosis not present

## 2019-09-10 DIAGNOSIS — M8580 Other specified disorders of bone density and structure, unspecified site: Secondary | ICD-10-CM

## 2019-09-10 NOTE — Patient Instructions (Signed)
Follow-up for bone density as scheduled.  Follow-up in 1 year for annual exam. 

## 2019-09-10 NOTE — Progress Notes (Signed)
    ROCQUEL DAPPER 1951-03-08 [id number]        68 y.o.  G3P3 for breast and pelvic exam.  Without gynecologic complaints  Past medical history,surgical history, problem list, medications, allergies, family history and social history were all reviewed and documented as reviewed in the EPIC chart.  ROS:  Performed with pertinent positives and negatives included in the history, assessment and plan.   Additional significant findings : None   Exam: Caryn Bee assistant Vitals:   09/10/19 1201  BP: 122/78  Weight: 181 lb (82.1 kg)  Height: 5\' 5"  (1.651 m)   Body mass index is 30.12 kg/m.  General appearance:  Normal affect, orientation and appearance. Skin: Grossly normal HEENT: Without gross lesions.  No cervical or supraclavicular adenopathy. Thyroid normal.  Lungs:  Clear without wheezing, rales or rhonchi Cardiac: RR, without RMG Abdominal:  Soft, nontender, without masses, guarding, rebound, organomegaly or hernia Breasts:  Examined lying and sitting without masses, retractions, discharge or axillary adenopathy. Pelvic:  Ext, BUS, Vagina: With atrophic changes  Adnexa: Without masses or tenderness    Anus and perineum: Normal   Rectovaginal: Normal sphincter tone without palpated masses or tenderness.    Assessment/Plan:  68 y.o. G3P3 female for annual gynecologic exam.  Status post TVH for complex hyperplasia  1. Postmenopausal.  No significant menopausal symptoms. 2. Osteopenia.  DEXA 2017 T score -1.4 FRAX 8% / 0.9% recommend follow-up DEXA now and patient will schedule 3. Mammography several days ago.  Breast exam normal today.  Continue with annual mammography next year. 4. Colonoscopy 2016.  Repeat at their recommended interval. 5. Pap smear 2019.  No Pap smear done today.  No history of abnormal Pap smears.  Options to stop screening per current screening guidelines reviewed.  Will readdress on an annual basis. 6. Health maintenance.  No routine lab work done as  patient does this elsewhere.  Follow-up 1 year, sooner as needed.   Anastasio Auerbach MD, 12:21 PM 09/10/2019

## 2019-09-20 ENCOUNTER — Encounter: Payer: Self-pay | Admitting: Family Medicine

## 2019-09-20 ENCOUNTER — Other Ambulatory Visit: Payer: Self-pay

## 2019-09-20 ENCOUNTER — Ambulatory Visit (INDEPENDENT_AMBULATORY_CARE_PROVIDER_SITE_OTHER): Payer: PPO | Admitting: Family Medicine

## 2019-09-20 DIAGNOSIS — J0110 Acute frontal sinusitis, unspecified: Secondary | ICD-10-CM

## 2019-09-20 DIAGNOSIS — J019 Acute sinusitis, unspecified: Secondary | ICD-10-CM | POA: Insufficient documentation

## 2019-09-20 MED ORDER — DOXYCYCLINE HYCLATE 100 MG PO TABS: 100.0000 mg | ORAL_TABLET | Freq: Two times a day (BID) | ORAL | 0 refills | Status: DC

## 2019-09-20 NOTE — Assessment & Plan Note (Signed)
tx with doxycycline (pcn allergic)  Symptomatic care advised Aware she had neg covid testing-re assuring  Fluids/rest  Saline/steam may help  Continue expectorant plus DM Update if not starting to improve in a week or if worsening

## 2019-09-20 NOTE — Patient Instructions (Signed)
I think you have a sinus infection   Take the doxycycline as directed Drink fluids/rest Steam and nasal saline may help congestion   Continue flonase Robitussin DM or Mucinex DM are fine   Update if not starting to improve in a week or if worsening

## 2019-09-20 NOTE — Progress Notes (Signed)
Virtual Visit via Telephone Note  I connected with [name] [name] on 09/20/19 at  2:00 PM EST by telephone and verified that I am speaking with the correct person using two identifiers.  Location: Patient: home Provider: office    I discussed the limitations, risks, security and privacy concerns of performing an evaluation and management service by telephone and the availability of in person appointments. I also discussed with the patient that there may be a patient responsible charge related to this service. The patient expressed understanding and agreed to proceed.  Parties involved in encounter Patient: [name] [name] Provider: [name] MD  History of Present Illness: Pt presents with sinus symptoms and L ear pain   Symptoms for about a month   L ear hurts/pops  Facial pain -temple and R (headache) and above headache  Her L eye was running at the start of this Better now    Lots of pnd  Nasal congestion -cannot get it out  Mucous is yellow  Thick   Cough -thinks from pnd -coughs hard  Not deep  Not painful  Not productive    At first she had low grade fever- neg covid test then 3 wk ago Has funny smell in nose  Still has taste and smell    taking: mucinex  flonase nasal spray  Robitussin DM max - [employer]    Patient Active Problem List   Diagnosis Date Noted  . Acute sinusitis 09/20/2019  . Medicare annual wellness visit, initial 12/19/2017  . Cough 12/19/2017  . Anxiety 09/24/2017  . Sleep disorder 09/12/2017  . Urinary frequency 05/02/2017  . Palpitations 05/22/2015  . OA (osteoarthritis) of knee 11/21/2014  . Colon cancer screening 11/29/2013  . Routine general medical examination at a health care facility 09/16/2011  . DERMATITIS, ATOPIC 11/27/2010  . URTICARIA DUE TO COLD OR HEAT 11/27/2010  . STRESS REACTION, ACUTE, WITH EMOTIONAL DISTURBANCE 09/14/2010  . THROAT PAIN, CHRONIC 09/14/2010  . EXTERNAL HEMORRHOIDS WITHOUT MENTION COMP  06/25/2010  . ABNORMAL EKG 12/26/2009  . OTHER CHRONIC SINUSITIS 10/19/2009  . Vitamin D deficiency 04/21/2009  . ALLERGIC RHINITIS 11/09/2008  . GERD 07/28/2008  . Osteopenia 03/14/2008  . HYPERCHOLESTEROLEMIA, PURE 03/13/2007  . HIATAL HERNIA 01/12/2007   Past Medical History:  Diagnosis Date  . Acute meniscal tear of knee   . Arthritis    "neck; left knee before replacement" (05/10/2015)  . Basal cell cancer 5/08  . Breast cyst    "left"  . Chest pain 9/11   [facility]) and palpitations - ruled out for MI with nl. Echo  . Complication of anesthesia HARD TO WAKE  . Difficulty sleeping   . Eczema   . GERD (gastroesophageal reflux disease)   . Heart palpitations OCCASIONAL--  TAKES ATENOLOL PRN  . Hiatal hernia   . HSV-2 infection    buttocks  . Hyperlipidemia DIET CONTROL  . Melanoma of skin ([place])   . MVP (mitral valve prolapse)    per pt no treatment needed  . Normal nuclear stress test 07-09-10  . Osteopenia 12/2013   T score -1.6 FRAX 16%/0.8%  . PONV (postoperative nausea and vomiting)    Past Surgical History:  Procedure Laterality Date  . Carotid US  9/05   Mild, recheck 1 year  . Carotid US  10/06   Normal  . CHONDROPLASTY  10/23/2011   Procedure: CHONDROPLASTY;  Surgeon: Gearlean Alf, MD;  Location: [facility];  Service: Orthopedics;  Laterality: Left;  Left medial   . COLONOSCOPY    . ESOPHAGOGASTRODUODENOSCOPY  9/07   Normal  . HYSTEROSCOPY WITH RESECTOSCOPE  2000   POLPECTOMY'S  . JOINT REPLACEMENT    . KNEE ARTHROSCOPY  10/23/2011   Procedure: ARTHROSCOPY KNEE;  Surgeon: Gearlean Alf, MD;  Location: [facility];  Service: Orthopedics;  Laterality: Left;  LEFT KNEE SCOPE WITH DEBRIDEMENT   . MELANOMA EXCISION Right 1995   "side"  . MOHS SURGERY Left    "side of my nose"  . MOLE REMOVAL     "several; all over my body"  . MRI neck  3/00   C4-C5 herniation small stenosis mild C4-5, C5-6  . TONSILLECTOMY    . TOTAL  KNEE ARTHROPLASTY Left 11/21/2014   Procedure: LEFT TOTAL KNEE ARTHROPLASTY;  Surgeon: Gearlean Alf, MD;  Location: [facility];  Service: Orthopedics;  Laterality: Left;  . TRANSTHORACIC ECHOCARDIOGRAM  06-29-2010   NORMAL LVSF, EF 55-60%,  MILD MR  . TUBAL LIGATION  1989  . VAGINAL HYSTERECTOMY  2001   complex hyperplasia with mother's history of uterine cancer   Social History   Tobacco Use  . Smoking status: Former Smoker    Packs/day: 1.00    Years: 15.00    Pack years: 15.00    Types: Cigarettes    Quit date: 10/07/1978    Years since quitting: 40.9  . Smokeless tobacco: Never Used  Substance Use Topics  . Alcohol use: No    Alcohol/week: 0.0 standard drinks  . Drug use: No   Family History  Problem Relation Age of Onset  . Cancer Mother        uterine  . Obesity Mother   . Thrombocytopenia Mother   . Diabetes Mother   . Osteoarthritis Mother   . Alcohol abuse Father   . Heart disease Father        CAD in 33's  . Emphysema Father   . Diabetes Maternal Grandmother        type 2  . Heart disease Paternal Aunt        CAD  . Heart disease Paternal Uncle        CAD  . Osteoarthritis Sister   . Osteoarthritis Maternal Aunt   . Colon cancer Neg Hx   . Breast cancer Neg Hx    Allergies  Allergen Reactions  . Amoxicillin Rash  . Codeine Rash  . Macrobid [Nitrofurantoin]     GI issues  . Clarithromycin Other (See Comments)    REACTION: reaction not known  . Trazodone And Nefazodone Palpitations   Current Outpatient Medications on File Prior to Visit  Medication Sig Dispense Refill  . acyclovir (ZOVIRAX) 200 MG capsule Take 1 capsule (200 mg total) by mouth daily as needed. 60 capsule 1  . acyclovir cream (ZOVIRAX) 5 % Apply 1 application topically daily as needed. 5 g 5  . Ascorbic Acid (VITAMIN C PO) Take 1 tablet by mouth daily.    Marland Kitchen aspirin EC 81 MG tablet Take 1 tablet (81 mg total) by mouth daily. (Patient not taking: Reported on 09/10/2019)    . atenolol  (TENORMIN) 25 MG tablet Take 0.5 tablets (12.5 mg total) by mouth daily as needed (palpatations). 30 tablet 5  . CALCIUM CARBONATE PO Take 1 tablet by mouth daily.    . Cholecalciferol (VITAMIN D PO) Take 1 tablet by mouth daily.    Marland Kitchen CINNAMON PO Take 1 capsule by mouth. Takes occasionally    .  CYANOCOBALAMIN PO Take 1 tablet by mouth daily.    . diclofenac sodium (VOLTAREN) 1 % GEL APPLY 4 GRAMS TO THE AFFECTED AREA 3 TIMES DAILY AS NEEDED 100 g 3  . escitalopram (LEXAPRO) 10 MG tablet TAKE 0.5 TABLETS (5 MG TOTAL) BY MOUTH DAILY. 45 tablet 3  . KRILL OIL PO Take by mouth. Takes 1 tab occasionally    . magnesium gluconate (MAGONATE) 500 MG tablet Take 500 mg by mouth daily.    . Multiple Vitamin (MULTIVITAMIN) capsule Take 1 capsule by mouth daily.    Vladimir Faster Glycol-Propyl Glycol (SYSTANE OP) Apply 1 drop to eye daily as needed (Dry eyes).     Current Facility-Administered Medications on File Prior to Visit  Medication Dose Route Frequency Provider Last Rate Last Admin  . gi cocktail (Maalox,Lidocaine,Donnatal)  30 mL Oral Once Briscoe Deutscher, DO       Review of Systems  Constitutional: Negative for chills, fever and malaise/fatigue.  HENT: Positive for congestion, ear pain, sinus pain and sore throat. Negative for ear discharge and nosebleeds.   Eyes: Negative for blurred vision, discharge and redness.       Had eye d/c the first few days Better now   Respiratory: Positive for cough. Negative for shortness of breath and stridor.   Cardiovascular: Negative for chest pain, palpitations and leg swelling.  Gastrointestinal: Negative for abdominal pain, diarrhea, nausea and vomiting.  Musculoskeletal: Negative for myalgias.  Skin: Negative for rash.  Neurological: Negative for dizziness and headaches.    Observations/Objective: Patient sounds well/not distressed  Voice is slightly hoarse and congested sounding  Few dry coughs , not sob  Clears throat frequently  Nl cognition/good  historian Nl mood and affect   Assessment and Plan: Problem List Items Addressed This Visit      Respiratory   Acute sinusitis    tx with doxycycline (pcn allergic)  Symptomatic care advised Aware she had neg covid testing-re assuring  Fluids/rest  Saline/steam may help  Continue expectorant plus DM Update if not starting to improve in a week or if worsening        Relevant Medications   doxycycline (VIBRA-TABS) 100 MG tablet       Follow Up Instructions: I think you have a sinus infection   Take the doxycycline as directed Drink fluids/rest Steam and nasal saline may help congestion   Continue flonase Robitussin DM or Mucinex DM are fine   Update if not starting to improve in a week or if worsening     I discussed the assessment and treatment plan with the patient. The patient was provided an opportunity to ask questions and all were answered. The patient agreed with the plan and demonstrated an understanding of the instructions.   The patient was advised to call back or seek an in-person evaluation if the symptoms worsen or if the condition fails to improve as anticipated.  I provided 12 minutes of non-face-to-face time during this encounter.   [name], MD

## 2019-11-23 DIAGNOSIS — L820 Inflamed seborrheic keratosis: Secondary | ICD-10-CM | POA: Diagnosis not present

## 2019-11-23 DIAGNOSIS — Z85828 Personal history of other malignant neoplasm of skin: Secondary | ICD-10-CM | POA: Diagnosis not present

## 2019-11-23 DIAGNOSIS — B078 Other viral warts: Secondary | ICD-10-CM | POA: Diagnosis not present

## 2019-11-29 ENCOUNTER — Other Ambulatory Visit: Payer: Self-pay

## 2019-11-30 ENCOUNTER — Other Ambulatory Visit: Payer: Self-pay | Admitting: Gynecology

## 2019-11-30 ENCOUNTER — Ambulatory Visit (INDEPENDENT_AMBULATORY_CARE_PROVIDER_SITE_OTHER): Payer: PPO

## 2019-11-30 DIAGNOSIS — M8589 Other specified disorders of bone density and structure, multiple sites: Secondary | ICD-10-CM

## 2019-11-30 DIAGNOSIS — M8580 Other specified disorders of bone density and structure, unspecified site: Secondary | ICD-10-CM

## 2019-11-30 DIAGNOSIS — Z780 Asymptomatic menopausal state: Secondary | ICD-10-CM | POA: Diagnosis not present

## 2020-01-13 ENCOUNTER — Telehealth: Payer: Self-pay | Admitting: Family Medicine

## 2020-01-13 NOTE — Telephone Encounter (Signed)
Left message for patient to call back and schedule Medicare Annual Wellness Visit (AWV) either virtually or audio only. Will also need to schedule labs and CPE with PCP. Thanks  Last AWV 12/24/18

## 2020-01-17 ENCOUNTER — Telehealth: Payer: Self-pay | Admitting: Family Medicine

## 2020-01-17 DIAGNOSIS — E7800 Pure hypercholesterolemia, unspecified: Secondary | ICD-10-CM

## 2020-01-17 DIAGNOSIS — E559 Vitamin D deficiency, unspecified: Secondary | ICD-10-CM

## 2020-01-17 DIAGNOSIS — Z0000 Encounter for general adult medical examination without abnormal findings: Secondary | ICD-10-CM

## 2020-01-17 NOTE — Telephone Encounter (Signed)
-----   Message from Cloyd Stagers, RT sent at 01/17/2020  1:41 PM EDT ----- Regarding: Lab Orders for Tuesday 4.13.2021 Please place lab orders for Tuesday 4.13.2021, office visit for physical on Wednesday 4.21.2021 Thank you, Dyke Maes RT(R)

## 2020-01-18 ENCOUNTER — Other Ambulatory Visit (INDEPENDENT_AMBULATORY_CARE_PROVIDER_SITE_OTHER): Payer: PPO

## 2020-01-18 ENCOUNTER — Other Ambulatory Visit: Payer: Self-pay

## 2020-01-18 DIAGNOSIS — E7800 Pure hypercholesterolemia, unspecified: Secondary | ICD-10-CM | POA: Diagnosis not present

## 2020-01-18 DIAGNOSIS — E559 Vitamin D deficiency, unspecified: Secondary | ICD-10-CM | POA: Diagnosis not present

## 2020-01-18 DIAGNOSIS — Z0000 Encounter for general adult medical examination without abnormal findings: Secondary | ICD-10-CM | POA: Diagnosis not present

## 2020-01-18 LAB — LIPID PANEL
Cholesterol: 182 mg/dL (ref 0–200)
HDL: 43.5 mg/dL (ref >39.00)
LDL Cholesterol: 111 mg/dL — ABNORMAL HIGH (ref 0–99)
NonHDL: 138.7
Total CHOL/HDL Ratio: 4
Triglycerides: 139 mg/dL (ref 0.0–149.0)
VLDL: 27.8 mg/dL (ref 0.0–40.0)

## 2020-01-18 LAB — CBC WITH DIFFERENTIAL/PLATELET
Basophils Absolute: 0 10*3/uL (ref 0.0–0.1)
Basophils Relative: 0.4 % (ref 0.0–3.0)
Eosinophils Absolute: 0.1 10*3/uL (ref 0.0–0.7)
Eosinophils Relative: 1.5 % (ref 0.0–5.0)
HCT: 41.6 % (ref 36.0–46.0)
Hemoglobin: 14.4 g/dL (ref 12.0–15.0)
Lymphocytes Relative: 35.2 % (ref 12.0–46.0)
Lymphs Abs: 1.6 10*3/uL (ref 0.7–4.0)
MCHC: 34.7 g/dL (ref 30.0–36.0)
MCV: 96.2 fl (ref 78.0–100.0)
Monocytes Absolute: 0.3 10*3/uL (ref 0.1–1.0)
Monocytes Relative: 6 % (ref 3.0–12.0)
Neutro Abs: 2.6 10*3/uL (ref 1.4–7.7)
Neutrophils Relative %: 56.9 % (ref 43.0–77.0)
Platelets: 189 10*3/uL (ref 150.0–400.0)
RBC: 4.32 Mil/uL (ref 3.87–5.11)
RDW: 13 % (ref 11.5–15.5)
WBC: 4.6 10*3/uL (ref 4.0–10.5)

## 2020-01-18 LAB — COMPREHENSIVE METABOLIC PANEL
ALT: 25 U/L (ref 0–35)
AST: 24 U/L (ref 0–37)
Albumin: 4.1 g/dL (ref 3.5–5.2)
Alkaline Phosphatase: 77 U/L (ref 39–117)
BUN: 18 mg/dL (ref 6–23)
CO2: 27 mEq/L (ref 19–32)
Calcium: 9.5 mg/dL (ref 8.4–10.5)
Chloride: 103 mEq/L (ref 96–112)
Creatinine, Ser: 0.71 mg/dL (ref 0.40–1.20)
GFR: 81.91 mL/min (ref >60.00)
Glucose, Bld: 84 mg/dL (ref 70–99)
Potassium: 3.9 mEq/L (ref 3.5–5.1)
Sodium: 137 mEq/L (ref 135–145)
Total Bilirubin: 0.8 mg/dL (ref 0.2–1.2)
Total Protein: 6.6 g/dL (ref 6.0–8.3)

## 2020-01-18 LAB — TSH: TSH: 1.83 u[IU]/mL (ref 0.35–4.50)

## 2020-01-18 LAB — VITAMIN D 25 HYDROXY (VIT D DEFICIENCY, FRACTURES): VITD: 62.49 ng/mL (ref 30.00–100.00)

## 2020-01-19 ENCOUNTER — Ambulatory Visit (INDEPENDENT_AMBULATORY_CARE_PROVIDER_SITE_OTHER): Payer: PPO

## 2020-01-19 VITALS — Wt 175.0 lb

## 2020-01-19 DIAGNOSIS — Z0000 Encounter for general adult medical examination without abnormal findings: Secondary | ICD-10-CM

## 2020-01-19 NOTE — Progress Notes (Signed)
Subjective:   [name] [name] is a 69 y.o. female who presents for Medicare Annual (Subsequent) preventive examination.  Review of Systems: N/A   This visit is being conducted through telemedicine via telephone at the nurse health advisor's home address due to the COVID-19 pandemic. This patient has given me verbal consent via doximity to conduct this visit, patient states they are participating from their home address. Patient and myself are on the telephone call. There is no referral for this visit. Some vital signs may be absent or patient reported.    Patient identification: identified by name, DOB, and current address   Cardiac Risk Factors include: advanced age (>44men, >49 women);Other (see comment), Risk factor comments: hypercholesterolemia     Objective:     Vitals: Wt 175 lb (79.4 kg)   BMI 29.12 kg/m   Body mass index is 29.12 kg/m.  Advanced Directives 01/19/2020 09/06/2017 04/06/2017 04/05/2017 05/15/2015 05/10/2015 11/21/2014  Does Patient Have a Medical Advance Directive? No No No No No No Yes  Would patient like information on creating a medical advance directive? No - Patient declined No - Patient declined - - No - patient declined information No - patient declined information No - patient declined information    Tobacco Social History   Tobacco Use  Smoking Status Former Smoker  . Packs/day: 1.00  . Years: 15.00  . Pack years: 15.00  . Types: Cigarettes  . Quit date: 10/07/1978  . Years since quitting: 41.3  Smokeless Tobacco Never Used     Counseling given: Not Answered   Clinical Intake:  Pre-visit preparation completed: Yes  Pain : No/denies pain     Nutritional Risks: None Diabetes: No  How often do you need to have someone help you when you read instructions, pamphlets, or other written materials from your doctor or pharmacy?: 1 - Never What is the last grade level you completed in school?: 10th  Interpreter Needed?: No  Information entered by  :: CJohnson, LPN  Past Medical History:  Diagnosis Date  . Acute meniscal tear of knee   . Arthritis    "neck; left knee before replacement" (05/10/2015)  . Basal cell cancer 5/08  . Breast cyst    "left"  . Chest pain 9/11   [facility]) and palpitations - ruled out for MI with nl. Echo  . Complication of anesthesia HARD TO WAKE  . Difficulty sleeping   . Eczema   . GERD (gastroesophageal reflux disease)   . Heart palpitations OCCASIONAL--  TAKES ATENOLOL PRN  . Hiatal hernia   . HSV-2 infection    buttocks  . Hyperlipidemia DIET CONTROL  . Melanoma of skin ([place])   . MVP (mitral valve prolapse)    per pt no treatment needed  . Normal nuclear stress test 07-09-10  . Osteopenia 12/2013   T score -1.6 FRAX 16%/0.8%  . PONV (postoperative nausea and vomiting)    Past Surgical History:  Procedure Laterality Date  . Carotid US  9/05   Mild, recheck 1 year  . Carotid US  10/06   Normal  . CHONDROPLASTY  10/23/2011   Procedure: CHONDROPLASTY;  Surgeon: Gearlean Alf, MD;  [facility];  Service: Orthopedics;  Laterality: Left;  Left medial   . COLONOSCOPY    . ESOPHAGOGASTRODUODENOSCOPY  9/07   Normal  . HYSTEROSCOPY WITH RESECTOSCOPE  2000   POLPECTOMY'S  . JOINT REPLACEMENT    . KNEE ARTHROSCOPY  10/23/2011   Procedure: ARTHROSCOPY KNEE;  Surgeon: Gearlean Alf, MD;  Location: [facility];  Service: Orthopedics;  Laterality: Left;  LEFT KNEE SCOPE WITH DEBRIDEMENT   . MELANOMA EXCISION Right 1995   "side"  . MOHS SURGERY Left    "side of my nose"  . MOLE REMOVAL     "several; all over my body"  . MRI neck  3/00   C4-C5 herniation small stenosis mild C4-5, C5-6  . TONSILLECTOMY    . TOTAL KNEE ARTHROPLASTY Left 11/21/2014   Procedure: LEFT TOTAL KNEE ARTHROPLASTY;  Surgeon: Gearlean Alf, MD;  Location: [facility];  Service: Orthopedics;  Laterality: Left;  . TRANSTHORACIC ECHOCARDIOGRAM  06-29-2010   NORMAL LVSF, EF 55-60%,  MILD MR    . TUBAL LIGATION  1989  . VAGINAL HYSTERECTOMY  2001   complex hyperplasia with mother's history of uterine cancer   Family History  Problem Relation Age of Onset  . Cancer Mother        uterine  . Obesity Mother   . Thrombocytopenia Mother   . Diabetes Mother   . Osteoarthritis Mother   . Alcohol abuse Father   . Heart disease Father        CAD in 63's  . Emphysema Father   . Diabetes Maternal Grandmother        type 2  . Heart disease Paternal Aunt        CAD  . Heart disease Paternal Uncle        CAD  . Osteoarthritis Sister   . Osteoarthritis Maternal Aunt   . Colon cancer Neg Hx   . Breast cancer Neg Hx    Social History   Socioeconomic History  . Marital status: Married    Spouse name: Not on file  . Number of children: Not on file  . Years of education: Not on file  . Highest education level: Not on file  Occupational History  . Not on file  Tobacco Use  . Smoking status: Former Smoker    Packs/day: 1.00    Years: 15.00    Pack years: 15.00    Types: Cigarettes    Quit date: 10/07/1978    Years since quitting: 41.3  . Smokeless tobacco: Never Used  Substance and Sexual Activity  . Alcohol use: No    Alcohol/week: 0.0 standard drinks  . Drug use: No  . Sexual activity: Yes    Birth control/protection: Post-menopausal, Surgical    Comment: HYST-1st intercourse 69 yo-Fewer than 5 partners  Other Topics Concern  . Not on file  Social History Narrative   Daily caffeine use.   Patient gets regular exercise.   Social Determinants of Health   Financial Resource Strain: Low Risk   . Difficulty of Paying Living Expenses: Not hard at all  Food Insecurity: No Food Insecurity  . Worried About Charity fundraiser in the Last Year: Never true  . Ran Out of Food in the Last Year: Never true  Transportation Needs: No Transportation Needs  . Lack of Transportation (Medical): No  . Lack of Transportation (Non-Medical): No  Physical Activity: Sufficiently  Active  . Days of Exercise per Week: 5 days  . Minutes of Exercise per Session: 50 min  Stress: No Stress Concern Present  . Feeling of Stress : Only a little  Social Connections:   . Frequency of Communication with Friends and Family:   . Frequency of Social Gatherings with Friends and Family:   . Attends Religious Services:   .  Active Member of Clubs or Organizations:   . Attends Archivist Meetings:   Marland Kitchen Marital Status:     Outpatient Encounter Medications as of 01/19/2020  Medication Sig  . acyclovir (ZOVIRAX) 200 MG capsule Take 1 capsule (200 mg total) by mouth daily as needed.  Marland Kitchen acyclovir cream (ZOVIRAX) 5 % Apply 1 application topically daily as needed.  . Ascorbic Acid (VITAMIN C PO) Take 1 tablet by mouth daily.  Marland Kitchen atenolol (TENORMIN) 25 MG tablet Take 0.5 tablets (12.5 mg total) by mouth daily as needed (palpatations).  . CALCIUM CARBONATE PO Take 1 tablet by mouth daily.  . Cholecalciferol (VITAMIN D PO) Take 1 tablet by mouth daily.  Marland Kitchen CINNAMON PO Take 1 capsule by mouth. Takes occasionally  . CYANOCOBALAMIN PO Take 1 tablet by mouth daily.  . diclofenac sodium (VOLTAREN) 1 % GEL APPLY 4 GRAMS TO THE AFFECTED AREA 3 TIMES DAILY AS NEEDED  . escitalopram (LEXAPRO) 10 MG tablet TAKE 0.5 TABLETS (5 MG TOTAL) BY MOUTH DAILY.  Marland Kitchen KRILL OIL PO Take by mouth. Takes 1 tab occasionally  . magnesium gluconate (MAGONATE) 500 MG tablet Take 500 mg by mouth daily.  . Multiple Vitamin (MULTIVITAMIN) capsule Take 1 capsule by mouth daily.  Vladimir Faster Glycol-Propyl Glycol (SYSTANE OP) Apply 1 drop to eye daily as needed (Dry eyes).  Marland Kitchen aspirin EC 81 MG tablet Take 1 tablet (81 mg total) by mouth daily. (Patient not taking: Reported on 09/10/2019)  . doxycycline (VIBRA-TABS) 100 MG tablet Take 1 tablet (100 mg total) by mouth 2 (two) times daily. (Patient not taking: Reported on 01/19/2020)   Facility-Administered Encounter Medications as of 01/19/2020  Medication  . gi cocktail  (Maalox,Lidocaine,Donnatal)    Activities of Daily Living In your present state of health, do you have any difficulty performing the following activities: 01/19/2020  Hearing? N  Vision? N  Difficulty concentrating or making decisions? N  Walking or climbing stairs? N  Dressing or bathing? N  Doing errands, shopping? N  Preparing Food and eating ? N  Using the Toilet? N  In the past six months, have you accidently leaked urine? N  Do you have problems with loss of bowel control? N  Managing your Medications? N  Managing your Finances? N  Housekeeping or managing your Housekeeping? N  Some recent data might be hidden    Patient Care Team: Tower, Wynelle Fanny, MD as PCP - General    Assessment:   This is a routine wellness examination for [name] [name].  Exercise Activities and Dietary recommendations Current Exercise Habits: Home exercise routine, Type of exercise: Other - see comments(exercise bike), Time (Minutes): 45, Frequency (Times/Week): 5, Weekly Exercise (Minutes/Week): 225, Intensity: Moderate, Exercise limited by: None identified  Goals    . Patient Stated     01/19/2020, I will continue to ride my exercise bike 4-5 days a week for 45 minutes.        Fall Risk Fall Risk  01/19/2020 12/24/2018 12/19/2017  Falls in the past year? 0 0 Yes  Number falls in past yr: 0 - 1  Injury with Fall? 0 - Yes  Risk for fall due to : Medication side effect - -  Follow up Falls evaluation completed;Falls prevention discussed - -   Is the patient's home free of loose throw rugs in walkways, pet beds, electrical cords, etc?   yes      Grab bars in the bathroom? no      Handrails on the  stairs?   yes      Adequate lighting?   yes  Timed Get Up and Go performed: N/A  Depression Screen PHQ 2/9 Scores 01/19/2020 12/24/2018 12/19/2017 11/29/2013  PHQ - 2 Score 0 0 0 0  PHQ- 9 Score 0 - - -     Cognitive Function MMSE - Mini Mental State Exam 01/19/2020  Orientation to time 5  Orientation to  Place 5  Registration 3  Attention/ Calculation 5  Recall 3  Language- repeat 1       Mini Cog  Mini-Cog screen was completed. Maximum score is 22. A value of 0 denotes this part of the MMSE was not completed or the patient failed this part of the Mini-Cog screening.  Immunization History  Administered Date(s) Administered  . Influenza Split 10/12/2012  . Influenza,inj,Quad PF,6+ Mos 07/22/2014, 10/06/2015  . Pneumococcal Conjugate-13 12/19/2017  . Pneumococcal Polysaccharide-23 12/24/2018  . Td 01/27/2006  . Tdap 12/18/2016  . Zoster 11/29/2013    Qualifies for Shingles Vaccine: Yes  Screening Tests Health Maintenance  Topic Date Due  . URINE MICROALBUMIN  Never done  . INFLUENZA VACCINE  05/07/2020  . MAMMOGRAM  09/06/2021  . COLONOSCOPY  05/28/2025  . TETANUS/TDAP  12/19/2026  . DEXA SCAN  Completed  . Hepatitis C Screening  Completed  . PNA vac Low Risk Adult  Completed    Cancer Screenings: Lung: Low Dose CT Chest recommended if Age 56-80 years, 30 pack-year currently smoking OR have quit w/in 15 years. Patient does not qualify. Breast:  Up to date on Mammogram: Yes, completed 09/07/2019   Up to date of Bone Density/Dexa: Yes, completed 11/30/2019 Colorectal: completed 05/29/2015  Additional Screenings:  Hepatitis C Screening: 09/28/2015     Plan:   Patient will continue to ride her exercise bike 4-5 days a week for 45 minutes.   I have personally reviewed and noted the following in the patient's chart:   . Medical and social history . Use of alcohol, tobacco or illicit drugs  . Current medications and supplements . Functional ability and status . Nutritional status . Physical activity . Advanced directives . List of other physicians . Hospitalizations, surgeries, and ER visits in previous 12 months . Vitals . Screenings to include cognitive, depression, and falls . Referrals and appointments  In addition, I have reviewed and discussed with patient  certain preventive protocols, quality metrics, and best practice recommendations. A written personalized care plan for preventive services as well as general preventive health recommendations were provided to patient.     Andrez Grime, LPN  [phone]

## 2020-01-19 NOTE — Patient Instructions (Signed)
[name] [name] , Thank you for taking time to come for your Medicare Wellness Visit. I appreciate your ongoing commitment to your health goals. Please review the following plan we discussed and let me know if I can assist you in the future.   Screening recommendations/referrals: Colonoscopy: Up to date, completed 05/29/2015 Mammogram: Up to date, completed 09/07/2019 Bone Density: Up to date, completed 11/30/2019 Recommended yearly ophthalmology/optometry visit for glaucoma screening and checkup Recommended yearly dental visit for hygiene and checkup  Vaccinations: Influenza vaccine: Fall 2021 Pneumococcal vaccine: Completed series Tdap vaccine: Up to date, completed 12/18/2016 Shingles vaccine: discussed    Advanced directives: Advance directive discussed with you today. Even though you declined this today please call our office should you change your mind and we can give you the proper paperwork for you to fill out.  Conditions/risks identified: hypercholesterolemia  Next appointment: 01/26/2020 @ 2:30 pm    Preventive Care 65 Years and Older, Female Preventive care refers to lifestyle choices and visits with your health care provider that can promote health and wellness. What does preventive care include?  A yearly physical exam. This is also called an annual well check.  Dental exams once or twice a year.  Routine eye exams. Ask your health care provider how often you should have your eyes checked.  Personal lifestyle choices, including:  Daily care of your teeth and gums.  Regular physical activity.  Eating a healthy diet.  Avoiding tobacco and drug use.  Limiting alcohol use.  Practicing safe sex.  Taking low-dose aspirin every day.  Taking vitamin and mineral supplements as recommended by your health care provider. What happens during an annual well check? The services and screenings done by your health care provider during your annual well check will depend on your  age, overall health, lifestyle risk factors, and family history of disease. Counseling  Your health care provider may ask you questions about your:  Alcohol use.  Tobacco use.  Drug use.  Emotional well-being.  Home and relationship well-being.  Sexual activity.  Eating habits.  History of falls.  Memory and ability to understand (cognition).  Work and work Statistician.  Reproductive health. Screening  You may have the following tests or measurements:  Height, weight, and BMI.  Blood pressure.  Lipid and cholesterol levels. These may be checked every 5 years, or more frequently if you are over 29 years old.  Skin check.  Lung cancer screening. You may have this screening every year starting at age 35 if you have a 30-pack-year history of smoking and currently smoke or have quit within the past 15 years.  Fecal occult blood test (FOBT) of the stool. You may have this test every year starting at age 97.  Flexible sigmoidoscopy or colonoscopy. You may have a sigmoidoscopy every 5 years or a colonoscopy every 10 years starting at age 66.  Hepatitis C blood test.  Hepatitis B blood test.  Sexually transmitted disease (STD) testing.  Diabetes screening. This is done by checking your blood sugar (glucose) after you have not eaten for a while (fasting). You may have this done every 1-3 years.  Bone density scan. This is done to screen for osteoporosis. You may have this done starting at age 32.  Mammogram. This may be done every 1-2 years. Talk to your health care provider about how often you should have regular mammograms. Talk with your health care provider about your test results, treatment options, and if necessary, the need for more tests.  Vaccines  Your health care provider may recommend certain vaccines, such as:  Influenza vaccine. This is recommended every year.  Tetanus, diphtheria, and acellular pertussis (Tdap, Td) vaccine. You may need a Td booster  every 10 years.  Zoster vaccine. You may need this after age 45.  Pneumococcal 13-valent conjugate (PCV13) vaccine. One dose is recommended after age 84.  Pneumococcal polysaccharide (PPSV23) vaccine. One dose is recommended after age 36. Talk to your health care provider about which screenings and vaccines you need and how often you need them. This information is not intended to replace advice given to you by your health care provider. Make sure you discuss any questions you have with your health care provider. Document Released: 10/20/2015 Document Revised: 06/12/2016 Document Reviewed: 07/25/2015 Elsevier Interactive Patient Education  2017 [employer] Prevention in the Home Falls can cause injuries. They can happen to people of all ages. There are many things you can do to make your home safe and to help prevent falls. What can I do on the outside of my home?  Regularly fix the edges of walkways and driveways and fix any cracks.  Remove anything that might make you trip as you walk through a door, such as a raised step or threshold.  Trim any bushes or trees on the path to your home.  Use bright outdoor lighting.  Clear any walking paths of anything that might make someone trip, such as rocks or tools.  Regularly check to see if handrails are loose or broken. Make sure that both sides of any steps have handrails.  Any raised decks and porches should have guardrails on the edges.  Have any leaves, snow, or ice cleared regularly.  Use sand or salt on walking paths during winter.  Clean up any spills in your garage right away. This includes oil or grease spills. What can I do in the bathroom?  Use night lights.  Install grab bars by the toilet and in the tub and shower. Do not use towel bars as grab bars.  Use non-skid mats or decals in the tub or shower.  If you need to sit down in the shower, use a plastic, non-slip stool.  Keep the floor dry. Clean up any  water that spills on the floor as soon as it happens.  Remove soap buildup in the tub or shower regularly.  Attach bath mats securely with double-sided non-slip rug tape.  Do not have throw rugs and other things on the floor that can make you trip. What can I do in the bedroom?  Use night lights.  Make sure that you have a light by your bed that is easy to reach.  Do not use any sheets or blankets that are too big for your bed. They should not hang down onto the floor.  Have a firm chair that has side arms. You can use this for support while you get dressed.  Do not have throw rugs and other things on the floor that can make you trip. What can I do in the kitchen?  Clean up any spills right away.  Avoid walking on wet floors.  Keep items that you use a lot in easy-to-reach places.  If you need to reach something above you, use a strong step stool that has a grab bar.  Keep electrical cords out of the way.  Do not use floor polish or wax that makes floors slippery. If you must use wax, use non-skid floor wax.  Do not have throw rugs and other things on the floor that can make you trip. What can I do with my stairs?  Do not leave any items on the stairs.  Make sure that there are handrails on both sides of the stairs and use them. Fix handrails that are broken or loose. Make sure that handrails are as long as the stairways.  Check any carpeting to make sure that it is firmly attached to the stairs. Fix any carpet that is loose or worn.  Avoid having throw rugs at the top or bottom of the stairs. If you do have throw rugs, attach them to the floor with carpet tape.  Make sure that you have a light switch at the top of the stairs and the bottom of the stairs. If you do not have them, ask someone to add them for you. What else can I do to help prevent falls?  Wear shoes that:  Do not have high heels.  Have rubber bottoms.  Are comfortable and fit you well.  Are closed  at the toe. Do not wear sandals.  If you use a stepladder:  Make sure that it is fully opened. Do not climb a closed stepladder.  Make sure that both sides of the stepladder are locked into place.  Ask someone to hold it for you, if possible.  Clearly mark and make sure that you can see:  Any grab bars or handrails.  First and last steps.  Where the edge of each step is.  Use tools that help you move around (mobility aids) if they are needed. These include:  Canes.  Walkers.  Scooters.  Crutches.  Turn on the lights when you go into a dark area. Replace any light bulbs as soon as they burn out.  Set up your furniture so you have a clear path. Avoid moving your furniture around.  If any of your floors are uneven, fix them.  If there are any pets around you, be aware of where they are.  Review your medicines with your doctor. Some medicines can make you feel dizzy. This can increase your chance of falling. Ask your doctor what other things that you can do to help prevent falls. This information is not intended to replace advice given to you by your health care provider. Make sure you discuss any questions you have with your health care provider. Document Released: 07/20/2009 Document Revised: 02/29/2016 Document Reviewed: 10/28/2014 Elsevier Interactive Patient Education  2017 [employer].

## 2020-01-19 NOTE — Progress Notes (Addendum)
PCP notes:  Health Maintenance: No gaps noted    Abnormal Screenings: none   Patient concerns: Left side discomfort (burning pain) Left ear stopped up   Nurse concerns: none   Next PCP appt.: 01/26/2020 @ 2:30 pm   I reviewed health advisor's note, was available for consultation, and agree with documentation and plan. [name] MD

## 2020-01-26 ENCOUNTER — Encounter: Payer: Self-pay | Admitting: Family Medicine

## 2020-01-26 ENCOUNTER — Ambulatory Visit (INDEPENDENT_AMBULATORY_CARE_PROVIDER_SITE_OTHER): Payer: PPO | Admitting: Family Medicine

## 2020-01-26 ENCOUNTER — Other Ambulatory Visit: Payer: Self-pay

## 2020-01-26 VITALS — BP 130/72 | HR 75 | Temp 97.6°F | Ht 64.25 in | Wt 183.0 lb

## 2020-01-26 DIAGNOSIS — Z0000 Encounter for general adult medical examination without abnormal findings: Secondary | ICD-10-CM | POA: Diagnosis not present

## 2020-01-26 DIAGNOSIS — E7800 Pure hypercholesterolemia, unspecified: Secondary | ICD-10-CM | POA: Diagnosis not present

## 2020-01-26 DIAGNOSIS — E559 Vitamin D deficiency, unspecified: Secondary | ICD-10-CM

## 2020-01-26 DIAGNOSIS — F419 Anxiety disorder, unspecified: Secondary | ICD-10-CM

## 2020-01-26 DIAGNOSIS — M8589 Other specified disorders of bone density and structure, multiple sites: Secondary | ICD-10-CM | POA: Diagnosis not present

## 2020-01-26 MED ORDER — ESCITALOPRAM OXALATE 10 MG PO TABS: 5.0000 mg | ORAL_TABLET | Freq: Every day | ORAL | 3 refills | Status: DC

## 2020-01-26 MED ORDER — ATENOLOL 25 MG PO TABS: 12.5000 mg | ORAL_TABLET | Freq: Every day | ORAL | 5 refills | Status: DC | PRN

## 2020-01-26 NOTE — Assessment & Plan Note (Signed)
Disc goals for lipids and reasons to control them Rev last labs with pt Rev low sat fat diet in detail  HDL is down  Enc fish intake/omega 3 and exercise

## 2020-01-26 NOTE — Assessment & Plan Note (Signed)
Rev dexa from gyn 2/21 Mild bone loss No falls or fx D level is therapeutic  Good exercise

## 2020-01-26 NOTE — Patient Instructions (Addendum)
Keep exercising - make sure to challenge yourself with intensity  Any resistance training would help bone health and fitness Beginning yoga or fitness bands are ok    Keep taking good care of yourself   Labs are stable   For weight control  Try to get most of your carbohydrates from produce (with the exception of white potatoes)  Eat less bread/pasta/rice/snack foods/cereals/sweets and other items from the middle of the grocery store (processed carbs)   Think about cutting back on sugar in tea

## 2020-01-26 NOTE — Progress Notes (Signed)
Subjective:    Patient ID: [name] [name], female    DOB: 15-Jun-1951, 69 y.o.   MRN: [id number]  This visit occurred during the SARS-CoV-2 public health emergency.  Safety protocols were in place, including screening questions prior to the visit, additional usage of staff PPE, and extensive cleaning of exam room while observing appropriate contact time as indicated for disinfecting solutions.    HPI Here for health maintenance exam and to review chronic medical problems    Wt Readings from Last 3 Encounters:  01/26/20 183 lb (83 kg)  01/19/20 175 lb (79.4 kg)  09/10/19 181 lb (82.1 kg)  has gained some weight  31.17 kg/m   Had her amw on 4/14 No gaps   covid status has not had covid  Has not been vaccinated -unsure if she will get the vaccines  Zoster status-had zostavax    Mammogram 12/20 Self breast exam-no lumps  Just had a breast exam from gyn - Dr Phineas Real with breast exam (early in the year)  She had partial hysterectomy   Colonoscopy 8/16   dexa 2/21- at gyn  Osteopenia Falls - none  Fractures -none  Supplements - takes 5000 iu daily  Vit D level is 62 Exercise - uses her exercise bike / 35-40 minutes  Does some sit ups   Mood -h/o anxiety  Takes lexapro 1/2 pill daily    BP Readings from Last 3 Encounters:  01/26/20 130/72  09/10/19 122/78  07/26/19 114/72   Pulse Readings from Last 3 Encounters:  01/26/20 75  07/26/19 78  12/24/18 69   Hyperlipidemia Lab Results  Component Value Date   CHOL 182 01/18/2020   CHOL 186 12/17/2018   CHOL 172 12/12/2017   Lab Results  Component Value Date   HDL 43.50 01/18/2020   HDL 55.30 12/17/2018   HDL 53.70 12/12/2017   Lab Results  Component Value Date   LDLCALC 111 (H) 01/18/2020   LDLCALC 110 (H) 12/17/2018   LDLCALC 96 12/12/2017   Lab Results  Component Value Date   TRIG 139.0 01/18/2020   TRIG 107.0 12/17/2018   TRIG 109.0 12/12/2017   Lab Results  Component Value Date   CHOLHDL 4  01/18/2020   CHOLHDL 3 12/17/2018   CHOLHDL 3 12/12/2017   Lab Results  Component Value Date   LDLDIRECT 124.4 09/12/2010    HDL was down a little  Krill oil   Other labs Results for orders placed or performed in visit on 01/18/20  VITAMIN D 25 Hydroxy (Vit-D Deficiency, Fractures)  Result Value Ref Range   VITD 62.49 30.00 - 100.00 ng/mL  TSH  Result Value Ref Range   TSH 1.83 0.35 - 4.50 uIU/mL  Lipid panel  Result Value Ref Range   Cholesterol 182 0 - 200 mg/dL   Triglycerides 139.0 0.0 - 149.0 mg/dL   HDL 43.50 >39.00 mg/dL   VLDL 27.8 0.0 - 40.0 mg/dL   LDL Cholesterol 111 (H) 0 - 99 mg/dL   Total CHOL/HDL Ratio 4    NonHDL 138.70   Comprehensive metabolic panel  Result Value Ref Range   Sodium 137 135 - 145 mEq/L   Potassium 3.9 3.5 - 5.1 mEq/L   Chloride 103 96 - 112 mEq/L   CO2 27 19 - 32 mEq/L   Glucose, Bld 84 70 - 99 mg/dL   BUN 18 6 - 23 mg/dL   Creatinine, Ser 0.71 0.40 - 1.20 mg/dL   Total Bilirubin 0.8 0.2 - 1.2  mg/dL   Alkaline Phosphatase 77 39 - 117 U/L   AST 24 0 - 37 U/L   ALT 25 0 - 35 U/L   Total Protein 6.6 6.0 - 8.3 g/dL   Albumin 4.1 3.5 - 5.2 g/dL   GFR 81.91 >60.00 mL/min   Calcium 9.5 8.4 - 10.5 mg/dL  CBC with Differential/Platelet  Result Value Ref Range   WBC 4.6 4.0 - 10.5 K/uL   RBC 4.32 3.87 - 5.11 Mil/uL   Hemoglobin 14.4 12.0 - 15.0 g/dL   HCT 41.6 36.0 - 46.0 %   MCV 96.2 78.0 - 100.0 fl   MCHC 34.7 30.0 - 36.0 g/dL   RDW 13.0 11.5 - 15.5 %   Platelets 189.0 150.0 - 400.0 K/uL   Neutrophils Relative % 56.9 43.0 - 77.0 %   Lymphocytes Relative 35.2 12.0 - 46.0 %   Monocytes Relative 6.0 3.0 - 12.0 %   Eosinophils Relative 1.5 0.0 - 5.0 %   Basophils Relative 0.4 0.0 - 3.0 %   Neutro Abs 2.6 1.4 - 7.7 K/uL   Lymphs Abs 1.6 0.7 - 4.0 K/uL   Monocytes Absolute 0.3 0.1 - 1.0 K/uL   Eosinophils Absolute 0.1 0.0 - 0.7 K/uL   Basophils Absolute 0.0 0.0 - 0.1 K/uL    Dermatology- has had areas cryo tx   Patient Active  Problem List   Diagnosis Date Noted  . Medicare annual wellness visit, initial 12/19/2017  . Cough 12/19/2017  . Anxiety 09/24/2017  . Sleep disorder 09/12/2017  . Urinary frequency 05/02/2017  . Palpitations 05/22/2015  . OA (osteoarthritis) of knee 11/21/2014  . Colon cancer screening 11/29/2013  . Routine general medical examination at a health care facility 09/16/2011  . DERMATITIS, ATOPIC 11/27/2010  . URTICARIA DUE TO COLD OR HEAT 11/27/2010  . STRESS REACTION, ACUTE, WITH EMOTIONAL DISTURBANCE 09/14/2010  . THROAT PAIN, CHRONIC 09/14/2010  . EXTERNAL HEMORRHOIDS WITHOUT MENTION COMP 06/25/2010  . ABNORMAL EKG 12/26/2009  . OTHER CHRONIC SINUSITIS 10/19/2009  . Vitamin D deficiency 04/21/2009  . ALLERGIC RHINITIS 11/09/2008  . GERD 07/28/2008  . Osteopenia 03/14/2008  . HYPERCHOLESTEROLEMIA, PURE 03/13/2007  . HIATAL HERNIA 01/12/2007   Past Medical History:  Diagnosis Date  . Acute meniscal tear of knee   . Arthritis    "neck; left knee before replacement" (05/10/2015)  . Basal cell cancer 5/08  . Breast cyst    "left"  . Chest pain 9/11   [facility]) and palpitations - ruled out for MI with nl. Echo  . Complication of anesthesia HARD TO WAKE  . Difficulty sleeping   . Eczema   . GERD (gastroesophageal reflux disease)   . Heart palpitations OCCASIONAL--  TAKES ATENOLOL PRN  . Hiatal hernia   . HSV-2 infection    buttocks  . Hyperlipidemia DIET CONTROL  . Melanoma of skin ([place])   . MVP (mitral valve prolapse)    per pt no treatment needed  . Normal nuclear stress test 07-09-10  . Osteopenia 12/2013   T score -1.6 FRAX 16%/0.8%  . PONV (postoperative nausea and vomiting)    Past Surgical History:  Procedure Laterality Date  . Carotid US  9/05   Mild, recheck 1 year  . Carotid US  10/06   Normal  . CHONDROPLASTY  10/23/2011   Procedure: CHONDROPLASTY;  Surgeon: Gearlean Alf, MD;  Location: [facility];  Service: Orthopedics;  Laterality: Left;   Left medial   . COLONOSCOPY    .  ESOPHAGOGASTRODUODENOSCOPY  9/07   Normal  . HYSTEROSCOPY WITH RESECTOSCOPE  2000   POLPECTOMY'S  . JOINT REPLACEMENT    . KNEE ARTHROSCOPY  10/23/2011   Procedure: ARTHROSCOPY KNEE;  Surgeon: Gearlean Alf, MD;  Location: [facility];  Service: Orthopedics;  Laterality: Left;  LEFT KNEE SCOPE WITH DEBRIDEMENT   . MELANOMA EXCISION Right 1995   "side"  . MOHS SURGERY Left    "side of my nose"  . MOLE REMOVAL     "several; all over my body"  . MRI neck  3/00   C4-C5 herniation small stenosis mild C4-5, C5-6  . TONSILLECTOMY    . TOTAL KNEE ARTHROPLASTY Left 11/21/2014   Procedure: LEFT TOTAL KNEE ARTHROPLASTY;  Surgeon: Gearlean Alf, MD;  Location: [facility];  Service: Orthopedics;  Laterality: Left;  . TRANSTHORACIC ECHOCARDIOGRAM  06-29-2010   NORMAL LVSF, EF 55-60%,  MILD MR  . TUBAL LIGATION  1989  . VAGINAL HYSTERECTOMY  2001   complex hyperplasia with mother's history of uterine cancer   Social History   Tobacco Use  . Smoking status: Former Smoker    Packs/day: 1.00    Years: 15.00    Pack years: 15.00    Types: Cigarettes    Quit date: 10/07/1978    Years since quitting: 41.3  . Smokeless tobacco: Never Used  Substance Use Topics  . Alcohol use: No    Alcohol/week: 0.0 standard drinks  . Drug use: No   Family History  Problem Relation Age of Onset  . Cancer Mother        uterine  . Obesity Mother   . Thrombocytopenia Mother   . Diabetes Mother   . Osteoarthritis Mother   . Alcohol abuse Father   . Heart disease Father        CAD in 37's  . Emphysema Father   . Diabetes Maternal Grandmother        type 2  . Heart disease Paternal Aunt        CAD  . Heart disease Paternal Uncle        CAD  . Osteoarthritis Sister   . Osteoarthritis Maternal Aunt   . Colon cancer Neg Hx   . Breast cancer Neg Hx    Allergies  Allergen Reactions  . Amoxicillin Rash  . Codeine Rash  . Macrobid [Nitrofurantoin]     GI  issues  . Clarithromycin Other (See Comments)    REACTION: reaction not known  . Trazodone And Nefazodone Palpitations   Current Outpatient Medications on File Prior to Visit  Medication Sig Dispense Refill  . acyclovir (ZOVIRAX) 200 MG capsule Take 1 capsule (200 mg total) by mouth daily as needed. 60 capsule 1  . acyclovir cream (ZOVIRAX) 5 % Apply 1 application topically daily as needed. 5 g 5  . Ascorbic Acid (VITAMIN C PO) Take 1 tablet by mouth daily.    Marland Kitchen aspirin EC 81 MG tablet Take 1 tablet (81 mg total) by mouth daily.    Marland Kitchen CALCIUM CARBONATE PO Take 1 tablet by mouth daily.    . Cholecalciferol (VITAMIN D PO) Take 1 tablet by mouth daily.    Marland Kitchen CINNAMON PO Take 1 capsule by mouth. Takes occasionally    . CYANOCOBALAMIN PO Take 1 tablet by mouth daily.    . diclofenac sodium (VOLTAREN) 1 % GEL APPLY 4 GRAMS TO THE AFFECTED AREA 3 TIMES DAILY AS NEEDED 100 g 3  . KRILL OIL PO Take  by mouth. Takes 1 tab occasionally    . magnesium gluconate (MAGONATE) 500 MG tablet Take 500 mg by mouth daily.    . Multiple Vitamin (MULTIVITAMIN) capsule Take 1 capsule by mouth daily.    Vladimir Faster Glycol-Propyl Glycol (SYSTANE OP) Apply 1 drop to eye daily as needed (Dry eyes).     Current Facility-Administered Medications on File Prior to Visit  Medication Dose Route Frequency Provider Last Rate Last Admin  . gi cocktail (Maalox,Lidocaine,Donnatal)  30 mL Oral Once Briscoe Deutscher, DO         Review of Systems  Constitutional: Negative for activity change, appetite change, fatigue, fever and unexpected weight change.       Pt frustrated with weight gain  HENT: Negative for congestion, ear pain, rhinorrhea, sinus pressure and sore throat.   Eyes: Negative for pain, redness and visual disturbance.  Respiratory: Negative for cough, shortness of breath and wheezing.   Cardiovascular: Negative for chest pain and palpitations.  Gastrointestinal: Negative for abdominal pain, blood in stool,  constipation and diarrhea.  Endocrine: Negative for polydipsia and polyuria.  Genitourinary: Negative for dysuria, frequency and urgency.  Musculoskeletal: Negative for arthralgias, back pain and myalgias.  Skin: Negative for pallor and rash.  Allergic/Immunologic: Negative for environmental allergies.  Neurological: Negative for dizziness, syncope and headaches.  Hematological: Negative for adenopathy. Does not bruise/bleed easily.  Psychiatric/Behavioral: Negative for decreased concentration and dysphoric mood. The patient is not nervous/anxious.        Objective:   Physical Exam Constitutional:      General: She is not in acute distress.    Appearance: Normal appearance. She is well-developed. She is obese. She is not ill-appearing or diaphoretic.  HENT:     Head: Normocephalic and atraumatic.     Right Ear: Tympanic membrane, ear canal and external ear normal.     Left Ear: Tympanic membrane, ear canal and external ear normal.     Nose: Nose normal. No congestion.     Mouth/Throat:     Mouth: Mucous membranes are moist.     Pharynx: Oropharynx is clear. No posterior oropharyngeal erythema.  Eyes:     General: No scleral icterus.    Extraocular Movements: Extraocular movements intact.     Conjunctiva/sclera: Conjunctivae normal.     Pupils: Pupils are equal, round, and reactive to light.  Neck:     Thyroid: No thyromegaly.     Vascular: No carotid bruit or JVD.  Cardiovascular:     Rate and Rhythm: Normal rate and regular rhythm.     Pulses: Normal pulses.     Heart sounds: Normal heart sounds. No gallop.   Pulmonary:     Effort: Pulmonary effort is normal. No respiratory distress.     Breath sounds: Normal breath sounds. No wheezing.     Comments: Good air exch Chest:     Chest wall: No tenderness.  Abdominal:     General: Bowel sounds are normal. There is no distension or abdominal bruit.     Palpations: Abdomen is soft. There is no mass.     Tenderness: There is no  abdominal tenderness.     Hernia: No hernia is present.  Genitourinary:    Comments: Breast and pelvic exam done by gyn  Musculoskeletal:        General: No tenderness. Normal range of motion.     Cervical back: Normal range of motion and neck supple. No rigidity. No muscular tenderness.     Right lower  leg: No edema.     Left lower leg: No edema.  Lymphadenopathy:     Cervical: No cervical adenopathy.  Skin:    General: Skin is warm and dry.     Coloration: Skin is not pale.     Findings: No erythema or rash.     Comments: Solar lentigines diffusely sks  Some recently cryo tx areas  Solar damage  Neurological:     Mental Status: She is alert. Mental status is at baseline.     Cranial Nerves: No cranial nerve deficit.     Motor: No abnormal muscle tone.     Coordination: Coordination normal.     Gait: Gait normal.     Deep Tendon Reflexes: Reflexes are normal and symmetric. Reflexes normal.  Psychiatric:        Mood and Affect: Mood normal.        Cognition and Memory: Cognition and memory normal.           Assessment & Plan:   Problem List Items Addressed This Visit      Musculoskeletal and Integument   Osteopenia    Rev dexa from gyn 2/21 Mild bone loss No falls or fx D level is therapeutic  Good exercise         Other   Vitamin D deficiency    Vitamin D level is therapeutic with current supplementation Disc importance of this to bone and overall health Good level in the 60s      HYPERCHOLESTEROLEMIA, PURE    Disc goals for lipids and reasons to control them Rev last labs with pt Rev low sat fat diet in detail  HDL is down  Enc fish intake/omega 3 and exercise        Relevant Medications   atenolol (TENORMIN) 25 MG tablet   Routine general medical examination at a health care facility - Primary    Reviewed health habits including diet and exercise and skin cancer prevention Reviewed appropriate screening tests for age  Also reviewed health mt  list, fam hx and immunization status , as well as social and family history   See HPI Labs reviewed  Enc to continue covid vaccine  Also shingrix utd gyn care with Dr Phineas Real        Anxiety    Continues to control with lexapro  Reviewed stressors/ coping techniques/symptoms/ support sources/ tx options and side effects in detail today Also good self care      Relevant Medications   escitalopram (LEXAPRO) 10 MG tablet

## 2020-01-26 NOTE — Assessment & Plan Note (Signed)
Reviewed health habits including diet and exercise and skin cancer prevention Reviewed appropriate screening tests for age  Also reviewed health mt list, fam hx and immunization status , as well as social and family history   See HPI Labs reviewed  Enc to continue covid vaccine  Also shingrix utd gyn care with Dr Phineas Real

## 2020-01-26 NOTE — Assessment & Plan Note (Signed)
Vitamin D level is therapeutic with current supplementation Disc importance of this to bone and overall health Good level in the 60s

## 2020-01-26 NOTE — Assessment & Plan Note (Signed)
Continues to control with lexapro  Reviewed stressors/ coping techniques/symptoms/ support sources/ tx options and side effects in detail today Also good self care

## 2020-02-24 DIAGNOSIS — Z96652 Presence of left artificial knee joint: Secondary | ICD-10-CM | POA: Diagnosis not present

## 2020-05-17 ENCOUNTER — Encounter: Payer: Self-pay | Admitting: Family Medicine

## 2020-05-17 DIAGNOSIS — D2271 Melanocytic nevi of right lower limb, including hip: Secondary | ICD-10-CM | POA: Diagnosis not present

## 2020-05-17 DIAGNOSIS — D485 Neoplasm of uncertain behavior of skin: Secondary | ICD-10-CM | POA: Diagnosis not present

## 2020-05-17 DIAGNOSIS — D2261 Melanocytic nevi of right upper limb, including shoulder: Secondary | ICD-10-CM | POA: Diagnosis not present

## 2020-05-17 DIAGNOSIS — D0462 Carcinoma in situ of skin of left upper limb, including shoulder: Secondary | ICD-10-CM | POA: Diagnosis not present

## 2020-05-17 DIAGNOSIS — L570 Actinic keratosis: Secondary | ICD-10-CM | POA: Diagnosis not present

## 2020-05-17 DIAGNOSIS — D2272 Melanocytic nevi of left lower limb, including hip: Secondary | ICD-10-CM | POA: Diagnosis not present

## 2020-05-17 DIAGNOSIS — Z85828 Personal history of other malignant neoplasm of skin: Secondary | ICD-10-CM | POA: Diagnosis not present

## 2020-05-17 DIAGNOSIS — L720 Epidermal cyst: Secondary | ICD-10-CM | POA: Diagnosis not present

## 2020-05-17 DIAGNOSIS — D1801 Hemangioma of skin and subcutaneous tissue: Secondary | ICD-10-CM | POA: Diagnosis not present

## 2020-05-17 DIAGNOSIS — L814 Other melanin hyperpigmentation: Secondary | ICD-10-CM | POA: Diagnosis not present

## 2020-05-17 DIAGNOSIS — L821 Other seborrheic keratosis: Secondary | ICD-10-CM | POA: Diagnosis not present

## 2020-05-17 DIAGNOSIS — L820 Inflamed seborrheic keratosis: Secondary | ICD-10-CM | POA: Diagnosis not present

## 2020-06-06 DIAGNOSIS — Z85828 Personal history of other malignant neoplasm of skin: Secondary | ICD-10-CM | POA: Diagnosis not present

## 2020-06-06 DIAGNOSIS — B351 Tinea unguium: Secondary | ICD-10-CM | POA: Diagnosis not present

## 2020-06-06 DIAGNOSIS — D0462 Carcinoma in situ of skin of left upper limb, including shoulder: Secondary | ICD-10-CM | POA: Diagnosis not present

## 2020-10-12 ENCOUNTER — Telehealth: Payer: Self-pay

## 2020-10-12 ENCOUNTER — Ambulatory Visit
Admission: EM | Admit: 2020-10-12 | Discharge: 2020-10-12 | Disposition: A | Discharge status: Home / Self Care | Payer: PPO | Attending: Family Medicine | Admitting: Family Medicine

## 2020-10-12 DIAGNOSIS — Z1152 Encounter for screening for COVID-19: Secondary | ICD-10-CM

## 2020-10-12 DIAGNOSIS — R0981 Nasal congestion: Secondary | ICD-10-CM

## 2020-10-12 NOTE — Telephone Encounter (Signed)
Contacted pt who reports she has cough, sore throat, fever yesterday of 99 and HA and head congestion. Audible chest congestion when coughing and head congestion when talking. Denies SOB, N/V/D, or changes in taste or smell. Pt reports she called office yesterday and was told there was not capability for a covid test until Monday so she called [facility] and they said they couldn't test until Sunday so she said just forget it. She said she could not do a VV, she can only do a telephone visit. Advised pt she should be assessed due to congestion and she should go to UC to be assessed and tested if necessary. Pt said she didn't want to wait for hours at an urgent care. Looked at [facility] said it had a 1 minute wait. Pt said she would go there now. Advised if anything is needed from this office to contact us. Pt verbalized understanding.

## 2020-10-12 NOTE — Discharge Instructions (Addendum)
This is most likely something viral.  You can continue the Mucinex as needed.  Covid test pending Follow up as needed for continued or worsening symptoms

## 2020-10-12 NOTE — ED Triage Notes (Signed)
Pt presents with scratchy  Throat, cough, low grade fever and left ear pain

## 2020-10-12 NOTE — ED Provider Notes (Signed)
[name] [name]    CSN: [id number] Arrival date & time: 10/12/20  1134      History   Chief Complaint Chief Complaint  Patient presents with  . Sore Throat  . Otalgia    HPI [name] [name] is a 70 y.o. female.   Patient is a 70 year old female who presents today for sore throat, cough, nasal congestion, low-grade fever and ear pain.  This is been present since Sunday.  Taking Mucinex D with some relief.  No known sick contacts.     Past Medical History:  Diagnosis Date  . Acute meniscal tear of knee   . Arthritis    "neck; left knee before replacement" (05/10/2015)  . Basal cell cancer 5/08  . Breast cyst    "left"  . Chest pain 9/11   [facility]) and palpitations - ruled out for MI with nl. Echo  . Complication of anesthesia HARD TO WAKE  . Difficulty sleeping   . Eczema   . GERD (gastroesophageal reflux disease)   . Heart palpitations OCCASIONAL--  TAKES ATENOLOL PRN  . Hiatal hernia   . HSV-2 infection    buttocks  . Hyperlipidemia DIET CONTROL  . Melanoma of skin (HCC)   . MVP (mitral valve prolapse)    per pt no treatment needed  . Normal nuclear stress test 07-09-10  . Osteopenia 12/2013   T score -1.6 FRAX 16%/0.8%  . PONV (postoperative nausea and vomiting)     Patient Active Problem List   Diagnosis Date Noted  . Medicare annual wellness visit, initial 12/19/2017  . Cough 12/19/2017  . Anxiety 09/24/2017  . Sleep disorder 09/12/2017  . Urinary frequency 05/02/2017  . Palpitations 05/22/2015  . OA (osteoarthritis) of knee 11/21/2014  . Colon cancer screening 11/29/2013  . Routine general medical examination at a health care facility 09/16/2011  . DERMATITIS, ATOPIC 11/27/2010  . URTICARIA DUE TO COLD OR HEAT 11/27/2010  . STRESS REACTION, ACUTE, WITH EMOTIONAL DISTURBANCE 09/14/2010  . THROAT PAIN, CHRONIC 09/14/2010  . EXTERNAL HEMORRHOIDS WITHOUT MENTION COMP 06/25/2010  . ABNORMAL EKG 12/26/2009  . OTHER CHRONIC SINUSITIS 10/19/2009  .  Vitamin D deficiency 04/21/2009  . ALLERGIC RHINITIS 11/09/2008  . GERD 07/28/2008  . Osteopenia 03/14/2008  . HYPERCHOLESTEROLEMIA, PURE 03/13/2007  . HIATAL HERNIA 01/12/2007    Past Surgical History:  Procedure Laterality Date  . Carotid US  9/05   Mild, recheck 1 year  . Carotid US  10/06   Normal  . CHONDROPLASTY  10/23/2011   Procedure: CHONDROPLASTY;  Surgeon: Loanne Drilling, MD;  Location[facility];  Service: Orthopedics;  Laterality: Left;  Left medial   . COLONOSCOPY    . ESOPHAGOGASTRODUODENOSCOPY  9/07   Normal  . HYSTEROSCOPY WITH RESECTOSCOPE  2000   POLPECTOMY'S  . JOINT REPLACEMENT    . KNEE ARTHROSCOPY  10/23/2011   Procedure: ARTHROSCOPY KNEE;  Surgeon: Loanne Drilling, MD;  Location: [facility];  Service: Orthopedics;  Laterality: Left;  LEFT KNEE SCOPE WITH DEBRIDEMENT   . MELANOMA EXCISION Right 1995   "side"  . MOHS SURGERY Left    "side of my nose"  . MOLE REMOVAL     "several; all over my body"  . MRI neck  3/00   C4-C5 herniation small stenosis mild C4-5, C5-6  . TONSILLECTOMY    . TOTAL KNEE ARTHROPLASTY Left 11/21/2014   Procedure: LEFT TOTAL KNEE ARTHROPLASTY;  Surgeon: Loanne Drilling, MD;  Location: [facility];  Service: Orthopedics;  Laterality: Left;  . TRANSTHORACIC ECHOCARDIOGRAM  06-29-2010   NORMAL LVSF, EF 55-60%,  MILD MR  . TUBAL LIGATION  1989  . VAGINAL HYSTERECTOMY  2001   complex hyperplasia with mother's history of uterine cancer    OB History    Gravida  3   Para  3   Term      Preterm      AB      Living  3     SAB      IAB      Ectopic      Multiple      Live Births               Home Medications    Prior to Admission medications   Medication Sig Start Date End Date Taking? Authorizing Provider  acyclovir (ZOVIRAX) 200 MG capsule Take 1 capsule (200 mg total) by mouth daily as needed. 06/02/19   Tower, Audrie GallusMarne A, MD  acyclovir cream (ZOVIRAX) 5 % Apply 1 application  topically daily as needed. 12/24/18   Tower, Audrie GallusMarne A, MD  Ascorbic Acid (VITAMIN C PO) Take 1 tablet by mouth daily.    [provider]  aspirin EC 81 MG tablet Take 1 tablet (81 mg total) by mouth daily. 05/11/15   Jerald Kiefhiu, Stephen K, MD  atenolol (TENORMIN) 25 MG tablet Take 0.5 tablets (12.5 mg total) by mouth daily as needed (palpatations). 01/26/20   Tower, Audrie GallusMarne A, MD  CALCIUM CARBONATE PO Take 1 tablet by mouth daily.    [provider]  Cholecalciferol (VITAMIN D PO) Take 1 tablet by mouth daily.    [provider]  CINNAMON PO Take 1 capsule by mouth. Takes occasionally    [provider]  CYANOCOBALAMIN PO Take 1 tablet by mouth daily.    [provider]  diclofenac sodium (VOLTAREN) 1 % GEL APPLY 4 GRAMS TO THE AFFECTED AREA 3 TIMES DAILY AS NEEDED 01/11/19   Tower, Audrie GallusMarne A, MD  escitalopram (LEXAPRO) 10 MG tablet Take 0.5 tablets (5 mg total) by mouth daily. 01/26/20   Tower, Audrie GallusMarne A, MD  KRILL OIL PO Take by mouth. Takes 1 tab occasionally    [provider]  magnesium gluconate (MAGONATE) 500 MG tablet Take 500 mg by mouth daily.    [provider]  Multiple Vitamin (MULTIVITAMIN) capsule Take 1 capsule by mouth daily.    [provider]  Polyethyl Glycol-Propyl Glycol (SYSTANE OP) Apply 1 drop to eye daily as needed (Dry eyes).    [provider]    Family History Family History  Problem Relation Age of Onset  . Cancer Mother        uterine  . Obesity Mother   . Thrombocytopenia Mother   . Diabetes Mother   . Osteoarthritis Mother   . Alcohol abuse Father   . Heart disease Father        CAD in 3440's  . Emphysema Father   . Diabetes Maternal Grandmother        type 2  . Heart disease Paternal Aunt        CAD  . Heart disease Paternal Uncle        CAD  . Osteoarthritis Sister   . Osteoarthritis Maternal Aunt   . Colon cancer Neg Hx   . Breast cancer Neg Hx     Social History Social History    Tobacco Use  . Smoking status: Former Smoker  Packs/day: 1.00    Years: 15.00    Pack years: 15.00    Types: Cigarettes    Quit date: 10/07/1978    Years since quitting: 42.0  . Smokeless tobacco: Never Used  Vaping Use  . Vaping Use: Never used  Substance Use Topics  . Alcohol use: No    Alcohol/week: 0.0 standard drinks  . Drug use: No     Allergies   Amoxicillin, Codeine, Macrobid [nitrofurantoin], Clarithromycin, and Trazodone and nefazodone   Review of Systems Review of Systems   Physical Exam Triage Vital Signs ED Triage Vitals  Enc Vitals Group     BP 10/12/20 1153 108/74     Pulse Rate 10/12/20 1153 79     Resp 10/12/20 1153 18     Temp 10/12/20 1153 97.9 F (36.6 C)     Temp src --      SpO2 10/12/20 1153 95 %     Weight --      Height --      Head Circumference --      Peak Flow --      Pain Score 10/12/20 1152 1     Pain Loc --      Pain Edu? --      Excl. in [place]? --    No data found.  Updated Vital Signs BP 108/74   Pulse 79   Temp 97.9 F (36.6 C)   Resp 18   SpO2 95%   Visual Acuity Right Eye Distance:   Left Eye Distance:   Bilateral Distance:    Right Eye Near:   Left Eye Near:    Bilateral Near:     Physical Exam Vitals and nursing note reviewed.  Constitutional:      General: She is not in acute distress.    Appearance: Normal appearance. She is not ill-appearing, toxic-appearing or diaphoretic.  HENT:     Head: Normocephalic.     Right Ear: Tympanic membrane and ear canal normal.     Left Ear: Tympanic membrane and ear canal normal.     Nose: Congestion present.     Mouth/Throat:     Pharynx: Oropharynx is clear.  Eyes:     Conjunctiva/sclera: Conjunctivae normal.  Cardiovascular:     Rate and Rhythm: Normal rate and regular rhythm.  Pulmonary:     Effort: Pulmonary effort is normal.     Breath sounds: Normal breath sounds.  Musculoskeletal:        General: Normal range of motion.     Cervical back: Normal  range of motion.  Skin:    General: Skin is warm and dry.     Findings: No rash.  Neurological:     Mental Status: She is alert.  Psychiatric:        Mood and Affect: Mood normal.      UC Treatments / Results  Labs (all labs ordered are listed, but only abnormal results are displayed) Labs Reviewed  COVID-19, FLU A+B NAA    EKG   Radiology No results found.  Procedures Procedures (including critical care time)  Medications Ordered in UC Medications - No data to display  Initial Impression / Assessment and Plan / UC Course  I have reviewed the triage vital signs and the nursing notes.  Pertinent labs & imaging results that were available during my care of the patient were reviewed by me and considered in my medical decision making (see chart for details).     Nasal congestion  Most likely viral. Continue Mucinex D. Covid test pending Follow up as needed for continued or worsening symptoms  Final Clinical Impressions(s) / UC Diagnoses   Final diagnoses:  Nasal congestion     Discharge Instructions     This is most likely something viral.  You can continue the Mucinex as needed.  Covid test pending Follow up as needed for continued or worsening symptoms    ED Prescriptions    None     PDMP not reviewed this encounter.   Loura Halt A, NP 10/12/20 1210

## 2020-10-12 NOTE — Telephone Encounter (Signed)
[facility] Day - Client TELEPHONE ADVICE RECORD Access[facility]urse Patient Name: [name] [name] Gender: Female DOB: 1951/02/23 Age: 70 Y 6 M 14 D Return Phone Number: [phone] (Primary) Address: City/State/Zip: [place] [address] Client [facility] Day - Client Client Site [facility] - Day Physician Milinda Antis, Idamae Schuller - MD Contact Type Call Who Is Calling Patient / Member / Family / Caregiver Call Type Triage / Clinical Relationship To Patient Self Return Phone Number [phone] (Primary) Chief Complaint Headache Reason for Call Symptomatic / Request for Health Information Initial Comment [name] states she has cough, fever, headaches. Translation No Nurse Assessment Nurse: Alexander Mt, RN, Nicholaus Bloom Date/Time (Eastern Time): 10/11/2020 9:20:20 AM Confirm and document reason for call. If symptomatic, describe symptoms. ---[name] states she has cough, fever, headache that started on sunday with a scratchy throat. She is having left ear pain. Temp is 99. She is coughing up white sputum. Headache is 6/10. Does the patient have any new or worsening symptoms? ---Yes Will a triage be completed? ---Yes Related visit to physician within the last 2 weeks? ---No Does the PT have any chronic conditions? (i.e. diabetes, asthma, this includes High risk factors for pregnancy, etc.) ---Yes List chronic conditions. ---depression Is this a behavioral health or substance abuse call? ---No Guidelines Guideline Title Affirmed Question Affirmed Notes Nurse Date/Time (Eastern Time) COVID-19 - Diagnosed or Suspected [1] COVID-19 infection suspected by caller or triager AND [2] mild symptoms (cough, fever, or others) AND [3] negative COVID-19 rapid test Graylin Shiver 10/11/2020 9:23:32 AM Disp. Time Lamount Cohen Time) Disposition Final User 10/11/2020 9:27:54 AM Call PCP when Office is Open Yes Alexander Mt, RN, Erasmo Score NOTE: All timestamps  contained within this report are represented as [place] Standard Time. CONFIDENTIALTY NOTICE: This fax transmission is intended only for the addressee. It contains information that is legally privileged, confidential or otherwise protected from use or disclosure. If you are not the intended recipient, you are strictly prohibited from reviewing, disclosing, copying using or disseminating any of this information or taking any action in reliance on or regarding this information. If you have received this fax in error, please notify us immediately by telephone so that we can arrange for its return to [place]. Phone: [phone], Toll-Free: [phone], Fax: [phone] Page: 2 of 2 Call Id: [id number] [name] Disagree/Comply Comply [name] Understands Yes PreDisposition Did not know what to do Care Advice Given Per Guideline CALL PCP WHEN OFFICE IS OPEN: * You need to discuss this with your doctor (or NP/PA) within the next few days. * Call the office when it is open. REASSURANCE AND EDUCATION - SUSPECTED COVID-19 AND NEGATIVE RAPID COVID-19 TEST: * Positive rapid test results are accurate and can be trusted. * Negative rapid test results are usually accurate, but can sometimes be wrong. * An error is more likely with tests performed at home. Rapid tests performed at a test site are usually more accurate. * Your doctor (or NP/PA) can help you decide if another special test (such as a PCR test) is needed. Talk with your doctor about your symptoms. * Here's some care advice to help you and to help prevent others from getting sick. GENERAL CARE ADVICE FOR COVID-19 SYMPTOMS: * Cough: Use cough drops. * The treatment is the same whether you have COVID-19, influenza or some other respiratory virus. * Feeling dehydrated: Drink extra liquids. If the air in your home is dry, use a humidifier. * Fever: For fever over 101 F (38.3  C), take acetaminophen every 4 to 6 hours (Adults 650 mg) OR ibuprofen every 6 to 8  hours (Adults 400 mg). Before taking any medicine, read all the instructions on the package. Do not take aspirin unless your doctor has prescribed it for you. COUGH MEDICINES: * COUGH DROPS: Over-the-counter cough drops can help a lot, especially for mild coughs. They soothe an irritated throat and remove the tickle sensation in the back of the throat. Cough drops are easy to carry with you. HUMIDIFIER: * If the air is dry, use a humidifier in the bedroom. * Dry air makes coughs worse. COUGHING SPELLS: * Drink warm fluids. Inhale warm mist. This can help relax the airway and also loosen up phlegm. PAIN AND FEVER MEDICINES: * ACETAMINOPHEN REGULAR STRENGTH TYLENOL: Take 650 mg (two 325 mg pills) by mouth every 4 to 6 hours as needed. Each Regular Strength Tylenol pill has 325 mg of acetaminophen. The most you should take each day is 3,250 mg (10 pills a day). * IBUPROFEN (E.G., MOTRIN, ADVIL): Take 400 mg (two 200 mg pills) by mouth every 6 hours. The most you should take each day is 1,200 mg (six 200 mg pills), unless your doctor has told you to take more. STOPPING HOME ISOLATION - MUST MEET ALL 3 REQUIREMENTS (CDC): * Fever gone for at least 24 hours off fever-reducing medicines AND * Cough and other symptoms must be improved AND * Symptoms started more than 10 days ago. * If unsure if it is safe for you to leave isolation, check the CDC website or call your doctor (or NP/PA). CALL BACK IF: * Fever over 103 F (39.4 C) * Fever lasts over 3 days * Fever returns after being gone for 24 hours * Chest pain or difficulty breathing occurs * You become worse CARE ADVICE given per COVID-19 - DIAGNOSED OR SUSPECTED (Adult) guideline. Referrals REFERRED TO PCP OFFICE

## 2020-10-12 NOTE — Telephone Encounter (Signed)
Aware, will watch for correspondence  

## 2020-10-14 LAB — COVID-19, FLU A+B NAA
Influenza A, NAA: NOT DETECTED
Influenza B, NAA: NOT DETECTED
SARS-CoV-2, NAA: DETECTED — AB

## 2020-10-18 ENCOUNTER — Other Ambulatory Visit: Payer: Self-pay | Admitting: Family Medicine

## 2020-10-18 DIAGNOSIS — Z1231 Encounter for screening mammogram for malignant neoplasm of breast: Secondary | ICD-10-CM

## 2020-11-29 ENCOUNTER — Ambulatory Visit
Admission: RE | Admit: 2020-11-29 | Discharge: 2020-11-29 | Disposition: A | Discharge status: Home / Self Care | Payer: PPO | Source: Ambulatory Visit | Attending: Family Medicine | Admitting: Family Medicine

## 2020-11-29 ENCOUNTER — Other Ambulatory Visit: Payer: Self-pay

## 2020-11-29 DIAGNOSIS — Z1231 Encounter for screening mammogram for malignant neoplasm of breast: Secondary | ICD-10-CM | POA: Diagnosis not present

## 2021-01-03 ENCOUNTER — Other Ambulatory Visit: Payer: Self-pay | Admitting: Family Medicine

## 2021-01-08 ENCOUNTER — Telehealth: Payer: Self-pay | Admitting: Family Medicine

## 2021-01-08 DIAGNOSIS — Z0000 Encounter for general adult medical examination without abnormal findings: Secondary | ICD-10-CM

## 2021-01-08 DIAGNOSIS — E7800 Pure hypercholesterolemia, unspecified: Secondary | ICD-10-CM

## 2021-01-08 DIAGNOSIS — E559 Vitamin D deficiency, unspecified: Secondary | ICD-10-CM

## 2021-01-08 NOTE — Telephone Encounter (Signed)
-----   Message from Ellamae Sia sent at 01/08/2021 10:57 AM EDT ----- Regarding: Lab orders for Tuesday, 4.5.22 Patient is scheduled for CPX labs, please order future labs, Thanks , Karna Christmas

## 2021-01-23 ENCOUNTER — Other Ambulatory Visit: Payer: PPO

## 2021-01-23 ENCOUNTER — Ambulatory Visit: Payer: PPO

## 2021-01-24 ENCOUNTER — Other Ambulatory Visit: Payer: Self-pay

## 2021-01-24 ENCOUNTER — Ambulatory Visit (INDEPENDENT_AMBULATORY_CARE_PROVIDER_SITE_OTHER): Payer: PPO

## 2021-01-24 DIAGNOSIS — Z0000 Encounter for general adult medical examination without abnormal findings: Secondary | ICD-10-CM

## 2021-01-24 DIAGNOSIS — Z85828 Personal history of other malignant neoplasm of skin: Secondary | ICD-10-CM | POA: Diagnosis not present

## 2021-01-24 DIAGNOSIS — D1801 Hemangioma of skin and subcutaneous tissue: Secondary | ICD-10-CM | POA: Diagnosis not present

## 2021-01-24 DIAGNOSIS — D225 Melanocytic nevi of trunk: Secondary | ICD-10-CM | POA: Diagnosis not present

## 2021-01-24 DIAGNOSIS — D2271 Melanocytic nevi of right lower limb, including hip: Secondary | ICD-10-CM | POA: Diagnosis not present

## 2021-01-24 DIAGNOSIS — L918 Other hypertrophic disorders of the skin: Secondary | ICD-10-CM | POA: Diagnosis not present

## 2021-01-24 DIAGNOSIS — L570 Actinic keratosis: Secondary | ICD-10-CM | POA: Diagnosis not present

## 2021-01-24 DIAGNOSIS — L738 Other specified follicular disorders: Secondary | ICD-10-CM | POA: Diagnosis not present

## 2021-01-24 DIAGNOSIS — L814 Other melanin hyperpigmentation: Secondary | ICD-10-CM | POA: Diagnosis not present

## 2021-01-24 DIAGNOSIS — L821 Other seborrheic keratosis: Secondary | ICD-10-CM | POA: Diagnosis not present

## 2021-01-24 NOTE — Patient Instructions (Signed)
[name] [name] , Thank you for taking time to come for your Medicare Wellness Visit. I appreciate your ongoing commitment to your health goals. Please review the following plan we discussed and let me know if I can assist you in the future.   Screening recommendations/referrals: Colonoscopy: Up to date, completed 05/29/2015, due 05/2025 Mammogram: Up to date, completed 11/29/2020, due 11/2021 Bone Density: Up to date, completed 11/30/2019, due 11/2021 Recommended yearly ophthalmology/optometry visit for glaucoma screening and checkup Recommended yearly dental visit for hygiene and checkup  Vaccinations: Influenza vaccine: due Fall 2022 Pneumococcal vaccine: Completed series Tdap vaccine: Up to date, completed 12/18/2016, due 12/2026 Shingles vaccine: due, check with your insurance regarding coverage if interested    Covid-19:declined  Advanced directives: Advance directive discussed with you today. Even though you declined this today please call our office should you change your mind and we can give you the proper paperwork for you to fill out.  Conditions/risks identified: hypercholesterolemia   Next appointment: Follow up in one year for your annual wellness visit    Preventive Care 70 Years and Older, Female Preventive care refers to lifestyle choices and visits with your health care provider that can promote health and wellness. What does preventive care include?  A yearly physical exam. This is also called an annual well check.  Dental exams once or twice a year.  Routine eye exams. Ask your health care provider how often you should have your eyes checked.  Personal lifestyle choices, including:  Daily care of your teeth and gums.  Regular physical activity.  Eating a healthy diet.  Avoiding tobacco and drug use.  Limiting alcohol use.  Practicing safe sex.  Taking low-dose aspirin every day.  Taking vitamin and mineral supplements as recommended by your health care  provider. What happens during an annual well check? The services and screenings done by your health care provider during your annual well check will depend on your age, overall health, lifestyle risk factors, and family history of disease. Counseling  Your health care provider may ask you questions about your:  Alcohol use.  Tobacco use.  Drug use.  Emotional well-being.  Home and relationship well-being.  Sexual activity.  Eating habits.  History of falls.  Memory and ability to understand (cognition).  Work and work Statistician.  Reproductive health. Screening  You may have the following tests or measurements:  Height, weight, and BMI.  Blood pressure.  Lipid and cholesterol levels. These may be checked every 5 years, or more frequently if you are over 71 years old.  Skin check.  Lung cancer screening. You may have this screening every year starting at age 1 if you have a 30-pack-year history of smoking and currently smoke or have quit within the past 15 years.  Fecal occult blood test (FOBT) of the stool. You may have this test every year starting at age 25.  Flexible sigmoidoscopy or colonoscopy. You may have a sigmoidoscopy every 5 years or a colonoscopy every 10 years starting at age 78.  Hepatitis C blood test.  Hepatitis B blood test.  Sexually transmitted disease (STD) testing.  Diabetes screening. This is done by checking your blood sugar (glucose) after you have not eaten for a while (fasting). You may have this done every 1-3 years.  Bone density scan. This is done to screen for osteoporosis. You may have this done starting at age 36.  Mammogram. This may be done every 1-2 years. Talk to your health care provider about how  often you should have regular mammograms. Talk with your health care provider about your test results, treatment options, and if necessary, the need for more tests. Vaccines  Your health care provider may recommend certain  vaccines, such as:  Influenza vaccine. This is recommended every year.  Tetanus, diphtheria, and acellular pertussis (Tdap, Td) vaccine. You may need a Td booster every 10 years.  Zoster vaccine. You may need this after age 15.  Pneumococcal 13-valent conjugate (PCV13) vaccine. One dose is recommended after age 78.  Pneumococcal polysaccharide (PPSV23) vaccine. One dose is recommended after age 56. Talk to your health care provider about which screenings and vaccines you need and how often you need them. This information is not intended to replace advice given to you by your health care provider. Make sure you discuss any questions you have with your health care provider. Document Released: 10/20/2015 Document Revised: 06/12/2016 Document Reviewed: 07/25/2015 Elsevier Interactive Patient Education  2017 [place] Prevention in the Home Falls can cause injuries. They can happen to people of all ages. There are many things you can do to make your home safe and to help prevent falls. What can I do on the outside of my home?  Regularly fix the edges of walkways and driveways and fix any cracks.  Remove anything that might make you trip as you walk through a door, such as a raised step or threshold.  Trim any bushes or trees on the path to your home.  Use bright outdoor lighting.  Clear any walking paths of anything that might make someone trip, such as rocks or tools.  Regularly check to see if handrails are loose or broken. Make sure that both sides of any steps have handrails.  Any raised decks and porches should have guardrails on the edges.  Have any leaves, snow, or ice cleared regularly.  Use sand or salt on walking paths during winter.  Clean up any spills in your garage right away. This includes oil or grease spills. What can I do in the bathroom?  Use night lights.  Install grab bars by the toilet and in the tub and shower. Do not use towel bars as grab  bars.  Use non-skid mats or decals in the tub or shower.  If you need to sit down in the shower, use a plastic, non-slip stool.  Keep the floor dry. Clean up any water that spills on the floor as soon as it happens.  Remove soap buildup in the tub or shower regularly.  Attach bath mats securely with double-sided non-slip rug tape.  Do not have throw rugs and other things on the floor that can make you trip. What can I do in the bedroom?  Use night lights.  Make sure that you have a light by your bed that is easy to reach.  Do not use any sheets or blankets that are too big for your bed. They should not hang down onto the floor.  Have a firm chair that has side arms. You can use this for support while you get dressed.  Do not have throw rugs and other things on the floor that can make you trip. What can I do in the kitchen?  Clean up any spills right away.  Avoid walking on wet floors.  Keep items that you use a lot in easy-to-reach places.  If you need to reach something above you, use a strong step stool that has a grab bar.  Keep electrical  cords out of the way.  Do not use floor polish or wax that makes floors slippery. If you must use wax, use non-skid floor wax.  Do not have throw rugs and other things on the floor that can make you trip. What can I do with my stairs?  Do not leave any items on the stairs.  Make sure that there are handrails on both sides of the stairs and use them. Fix handrails that are broken or loose. Make sure that handrails are as long as the stairways.  Check any carpeting to make sure that it is firmly attached to the stairs. Fix any carpet that is loose or worn.  Avoid having throw rugs at the top or bottom of the stairs. If you do have throw rugs, attach them to the floor with carpet tape.  Make sure that you have a light switch at the top of the stairs and the bottom of the stairs. If you do not have them, ask someone to add them for  you. What else can I do to help prevent falls?  Wear shoes that:  Do not have high heels.  Have rubber bottoms.  Are comfortable and fit you well.  Are closed at the toe. Do not wear sandals.  If you use a stepladder:  Make sure that it is fully opened. Do not climb a closed stepladder.  Make sure that both sides of the stepladder are locked into place.  Ask someone to hold it for you, if possible.  Clearly mark and make sure that you can see:  Any grab bars or handrails.  First and last steps.  Where the edge of each step is.  Use tools that help you move around (mobility aids) if they are needed. These include:  Canes.  Walkers.  Scooters.  Crutches.  Turn on the lights when you go into a dark area. Replace any light bulbs as soon as they burn out.  Set up your furniture so you have a clear path. Avoid moving your furniture around.  If any of your floors are uneven, fix them.  If there are any pets around you, be aware of where they are.  Review your medicines with your doctor. Some medicines can make you feel dizzy. This can increase your chance of falling. Ask your doctor what other things that you can do to help prevent falls. This information is not intended to replace advice given to you by your health care provider. Make sure you discuss any questions you have with your health care provider. Document Released: 07/20/2009 Document Revised: 02/29/2016 Document Reviewed: 10/28/2014 Elsevier Interactive Patient Education  2017 [employer].

## 2021-01-24 NOTE — Progress Notes (Signed)
Subjective:   [name] [name] is a 70 y.o. female who presents for Medicare Annual (Subsequent) preventive examination.  Review of Systems: N/A      I connected with the patient today by telephone and verified that I am speaking with the correct person using two identifiers. Location patient: home Location nurse: work Persons participating in the telephone visit: patient, nurse.   I discussed the limitations, risks, security and privacy concerns of performing an evaluation and management service by telephone and the availability of in person appointments. I also discussed with the patient that there may be a patient responsible charge related to this service. The patient expressed understanding and verbally consented to this telephonic visit.        Cardiac Risk Factors include: advanced age (>71men, >47 women);Other (see comment), Risk factor comments: hypercholesterolemia     Objective:    Today's Vitals   There is no height or weight on file to calculate BMI.  Advanced Directives 01/24/2021 01/19/2020 09/06/2017 04/06/2017 04/05/2017 05/15/2015 05/10/2015  Does Patient Have a Medical Advance Directive? No No No No No No No  Would patient like information on creating a medical advance directive? No - Patient declined No - Patient declined No - Patient declined - - No - patient declined information No - patient declined information    Current Medications (verified) Outpatient Encounter Medications as of 01/24/2021  Medication Sig  . acyclovir (ZOVIRAX) 200 MG capsule Take 1 capsule (200 mg total) by mouth daily as needed.  Marland Kitchen acyclovir cream (ZOVIRAX) 5 % Apply 1 application topically daily as needed.  . Ascorbic Acid (VITAMIN C PO) Take 1 tablet by mouth daily.  Marland Kitchen aspirin EC 81 MG tablet Take 1 tablet (81 mg total) by mouth daily.  Marland Kitchen atenolol (TENORMIN) 25 MG tablet Take 0.5 tablets (12.5 mg total) by mouth daily as needed (palpatations).  . CALCIUM CARBONATE PO Take 1 tablet by mouth  daily.  . Cholecalciferol (VITAMIN D PO) Take 1 tablet by mouth daily.  Marland Kitchen CINNAMON PO Take 1 capsule by mouth. Takes occasionally  . CYANOCOBALAMIN PO Take 1 tablet by mouth daily.  . diclofenac sodium (VOLTAREN) 1 % GEL APPLY 4 GRAMS TO THE AFFECTED AREA 3 TIMES DAILY AS NEEDED  . escitalopram (LEXAPRO) 10 MG tablet TAKE 1/2 TABLET BY MOUTH DAILY  . KRILL OIL PO Take by mouth. Takes 1 tab occasionally  . magnesium gluconate (MAGONATE) 500 MG tablet Take 500 mg by mouth daily.  . Multiple Vitamin (MULTIVITAMIN) capsule Take 1 capsule by mouth daily.  Vladimir Faster Glycol-Propyl Glycol (SYSTANE OP) Apply 1 drop to eye daily as needed (Dry eyes).   Facility-Administered Encounter Medications as of 01/24/2021  Medication  . gi cocktail (Maalox,Lidocaine,Donnatal)    Allergies (verified) Amoxicillin, Codeine, Macrobid [nitrofurantoin], Clarithromycin, and Trazodone and nefazodone   History: Past Medical History:  Diagnosis Date  . Acute meniscal tear of knee   . Arthritis    "neck; left knee before replacement" (05/10/2015)  . Basal cell cancer 5/08  . Breast cyst    "left"  . Chest pain 9/11   [facility]) and palpitations - ruled out for MI with nl. Echo  . Complication of anesthesia HARD TO WAKE  . Difficulty sleeping   . Eczema   . GERD (gastroesophageal reflux disease)   . Heart palpitations OCCASIONAL--  TAKES ATENOLOL PRN  . Hiatal hernia   . HSV-2 infection    buttocks  . Hyperlipidemia DIET CONTROL  . Melanoma of skin (  [place])   . MVP (mitral valve prolapse)    per pt no treatment needed  . Normal nuclear stress test 07-09-10  . Osteopenia 12/2013   T score -1.6 FRAX 16%/0.8%  . PONV (postoperative nausea and vomiting)    Past Surgical History:  Procedure Laterality Date  . Carotid US  9/05   Mild, recheck 1 year  . Carotid US  10/06   Normal  . CHONDROPLASTY  10/23/2011   Procedure: CHONDROPLASTY;  Surgeon: Gearlean Alf, MD;  Location: [facility];   Service: Orthopedics;  Laterality: Left;  Left medial   . COLONOSCOPY    . ESOPHAGOGASTRODUODENOSCOPY  9/07   Normal  . HYSTEROSCOPY WITH RESECTOSCOPE  2000   POLPECTOMY'S  . JOINT REPLACEMENT    . KNEE ARTHROSCOPY  10/23/2011   Procedure: ARTHROSCOPY KNEE;  Surgeon: Gearlean Alf, MD;  Location: [facility];  Service: Orthopedics;  Laterality: Left;  LEFT KNEE SCOPE WITH DEBRIDEMENT   . MELANOMA EXCISION Right 1995   "side"  . MOHS SURGERY Left    "side of my nose"  . MOLE REMOVAL     "several; all over my body"  . MRI neck  3/00   C4-C5 herniation small stenosis mild C4-5, C5-6  . TONSILLECTOMY    . TOTAL KNEE ARTHROPLASTY Left 11/21/2014   Procedure: LEFT TOTAL KNEE ARTHROPLASTY;  Surgeon: Gearlean Alf, MD;  Location: [facility];  Service: Orthopedics;  Laterality: Left;  . TRANSTHORACIC ECHOCARDIOGRAM  06-29-2010   NORMAL LVSF, EF 55-60%,  MILD MR  . TUBAL LIGATION  1989  . VAGINAL HYSTERECTOMY  2001   complex hyperplasia with mother's history of uterine cancer   Family History  Problem Relation Age of Onset  . Cancer Mother        uterine  . Obesity Mother   . Thrombocytopenia Mother   . Diabetes Mother   . Osteoarthritis Mother   . Alcohol abuse Father   . Heart disease Father        CAD in 52's  . Emphysema Father   . Diabetes Maternal Grandmother        type 2  . Heart disease Paternal Aunt        CAD  . Heart disease Paternal Uncle        CAD  . Osteoarthritis Sister   . Osteoarthritis Maternal Aunt   . Colon cancer Neg Hx   . Breast cancer Neg Hx    Social History   Socioeconomic History  . Marital status: Married    Spouse name: Not on file  . Number of children: Not on file  . Years of education: Not on file  . Highest education level: Not on file  Occupational History  . Not on file  Tobacco Use  . Smoking status: Former Smoker    Packs/day: 1.00    Years: 15.00    Pack years: 15.00    Types: Cigarettes    Quit date:  10/07/1978    Years since quitting: 42.3  . Smokeless tobacco: Never Used  Vaping Use  . Vaping Use: Never used  Substance and Sexual Activity  . Alcohol use: No    Alcohol/week: 0.0 standard drinks  . Drug use: No  . Sexual activity: Yes    Birth control/protection: Post-menopausal, Surgical    Comment: HYST-1st intercourse 70 yo-Fewer than 5 partners  Other Topics Concern  . Not on file  Social History Narrative   Daily caffeine use.  Patient gets regular exercise.   Social Determinants of Health   Financial Resource Strain: Low Risk   . Difficulty of Paying Living Expenses: Not hard at all  Food Insecurity: No Food Insecurity  . Worried About Charity fundraiser in the Last Year: Never true  . Ran Out of Food in the Last Year: Never true  Transportation Needs: No Transportation Needs  . Lack of Transportation (Medical): No  . Lack of Transportation (Non-Medical): No  Physical Activity: Sufficiently Active  . Days of Exercise per Week: 3 days  . Minutes of Exercise per Session: 50 min  Stress: No Stress Concern Present  . Feeling of Stress : Not at all  Social Connections: Not on file    Tobacco Counseling Counseling given: Not Answered   Clinical Intake:  Pre-visit preparation completed: Yes  Pain : No/denies pain     Nutritional Risks: None Diabetes: No  How often do you need to have someone help you when you read instructions, pamphlets, or other written materials from your doctor or pharmacy?: 1 - Never What is the last grade level you completed in school?: 9th  Diabetic: No Nutrition Risk Assessment:  Has the patient had any N/V/D within the last 2 months?  No  Does the patient have any non-healing wounds?  No  Has the patient had any unintentional weight loss or weight gain?  No   Diabetes:  Is the patient diabetic?  No  If diabetic, was a CBG obtained today?  N/A Did the patient bring in their glucometer from home?  N/A How often do you  monitor your CBG's? N/A.   Financial Strains and Diabetes Management:  Are you having any financial strains with the device, your supplies or your medication? n/A.  Does the patient want to be seen by Chronic Care Management for management of their diabetes?  N/A Would the patient like to be referred to a Nutritionist or for Diabetic Management?  N/A Interpreter Needed?: No  Information entered by :: CJohnson, LPN   Activities of Daily Living In your present state of health, do you have any difficulty performing the following activities: 01/24/2021  Hearing? N  Vision? N  Difficulty concentrating or making decisions? N  Walking or climbing stairs? N  Dressing or bathing? N  Doing errands, shopping? N  Preparing Food and eating ? N  Using the Toilet? N  In the past six months, have you accidently leaked urine? N  Do you have problems with loss of bowel control? N  Managing your Medications? N  Managing your Finances? N  Housekeeping or managing your Housekeeping? N  Some recent data might be hidden    Patient Care Team: Tower, Wynelle Fanny, MD as PCP - General  Indicate any recent Medical Services you may have received from other than [facility] providers in the past year (date may be approximate).     Assessment:   This is a routine wellness examination for [place].  Hearing/Vision screen  Hearing Screening   125Hz  250Hz  500Hz  1000Hz  2000Hz  3000Hz  4000Hz  6000Hz  8000Hz   Right ear:           Left ear:           Vision Screening Comments: Patient gets annual eye exams   Dietary issues and exercise activities discussed: Current Exercise Habits: Home exercise routine, Type of exercise: Other - see comments, Time (Minutes): 45, Frequency (Times/Week): 3, Weekly Exercise (Minutes/Week): 135, Intensity: Moderate, Exercise limited by: None identified  Goals    .  Patient Stated     01/19/2020, I will continue to ride my exercise bike 4-5 days a week for 45 minutes.     . Patient Stated      01/24/2021, I will continue to ride my exercise bike 2-3 days a week for 45 minutes.       Depression Screen PHQ 2/9 Scores 01/24/2021 01/19/2020 12/24/2018 12/19/2017 11/29/2013  PHQ - 2 Score 0 0 0 0 0  PHQ- 9 Score 0 0 - - -    Fall Risk Fall Risk  01/24/2021 01/19/2020 12/24/2018 12/19/2017  Falls in the past year? 0 0 0 Yes  Number falls in past yr: 0 0 - 1  Injury with Fall? 0 0 - Yes  Risk for fall due to : Medication side effect Medication side effect - -  Follow up Falls evaluation completed;Falls prevention discussed Falls evaluation completed;Falls prevention discussed - -    FALL RISK PREVENTION PERTAINING TO THE HOME:  Any stairs in or around the home? Yes  If so, are there any without handrails? No  Home free of loose throw rugs in walkways, pet beds, electrical cords, etc? Yes  Adequate lighting in your home to reduce risk of falls? Yes   ASSISTIVE DEVICES UTILIZED TO PREVENT FALLS:  Life alert? No  Use of a cane, walker or w/c? No  Grab bars in the bathroom? No  Shower chair or bench in shower? No  Elevated toilet seat or a handicapped toilet? No   TIMED UP AND GO:  Was the test performed? N/A telephone visit .  Cognitive Function: MMSE - Mini Mental State Exam 01/24/2021 01/19/2020  Not completed: Refused -  Orientation to time - 5  Orientation to Place - 5  Registration - 3  Attention/ Calculation - 5  Recall - 3  Language- repeat - 1  Mini Cog  Mini-Cog screen was not completed. Patient refused. Maximum score is 22. A value of 0 denotes this part of the MMSE was not completed or the patient failed this part of the Mini-Cog screening.       Immunizations Immunization History  Administered Date(s) Administered  . Influenza Split 10/12/2012  . Influenza,inj,Quad PF,6+ Mos 07/22/2014, 10/06/2015  . Pneumococcal Conjugate-13 12/19/2017  . Pneumococcal Polysaccharide-23 12/24/2018  . Td 01/27/2006  . Tdap 12/18/2016  . Zoster 11/29/2013    TDAP  status: Up to date  Flu Vaccine status: due Fall 2022  Pneumococcal vaccine status: Up to date  Covid-19 vaccine status: Declined, Education has been provided regarding the importance of this vaccine but patient still declined. Advised may receive this vaccine at local pharmacy or Health Dept.or vaccine clinic. Aware to provide a copy of the vaccination record if obtained from local pharmacy or Health Dept. Verbalized acceptance and understanding.  Qualifies for Shingles Vaccine? Yes   Zostavax completed Yes   Shingrix Completed?: No.    Education has been provided regarding the importance of this vaccine. Patient has been advised to call insurance company to determine out of pocket expense if they have not yet received this vaccine. Advised may also receive vaccine at local pharmacy or Health Dept. Verbalized acceptance and understanding.  Screening Tests Health Maintenance  Topic Date Due  . COVID-19 Vaccine (1) 03/05/2021 (Originally 03/28/1956)  . INFLUENZA VACCINE  05/07/2021  . MAMMOGRAM  11/29/2022  . COLONOSCOPY (Pts 45-76yrs Insurance coverage will need to be confirmed)  05/28/2025  . TETANUS/TDAP  12/19/2026  . DEXA SCAN  Completed  . Hepatitis C  Screening  Completed  . PNA vac Low Risk Adult  Completed  . HPV VACCINES  Aged Out    Health Maintenance  There are no preventive care reminders to display for this patient.  Colorectal cancer screening: Type of screening: Colonoscopy. Completed 05/29/2015. Repeat every 10 years  Mammogram status: Completed 11/29/2020. Repeat every year  Bone Density status: Completed 11/30/2019. Results reflect: Bone density results: OSTEOPENIA. Repeat every 2 years.  Lung Cancer Screening: (Low Dose CT Chest recommended if Age 17-80 years, 30 pack-year currently smoking OR have quit w/in 15years.) does not qualify.   Additional Screening:  Hepatitis C Screening: does qualify; Completed 09/28/2015  Vision Screening: Recommended annual  ophthalmology exams for early detection of glaucoma and other disorders of the eye. Is the patient up to date with their annual eye exam?  Yes  Who is the provider or what is the name of the office in which the patient attends annual eye exams? Dr. Heather [place] If pt is not established with a provider, would they like to be referred to a provider to establish care? No .   Dental Screening: Recommended annual dental exams for proper oral hygiene  Community Resource Referral / Chronic Care Management: CRR required this visit?  No   CCM required this visit?  No      Plan:     I have personally reviewed and noted the following in the patient's chart:   . Medical and social history . Use of alcohol, tobacco or illicit drugs  . Current medications and supplements . Functional ability and status . Nutritional status . Physical activity . Advanced directives . List of other physicians . Hospitalizations, surgeries, and ER visits in previous 12 months . Vitals . Screenings to include cognitive, depression, and falls . Referrals and appointments  In addition, I have reviewed and discussed with patient certain preventive protocols, quality metrics, and best practice recommendations. A written personalized care plan for preventive services as well as general preventive health recommendations were provided to patient.   Due to this being a telephonic visit, the after visit summary with patients personalized plan was offered to patient via office or my-chart. Patient preferred to pick up at office at next visit or via mychart.   Andrez Grime, LPN   7/68/1157

## 2021-01-24 NOTE — Progress Notes (Signed)
PCP notes:  Health Maintenance: Covid- declined   Abnormal Screenings: none   Patient concerns: none   Nurse concerns: none   Next PCP appt.: 02/13/2021 @ 10 am

## 2021-01-30 ENCOUNTER — Encounter: Payer: PPO | Admitting: Family Medicine

## 2021-02-06 ENCOUNTER — Other Ambulatory Visit (INDEPENDENT_AMBULATORY_CARE_PROVIDER_SITE_OTHER): Payer: PPO

## 2021-02-06 ENCOUNTER — Other Ambulatory Visit: Payer: Self-pay

## 2021-02-06 DIAGNOSIS — Z0000 Encounter for general adult medical examination without abnormal findings: Secondary | ICD-10-CM

## 2021-02-06 DIAGNOSIS — E559 Vitamin D deficiency, unspecified: Secondary | ICD-10-CM

## 2021-02-06 DIAGNOSIS — E7800 Pure hypercholesterolemia, unspecified: Secondary | ICD-10-CM

## 2021-02-06 LAB — CBC WITH DIFFERENTIAL/PLATELET
Basophils Absolute: 0 10*3/uL (ref 0.0–0.1)
Basophils Relative: 0.4 % (ref 0.0–3.0)
Eosinophils Absolute: 0.1 10*3/uL (ref 0.0–0.7)
Eosinophils Relative: 1.7 % (ref 0.0–5.0)
HCT: 43.5 % (ref 36.0–46.0)
Hemoglobin: 15 g/dL (ref 12.0–15.0)
Lymphocytes Relative: 34 % (ref 12.0–46.0)
Lymphs Abs: 1.5 10*3/uL (ref 0.7–4.0)
MCHC: 34.5 g/dL (ref 30.0–36.0)
MCV: 95.5 fl (ref 78.0–100.0)
Monocytes Absolute: 0.3 10*3/uL (ref 0.1–1.0)
Monocytes Relative: 6.4 % (ref 3.0–12.0)
Neutro Abs: 2.5 10*3/uL (ref 1.4–7.7)
Neutrophils Relative %: 57.5 % (ref 43.0–77.0)
Platelets: 186 10*3/uL (ref 150.0–400.0)
RBC: 4.56 Mil/uL (ref 3.87–5.11)
RDW: 13.5 % (ref 11.5–15.5)
WBC: 4.4 10*3/uL (ref 4.0–10.5)

## 2021-02-06 LAB — LIPID PANEL
Cholesterol: 195 mg/dL (ref 0–200)
HDL: 53.9 mg/dL (ref >39.00)
LDL Cholesterol: 117 mg/dL — ABNORMAL HIGH (ref 0–99)
NonHDL: 141.01
Total CHOL/HDL Ratio: 4
Triglycerides: 118 mg/dL (ref 0.0–149.0)
VLDL: 23.6 mg/dL (ref 0.0–40.0)

## 2021-02-06 LAB — COMPREHENSIVE METABOLIC PANEL
ALT: 23 U/L (ref 0–35)
AST: 25 U/L (ref 0–37)
Albumin: 4.3 g/dL (ref 3.5–5.2)
Alkaline Phosphatase: 77 U/L (ref 39–117)
BUN: 17 mg/dL (ref 6–23)
CO2: 28 mEq/L (ref 19–32)
Calcium: 9.7 mg/dL (ref 8.4–10.5)
Chloride: 103 mEq/L (ref 96–112)
Creatinine, Ser: 0.76 mg/dL (ref 0.40–1.20)
GFR: 80.27 mL/min (ref >60.00)
Glucose, Bld: 83 mg/dL (ref 70–99)
Potassium: 4 mEq/L (ref 3.5–5.1)
Sodium: 140 mEq/L (ref 135–145)
Total Bilirubin: 0.9 mg/dL (ref 0.2–1.2)
Total Protein: 6.7 g/dL (ref 6.0–8.3)

## 2021-02-06 LAB — VITAMIN D 25 HYDROXY (VIT D DEFICIENCY, FRACTURES): VITD: 78.19 ng/mL (ref 30.00–100.00)

## 2021-02-06 LAB — TSH: TSH: 1.62 u[IU]/mL (ref 0.35–4.50)

## 2021-02-13 ENCOUNTER — Encounter: Payer: Self-pay | Admitting: Family Medicine

## 2021-02-13 ENCOUNTER — Other Ambulatory Visit: Payer: Self-pay

## 2021-02-13 ENCOUNTER — Ambulatory Visit (INDEPENDENT_AMBULATORY_CARE_PROVIDER_SITE_OTHER): Payer: PPO | Admitting: Family Medicine

## 2021-02-13 VITALS — BP 104/66 | HR 76 | Temp 96.9°F | Ht 64.25 in | Wt 180.6 lb

## 2021-02-13 DIAGNOSIS — M8589 Other specified disorders of bone density and structure, multiple sites: Secondary | ICD-10-CM

## 2021-02-13 DIAGNOSIS — E7800 Pure hypercholesterolemia, unspecified: Secondary | ICD-10-CM | POA: Diagnosis not present

## 2021-02-13 DIAGNOSIS — E559 Vitamin D deficiency, unspecified: Secondary | ICD-10-CM

## 2021-02-13 DIAGNOSIS — Z0000 Encounter for general adult medical examination without abnormal findings: Secondary | ICD-10-CM

## 2021-02-13 DIAGNOSIS — F419 Anxiety disorder, unspecified: Secondary | ICD-10-CM

## 2021-02-13 MED ORDER — ACYCLOVIR 5 % EX CREA: 1.0000 "application " | TOPICAL_CREAM | Freq: Every day | CUTANEOUS | 5 refills | Status: DC | PRN

## 2021-02-13 MED ORDER — ATENOLOL 25 MG PO TABS: 12.5000 mg | ORAL_TABLET | Freq: Every day | ORAL | 5 refills | Status: DC | PRN

## 2021-02-13 MED ORDER — ESCITALOPRAM OXALATE 10 MG PO TABS: 0.5000 | ORAL_TABLET | Freq: Every day | ORAL | 3 refills | Status: DC

## 2021-02-13 MED ORDER — ACYCLOVIR 200 MG PO CAPS: 200.0000 mg | ORAL_CAPSULE | Freq: Every day | ORAL | 1 refills | Status: DC | PRN

## 2021-02-13 NOTE — Assessment & Plan Note (Signed)
Reviewed health habits including diet and exercise and skin cancer prevention Reviewed appropriate screening tests for age  Also reviewed health mt list, fam hx and immunization status , as well as social and family history   See HPI amw reviewed  Labs reviewed  Has had covid, vaccine encouraged Mammogram and colonoscopy utd  Mood is fairly good

## 2021-02-13 NOTE — Progress Notes (Signed)
Subjective:    Patient ID: [name] [name], female    DOB: 06-12-1951, 70 y.o.   MRN: [id number]  This visit occurred during the SARS-CoV-2 public health emergency.  Safety protocols were in place, including screening questions prior to the visit, additional usage of staff PPE, and extensive cleaning of exam room while observing appropriate contact time as indicated for disinfecting solutions.    HPI Here for health maintenance exam and to review chronic medical problems    Wt Readings from Last 3 Encounters:  02/13/21 180 lb 9 oz (81.9 kg)  01/26/20 183 lb (83 kg)  01/19/20 175 lb (79.4 kg)   30.75 kg/m   Feeling ok overall  Gets tired more easily  Not as much exercise  Would like to walk- has knee problems but does ride exercise bike   covid status not immunized Has had covid   Had amw on 4/20 No gaps noted   Mammogram 2/22 Self breast exam- no lumps   Her gyn retired- Dr Phineas Real  Had hysterectomy for polyps   Colonoscopy 8/16  dexa 2/21 Osteopenia Falls none Fractures none  Supplements  D level of 78 -good  (5000 iu daily)  Exercise - bike   BP Readings from Last 3 Encounters:  02/13/21 104/66  10/12/20 108/74  01/26/20 130/72   Pulse Readings from Last 3 Encounters:  02/13/21 76  10/12/20 79  01/26/20 75   Hyperlipidemia Lab Results  Component Value Date   CHOL 195 02/06/2021   CHOL 182 01/18/2020   CHOL 186 12/17/2018   Lab Results  Component Value Date   HDL 53.90 02/06/2021   HDL 43.50 01/18/2020   HDL 55.30 12/17/2018   Lab Results  Component Value Date   LDLCALC 117 (H) 02/06/2021   LDLCALC 111 (H) 01/18/2020   LDLCALC 110 (H) 12/17/2018   Lab Results  Component Value Date   TRIG 118.0 02/06/2021   TRIG 139.0 01/18/2020   TRIG 107.0 12/17/2018   Lab Results  Component Value Date   CHOLHDL 4 02/06/2021   CHOLHDL 4 01/18/2020   CHOLHDL 3 12/17/2018   Lab Results  Component Value Date   LDLDIRECT 124.4 09/12/2010    Takes omega 3 No fried foods  Eats [place] sausage  Beef- eats 93 % lean  Eats fish    Mother,sister have high cholesterol  Father- MI / (smoked and drank)   The 10-year ASCVD risk score Mikey Bussing DC Jr., et al., 2013) is: 12.8%   Values used to calculate the score:     Age: 64 years     Sex: Female     Is Non-Hispanic African American: No     Diabetic: Yes     Tobacco smoker: No     Systolic Blood Pressure: 182 mmHg     Is BP treated: Yes     HDL Cholesterol: 53.9 mg/dL     Total Cholesterol: 195 mg/dL   Mood lexapro 10 mg -takes 1/2 tab daily  Would like to get off of it  Does not think   Other labs Lab Results  Component Value Date   CREATININE 0.76 02/06/2021   BUN 17 02/06/2021   NA 140 02/06/2021   K 4.0 02/06/2021   CL 103 02/06/2021   CO2 28 02/06/2021   Lab Results  Component Value Date   ALT 23 02/06/2021   AST 25 02/06/2021   ALKPHOS 77 02/06/2021   BILITOT 0.9 02/06/2021    Lab Results  Component Value  Date   TSH 1.62 02/06/2021    Lab Results  Component Value Date   WBC 4.4 02/06/2021   HGB 15.0 02/06/2021   HCT 43.5 02/06/2021   MCV 95.5 02/06/2021   PLT 186.0 02/06/2021   Patient Active Problem List   Diagnosis Date Noted  . Medicare annual wellness visit, initial 12/19/2017  . Cough 12/19/2017  . Anxiety 09/24/2017  . Sleep disorder 09/12/2017  . Urinary frequency 05/02/2017  . Palpitations 05/22/2015  . OA (osteoarthritis) of knee 11/21/2014  . Colon cancer screening 11/29/2013  . Routine general medical examination at a health care facility 09/16/2011  . DERMATITIS, ATOPIC 11/27/2010  . URTICARIA DUE TO COLD OR HEAT 11/27/2010  . STRESS REACTION, ACUTE, WITH EMOTIONAL DISTURBANCE 09/14/2010  . THROAT PAIN, CHRONIC 09/14/2010  . EXTERNAL HEMORRHOIDS WITHOUT MENTION COMP 06/25/2010  . ABNORMAL EKG 12/26/2009  . OTHER CHRONIC SINUSITIS 10/19/2009  . Vitamin D deficiency 04/21/2009  . ALLERGIC RHINITIS 11/09/2008  . GERD  07/28/2008  . Osteopenia 03/14/2008  . HYPERCHOLESTEROLEMIA, PURE 03/13/2007  . HIATAL HERNIA 01/12/2007   Past Medical History:  Diagnosis Date  . Acute meniscal tear of knee   . Arthritis    "neck; left knee before replacement" (05/10/2015)  . Basal cell cancer 5/08  . Breast cyst    "left"  . Chest pain 9/11   [facility]) and palpitations - ruled out for MI with nl. Echo  . Complication of anesthesia HARD TO WAKE  . Difficulty sleeping   . Eczema   . GERD (gastroesophageal reflux disease)   . Heart palpitations OCCASIONAL--  TAKES ATENOLOL PRN  . Hiatal hernia   . HSV-2 infection    buttocks  . Hyperlipidemia DIET CONTROL  . Melanoma of skin (HCC)   . MVP (mitral valve prolapse)    per pt no treatment needed  . Normal nuclear stress test 07-09-10  . Osteopenia 12/2013   T score -1.6 FRAX 16%/0.8%  . PONV (postoperative nausea and vomiting)    Past Surgical History:  Procedure Laterality Date  . Carotid US  9/05   Mild, recheck 1 year  . Carotid US  10/06   Normal  . CHONDROPLASTY  10/23/2011   Procedure: CHONDROPLASTY;  Surgeon: Loanne Drilling, MD;  Location: [facility];  Service: Orthopedics;  Laterality: Left;  Left medial   . COLONOSCOPY    . ESOPHAGOGASTRODUODENOSCOPY  9/07   Normal  . HYSTEROSCOPY WITH RESECTOSCOPE  2000   POLPECTOMY'S  . JOINT REPLACEMENT    . KNEE ARTHROSCOPY  10/23/2011   Procedure: ARTHROSCOPY KNEE;  Surgeon: Loanne Drilling, MD;  Location: [facility];  Service: Orthopedics;  Laterality: Left;  LEFT KNEE SCOPE WITH DEBRIDEMENT   . MELANOMA EXCISION Right 1995   "side"  . MOHS SURGERY Left    "side of my nose"  . MOLE REMOVAL     "several; all over my body"  . MRI neck  3/00   C4-C5 herniation small stenosis mild C4-5, C5-6  . TONSILLECTOMY    . TOTAL KNEE ARTHROPLASTY Left 11/21/2014   Procedure: LEFT TOTAL KNEE ARTHROPLASTY;  Surgeon: Loanne Drilling, MD;  Location: [facility];  Service: Orthopedics;   Laterality: Left;  . TRANSTHORACIC ECHOCARDIOGRAM  06-29-2010   NORMAL LVSF, EF 55-60%,  MILD MR  . TUBAL LIGATION  1989  . VAGINAL HYSTERECTOMY  2001   complex hyperplasia with mother's history of uterine cancer   Social History   Tobacco Use  .  Smoking status: Former Smoker    Packs/day: 1.00    Years: 15.00    Pack years: 15.00    Types: Cigarettes    Quit date: 10/07/1978    Years since quitting: 42.3  . Smokeless tobacco: Never Used  Vaping Use  . Vaping Use: Never used  Substance Use Topics  . Alcohol use: No    Alcohol/week: 0.0 standard drinks  . Drug use: No   Family History  Problem Relation Age of Onset  . Cancer Mother        uterine  . Obesity Mother   . Thrombocytopenia Mother   . Diabetes Mother   . Osteoarthritis Mother   . Alcohol abuse Father   . Heart disease Father        CAD in 97's  . Emphysema Father   . Diabetes Maternal Grandmother        type 2  . Heart disease Paternal Aunt        CAD  . Heart disease Paternal Uncle        CAD  . Osteoarthritis Sister   . Osteoarthritis Maternal Aunt   . Colon cancer Neg Hx   . Breast cancer Neg Hx    Allergies  Allergen Reactions  . Amoxicillin Rash  . Codeine Rash  . Macrobid [Nitrofurantoin]     GI issues  . Clarithromycin Other (See Comments)    REACTION: reaction not known  . Trazodone And Nefazodone Palpitations   Current Outpatient Medications on File Prior to Visit  Medication Sig Dispense Refill  . Ascorbic Acid (VITAMIN C PO) Take 1 tablet by mouth daily.    Marland Kitchen aspirin EC 81 MG tablet Take 1 tablet (81 mg total) by mouth daily.    Marland Kitchen CALCIUM CARBONATE PO Take 1 tablet by mouth daily.    . Cholecalciferol (VITAMIN D PO) Take 1 tablet by mouth daily.    Marland Kitchen CINNAMON PO Take 1 capsule by mouth. Takes occasionally    . CYANOCOBALAMIN PO Take 1 tablet by mouth daily.    . diclofenac sodium (VOLTAREN) 1 % GEL APPLY 4 GRAMS TO THE AFFECTED AREA 3 TIMES DAILY AS NEEDED 100 g 3  . KRILL OIL PO  Take by mouth. Takes 1 tab occasionally    . magnesium gluconate (MAGONATE) 500 MG tablet Take 500 mg by mouth daily.    . Multiple Vitamin (MULTIVITAMIN) capsule Take 1 capsule by mouth daily.    Vladimir Faster Glycol-Propyl Glycol (SYSTANE OP) Apply 1 drop to eye daily as needed (Dry eyes).     Current Facility-Administered Medications on File Prior to Visit  Medication Dose Route Frequency Provider Last Rate Last Admin  . gi cocktail (Maalox,Lidocaine,Donnatal)  30 mL Oral Once Briscoe Deutscher, DO         Review of Systems  Constitutional: Positive for fatigue. Negative for activity change, appetite change, fever and unexpected weight change.  HENT: Negative for congestion, ear pain, rhinorrhea, sinus pressure and sore throat.   Eyes: Negative for pain, redness and visual disturbance.  Respiratory: Negative for cough, shortness of breath and wheezing.   Cardiovascular: Negative for chest pain and palpitations.  Gastrointestinal: Negative for abdominal pain, blood in stool, constipation and diarrhea.  Endocrine: Negative for polydipsia and polyuria.  Genitourinary: Negative for dysuria, frequency and urgency.  Musculoskeletal: Positive for arthralgias. Negative for back pain and myalgias.  Skin: Negative for pallor and rash.  Allergic/Immunologic: Negative for environmental allergies.  Neurological: Negative for dizziness, syncope and headaches.  Hematological: Negative for adenopathy. Does not bruise/bleed easily.  Psychiatric/Behavioral: Negative for decreased concentration and dysphoric mood. The patient is not nervous/anxious.        Objective:   Physical Exam Constitutional:      General: She is not in acute distress.    Appearance: Normal appearance. She is well-developed. She is obese. She is not ill-appearing or diaphoretic.  HENT:     Head: Normocephalic and atraumatic.     Right Ear: Tympanic membrane, ear canal and external ear normal.     Left Ear: Tympanic membrane, ear  canal and external ear normal.     Nose: Nose normal. No congestion.     Mouth/Throat:     Mouth: Mucous membranes are moist.     Pharynx: Oropharynx is clear. No posterior oropharyngeal erythema.  Eyes:     General: No scleral icterus.    Extraocular Movements: Extraocular movements intact.     Conjunctiva/sclera: Conjunctivae normal.     Pupils: Pupils are equal, round, and reactive to light.  Neck:     Thyroid: No thyromegaly.     Vascular: No carotid bruit or JVD.  Cardiovascular:     Rate and Rhythm: Normal rate and regular rhythm.     Pulses: Normal pulses.     Heart sounds: Normal heart sounds. No gallop.   Pulmonary:     Effort: Pulmonary effort is normal. No respiratory distress.     Breath sounds: Normal breath sounds. No wheezing.     Comments: Good air exch Chest:     Chest wall: No tenderness.  Abdominal:     General: Bowel sounds are normal. There is no distension or abdominal bruit.     Palpations: Abdomen is soft. There is no mass.     Tenderness: There is no abdominal tenderness.     Hernia: No hernia is present.  Genitourinary:    Comments: Breast exam: No mass, nodules, thickening, tenderness, bulging, retraction, inflamation, nipple discharge or skin changes noted.  No axillary or clavicular LA.     Musculoskeletal:        General: No tenderness. Normal range of motion.     Cervical back: Normal range of motion and neck supple. No rigidity. No muscular tenderness.     Right lower leg: No edema.     Left lower leg: No edema.     Comments: No kyphosis  Scoliosis baseline  Lymphadenopathy:     Cervical: No cervical adenopathy.  Skin:    General: Skin is warm and dry.     Coloration: Skin is not pale.     Findings: No erythema or rash.     Comments: Solar lentigines diffusely Some skin tags  Neurological:     Mental Status: She is alert. Mental status is at baseline.     Cranial Nerves: No cranial nerve deficit.     Motor: No abnormal muscle tone.      Coordination: Coordination normal.     Gait: Gait normal.     Deep Tendon Reflexes: Reflexes are normal and symmetric. Reflexes normal.  Psychiatric:        Mood and Affect: Mood normal.        Cognition and Memory: Cognition and memory normal.           Assessment & Plan:   Problem List Items Addressed This Visit      Musculoskeletal and Integument   Osteopenia    dexa 2/21 No falls or fx D level is good  at 74 Continues 5000 iu daily  Rides bike for exercise  Next dexa due after 2/23         Other   Vitamin D deficiency    Good level of 78 Plans to continue 5000 iu daily of D3 otc Vitamin D level is therapeutic with current supplementation Disc importance of this to bone and overall health       HYPERCHOLESTEROLEMIA, PURE    Stable lipids with LDL of 117 Taking omega 3 and avoids fatty foods  Disc goals for lipids and reasons to control them Rev last labs with pt Rev low sat fat diet in detail       Relevant Medications   atenolol (TENORMIN) 25 MG tablet   Routine general medical examination at a health care facility - Primary    Reviewed health habits including diet and exercise and skin cancer prevention Reviewed appropriate screening tests for age  Also reviewed health mt list, fam hx and immunization status , as well as social and family history   See HPI amw reviewed  Labs reviewed  Has had covid, vaccine encouraged Mammogram and colonoscopy utd  Mood is fairly good       Anxiety    Pt thinks she may be able to come off of low dose lexapro 5 mg daily  She will do when ready No need to taper due to low dose Disc expectations  Reviewed stressors/ coping techniques/symptoms/ support sources/ tx options and side effects in detail today       Relevant Medications   escitalopram (LEXAPRO) 10 MG tablet

## 2021-02-13 NOTE — Assessment & Plan Note (Signed)
Stable lipids with LDL of 117 Taking omega 3 and avoids fatty foods  Disc goals for lipids and reasons to control them Rev last labs with pt Rev low sat fat diet in detail

## 2021-02-13 NOTE — Assessment & Plan Note (Signed)
dexa 2/21 No falls or fx D level is good at 78 Continues 5000 iu daily  Rides bike for exercise  Next dexa due after 2/23

## 2021-02-13 NOTE — Assessment & Plan Note (Signed)
Good level of 78 Plans to continue 5000 iu daily of D3 otc Vitamin D level is therapeutic with current supplementation Disc importance of this to bone and overall health

## 2021-02-13 NOTE — Assessment & Plan Note (Signed)
Pt thinks she may be able to come off of low dose lexapro 5 mg daily  She will do when ready No need to taper due to low dose Disc expectations  Reviewed stressors/ coping techniques/symptoms/ support sources/ tx options and side effects in detail today

## 2021-02-13 NOTE — Patient Instructions (Addendum)
Avoid red meat/ fried foods/ egg yolks/ fatty breakfast meats/ butter, cheese and high fat dairy/ and shellfish    When you cook with ground beef -try ground [place]   If you want to-  You can stop your lexapro without tapering because it is such a low dose  For now I will refill until you decide   Take care of yourself  Keep exercising and stay active

## 2021-04-10 ENCOUNTER — Telehealth: Payer: Self-pay | Admitting: *Deleted

## 2021-04-10 NOTE — Telephone Encounter (Signed)
Patient called stating she has what appears to be a boil in her inner thigh asked if she could be seen at this office to for this. I told the patient yes, and transferred her to appointments to schedule.

## 2021-04-17 ENCOUNTER — Ambulatory Visit: Payer: PPO | Admitting: Obstetrics and Gynecology

## 2021-04-17 ENCOUNTER — Other Ambulatory Visit: Payer: Self-pay

## 2021-04-17 VITALS — BP 122/78

## 2021-04-17 DIAGNOSIS — N764 Abscess of vulva: Secondary | ICD-10-CM

## 2021-04-17 MED ORDER — SULFAMETHOXAZOLE-TRIMETHOPRIM 800-160 MG PO TABS: 1.0000 | ORAL_TABLET | Freq: Two times a day (BID) | ORAL | 0 refills | Status: DC

## 2021-04-17 NOTE — Patient Instructions (Signed)
Skin Abscess  A skin abscess is an infected area of your skin that contains pus and other material. An abscess can happen in any part of your body. Some abscesses break open (rupture) on their own. Most continue to get worse unless they are treated. The infection can spread deeper into the body and into your blood, which can makeyou feel sick. A skin abscess is caused by germs that enter the skin through a cut or scrape. It can also be caused by blocked oil and sweat glands or infected hairfollicles. This condition is usually treated by: Draining the pus. Taking antibiotic medicines. Placing a warm, wet washcloth over the abscess. Follow these instructions at home: Medicines  Take over-the-counter and prescription medicines only as told by your doctor. If you were prescribed an antibiotic medicine, take it as told by your doctor. Do not stop taking the antibiotic even if you start to feel better.  Abscess care  If you have an abscess that has not drained, place a warm, clean, wet washcloth over the abscess several times a day. Do this as told by your doctor. Follow instructions from your doctor about how to take care of your abscess. Make sure you: Cover the abscess with a bandage (dressing). Change your bandage or gauze as told by your doctor. Wash your hands with soap and water before you change the bandage or gauze. If you cannot use soap and water, use hand sanitizer. Check your abscess every day for signs that the infection is getting worse. Check for: More redness, swelling, or pain. More fluid or blood. Warmth. More pus or a bad smell.  General instructions To avoid spreading the infection: Do not share personal care items, towels, or hot tubs with others. Avoid making skin-to-skin contact with other people. Keep all follow-up visits as told by your doctor. This is important. Contact a doctor if: You have more redness, swelling, or pain around your abscess. You have more  fluid or blood coming from your abscess. Your abscess feels warm when you touch it. You have more pus or a bad smell coming from your abscess. You have a fever. Your muscles ache. You have chills. You feel sick. Get help right away if: You have very bad (severe) pain. You see red streaks on your skin spreading away from the abscess. Summary A skin abscess is an infected area of your skin that contains pus and other material. The abscess is caused by germs that enter the skin through a cut or scrape. It can also be caused by blocked oil and sweat glands or infected hair follicles. Follow your doctor's instructions on caring for your abscess, taking medicines, preventing infections, and keeping follow-up visits. This information is not intended to replace advice given to you by your health care provider. Make sure you discuss any questions you have with your healthcare provider. Document Revised: 04/29/2019 Document Reviewed: 11/06/2017 Elsevier Patient Education  2022 [employer].

## 2021-04-17 NOTE — Progress Notes (Signed)
GYNECOLOGY  VISIT   HPI: 70 y.o.   Married  Caucasian  female   G3P3 with No LMP recorded. Patient has had a hysterectomy.   here for  boil check between leg and vagina, 2.5 - 3 weeks.   States some blood came out of the area.  No fevers.   Has not had this before.   Not riding bike recently.   No insect bites.   GYNECOLOGIC HISTORY: No LMP recorded. Patient has had a hysterectomy. Contraception:  none Menopausal hormone therapy:  none Last mammogram:  11-29-20 Last pap smear:   04-30-18        OB History     Gravida  3   Para  3   Term      Preterm      AB      Living  3      SAB      IAB      Ectopic      Multiple      Live Births                 Patient Active Problem List   Diagnosis Date Noted   Medicare annual wellness visit, initial 12/19/2017   Cough 12/19/2017   Anxiety 09/24/2017   Sleep disorder 09/12/2017   Urinary frequency 05/02/2017   Palpitations 05/22/2015   OA (osteoarthritis) of knee 11/21/2014   Colon cancer screening 11/29/2013   Routine general medical examination at a health care facility 09/16/2011   DERMATITIS, ATOPIC 11/27/2010   URTICARIA DUE TO COLD OR HEAT 11/27/2010   STRESS REACTION, ACUTE, WITH EMOTIONAL DISTURBANCE 09/14/2010   THROAT PAIN, CHRONIC 09/14/2010   EXTERNAL HEMORRHOIDS WITHOUT MENTION COMP 06/25/2010   ABNORMAL EKG 12/26/2009   OTHER CHRONIC SINUSITIS 10/19/2009   Vitamin D deficiency 04/21/2009   ALLERGIC RHINITIS 11/09/2008   GERD 07/28/2008   Osteopenia 03/14/2008   HYPERCHOLESTEROLEMIA, PURE 03/13/2007   HIATAL HERNIA 01/12/2007    Past Medical History:  Diagnosis Date   Acute meniscal tear of knee    Arthritis    "neck; left knee before replacement" (05/10/2015)   Basal cell cancer 5/08   Breast cyst    "left"   Chest pain 9/11   [facility]) and palpitations - ruled out for MI with nl. Echo   Complication of anesthesia HARD TO WAKE   Difficulty sleeping    Eczema    GERD  (gastroesophageal reflux disease)    Heart palpitations OCCASIONAL--  TAKES ATENOLOL PRN   Hiatal hernia    HSV-2 infection    buttocks   Hyperlipidemia DIET CONTROL   Melanoma of skin (HCC)    MVP (mitral valve prolapse)    per pt no treatment needed   Normal nuclear stress test 07-09-10   Osteopenia 12/2013   T score -1.6 FRAX 16%/0.8%   PONV (postoperative nausea and vomiting)     Past Surgical History:  Procedure Laterality Date   Carotid US  9/05   Mild, recheck 1 year   Carotid US  10/06   Normal   CHONDROPLASTY  10/23/2011   Procedure: CHONDROPLASTY;  Surgeon: Gearlean Alf, MD;  Location: [place];  Service: Orthopedics;  Laterality: Left;  Left medial    COLONOSCOPY     ESOPHAGOGASTRODUODENOSCOPY  9/07   Normal   HYSTEROSCOPY WITH RESECTOSCOPE  2000   POLPECTOMY'S   JOINT REPLACEMENT     KNEE ARTHROSCOPY  10/23/2011   Procedure: ARTHROSCOPY KNEE;  Surgeon: Pilar Plate  Zella Ball, MD;  Location: [place];  Service: Orthopedics;  Laterality: Left;  LEFT KNEE SCOPE WITH DEBRIDEMENT    MELANOMA EXCISION Right 1995   "side"   MOHS SURGERY Left    "side of my nose"   MOLE REMOVAL     "several; all over my body"   MRI neck  3/00   C4-C5 herniation small stenosis mild C4-5, C5-6   TONSILLECTOMY     TOTAL KNEE ARTHROPLASTY Left 11/21/2014   Procedure: LEFT TOTAL KNEE ARTHROPLASTY;  Surgeon: Gearlean Alf, MD;  Location: [facility];  Service: Orthopedics;  Laterality: Left;   TRANSTHORACIC ECHOCARDIOGRAM  06-29-2010   NORMAL LVSF, EF 55-60%,  MILD MR   TUBAL LIGATION  1989   VAGINAL HYSTERECTOMY  2001   complex hyperplasia with mother's history of uterine cancer    Current Outpatient Medications  Medication Sig Dispense Refill   acyclovir (ZOVIRAX) 200 MG capsule Take 1 capsule (200 mg total) by mouth daily as needed. 60 capsule 1   acyclovir cream (ZOVIRAX) 5 % Apply 1 application topically daily as needed. 5 g 5   Ascorbic Acid (VITAMIN  C PO) Take 1 tablet by mouth daily.     atenolol (TENORMIN) 25 MG tablet Take 0.5 tablets (12.5 mg total) by mouth daily as needed (palpatations). 30 tablet 5   CALCIUM CARBONATE PO Take 1 tablet by mouth daily.     Cholecalciferol (VITAMIN D PO) Take 1 tablet by mouth daily.     CINNAMON PO Take 1 capsule by mouth. Takes occasionally     CYANOCOBALAMIN PO Take 1 tablet by mouth daily.     diclofenac sodium (VOLTAREN) 1 % GEL APPLY 4 GRAMS TO THE AFFECTED AREA 3 TIMES DAILY AS NEEDED 100 g 3   escitalopram (LEXAPRO) 10 MG tablet Take 0.5 tablets (5 mg total) by mouth daily. 45 tablet 3   KRILL OIL PO Take by mouth. Takes 1 tab occasionally     magnesium gluconate (MAGONATE) 500 MG tablet Take 500 mg by mouth daily.     Multiple Vitamin (MULTIVITAMIN) capsule Take 1 capsule by mouth daily.     Polyethyl Glycol-Propyl Glycol (SYSTANE OP) Apply 1 drop to eye daily as needed (Dry eyes).     sulfamethoxazole-trimethoprim (BACTRIM DS) 800-160 MG tablet Take 1 tablet by mouth 2 (two) times daily. Take for one week. 14 tablet 0   aspirin EC 81 MG tablet Take 1 tablet (81 mg total) by mouth daily. (Patient not taking: Reported on 04/17/2021)     Current Facility-Administered Medications  Medication Dose Route Frequency Provider Last Rate Last Admin   gi cocktail (Maalox,Lidocaine,Donnatal)  30 mL Oral Once Briscoe Deutscher, DO         ALLERGIES: Amoxicillin, Codeine, Macrobid [nitrofurantoin], Clarithromycin, and Trazodone and nefazodone  Family History  Problem Relation Age of Onset   Cancer Mother        uterine   Obesity Mother    Thrombocytopenia Mother    Diabetes Mother    Osteoarthritis Mother    Alcohol abuse Father    Heart disease Father        CAD in 63's   Emphysema Father    Diabetes Maternal Grandmother        type 2   Heart disease Paternal Aunt        CAD   Heart disease Paternal Uncle        CAD   Osteoarthritis Sister    Osteoarthritis Maternal Aunt  Colon cancer Neg  Hx    Breast cancer Neg Hx     Social History   Socioeconomic History   Marital status: Married    Spouse name: Not on file   Number of children: Not on file   Years of education: Not on file   Highest education level: Not on file  Occupational History   Not on file  Tobacco Use   Smoking status: Former    Packs/day: 1.00    Years: 15.00    Pack years: 15.00    Types: Cigarettes    Quit date: 10/07/1978    Years since quitting: 42.5   Smokeless tobacco: Never  Vaping Use   Vaping Use: Never used  Substance and Sexual Activity   Alcohol use: No    Alcohol/week: 0.0 standard drinks   Drug use: No   Sexual activity: Yes    Birth control/protection: Post-menopausal, Surgical    Comment: HYST-1st intercourse 70 yo-Fewer than 5 partners  Other Topics Concern   Not on file  Social History Narrative   Daily caffeine use.   Patient gets regular exercise.   Social Determinants of Health   Financial Resource Strain: Low Risk    Difficulty of Paying Living Expenses: Not hard at all  Food Insecurity: No Food Insecurity   Worried About Charity fundraiser in the Last Year: Never true   [place] in the Last Year: Never true  Transportation Needs: No Transportation Needs   Lack of Transportation (Medical): No   Lack of Transportation (Non-Medical): No  Physical Activity: Sufficiently Active   Days of Exercise per Week: 3 days   Minutes of Exercise per Session: 50 min  Stress: No Stress Concern Present   Feeling of Stress : Not at all  Social Connections: Not on file  Intimate Partner Violence: Not At Risk   Fear of Current or Ex-Partner: No   Emotionally Abused: No   Physically Abused: No   Sexually Abused: No    Review of Systems  See HPI.   PHYSICAL EXAMINATION:    BP 122/78 (BP Location: Right Arm, Patient Position: Sitting, Cuff Size: Normal)     General appearance: alert, cooperative and appears stated age  No abnormal inguinal nodes.   Pelvic: External  genitalia:  left labia majora abscess, 1 cm with pus draining.               Urethra:  normal appearing urethra with no masses, tenderness or lesions              Bartholins and Skenes: normal               Chaperone was present for exam.  ASSESSMENT  Vulvar abscess.  Draining spontaneously. Possible infected sebaceous cyst.   PLAN  Wound culture.  Bactrim DS po bid x 1 week.  Clean with warm water and soap. Fu in 7 - 9 days. Call if symptoms worsen.

## 2021-04-18 NOTE — Progress Notes (Signed)
GYNECOLOGY  VISIT   HPI: 70 y.o.   Married  Caucasian  female   G3P3 with No LMP recorded. Patient has had a hysterectomy.   here for 1 week follow up on vulvar abscess.  Took last Bactrim DS abx today.  Lump is still there.   Wound culture is negative for specific typed bacteria.  Gram neg bacteria noted.   Likes to ride bike.   GYNECOLOGIC HISTORY: No LMP recorded. Patient has had a hysterectomy. Contraception:  Hyst Menopausal hormone therapy:  none Last mammogram: 11-29-20 3D/Neg/Birads1 Last pap smear: 04-30-18 Neg, 02-04-11 Neg        OB History     Gravida  3   Para  3   Term      Preterm      AB      Living  3      SAB      IAB      Ectopic      Multiple      Live Births                 Patient Active Problem List   Diagnosis Date Noted   Medicare annual wellness visit, initial 12/19/2017   Cough 12/19/2017   Anxiety 09/24/2017   Sleep disorder 09/12/2017   Urinary frequency 05/02/2017   Palpitations 05/22/2015   OA (osteoarthritis) of knee 11/21/2014   Colon cancer screening 11/29/2013   Routine general medical examination at a health care facility 09/16/2011   DERMATITIS, ATOPIC 11/27/2010   URTICARIA DUE TO COLD OR HEAT 11/27/2010   STRESS REACTION, ACUTE, WITH EMOTIONAL DISTURBANCE 09/14/2010   THROAT PAIN, CHRONIC 09/14/2010   EXTERNAL HEMORRHOIDS WITHOUT MENTION COMP 06/25/2010   ABNORMAL EKG 12/26/2009   OTHER CHRONIC SINUSITIS 10/19/2009   Vitamin D deficiency 04/21/2009   ALLERGIC RHINITIS 11/09/2008   GERD 07/28/2008   Osteopenia 03/14/2008   HYPERCHOLESTEROLEMIA, PURE 03/13/2007   HIATAL HERNIA 01/12/2007    Past Medical History:  Diagnosis Date   Acute meniscal tear of knee    Arthritis    "neck; left knee before replacement" (05/10/2015)   Basal cell cancer 5/08   Breast cyst    "left"   Chest pain 9/11   [facility]) and palpitations - ruled out for MI with nl. Echo   Complication of anesthesia HARD TO WAKE    Difficulty sleeping    Eczema    GERD (gastroesophageal reflux disease)    Heart palpitations OCCASIONAL--  TAKES ATENOLOL PRN   Hiatal hernia    HSV-2 infection    buttocks   Hyperlipidemia DIET CONTROL   Melanoma of skin (HCC)    MVP (mitral valve prolapse)    per pt no treatment needed   Normal nuclear stress test 07-09-10   Osteopenia 12/2013   T score -1.6 FRAX 16%/0.8%   PONV (postoperative nausea and vomiting)     Past Surgical History:  Procedure Laterality Date   Carotid US  9/05   Mild, recheck 1 year   Carotid US  10/06   Normal   CHONDROPLASTY  10/23/2011   Procedure: CHONDROPLASTY;  Surgeon: Gearlean Alf, MD;  Location: [place];  Service: Orthopedics;  Laterality: Left;  Left medial    COLONOSCOPY     ESOPHAGOGASTRODUODENOSCOPY  9/07   Normal   HYSTEROSCOPY WITH RESECTOSCOPE  2000   POLPECTOMY'S   JOINT REPLACEMENT     KNEE ARTHROSCOPY  10/23/2011   Procedure: ARTHROSCOPY KNEE;  Surgeon: Dione Plover  Aluisio, MD;  Location: [place];  Service: Orthopedics;  Laterality: Left;  LEFT KNEE SCOPE WITH DEBRIDEMENT    MELANOMA EXCISION Right 1995   "side"   MOHS SURGERY Left    "side of my nose"   MOLE REMOVAL     "several; all over my body"   MRI neck  3/00   C4-C5 herniation small stenosis mild C4-5, C5-6   TONSILLECTOMY     TOTAL KNEE ARTHROPLASTY Left 11/21/2014   Procedure: LEFT TOTAL KNEE ARTHROPLASTY;  Surgeon: Gearlean Alf, MD;  Location[facility];  Service: Orthopedics;  Laterality: Left;   TRANSTHORACIC ECHOCARDIOGRAM  06-29-2010   NORMAL LVSF, EF 55-60%,  MILD MR   TUBAL LIGATION  1989   VAGINAL HYSTERECTOMY  2001   complex hyperplasia with mother's history of uterine cancer    Current Outpatient Medications  Medication Sig Dispense Refill   acyclovir (ZOVIRAX) 200 MG capsule Take 1 capsule (200 mg total) by mouth daily as needed. 60 capsule 1   acyclovir cream (ZOVIRAX) 5 % Apply 1 application topically daily as  needed. 5 g 5   Ascorbic Acid (VITAMIN C PO) Take 1 tablet by mouth daily.     aspirin EC 81 MG tablet Take 1 tablet (81 mg total) by mouth daily.     atenolol (TENORMIN) 25 MG tablet Take 0.5 tablets (12.5 mg total) by mouth daily as needed (palpatations). 30 tablet 5   CALCIUM CARBONATE PO Take 1 tablet by mouth daily.     Cholecalciferol (VITAMIN D PO) Take 1 tablet by mouth daily.     CINNAMON PO Take 1 capsule by mouth. Takes occasionally     CYANOCOBALAMIN PO Take 1 tablet by mouth daily.     diclofenac sodium (VOLTAREN) 1 % GEL APPLY 4 GRAMS TO THE AFFECTED AREA 3 TIMES DAILY AS NEEDED 100 g 3   escitalopram (LEXAPRO) 10 MG tablet Take 0.5 tablets (5 mg total) by mouth daily. 45 tablet 3   KRILL OIL PO Take by mouth. Takes 1 tab occasionally     magnesium gluconate (MAGONATE) 500 MG tablet Take 500 mg by mouth daily.     Multiple Vitamin (MULTIVITAMIN) capsule Take 1 capsule by mouth daily.     Polyethyl Glycol-Propyl Glycol (SYSTANE OP) Apply 1 drop to eye daily as needed (Dry eyes).     sulfamethoxazole-trimethoprim (BACTRIM DS) 800-160 MG tablet Take 1 tablet by mouth 2 (two) times daily. Take for one week. 14 tablet 0   Current Facility-Administered Medications  Medication Dose Route Frequency Provider Last Rate Last Admin   gi cocktail (Maalox,Lidocaine,Donnatal)  30 mL Oral Once Briscoe Deutscher, DO         ALLERGIES: Amoxicillin, Codeine, Macrobid [nitrofurantoin], Clarithromycin, and Trazodone and nefazodone  Family History  Problem Relation Age of Onset   Cancer Mother        uterine   Obesity Mother    Thrombocytopenia Mother    Diabetes Mother    Osteoarthritis Mother    Alcohol abuse Father    Heart disease Father        CAD in 33's   Emphysema Father    Diabetes Maternal Grandmother        type 2   Heart disease Paternal Aunt        CAD   Heart disease Paternal Uncle        CAD   Osteoarthritis Sister    Osteoarthritis Maternal Aunt    Colon cancer Neg Hx  Breast cancer Neg Hx     Social History   Socioeconomic History   Marital status: Married    Spouse name: Not on file   Number of children: Not on file   Years of education: Not on file   Highest education level: Not on file  Occupational History   Not on file  Tobacco Use   Smoking status: Former    Packs/day: 1.00    Years: 15.00    Pack years: 15.00    Types: Cigarettes    Quit date: 10/07/1978    Years since quitting: 42.5   Smokeless tobacco: Never  Vaping Use   Vaping Use: Never used  Substance and Sexual Activity   Alcohol use: No    Alcohol/week: 0.0 standard drinks   Drug use: No   Sexual activity: Yes    Birth control/protection: Post-menopausal, Surgical    Comment: HYST-1st intercourse 70 yo-Fewer than 5 partners  Other Topics Concern   Not on file  Social History Narrative   Daily caffeine use.   Patient gets regular exercise.   Social Determinants of Health   Financial Resource Strain: Low Risk    Difficulty of Paying Living Expenses: Not hard at all  Food Insecurity: No Food Insecurity   Worried About Charity fundraiser in the Last Year: Never true   [place] in the Last Year: Never true  Transportation Needs: No Transportation Needs   Lack of Transportation (Medical): No   Lack of Transportation (Non-Medical): No  Physical Activity: Sufficiently Active   Days of Exercise per Week: 3 days   Minutes of Exercise per Session: 50 min  Stress: No Stress Concern Present   Feeling of Stress : Not at all  Social Connections: Not on file  Intimate Partner Violence: Not At Risk   Fear of Current or Ex-Partner: No   Emotionally Abused: No   Physically Abused: No   Sexually Abused: No    Review of Systems  All other systems reviewed and are negative.  PHYSICAL EXAMINATION:    BP 130/72     General appearance: alert, cooperative and appears stated age Head: Normocephalic, without obvious abnormality, atraumatic Neck: no adenopathy,  supple, symmetrical, trachea midline and thyroid normal to inspection and palpation Lungs: clear to auscultation bilaterally Breasts: normal appearance, no masses or tenderness, No nipple retraction or dimpling, No nipple discharge or bleeding, No axillary or supraclavicular adenopathy Heart: regular rate and rhythm Abdomen: soft, non-tender, no masses,  no organomegaly Extremities: extremities normal, atraumatic, no cyanosis or edema Skin: Skin color, texture, turgor normal. No rashes or lesions Lymph nodes: Cervical, supraclavicular, and axillary nodes normal. No abnormal inguinal nodes palpated Neurologic: Grossly normal  Pelvic: External genitalia: left inferior labia majora with 1 cm cystic area with drainage of pus.              Urethra:  normal appearing urethra with no masses, tenderness or lesions   I and D abscess of vulva Consent done.  Betadine prep.  1% lidocaine local lot [id number], exp 10/2022.  Scalpel used to open skin.   No additional pus noted.  Wound explored with hemostat.  No drainage of sebum either.  No complications.  Minimal EBL. Sterile gauze dressing placed.   Chaperone was present for exam.  ASSESSMENT  Vulvar abscess.  Epidermoid cyst of vulva.   PLAN  No additional abx.  Clean with soap and water.  Will monitor for decrease in inflammation.  If is painful  or increases in size, return for re-evaluation of re-drainage or removal of the cyst.  FU prn.

## 2021-04-19 ENCOUNTER — Encounter: Payer: Self-pay | Admitting: Obstetrics and Gynecology

## 2021-04-20 LAB — WOUND CULTURE
MICRO NUMBER:: 12108919
RESULT:: NORMAL
SPECIMEN QUALITY:: ADEQUATE

## 2021-04-24 ENCOUNTER — Other Ambulatory Visit: Payer: Self-pay

## 2021-04-24 ENCOUNTER — Ambulatory Visit: Payer: PPO | Admitting: Obstetrics and Gynecology

## 2021-04-24 ENCOUNTER — Encounter: Payer: Self-pay | Admitting: Obstetrics and Gynecology

## 2021-04-24 VITALS — BP 130/72

## 2021-04-24 DIAGNOSIS — N764 Abscess of vulva: Secondary | ICD-10-CM | POA: Diagnosis not present

## 2021-04-24 DIAGNOSIS — N907 Vulvar cyst: Secondary | ICD-10-CM

## 2021-04-24 NOTE — Patient Instructions (Signed)
Epidermoid Cyst  An epidermoid cyst, also known as epidermal cyst, is a sac made of skin tissue. The sac contains a substance called keratin. Keratin is a protein that is normally secreted through the hair follicles. When keratin becomes trapped in the top layer of skin (epidermis), it can form an epidermoid cyst. Epidermoid cysts can be found anywhere on your body. These cysts are usually harmless (benign), and they may not cause symptoms unless they become inflamed or infected. What are the causes? This condition may be caused by: A blocked hair follicle. A hair that curls and re-enters the skin instead of growing straight out of the skin (ingrown hair). A blocked pore. Irritated skin. An injury to the skin. Certain conditions that are passed along from parent to child (inherited). Human papillomavirus (HPV). This happens rarely when cysts occur on the bottom of the feet. Long-term (chronic) sun damage to the skin. What increases the risk? The following factors may make you more likely to develop an epidermoid cyst: Having acne. Being female. Having an injury to the skin. Being past puberty. Having certain rare genetic disorders. What are the signs or symptoms? The only symptom of this condition may be a small, painless lump underneath the skin. When an epidermal cyst ruptures, it may become inflamed. True infection in cysts is rare. Symptoms may include: Redness. Inflammation. Tenderness. Warmth. Keratin draining from the cyst. Keratin is grayish-white, bad-smelling substance. Pus draining from the cyst. How is this diagnosed? This condition is diagnosed with a physical exam. In some cases, you may have a sample of tissue (biopsy) taken from your cyst to be examined under a microscope or tested for bacteria. You may be referred to a health care provider who specializes in skin care (dermatologist). How is this treated? If a cyst becomes inflamed, treatment may include: Opening and  draining the cyst, done by a health care provider. After draining, minor surgery to remove the rest of the cyst may be done. Taking antibiotic medicine. Having injections of medicines (steroids) that help to reduce inflammation. Having surgery to remove the cyst. Surgery may be done if the cyst: Becomes large. Bothers you. Has a chance of turning into cancer. Do not try to open a cyst yourself. Follow these instructions at home: Medicines If you were prescribed an antibiotic medicine, take it it as told by your health care provider. Do not stop using the antibiotic even if you start to feel better. Take over-the-counter and prescription medicines only as told by your health care provider. General instructions Keep the area around your cyst clean and dry. Wear loose, dry clothing. Avoid touching your cyst. Check your cyst every day for signs of infection. Check for: Redness, swelling, or pain. Fluid or blood. Warmth. Pus or a bad smell. Keep all follow-up visits. This is important. How is this prevented? Wear clean, dry, clothing. Avoid wearing tight clothing. Keep your skin clean and dry. Take showers or baths every day. Contact a health care provider if: Your cyst develops symptoms of infection. Your condition is not improving or is getting worse. You develop a cyst that looks different from other cysts you have had. You have a fever. Get help right away if: Redness spreads from the cyst into the surrounding area. Summary An epidermoid cyst is a sac made of skin tissue. These cysts are usually harmless (benign), and they may not cause symptoms unless they become inflamed. If a cyst becomes inflamed, treatment may include surgery to open and drain the   cyst, or to remove it. Treatment may also include medicines by mouth or through an injection. Take over-the-counter and prescription medicines only as told by your health care provider. If you were prescribed an antibiotic medicine,  take it as told by your health care provider. Do not stop using the antibiotic even if you start to feel better. Contact a health care provider if your condition is not improving or is getting worse. Keep all follow-up visits as told by your health care provider. This is important. This information is not intended to replace advice given to you by your health care provider. Make sure you discuss any questions you have with your healthcare provider. Document Revised: 12/29/2019 Document Reviewed: 12/29/2019 Elsevier Patient Education  [employer].

## 2021-05-09 DIAGNOSIS — L570 Actinic keratosis: Secondary | ICD-10-CM | POA: Diagnosis not present

## 2021-05-09 DIAGNOSIS — Z85828 Personal history of other malignant neoplasm of skin: Secondary | ICD-10-CM | POA: Diagnosis not present

## 2021-05-09 DIAGNOSIS — D485 Neoplasm of uncertain behavior of skin: Secondary | ICD-10-CM | POA: Diagnosis not present

## 2021-05-12 ENCOUNTER — Other Ambulatory Visit: Payer: Self-pay | Admitting: Family Medicine

## 2021-05-14 NOTE — Telephone Encounter (Signed)
#  60 tabs with 1 refill was given at CPE appt on 02/13/21, please advise

## 2021-06-14 ENCOUNTER — Ambulatory Visit (INDEPENDENT_AMBULATORY_CARE_PROVIDER_SITE_OTHER): Payer: PPO | Admitting: Family Medicine

## 2021-06-14 ENCOUNTER — Other Ambulatory Visit: Payer: Self-pay

## 2021-06-14 ENCOUNTER — Encounter: Payer: Self-pay | Admitting: Family Medicine

## 2021-06-14 VITALS — BP 110/66 | HR 66 | Temp 98.0°F | Ht 64.25 in | Wt 180.1 lb

## 2021-06-14 DIAGNOSIS — R300 Dysuria: Secondary | ICD-10-CM

## 2021-06-14 DIAGNOSIS — N3000 Acute cystitis without hematuria: Secondary | ICD-10-CM

## 2021-06-14 LAB — POC URINALSYSI DIPSTICK (AUTOMATED)
Bilirubin, UA: NEGATIVE
Blood, UA: 50
Glucose, UA: NEGATIVE
Ketones, UA: NEGATIVE
Nitrite, UA: NEGATIVE
Protein, UA: NEGATIVE
Spec Grav, UA: 1.015 (ref 1.010–1.025)
Urobilinogen, UA: 0.2 E.U./dL
pH, UA: 6.5 (ref 5.0–8.0)

## 2021-06-14 MED ORDER — SULFAMETHOXAZOLE-TRIMETHOPRIM 800-160 MG PO TABS: 1.0000 | ORAL_TABLET | Freq: Two times a day (BID) | ORAL | 0 refills | Status: DC

## 2021-06-14 NOTE — Progress Notes (Signed)
Subjective:    Patient ID: Rowe Pavy, female    DOB: [birth year], 70 y.o.   MRN: [id number]  This visit occurred during the SARS-CoV-2 public health emergency.  Safety protocols were in place, including screening questions prior to the visit, additional usage of staff PPE, and extensive cleaning of exam room while observing appropriate contact time as indicated for disinfecting solutions.   HPI Pt presents for urinary symptoms   Wt Readings from Last 3 Encounters:  06/14/21 180 lb 2 oz (81.7 kg)  02/13/21 180 lb 9 oz (81.9 kg)  01/26/20 183 lb (83 kg)   30.68 kg/m  Symptoms started a week ago   Dysuria- took some azo type me over the counter (gave her some relief)   Frequency - has to go all the time   Urgency also  No blood in urine   No nausea  No fever    No flank pain but some back ache  Some bladder discomfort   Results for orders placed or performed in visit on 06/14/21  POCT Urinalysis Dipstick (Automated)  Result Value Ref Range   Color, UA Yellow    Clarity, UA Hazy    Glucose, UA Negative Negative   Bilirubin, UA Negative    Ketones, UA Negative    Spec Grav, UA 1.015 1.010 - 1.025   Blood, UA 50 Ery/uL    pH, UA 6.5 5.0 - 8.0   Protein, UA Negative Negative   Urobilinogen, UA 0.2 0.2 or 1.0 E.U./dL   Nitrite, UA Negative    Leukocytes, UA Large (3+) (A) Negative    Patient Active Problem List   Diagnosis Date Noted   Medicare annual wellness visit, initial 12/19/2017   Cough 12/19/2017   Anxiety 09/24/2017   Sleep disorder 09/12/2017   Urinary frequency 05/02/2017   Acute cystitis 11/15/2016   Palpitations 05/22/2015   OA (osteoarthritis) of knee 11/21/2014   Colon cancer screening 11/29/2013   Routine general medical examination at a health care facility 09/16/2011   DERMATITIS, ATOPIC 11/27/2010   URTICARIA DUE TO COLD OR HEAT 11/27/2010   STRESS REACTION, ACUTE, WITH EMOTIONAL DISTURBANCE 09/14/2010   THROAT PAIN, CHRONIC 09/14/2010    EXTERNAL HEMORRHOIDS WITHOUT MENTION COMP 06/25/2010   ABNORMAL EKG 12/26/2009   OTHER CHRONIC SINUSITIS 10/19/2009   Vitamin D deficiency 04/21/2009   ALLERGIC RHINITIS 11/09/2008   GERD 07/28/2008   Osteopenia 03/14/2008   HYPERCHOLESTEROLEMIA, PURE 03/13/2007   HIATAL HERNIA 01/12/2007   Past Medical History:  Diagnosis Date   Acute meniscal tear of knee    Arthritis    "neck; left knee before replacement" (05/10/2015)   Basal cell cancer 5/08   Breast cyst    "left"   Chest pain 9/11   [facility]) and palpitations - ruled out for MI with nl. Echo   Complication of anesthesia HARD TO WAKE   Difficulty sleeping    Eczema    GERD (gastroesophageal reflux disease)    Heart palpitations OCCASIONAL--  TAKES ATENOLOL PRN   Hiatal hernia    HSV-2 infection    buttocks   Hyperlipidemia DIET CONTROL   Melanoma of skin (HCC)    MVP (mitral valve prolapse)    per pt no treatment needed   Normal nuclear stress test 07-09-10   Osteopenia 12/2013   T score -1.6 FRAX 16%/0.8%   PONV (postoperative nausea and vomiting)    Past Surgical History:  Procedure Laterality Date   Carotid US  9/05   Mild,  recheck 1 year   Carotid US  10/06   Normal   CHONDROPLASTY  10/23/2011   Procedure: CHONDROPLASTY;  Surgeon: Gearlean Alf, MD;  Location: [facility];  Service: Orthopedics;  Laterality: Left;  Left medial    COLONOSCOPY     ESOPHAGOGASTRODUODENOSCOPY  9/07   Normal   HYSTEROSCOPY WITH RESECTOSCOPE  2000   POLPECTOMY'S   JOINT REPLACEMENT     KNEE ARTHROSCOPY  10/23/2011   Procedure: ARTHROSCOPY KNEE;  Surgeon: Gearlean Alf, MD;  Location: [facility];  Service: Orthopedics;  Laterality: Left;  LEFT KNEE SCOPE WITH DEBRIDEMENT    MELANOMA EXCISION Right 1995   "side"   MOHS SURGERY Left    "side of my nose"   MOLE REMOVAL     "several; all over my body"   MRI neck  3/00   C4-C5 herniation small stenosis mild C4-5, C5-6   TONSILLECTOMY      TOTAL KNEE ARTHROPLASTY Left 11/21/2014   Procedure: LEFT TOTAL KNEE ARTHROPLASTY;  Surgeon: Gearlean Alf, MD;  Location: [facility];  Service: Orthopedics;  Laterality: Left;   TRANSTHORACIC ECHOCARDIOGRAM  06-29-2010   NORMAL LVSF, EF 55-60%,  MILD MR   TUBAL LIGATION  1989   VAGINAL HYSTERECTOMY  2001   complex hyperplasia with mother's history of uterine cancer   Social History   Tobacco Use   Smoking status: Former    Packs/day: 1.00    Years: 15.00    Pack years: 15.00    Types: Cigarettes    Quit date: 10/07/1978    Years since quitting: 42.7   Smokeless tobacco: Never  Vaping Use   Vaping Use: Never used  Substance Use Topics   Alcohol use: No    Alcohol/week: 0.0 standard drinks   Drug use: No   Family History  Problem Relation Age of Onset   Cancer Mother        uterine   Obesity Mother    Thrombocytopenia Mother    Diabetes Mother    Osteoarthritis Mother    Alcohol abuse Father    Heart disease Father        CAD in 22's   Emphysema Father    Diabetes Maternal Grandmother        type 2   Heart disease Paternal Aunt        CAD   Heart disease Paternal Uncle        CAD   Osteoarthritis Sister    Osteoarthritis Maternal Aunt    Colon cancer Neg Hx    Breast cancer Neg Hx    Allergies  Allergen Reactions   Amoxicillin Rash   Codeine Rash   Macrobid [Nitrofurantoin]     GI issues   Clarithromycin Other (See Comments)    REACTION: reaction not known   Trazodone And Nefazodone Palpitations   Current Outpatient Medications on File Prior to Visit  Medication Sig Dispense Refill   acyclovir (ZOVIRAX) 200 MG capsule Take 1 capsule (200 mg total) by mouth daily as needed. 90 capsule 1   acyclovir cream (ZOVIRAX) 5 % Apply 1 application topically daily as needed. 5 g 5   Ascorbic Acid (VITAMIN C PO) Take 1 tablet by mouth daily.     aspirin EC 81 MG tablet Take 1 tablet (81 mg total) by mouth daily.     atenolol (TENORMIN) 25 MG tablet Take 0.5 tablets (12.5  mg total) by mouth daily as needed (palpatations). 30 tablet 5  CALCIUM CARBONATE PO Take 1 tablet by mouth daily.     Cholecalciferol (VITAMIN D PO) Take 1 tablet by mouth daily.     CINNAMON PO Take 1 capsule by mouth. Takes occasionally     CYANOCOBALAMIN PO Take 1 tablet by mouth daily.     diclofenac sodium (VOLTAREN) 1 % GEL APPLY 4 GRAMS TO THE AFFECTED AREA 3 TIMES DAILY AS NEEDED 100 g 3   escitalopram (LEXAPRO) 10 MG tablet Take 0.5 tablets (5 mg total) by mouth daily. 45 tablet 3   KRILL OIL PO Take by mouth. Takes 1 tab occasionally     magnesium gluconate (MAGONATE) 500 MG tablet Take 500 mg by mouth daily.     Multiple Vitamin (MULTIVITAMIN) capsule Take 1 capsule by mouth daily.     Polyethyl Glycol-Propyl Glycol (SYSTANE OP) Apply 1 drop to eye daily as needed (Dry eyes).     Current Facility-Administered Medications on File Prior to Visit  Medication Dose Route Frequency Provider Last Rate Last Admin   gi cocktail (Maalox,Lidocaine,Donnatal)  30 mL Oral Once Briscoe Deutscher, DO         Review of Systems  Constitutional:  Positive for fatigue. Negative for activity change, appetite change and fever.  HENT:  Negative for congestion and sore throat.   Eyes:  Negative for itching and visual disturbance.  Respiratory:  Negative for cough and shortness of breath.   Cardiovascular:  Negative for leg swelling.  Gastrointestinal:  Negative for abdominal distention, abdominal pain, constipation, diarrhea and nausea.  Endocrine: Negative for cold intolerance and polydipsia.  Genitourinary:  Positive for dysuria, frequency and urgency. Negative for decreased urine volume, difficulty urinating, flank pain, hematuria and vaginal discharge.  Musculoskeletal:  Negative for myalgias.  Skin:  Negative for rash.  Allergic/Immunologic: Negative for immunocompromised state.  Neurological:  Negative for dizziness and weakness.  Hematological:  Negative for adenopathy.      Objective:    Physical Exam Constitutional:      General: She is not in acute distress.    Appearance: Normal appearance. She is well-developed and normal weight.  HENT:     Head: Normocephalic and atraumatic.  Eyes:     Conjunctiva/sclera: Conjunctivae normal.     Pupils: Pupils are equal, round, and reactive to light.  Cardiovascular:     Rate and Rhythm: Normal rate and regular rhythm.     Heart sounds: Normal heart sounds.  Pulmonary:     Effort: Pulmonary effort is normal.     Breath sounds: Normal breath sounds.  Abdominal:     General: Bowel sounds are normal. There is no distension.     Palpations: Abdomen is soft.     Tenderness: There is abdominal tenderness. There is no rebound.     Comments: No cva tenderness  Mild suprapubic tenderness  Musculoskeletal:     Cervical back: Normal range of motion and neck supple.  Lymphadenopathy:     Cervical: No cervical adenopathy.  Neurological:     Mental Status: She is alert.  Psychiatric:        Mood and Affect: Mood normal.          Assessment & Plan:   Problem List Items Addressed This Visit       Genitourinary   Acute cystitis - Primary    With dysuria and frequency tx with bactrim ds bid for 5 days Fluids-water  Handout given  Update if not starting to improve in a week or if worsening  inst to call if symptoms suddenly worsen  Culture pending, will update with result       Relevant Orders   Urine Culture   Other Visit Diagnoses     Dysuria       Relevant Orders   POCT Urinalysis Dipstick (Automated) (Completed)

## 2021-06-14 NOTE — Assessment & Plan Note (Signed)
With dysuria and frequency tx with bactrim ds bid for 5 days Fluids-water  Handout given  Update if not starting to improve in a week or if worsening  inst to call if symptoms suddenly worsen  Culture pending, will update with result

## 2021-06-14 NOTE — Patient Instructions (Signed)
Drink lots of water  Take the generic bactrim as directed  If suddenly worse - let us know   We will contact you with a culture result

## 2021-06-16 LAB — URINE CULTURE
MICRO NUMBER:: 12347587
SPECIMEN QUALITY:: ADEQUATE

## 2021-06-20 DIAGNOSIS — M1711 Unilateral primary osteoarthritis, right knee: Secondary | ICD-10-CM | POA: Diagnosis not present

## 2021-06-20 DIAGNOSIS — M25561 Pain in right knee: Secondary | ICD-10-CM | POA: Diagnosis not present

## 2021-07-11 DIAGNOSIS — Z85828 Personal history of other malignant neoplasm of skin: Secondary | ICD-10-CM | POA: Diagnosis not present

## 2021-07-11 DIAGNOSIS — L820 Inflamed seborrheic keratosis: Secondary | ICD-10-CM | POA: Diagnosis not present

## 2021-07-11 DIAGNOSIS — D485 Neoplasm of uncertain behavior of skin: Secondary | ICD-10-CM | POA: Diagnosis not present

## 2021-07-11 DIAGNOSIS — L570 Actinic keratosis: Secondary | ICD-10-CM | POA: Diagnosis not present

## 2021-08-01 DIAGNOSIS — L820 Inflamed seborrheic keratosis: Secondary | ICD-10-CM | POA: Diagnosis not present

## 2021-08-01 DIAGNOSIS — Z85828 Personal history of other malignant neoplasm of skin: Secondary | ICD-10-CM | POA: Diagnosis not present

## 2021-08-01 DIAGNOSIS — L570 Actinic keratosis: Secondary | ICD-10-CM | POA: Diagnosis not present

## 2021-08-01 DIAGNOSIS — L821 Other seborrheic keratosis: Secondary | ICD-10-CM | POA: Diagnosis not present

## 2021-09-11 DIAGNOSIS — C44629 Squamous cell carcinoma of skin of left upper limb, including shoulder: Secondary | ICD-10-CM | POA: Diagnosis not present

## 2021-09-11 DIAGNOSIS — Z85828 Personal history of other malignant neoplasm of skin: Secondary | ICD-10-CM | POA: Diagnosis not present

## 2021-11-14 DIAGNOSIS — L910 Hypertrophic scar: Secondary | ICD-10-CM | POA: Diagnosis not present

## 2021-11-14 DIAGNOSIS — L570 Actinic keratosis: Secondary | ICD-10-CM | POA: Diagnosis not present

## 2021-11-14 DIAGNOSIS — D485 Neoplasm of uncertain behavior of skin: Secondary | ICD-10-CM | POA: Diagnosis not present

## 2021-11-14 DIAGNOSIS — L820 Inflamed seborrheic keratosis: Secondary | ICD-10-CM | POA: Diagnosis not present

## 2021-11-14 DIAGNOSIS — Z85828 Personal history of other malignant neoplasm of skin: Secondary | ICD-10-CM | POA: Diagnosis not present

## 2021-11-14 DIAGNOSIS — D0471 Carcinoma in situ of skin of right lower limb, including hip: Secondary | ICD-10-CM | POA: Diagnosis not present

## 2021-11-22 ENCOUNTER — Other Ambulatory Visit: Payer: Self-pay | Admitting: Family Medicine

## 2021-12-11 ENCOUNTER — Encounter: Payer: Self-pay | Admitting: Family Medicine

## 2021-12-11 ENCOUNTER — Other Ambulatory Visit: Payer: Self-pay

## 2021-12-11 ENCOUNTER — Ambulatory Visit (INDEPENDENT_AMBULATORY_CARE_PROVIDER_SITE_OTHER): Payer: PPO | Admitting: Family Medicine

## 2021-12-11 VITALS — BP 110/68 | HR 65 | Temp 97.6°F | Ht 64.25 in | Wt 181.4 lb

## 2021-12-11 DIAGNOSIS — K219 Gastro-esophageal reflux disease without esophagitis: Secondary | ICD-10-CM

## 2021-12-11 DIAGNOSIS — R300 Dysuria: Secondary | ICD-10-CM | POA: Insufficient documentation

## 2021-12-11 DIAGNOSIS — R8290 Unspecified abnormal findings in urine: Secondary | ICD-10-CM

## 2021-12-11 LAB — POC URINALSYSI DIPSTICK (AUTOMATED)
Bilirubin, UA: NEGATIVE
Blood, UA: 25
Glucose, UA: NEGATIVE
Ketones, UA: NEGATIVE
Leukocytes, UA: NEGATIVE
Nitrite, UA: NEGATIVE
Protein, UA: NEGATIVE
Spec Grav, UA: 1.025 (ref 1.010–1.025)
Urobilinogen, UA: 0.2 E.U./dL
pH, UA: 6 (ref 5.0–8.0)

## 2021-12-11 LAB — POCT UA - MICROSCOPIC ONLY
Bacteria, U Microscopic: 0
RBC, Urine, Miroscopic: 0 (ref 0–2)
WBC, Ur, HPF, POC: 0 (ref 0–5)

## 2021-12-11 MED ORDER — SULFAMETHOXAZOLE-TRIMETHOPRIM 800-160 MG PO TABS
1.0000 | ORAL_TABLET | Freq: Two times a day (BID) | ORAL | 0 refills | Status: DC
Start: 2021-12-11 — End: 2022-01-01

## 2021-12-11 NOTE — Progress Notes (Signed)
Subjective:    Patient ID: [name] [name], female    DOB: 1951-01-04, 71 y.o.   MRN: [id number]  This visit occurred during the SARS-CoV-2 public health emergency.  Safety protocols were in place, including screening questions prior to the visit, additional usage of staff PPE, and extensive cleaning of exam room while observing appropriate contact time as indicated for disinfecting solutions.   HPI Pt presents for urinary symptoms   Wt Readings from Last 3 Encounters:  12/11/21 181 lb 6 oz (82.3 kg)  06/14/21 180 lb 2 oz (81.7 kg)  02/13/21 180 lb 9 oz (81.9 kg)    Last week started with dysuria  On /off  Some frequency and urgenc  Some low back pain (not flank pain) Has had nausea on and off No fever or chills Drinking water but probably not enough  Urine looked a little darker than usual   Ua today Results for orders placed or performed in visit on 12/11/21  POCT Urinalysis Dipstick (Automated)  Result Value Ref Range   Color, UA Dark Yellow    Clarity, UA Clear    Glucose, UA Negative Negative   Bilirubin, UA Negative    Ketones, UA Negative    Spec Grav, UA 1.025 1.010 - 1.025   Blood, UA 25 Ery/uL    pH, UA 6.0 5.0 - 8.0   Protein, UA Negative Negative   Urobilinogen, UA 0.2 0.2 or 1.0 E.U./dL   Nitrite, UA Negative    Leukocytes, UA Negative Negative  POCT UA - Microscopic Only  Result Value Ref Range   WBC, Ur, HPF, POC 0 0 - 5   RBC, Urine, Miroscopic 0 0 - 2   Bacteria, U Microscopic 0 None - Trace   Mucus, UA few    Epithelial cells, urine per micros few    Crystals, Ur, HPF, POC none    Casts, Ur, LPF, POC none    Yeast, UA none     At end of visit notes more indigestion  Bubble feeling in chest more on the left side  She takes nexium - is supposed to take daily but has missed some doses (of note not on med list today) Some burping  No sob  Reviewing record pt had cardiology w/u for atypical cp in 2016  Patient Active Problem List   Diagnosis  Date Noted   Dysuria 12/11/2021   Medicare annual wellness visit, initial 12/19/2017   Cough 12/19/2017   Anxiety 09/24/2017   Sleep disorder 09/12/2017   Urinary frequency 05/02/2017   Palpitations 05/22/2015   OA (osteoarthritis) of knee 11/21/2014   Colon cancer screening 11/29/2013   Routine general medical examination at a health care facility 09/16/2011   DERMATITIS, ATOPIC 11/27/2010   URTICARIA DUE TO COLD OR HEAT 11/27/2010   STRESS REACTION, ACUTE, WITH EMOTIONAL DISTURBANCE 09/14/2010   THROAT PAIN, CHRONIC 09/14/2010   EXTERNAL HEMORRHOIDS WITHOUT MENTION COMP 06/25/2010   ABNORMAL EKG 12/26/2009   OTHER CHRONIC SINUSITIS 10/19/2009   Vitamin D deficiency 04/21/2009   ALLERGIC RHINITIS 11/09/2008   GERD 07/28/2008   Osteopenia 03/14/2008   HYPERCHOLESTEROLEMIA, PURE 03/13/2007   HIATAL HERNIA 01/12/2007   Past Medical History:  Diagnosis Date   Acute meniscal tear of knee    Arthritis    "neck; left knee before replacement" (05/10/2015)   Basal cell cancer 5/08   Breast cyst    "left"   Chest pain 9/11   [facility]) and palpitations - ruled out for MI with  nl. Echo   Complication of anesthesia HARD TO WAKE   Difficulty sleeping    Eczema    GERD (gastroesophageal reflux disease)    Heart palpitations OCCASIONAL--  TAKES ATENOLOL PRN   Hiatal hernia    HSV-2 infection    buttocks   Hyperlipidemia DIET CONTROL   Melanoma of skin (HCC)    MVP (mitral valve prolapse)    per pt no treatment needed   Normal nuclear stress test 07-09-10   Osteopenia 12/2013   T score -1.6 FRAX 16%/0.8%   PONV (postoperative nausea and vomiting)    Past Surgical History:  Procedure Laterality Date   Carotid US  9/05   Mild, recheck 1 year   Carotid US  10/06   Normal   CHONDROPLASTY  10/23/2011   Procedure: CHONDROPLASTY;  Surgeon: Gearlean Alf, MD;  Location: [place];  Service: Orthopedics;  Laterality: Left;  Left medial    COLONOSCOPY      ESOPHAGOGASTRODUODENOSCOPY  9/07   Normal   HYSTEROSCOPY WITH RESECTOSCOPE  2000   POLPECTOMY'S   JOINT REPLACEMENT     KNEE ARTHROSCOPY  10/23/2011   Procedure: ARTHROSCOPY KNEE;  Surgeon: Gearlean Alf, MD;  Location: [facility];  Service: Orthopedics;  Laterality: Left;  LEFT KNEE SCOPE WITH DEBRIDEMENT    MELANOMA EXCISION Right 1995   "side"   MOHS SURGERY Left    "side of my nose"   MOLE REMOVAL     "several; all over my body"   MRI neck  3/00   C4-C5 herniation small stenosis mild C4-5, C5-6   TONSILLECTOMY     TOTAL KNEE ARTHROPLASTY Left 11/21/2014   Procedure: LEFT TOTAL KNEE ARTHROPLASTY;  Surgeon: Gearlean Alf, MD;  Location: [facility];  Service: Orthopedics;  Laterality: Left;   TRANSTHORACIC ECHOCARDIOGRAM  06-29-2010   NORMAL LVSF, EF 55-60%,  MILD MR   TUBAL LIGATION  1989   VAGINAL HYSTERECTOMY  2001   complex hyperplasia with mother's history of uterine cancer   Social History   Tobacco Use   Smoking status: Former    Packs/day: 1.00    Years: 15.00    Pack years: 15.00    Types: Cigarettes    Quit date: 10/07/1978    Years since quitting: 43.2   Smokeless tobacco: Never  Vaping Use   Vaping Use: Never used  Substance Use Topics   Alcohol use: No    Alcohol/week: 0.0 standard drinks   Drug use: No   Family History  Problem Relation Age of Onset   Cancer Mother        uterine   Obesity Mother    Thrombocytopenia Mother    Diabetes Mother    Osteoarthritis Mother    Alcohol abuse Father    Heart disease Father        CAD in 25's   Emphysema Father    Diabetes Maternal Grandmother        type 2   Heart disease Paternal Aunt        CAD   Heart disease Paternal Uncle        CAD   Osteoarthritis Sister    Osteoarthritis Maternal Aunt    Colon cancer Neg Hx    Breast cancer Neg Hx    Allergies  Allergen Reactions   Amoxicillin Rash   Codeine Rash   Macrobid [Nitrofurantoin]     GI issues   Clarithromycin Other (See  Comments)    REACTION:  reaction not known   Trazodone And Nefazodone Palpitations   Current Outpatient Medications on File Prior to Visit  Medication Sig Dispense Refill   acyclovir (ZOVIRAX) 200 MG capsule TAKE 1 CAPSULE (200 MG TOTAL) BY MOUTH DAILY AS NEEDED. 90 capsule 1   acyclovir cream (ZOVIRAX) 5 % Apply 1 application topically daily as needed. 5 g 5   Ascorbic Acid (VITAMIN C PO) Take 1 tablet by mouth daily.     aspirin EC 81 MG tablet Take 1 tablet (81 mg total) by mouth daily.     atenolol (TENORMIN) 25 MG tablet Take 0.5 tablets (12.5 mg total) by mouth daily as needed (palpatations). 30 tablet 5   CALCIUM CARBONATE PO Take 1 tablet by mouth daily.     Cholecalciferol (VITAMIN D PO) Take 1 tablet by mouth daily.     CINNAMON PO Take 1 capsule by mouth. Takes occasionally     CYANOCOBALAMIN PO Take 1 tablet by mouth daily.     diclofenac sodium (VOLTAREN) 1 % GEL APPLY 4 GRAMS TO THE AFFECTED AREA 3 TIMES DAILY AS NEEDED 100 g 3   escitalopram (LEXAPRO) 10 MG tablet Take 0.5 tablets (5 mg total) by mouth daily. 45 tablet 3   KRILL OIL PO Take by mouth. Takes 1 tab occasionally     magnesium gluconate (MAGONATE) 500 MG tablet Take 500 mg by mouth daily.     Multiple Vitamin (MULTIVITAMIN) capsule Take 1 capsule by mouth daily.     Polyethyl Glycol-Propyl Glycol (SYSTANE OP) Apply 1 drop to eye daily as needed (Dry eyes).     Current Facility-Administered Medications on File Prior to Visit  Medication Dose Route Frequency Provider Last Rate Last Admin   gi cocktail (Maalox,Lidocaine,Donnatal)  30 mL Oral Once Briscoe Deutscher, DO         Review of Systems  Constitutional:  Negative for activity change, appetite change, fatigue, fever and unexpected weight change.  HENT:  Negative for congestion, ear pain, rhinorrhea, sinus pressure and sore throat.   Eyes:  Negative for pain, redness and visual disturbance.  Respiratory:  Negative for cough, shortness of breath and wheezing.    Cardiovascular:  Negative for chest pain and palpitations.  Gastrointestinal:  Negative for abdominal pain, blood in stool, constipation and diarrhea.       Indigestion  Feels like bubble in her chest   Endocrine: Negative for polydipsia and polyuria.  Genitourinary:  Positive for dysuria, frequency and urgency.  Musculoskeletal:  Negative for arthralgias, back pain and myalgias.  Skin:  Negative for pallor and rash.  Allergic/Immunologic: Negative for environmental allergies.  Neurological:  Negative for dizziness, syncope and headaches.  Hematological:  Negative for adenopathy. Does not bruise/bleed easily.  Psychiatric/Behavioral:  Negative for decreased concentration and dysphoric mood. The patient is not nervous/anxious.       Objective:   Physical Exam Constitutional:      General: She is not in acute distress.    Appearance: Normal appearance. She is well-developed. She is obese. She is not ill-appearing or diaphoretic.  HENT:     Head: Normocephalic and atraumatic.  Eyes:     General: No scleral icterus.    Conjunctiva/sclera: Conjunctivae normal.     Pupils: Pupils are equal, round, and reactive to light.  Cardiovascular:     Rate and Rhythm: Normal rate and regular rhythm.     Heart sounds: Normal heart sounds.  Pulmonary:     Effort: Pulmonary effort is normal.  Breath sounds: Normal breath sounds.  Abdominal:     General: Bowel sounds are normal. There is no distension.     Palpations: Abdomen is soft. There is no mass.     Tenderness: There is abdominal tenderness in the suprapubic area. There is no guarding or rebound.     Hernia: No hernia is present.     Comments: No cva tenderness  Mild suprapubic tenderness   Musculoskeletal:     Cervical back: Normal range of motion and neck supple.     Right lower leg: No edema.  Lymphadenopathy:     Cervical: No cervical adenopathy.  Skin:    Findings: No rash.  Neurological:     Mental Status: She is alert.   Psychiatric:        Mood and Affect: Mood normal.          Assessment & Plan:   Problem List Items Addressed This Visit       Digestive   GERD    Pt c/o bubble feeling in chest  She states she takes nexium (not on med list) and has missed a dose  Past remote h/o atypical chest pain  Computer system is down while she is in office inst to get back to daily nexium and watch diet and f/u if no improvement for further eval          Other   Dysuria - Primary    Since last week on/off  ua notes tr blood but none seen on micro Encouraged water  Encouraged her to avoid all other beverages tx empirically with bactrim (last uti was sensitive)  Pending urine culture result  inst to update if worse before then        Relevant Orders   POCT Urinalysis Dipstick (Automated) (Completed)   Urine Culture   POCT UA - Microscopic Only (Completed)   Other Visit Diagnoses     Abnormal urinalysis       Relevant Orders   Urine Culture

## 2021-12-11 NOTE — Assessment & Plan Note (Signed)
Since last week on/off  ?ua notes tr blood but none seen on micro ?Encouraged water  ?Encouraged her to avoid all other beverages ?tx empirically with bactrim (last uti was sensitive)  ?Pending urine culture result  ?inst to update if worse before then  ? ?

## 2021-12-11 NOTE — Assessment & Plan Note (Signed)
Pt c/o bubble feeling in chest  ?She states she takes nexium (not on med list) and has missed a dose  ?Past remote h/o atypical chest pain  ?Computer system is down while she is in office ?inst to get back to daily nexium and watch diet and f/u if no improvement for further eval  ? ?

## 2021-12-11 NOTE — Patient Instructions (Signed)
Drink water ?Avoid other beverages for now until urinary pain stops  ?Take bactrim as directed ?We will alert you when the urine culture returns ? ?If symptoms worsen in meantime let us know  ? ? ?If your indigestion /chest symptoms do not improve with regular nexium dosing please follow up to evaluate it further  ?

## 2021-12-12 LAB — URINE CULTURE
MICRO NUMBER:: 13098227
Result:: NO GROWTH
SPECIMEN QUALITY:: ADEQUATE

## 2021-12-14 ENCOUNTER — Ambulatory Visit (INDEPENDENT_AMBULATORY_CARE_PROVIDER_SITE_OTHER)
Admission: RE | Admit: 2021-12-14 | Discharge: 2021-12-14 | Disposition: A | Discharge status: Home / Self Care | Payer: PPO | Source: Ambulatory Visit | Attending: Family Medicine | Admitting: Family Medicine

## 2021-12-14 ENCOUNTER — Other Ambulatory Visit: Payer: Self-pay

## 2021-12-14 ENCOUNTER — Ambulatory Visit (INDEPENDENT_AMBULATORY_CARE_PROVIDER_SITE_OTHER): Payer: PPO | Admitting: Family Medicine

## 2021-12-14 ENCOUNTER — Encounter: Payer: Self-pay | Admitting: Family Medicine

## 2021-12-14 VITALS — BP 102/70 | HR 68 | Temp 97.5°F | Ht 64.25 in | Wt 179.0 lb

## 2021-12-14 DIAGNOSIS — R110 Nausea: Secondary | ICD-10-CM | POA: Diagnosis not present

## 2021-12-14 DIAGNOSIS — R0789 Other chest pain: Secondary | ICD-10-CM

## 2021-12-14 DIAGNOSIS — R1013 Epigastric pain: Secondary | ICD-10-CM | POA: Insufficient documentation

## 2021-12-14 DIAGNOSIS — R300 Dysuria: Secondary | ICD-10-CM

## 2021-12-14 DIAGNOSIS — R5382 Chronic fatigue, unspecified: Secondary | ICD-10-CM | POA: Diagnosis not present

## 2021-12-14 DIAGNOSIS — R5383 Other fatigue: Secondary | ICD-10-CM | POA: Insufficient documentation

## 2021-12-14 DIAGNOSIS — T471X5A Adverse effect of other antacids and anti-gastric-secretion drugs, initial encounter: Secondary | ICD-10-CM | POA: Insufficient documentation

## 2021-12-14 LAB — HEPATIC FUNCTION PANEL
ALT: 16 U/L (ref 0–35)
AST: 21 U/L (ref 0–37)
Albumin: 4.6 g/dL (ref 3.5–5.2)
Alkaline Phosphatase: 78 U/L (ref 39–117)
Bilirubin, Direct: 0.2 mg/dL (ref 0.0–0.3)
Total Bilirubin: 0.9 mg/dL (ref 0.2–1.2)
Total Protein: 7 g/dL (ref 6.0–8.3)

## 2021-12-14 LAB — CBC WITH DIFFERENTIAL/PLATELET
Basophils Absolute: 0 10*3/uL (ref 0.0–0.1)
Basophils Relative: 0.4 % (ref 0.0–3.0)
Eosinophils Absolute: 0.1 10*3/uL (ref 0.0–0.7)
Eosinophils Relative: 1.4 % (ref 0.0–5.0)
HCT: 44.3 % (ref 36.0–46.0)
Hemoglobin: 15.3 g/dL — ABNORMAL HIGH (ref 12.0–15.0)
Lymphocytes Relative: 32.6 % (ref 12.0–46.0)
Lymphs Abs: 1.8 10*3/uL (ref 0.7–4.0)
MCHC: 34.6 g/dL (ref 30.0–36.0)
MCV: 95.9 fl (ref 78.0–100.0)
Monocytes Absolute: 0.3 10*3/uL (ref 0.1–1.0)
Monocytes Relative: 6 % (ref 3.0–12.0)
Neutro Abs: 3.3 10*3/uL (ref 1.4–7.7)
Neutrophils Relative %: 59.6 % (ref 43.0–77.0)
Platelets: 204 10*3/uL (ref 150.0–400.0)
RBC: 4.62 Mil/uL (ref 3.87–5.11)
RDW: 13.4 % (ref 11.5–15.5)
WBC: 5.6 10*3/uL (ref 4.0–10.5)

## 2021-12-14 LAB — BASIC METABOLIC PANEL
BUN: 16 mg/dL (ref 6–23)
CO2: 30 mEq/L (ref 19–32)
Calcium: 10.2 mg/dL (ref 8.4–10.5)
Chloride: 102 mEq/L (ref 96–112)
Creatinine, Ser: 0.92 mg/dL (ref 0.40–1.20)
GFR: 63.44 mL/min (ref >60.00)
Glucose, Bld: 80 mg/dL (ref 70–99)
Potassium: 4.6 mEq/L (ref 3.5–5.1)
Sodium: 139 mEq/L (ref 135–145)

## 2021-12-14 LAB — IRON: Iron: 158 ug/dL — ABNORMAL HIGH (ref 42–145)

## 2021-12-14 LAB — VITAMIN B12: Vitamin B-12: 809 pg/mL (ref 211–911)

## 2021-12-14 LAB — AMYLASE: Amylase: 61 U/L (ref 27–131)

## 2021-12-14 LAB — T4, FREE: Free T4: 1.08 ng/dL (ref 0.60–1.60)

## 2021-12-14 LAB — TSH: TSH: 2.06 u[IU]/mL (ref 0.35–5.50)

## 2021-12-14 MED ORDER — ESOMEPRAZOLE MAGNESIUM 20 MG PO CPDR: 20.0000 mg | DELAYED_RELEASE_CAPSULE | Freq: Every day | ORAL | 0 refills | Status: DC

## 2021-12-14 MED ORDER — FAMOTIDINE 20 MG PO TABS: 20.0000 mg | ORAL_TABLET | Freq: Two times a day (BID) | ORAL | 3 refills | Status: DC

## 2021-12-14 NOTE — Assessment & Plan Note (Signed)
Possibly multifactorial  ?Also some GI symptoms  ? ?Labs ordered ?F/u 2 wk  ?

## 2021-12-14 NOTE — Assessment & Plan Note (Signed)
With epig pain  ?Lab pending  ? ?pepcid 20 mg bid added  ?If helpful will wean the nexium ?

## 2021-12-14 NOTE — Progress Notes (Signed)
Subjective:    Patient ID: [name] [name], female    DOB: 12-Feb-1951, 71 y.o.   MRN: [id number]  This visit occurred during the SARS-CoV-2 public health emergency.  Safety protocols were in place, including screening questions prior to the visit, additional usage of staff PPE, and extensive cleaning of exam room while observing appropriate contact time as indicated for disinfecting solutions.   HPI Pt presents for left sided chest discomfort   Wt Readings from Last 3 Encounters:  12/14/21 179 lb (81.2 kg)  12/11/21 181 lb 6 oz (82.3 kg)  06/14/21 180 lb 2 oz (81.7 kg)   30.49 kg/m  Last visit pt had dysuria  Tr blood on ua  (none seen on micro however)  Cx was negative   Took empiric bactrim  Symptoms are gone/better  Drinking more water   Pain/gassy feeling under L breast  In epigastric area - it does not "feel right" A little pain and pressure  Is nauseated frequently /no vomiting   H/o hiatal hernia with GERD Nexium 20 mg daily  No diarrhea  A little constipation  Eats prunes every night  Still has a gallbladder   No palpitations  If any sob -thinks it may from anxiety /baseline  Exercise tolerance is pretty good  Does get winded if she walks up hill   EKG today  Nsr rate of 58 with a pvc  Does have phlegm in throat-chronic    Had remote cardiac w/u for atypical cp   Myoview  stress 2016  -nl with nl EF Echol 2016 Was having pvcs at the time  (baseline ekg)   Exhausted in the afternoons for 2 weeks    Patient Active Problem List   Diagnosis Date Noted   Nausea 12/14/2021   Epigastric pain 12/14/2021   Adverse effect of proton pump inhibitor 12/14/2021   Fatigue 12/14/2021   Dysuria 12/11/2021   Medicare annual wellness visit, initial 12/19/2017   Cough 12/19/2017   Anxiety 09/24/2017   Sleep disorder 09/12/2017   Urinary frequency 05/02/2017   Palpitations 05/22/2015   Chest pain 05/10/2015   OA (osteoarthritis) of knee 11/21/2014   Colon  cancer screening 11/29/2013   Routine general medical examination at a health care facility 09/16/2011   DERMATITIS, ATOPIC 11/27/2010   URTICARIA DUE TO COLD OR HEAT 11/27/2010   STRESS REACTION, ACUTE, WITH EMOTIONAL DISTURBANCE 09/14/2010   THROAT PAIN, CHRONIC 09/14/2010   EXTERNAL HEMORRHOIDS WITHOUT MENTION COMP 06/25/2010   ABNORMAL EKG 12/26/2009   OTHER CHRONIC SINUSITIS 10/19/2009   Vitamin D deficiency 04/21/2009   ALLERGIC RHINITIS 11/09/2008   GERD 07/28/2008   Osteopenia 03/14/2008   HYPERCHOLESTEROLEMIA, PURE 03/13/2007   HIATAL HERNIA 01/12/2007   Past Medical History:  Diagnosis Date   Acute meniscal tear of knee    Arthritis    "neck; left knee before replacement" (05/10/2015)   Basal cell cancer 5/08   Breast cyst    "left"   Chest pain 9/11   (Hosp) and palpitations - ruled out for MI with nl. Echo   Complication of anesthesia HARD TO WAKE   Difficulty sleeping    Eczema    GERD (gastroesophageal reflux disease)    Heart palpitations OCCASIONAL--  TAKES ATENOLOL PRN   Hiatal hernia    HSV-2 infection    buttocks   Hyperlipidemia DIET CONTROL   Melanoma of skin (HCC)    MVP (mitral valve prolapse)    per pt no treatment needed   Normal nuclear  stress test 07-09-10   Osteopenia 12/2013   T score -1.6 FRAX 16%/0.8%   PONV (postoperative nausea and vomiting)    Past Surgical History:  Procedure Laterality Date   Carotid US  9/05   Mild, recheck 1 year   Carotid US  10/06   Normal   CHONDROPLASTY  10/23/2011   Procedure: CHONDROPLASTY;  Surgeon: Gearlean Alf, MD;  Location: [facility];  Service: Orthopedics;  Laterality: Left;  Left medial    COLONOSCOPY     ESOPHAGOGASTRODUODENOSCOPY  9/07   Normal   HYSTEROSCOPY WITH RESECTOSCOPE  2000   POLPECTOMY'S   JOINT REPLACEMENT     KNEE ARTHROSCOPY  10/23/2011   Procedure: ARTHROSCOPY KNEE;  Surgeon: Gearlean Alf, MD;  Location: [facility];  Service: Orthopedics;   Laterality: Left;  LEFT KNEE SCOPE WITH DEBRIDEMENT    MELANOMA EXCISION Right 1995   "side"   MOHS SURGERY Left    "side of my nose"   MOLE REMOVAL     "several; all over my body"   MRI neck  3/00   C4-C5 herniation small stenosis mild C4-5, C5-6   TONSILLECTOMY     TOTAL KNEE ARTHROPLASTY Left 11/21/2014   Procedure: LEFT TOTAL KNEE ARTHROPLASTY;  Surgeon: Gearlean Alf, MD;  Location: [facility];  Service: Orthopedics;  Laterality: Left;   TRANSTHORACIC ECHOCARDIOGRAM  06-29-2010   NORMAL LVSF, EF 55-60%,  MILD MR   TUBAL LIGATION  1989   VAGINAL HYSTERECTOMY  2001   complex hyperplasia with mother's history of uterine cancer   Social History   Tobacco Use   Smoking status: Former    Packs/day: 1.00    Years: 15.00    Pack years: 15.00    Types: Cigarettes    Quit date: 10/07/1978    Years since quitting: 43.2   Smokeless tobacco: Never  Vaping Use   Vaping Use: Never used  Substance Use Topics   Alcohol use: No    Alcohol/week: 0.0 standard drinks   Drug use: No   Family History  Problem Relation Age of Onset   Cancer Mother        uterine   Obesity Mother    Thrombocytopenia Mother    Diabetes Mother    Osteoarthritis Mother    Alcohol abuse Father    Heart disease Father        CAD in 14's   Emphysema Father    Diabetes Maternal Grandmother        type 2   Heart disease Paternal Aunt        CAD   Heart disease Paternal Uncle        CAD   Osteoarthritis Sister    Osteoarthritis Maternal Aunt    Colon cancer Neg Hx    Breast cancer Neg Hx    Allergies  Allergen Reactions   Amoxicillin Rash   Codeine Rash   Macrobid [Nitrofurantoin]     GI issues   Clarithromycin Other (See Comments)    REACTION: reaction not known   Trazodone And Nefazodone Palpitations   Current Outpatient Medications on File Prior to Visit  Medication Sig Dispense Refill   acyclovir (ZOVIRAX) 200 MG capsule TAKE 1 CAPSULE (200 MG TOTAL) BY MOUTH DAILY AS NEEDED. 90 capsule 1    acyclovir cream (ZOVIRAX) 5 % Apply 1 application topically daily as needed. 5 g 5   Ascorbic Acid (VITAMIN C PO) Take 1 tablet by mouth daily.  aspirin EC 81 MG tablet Take 1 tablet (81 mg total) by mouth daily.     atenolol (TENORMIN) 25 MG tablet Take 0.5 tablets (12.5 mg total) by mouth daily as needed (palpatations). 30 tablet 5   CALCIUM CARBONATE PO Take 1 tablet by mouth daily.     Cholecalciferol (VITAMIN D PO) Take 1 tablet by mouth daily.     CINNAMON PO Take 1 capsule by mouth. Takes occasionally     CYANOCOBALAMIN PO Take 1 tablet by mouth daily.     diclofenac sodium (VOLTAREN) 1 % GEL APPLY 4 GRAMS TO THE AFFECTED AREA 3 TIMES DAILY AS NEEDED 100 g 3   escitalopram (LEXAPRO) 10 MG tablet Take 0.5 tablets (5 mg total) by mouth daily. 45 tablet 3   magnesium gluconate (MAGONATE) 500 MG tablet Take 500 mg by mouth daily.     Multiple Vitamin (MULTIVITAMIN) capsule Take 1 capsule by mouth daily.     Omega-3 Fatty Acids (ULTRA OMEGA-3 FISH OIL PO) Take by mouth.     Polyethyl Glycol-Propyl Glycol (SYSTANE OP) Apply 1 drop to eye daily as needed (Dry eyes).     sulfamethoxazole-trimethoprim (BACTRIM DS) 800-160 MG tablet Take 1 tablet by mouth 2 (two) times daily. 10 tablet 0   KRILL OIL PO Take by mouth. Takes 1 tab occasionally (Patient not taking: Reported on 12/14/2021)     No current facility-administered medications on file prior to visit.    Review of Systems  Constitutional:  Positive for fatigue. Negative for activity change, appetite change, fever and unexpected weight change.  HENT:  Negative for congestion, ear pain, rhinorrhea, sinus pressure and sore throat.   Eyes:  Negative for pain, redness and visual disturbance.  Respiratory:  Negative for cough, shortness of breath and wheezing.   Cardiovascular:  Negative for chest pain and palpitations.  Gastrointestinal:  Positive for abdominal pain and nausea. Negative for abdominal distention, anal bleeding, blood in  stool, constipation, diarrhea, rectal pain and vomiting.  Endocrine: Negative for polydipsia and polyuria.  Genitourinary:  Negative for dysuria, frequency and urgency.  Musculoskeletal:  Negative for arthralgias, back pain and myalgias.  Skin:  Negative for pallor and rash.  Allergic/Immunologic: Negative for environmental allergies.  Neurological:  Negative for dizziness, syncope and headaches.  Hematological:  Negative for adenopathy. Does not bruise/bleed easily.  Psychiatric/Behavioral:  Negative for decreased concentration and dysphoric mood. The patient is not nervous/anxious.       Objective:   Physical Exam Constitutional:      General: She is not in acute distress.    Appearance: Normal appearance. She is well-developed. She is not ill-appearing or diaphoretic.  HENT:     Head: Normocephalic and atraumatic.     Mouth/Throat:     Mouth: Mucous membranes are moist.  Eyes:     Conjunctiva/sclera: Conjunctivae normal.     Pupils: Pupils are equal, round, and reactive to light.  Neck:     Thyroid: No thyromegaly.     Vascular: No carotid bruit or JVD.  Cardiovascular:     Rate and Rhythm: Normal rate and regular rhythm.     Pulses: Normal pulses.     Heart sounds: Normal heart sounds.    No gallop.  Pulmonary:     Effort: Pulmonary effort is normal. No respiratory distress.     Breath sounds: Normal breath sounds. No wheezing or rales.  Abdominal:     General: Abdomen is flat. Bowel sounds are normal. There is no distension or  abdominal bruit.     Palpations: Abdomen is soft. There is no hepatomegaly, splenomegaly, mass or pulsatile mass.     Tenderness: There is abdominal tenderness in the right upper quadrant, epigastric area and left upper quadrant. There is no right CVA tenderness or left CVA tenderness.     Hernia: No hernia is present.  Musculoskeletal:     Cervical back: Normal range of motion and neck supple.     Right lower leg: No edema.     Left lower leg: No  edema.     Comments: Mod to severe thoracolumbar scoliosis   Lymphadenopathy:     Cervical: No cervical adenopathy.  Skin:    General: Skin is warm and dry.     Coloration: Skin is not jaundiced or pale.     Findings: No bruising or rash.  Neurological:     Mental Status: She is alert.     Coordination: Coordination normal.     Deep Tendon Reflexes: Reflexes are normal and symmetric. Reflexes normal.  Psychiatric:        Mood and Affect: Mood normal.          Assessment & Plan:   Problem List Items Addressed This Visit       Other   Adverse effect of proton pump inhibitor    B12 added to labs  Takes nexium 20 mg daily  Some fatigue      Relevant Orders   Vitamin B12   Iron   Chest pain    Left sided under breast  Atypical  Also some GI symptoms   No acute changes on EKG cxr ordered  Will see if this resolves with tx of GI issue  Scoliosis could certainly be adding also in setting of HH      Relevant Orders   DG Chest 2 View   EKG 12-Lead (Completed)   Dysuria    Resolved on bactrim  Drinking water cx is negative  inst to stop abx and alert [place] if symptoms return        Epigastric pain - Primary    Gastritis is in the differential   inst to continue nexium 20 for now Add pepcid 20 mg bid  Watch diet for triggers  Labs ordered  Consider abd [place] if no improvement        Relevant Orders   CBC with Differential/Platelet   Basic metabolic panel   Hepatic function panel   Iron   Amylase   Fatigue    Possibly multifactorial  Also some GI symptoms   Labs ordered F/u 2 wk       Relevant Orders   TSH   T4, free   Vitamin B12   Iron   Nausea    With epig pain  Lab pending   pepcid 20 mg bid added  If helpful will wean the nexium      Relevant Orders   CBC with Differential/Platelet   Basic metabolic panel   Hepatic function panel   Iron   Amylase

## 2021-12-14 NOTE — Assessment & Plan Note (Addendum)
Left sided under breast  ?Atypical  ?Also some GI symptoms  ? ?No acute changes on EKG ?cxr ordered  ?Will see if this resolves with tx of GI issue  ?Scoliosis could certainly be adding also in setting of HH ?

## 2021-12-14 NOTE — Patient Instructions (Addendum)
Stop the bactrim  ?Drink lots of water  ?Let us know if the urine symptoms come back or persist  ? ?Start pepcid ?Conitnue the nexium for now  ?Watch diet  ? ?Drink water  ? ?Chest film and ekg and labs today  ? ? ?Follow up in 2 weeks  ? ? ?If symptoms worsen please let me know  ? ? ?

## 2021-12-14 NOTE — Assessment & Plan Note (Signed)
Gastritis is in the differential  ? ?inst to continue nexium 20 for now ?Add pepcid 20 mg bid  ?Watch diet for triggers  ?Labs ordered  ?Consider abd [place] if no improvement  ? ?

## 2021-12-14 NOTE — Assessment & Plan Note (Signed)
Resolved on bactrim  ?Drinking water ?cx is negative  ?inst to stop abx and alert [place] if symptoms return  ? ?

## 2021-12-14 NOTE — Assessment & Plan Note (Signed)
B12 added to labs  ?Takes nexium 20 mg daily  ?Some fatigue ?

## 2021-12-18 ENCOUNTER — Telehealth: Payer: Self-pay | Admitting: Family Medicine

## 2021-12-18 NOTE — Telephone Encounter (Signed)
Pt  called asking for a call back to discuss lab results. Please advise. ?

## 2021-12-19 NOTE — Telephone Encounter (Signed)
Looks like labs were discussed with patient yesterday. LMTCB to see if anything further was needed.  ?

## 2021-12-19 NOTE — Telephone Encounter (Signed)
Patient is requesting a call back regarding her iron level, states she wasn't told the exact number, and she is unable to get into my chart ? ?Patient also has concerns about regarding if there is anything that she needs to personally do. ?

## 2021-12-20 NOTE — Telephone Encounter (Signed)
Patient advised. See lab result note ?

## 2022-01-01 ENCOUNTER — Ambulatory Visit (INDEPENDENT_AMBULATORY_CARE_PROVIDER_SITE_OTHER): Payer: PPO | Admitting: Family Medicine

## 2022-01-01 ENCOUNTER — Other Ambulatory Visit: Payer: Self-pay

## 2022-01-01 ENCOUNTER — Encounter: Payer: Self-pay | Admitting: Family Medicine

## 2022-01-01 VITALS — BP 128/72 | HR 68 | Temp 97.9°F | Ht 64.25 in | Wt 182.5 lb

## 2022-01-01 DIAGNOSIS — K219 Gastro-esophageal reflux disease without esophagitis: Secondary | ICD-10-CM | POA: Diagnosis not present

## 2022-01-01 DIAGNOSIS — R790 Abnormal level of blood mineral: Secondary | ICD-10-CM | POA: Diagnosis not present

## 2022-01-01 DIAGNOSIS — R1013 Epigastric pain: Secondary | ICD-10-CM

## 2022-01-01 DIAGNOSIS — R110 Nausea: Secondary | ICD-10-CM | POA: Diagnosis not present

## 2022-01-01 NOTE — Assessment & Plan Note (Signed)
This and L upper abd pain have improved significantly with less nexium and addn of pepcid 20 mg bid  ?Disc diet /avoid triggering foods ?Less indigestion  ?occ nagging cough/throat clearing  ? ?Will continue as above with pepcid 20 mg bid and consider GI ref if no further improvement in a month  ?

## 2022-01-01 NOTE — Patient Instructions (Addendum)
Continue the pepcid twice daily  ?Use nexium only as needed  ? ?If no further improvement we would refer to GI but let us know (in a month)  ? ?Watch the cough and throat clearing ?If you notice this worsening or not improving let us know  ? ? ?Let's check iron with iron stores today also  ?We will update you with results  ? ?Avoid any supplements with iron in them  ? ?Avoid foods that cause acid reflux  ? ? ? ? ? ? ?

## 2022-01-01 NOTE — Progress Notes (Signed)
? ?Subjective:  ? ? Patient ID: [name] [name], female    DOB: 03/06/1951, 71 y.o.   MRN: [id number] ? ?This visit occurred during the SARS-CoV-2 public health emergency.  Safety protocols were in place, including screening questions prior to the visit, additional usage of staff PPE, and extensive cleaning of exam room while observing appropriate contact time as indicated for disinfecting solutions.  ? ?HPI ?Pt presents for f/u of epigastric pain  and fatigue  ? ?Wt Readings from Last 3 Encounters:  ?01/01/22 182 lb 8 oz (82.8 kg)  ?12/14/21 179 lb (81.2 kg)  ?12/11/21 181 lb 6 oz (82.3 kg)  ? ?31.08 kg/m? ? ? ?Last visit noted gassiness/ discomfort under L breast and in epigastric area  ?Gastritis in differential and addec pepcid 20 mg bid to the nexium she takes  ?Did labs ? ?Results for orders placed or performed in visit on 12/14/21  ?CBC with Differential/Platelet  ?Result Value Ref Range  ? WBC 5.6 4.0 - 10.5 K/uL  ? RBC 4.62 3.87 - 5.11 Mil/uL  ? Hemoglobin 15.3 (H) 12.0 - 15.0 g/dL  ? HCT 44.3 36.0 - 46.0 %  ? MCV 95.9 78.0 - 100.0 fl  ? MCHC 34.6 30.0 - 36.0 g/dL  ? RDW 13.4 11.5 - 15.5 %  ? Platelets 204.0 150.0 - 400.0 K/uL  ? Neutrophils Relative % 59.6 43.0 - 77.0 %  ? Lymphocytes Relative 32.6 12.0 - 46.0 %  ? Monocytes Relative 6.0 3.0 - 12.0 %  ? Eosinophils Relative 1.4 0.0 - 5.0 %  ? Basophils Relative 0.4 0.0 - 3.0 %  ? Neutro Abs 3.3 1.4 - 7.7 K/uL  ? Lymphs Abs 1.8 0.7 - 4.0 K/uL  ? Monocytes Absolute 0.3 0.1 - 1.0 K/uL  ? Eosinophils Absolute 0.1 0.0 - 0.7 K/uL  ? Basophils Absolute 0.0 0.0 - 0.1 K/uL  ?Basic metabolic panel  ?Result Value Ref Range  ? Sodium 139 135 - 145 mEq/L  ? Potassium 4.6 3.5 - 5.1 mEq/L  ? Chloride 102 96 - 112 mEq/L  ? CO2 30 19 - 32 mEq/L  ? Glucose, Bld 80 70 - 99 mg/dL  ? BUN 16 6 - 23 mg/dL  ? Creatinine, Ser 0.92 0.40 - 1.20 mg/dL  ? GFR 63.44 >60.00 mL/min  ? Calcium 10.2 8.4 - 10.5 mg/dL  ?Hepatic function panel  ?Result Value Ref Range  ? Total Bilirubin 0.9  0.2 - 1.2 mg/dL  ? Bilirubin, Direct 0.2 0.0 - 0.3 mg/dL  ? Alkaline Phosphatase 78 39 - 117 U/L  ? AST 21 0 - 37 U/L  ? ALT 16 0 - 35 U/L  ? Total Protein 7.0 6.0 - 8.3 g/dL  ? Albumin 4.6 3.5 - 5.2 g/dL  ?TSH  ?Result Value Ref Range  ? TSH 2.06 0.35 - 5.50 uIU/mL  ?T4, free  ?Result Value Ref Range  ? Free T4 1.08 0.60 - 1.60 ng/dL  ?Vitamin B12  ?Result Value Ref Range  ? Vitamin B-12 809 211 - 911 pg/mL  ?Iron  ?Result Value Ref Range  ? Iron 158 (H) 42 - 145 ug/dL  ?Amylase  ?Result Value Ref Range  ? Amylase 61 27 - 131 U/L  ?  ?Her iron was mildly high  ?She was worried about this ?Hb of 15.3  ?Eating cheerios and was taking a beet supplement  ? ?She was worried about that  ? ?A little less tired lately  ? ?Nexium - taking 2 d per  week ? Taking pepcid bid  ? ?Less indigestion than she had  ?She clears throat and coughs some / pepcid has improved  ? ?Some pain under L breast is less /improved (only had one time)  ?Still having some epigastric pain but not as often  ? ?Patient Active Problem List  ? Diagnosis Date Noted  ? Abnormal serum iron level 01/01/2022  ? Nausea 12/14/2021  ? Epigastric pain 12/14/2021  ? Adverse effect of proton pump inhibitor 12/14/2021  ? Fatigue 12/14/2021  ? Dysuria 12/11/2021  ? Medicare annual wellness visit, initial 12/19/2017  ? Cough 12/19/2017  ? Anxiety 09/24/2017  ? Sleep disorder 09/12/2017  ? Urinary frequency 05/02/2017  ? Palpitations 05/22/2015  ? OA (osteoarthritis) of knee 11/21/2014  ? Colon cancer screening 11/29/2013  ? Routine general medical examination at a health care facility 09/16/2011  ? DERMATITIS, ATOPIC 11/27/2010  ? URTICARIA DUE TO COLD OR HEAT 11/27/2010  ? STRESS REACTION, ACUTE, WITH EMOTIONAL DISTURBANCE 09/14/2010  ? EXTERNAL HEMORRHOIDS WITHOUT MENTION COMP 06/25/2010  ? ABNORMAL EKG 12/26/2009  ? OTHER CHRONIC SINUSITIS 10/19/2009  ? Vitamin D deficiency 04/21/2009  ? ALLERGIC RHINITIS 11/09/2008  ? GERD 07/28/2008  ? Osteopenia 03/14/2008  ?  HYPERCHOLESTEROLEMIA, PURE 03/13/2007  ? HIATAL HERNIA 01/12/2007  ? ?Past Medical History:  ?Diagnosis Date  ? Acute meniscal tear of knee   ? Arthritis   ? "neck; left knee before replacement" (05/10/2015)  ? Basal cell cancer 5/08  ? Breast cyst   ? "left"  ? Chest pain 9/11  ? [facility]) and palpitations - ruled out for MI with nl. Echo  ? Complication of anesthesia HARD TO WAKE  ? Difficulty sleeping   ? Eczema   ? GERD (gastroesophageal reflux disease)   ? Heart palpitations OCCASIONAL--  TAKES ATENOLOL PRN  ? Hiatal hernia   ? HSV-2 infection   ? buttocks  ? Hyperlipidemia DIET CONTROL  ? Melanoma of skin ([place])   ? MVP (mitral valve prolapse)   ? per pt no treatment needed  ? Normal nuclear stress test 07-09-10  ? Osteopenia 12/2013  ? T score -1.6 FRAX 16%/0.8%  ? PONV (postoperative nausea and vomiting)   ? ?Past Surgical History:  ?Procedure Laterality Date  ? Carotid US  9/05  ? Mild, recheck 1 year  ? Carotid US  10/06  ? Normal  ? CHONDROPLASTY  10/23/2011  ? Procedure: CHONDROPLASTY;  Surgeon: Gearlean Alf, MD;  Location: [facility];  Service: Orthopedics;  Laterality: Left;  Left medial ?  ? COLONOSCOPY    ? ESOPHAGOGASTRODUODENOSCOPY  9/07  ? Normal  ? HYSTEROSCOPY WITH RESECTOSCOPE  2000  ? POLPECTOMY'S  ? JOINT REPLACEMENT    ? KNEE ARTHROSCOPY  10/23/2011  ? Procedure: ARTHROSCOPY KNEE;  Surgeon: Gearlean Alf, MD;  Location: [facility];  Service: Orthopedics;  Laterality: Left;  LEFT KNEE SCOPE WITH DEBRIDEMENT   ? MELANOMA EXCISION Right 1995  ? "side"  ? MOHS SURGERY Left   ? "side of my nose"  ? MOLE REMOVAL    ? "several; all over my body"  ? MRI neck  3/00  ? C4-C5 herniation small stenosis mild C4-5, C5-6  ? TONSILLECTOMY    ? TOTAL KNEE ARTHROPLASTY Left 11/21/2014  ? Procedure: LEFT TOTAL KNEE ARTHROPLASTY;  Surgeon: Gearlean Alf, MD;  Location: [facility];  Service: Orthopedics;  Laterality: Left;  ? TRANSTHORACIC ECHOCARDIOGRAM  06-29-2010  ? NORMAL LVSF, EF  55-60%,  MILD MR  ? TUBAL LIGATION  1989  ? VAGINAL HYSTERECTOMY  2001  ? complex hyperplasia with mother's history of uterine cancer  ? ?Social History  ? ?Tobacco Use  ? Smoking status: Former  ?  Packs/day: 1.00  ?  Years: 15.00  ?  Pack years: 15.00  ?  Types: Cigarettes  ?  Quit date: 10/07/1978  ?  Years since quitting: 43.2  ? Smokeless tobacco: Never  ?Vaping Use  ? Vaping Use: Never used  ?Substance Use Topics  ? Alcohol use: No  ?  Alcohol/week: 0.0 standard drinks  ? Drug use: No  ? ?Family History  ?Problem Relation Age of Onset  ? Cancer Mother   ?     uterine  ? Obesity Mother   ? Thrombocytopenia Mother   ? Diabetes Mother   ? Osteoarthritis Mother   ? Alcohol abuse Father   ? Heart disease Father   ?     CAD in 40's  ? Emphysema Father   ? Diabetes Maternal Grandmother   ?     type 2  ? Heart disease Paternal Aunt   ?     CAD  ? Heart disease Paternal Uncle   ?     CAD  ? Osteoarthritis Sister   ? Osteoarthritis Maternal Aunt   ? Colon cancer Neg Hx   ? Breast cancer Neg Hx   ? ?Allergies  ?Allergen Reactions  ? Amoxicillin Rash  ? Codeine Rash  ? Macrobid [Nitrofurantoin]   ?  GI issues  ? Clarithromycin Other (See Comments)  ?  REACTION: reaction not known  ? Trazodone And Nefazodone Palpitations  ? ?Current Outpatient Medications on File Prior to Visit  ?Medication Sig Dispense Refill  ? acyclovir (ZOVIRAX) 200 MG capsule TAKE 1 CAPSULE (200 MG TOTAL) BY MOUTH DAILY AS NEEDED. 90 capsule 1  ? acyclovir cream (ZOVIRAX) 5 % Apply 1 application topically daily as needed. 5 g 5  ? Ascorbic Acid (VITAMIN C PO) Take 1 tablet by mouth daily.    ? aspirin EC 81 MG tablet Take 1 tablet (81 mg total) by mouth daily.    ? atenolol (TENORMIN) 25 MG tablet Take 0.5 tablets (12.5 mg total) by mouth daily as needed (palpatations). 30 tablet 5  ? CALCIUM CARBONATE PO Take 1 tablet by mouth daily.    ? Cholecalciferol (VITAMIN D PO) Take 1 tablet by mouth daily.    ? CINNAMON PO Take 1 capsule by mouth. Takes  occasionally    ? CYANOCOBALAMIN PO Take 1 tablet by mouth daily.    ? diclofenac sodium (VOLTAREN) 1 % GEL APPLY 4 GRAMS TO THE AFFECTED AREA 3 TIMES DAILY AS NEEDED 100 g 3  ? escitalopram (LEXAPRO) 10 MG tabl

## 2022-01-01 NOTE — Assessment & Plan Note (Signed)
Mildly elevated at 158 last check  ?Will re check with ferritin/transferritin today  ? ?No clinical changes  ?Hb 15.3  ? ? ?

## 2022-01-01 NOTE — Assessment & Plan Note (Signed)
Improved with addn of pepcid 20 mg bid ?Enc to watch diet  ?

## 2022-01-02 ENCOUNTER — Telehealth: Payer: Self-pay | Admitting: Family Medicine

## 2022-01-02 DIAGNOSIS — Z85828 Personal history of other malignant neoplasm of skin: Secondary | ICD-10-CM | POA: Diagnosis not present

## 2022-01-02 DIAGNOSIS — D0471 Carcinoma in situ of skin of right lower limb, including hip: Secondary | ICD-10-CM | POA: Diagnosis not present

## 2022-01-02 LAB — CBC WITH DIFFERENTIAL/PLATELET
Basophils Absolute: 0 10*3/uL (ref 0.0–0.1)
Basophils Relative: 0.6 % (ref 0.0–3.0)
Eosinophils Absolute: 0.1 10*3/uL (ref 0.0–0.7)
Eosinophils Relative: 2.3 % (ref 0.0–5.0)
HCT: 42.7 % (ref 36.0–46.0)
Hemoglobin: 14.9 g/dL (ref 12.0–15.0)
Lymphocytes Relative: 35.4 % (ref 12.0–46.0)
Lymphs Abs: 1.9 10*3/uL (ref 0.7–4.0)
MCHC: 35 g/dL (ref 30.0–36.0)
MCV: 95.9 fl (ref 78.0–100.0)
Monocytes Absolute: 0.4 10*3/uL (ref 0.1–1.0)
Monocytes Relative: 6.5 % (ref 3.0–12.0)
Neutro Abs: 3 10*3/uL (ref 1.4–7.7)
Neutrophils Relative %: 55.2 % (ref 43.0–77.0)
Platelets: 219 10*3/uL (ref 150.0–400.0)
RBC: 4.46 Mil/uL (ref 3.87–5.11)
RDW: 13.6 % (ref 11.5–15.5)
WBC: 5.4 10*3/uL (ref 4.0–10.5)

## 2022-01-02 LAB — IBC + FERRITIN
Ferritin: 42 ng/mL (ref 10.0–291.0)
Iron: 123 ug/dL (ref 42–145)
Saturation Ratios: 35.9 % (ref 20.0–50.0)
TIBC: 343 ug/dL (ref 250.0–450.0)
Transferrin: 245 mg/dL (ref 212.0–360.0)

## 2022-01-02 LAB — FERRITIN: Ferritin: 42 ng/mL (ref 10.0–291.0)

## 2022-01-02 LAB — IRON: Iron: 123 ug/dL (ref 42–145)

## 2022-01-02 NOTE — Telephone Encounter (Signed)
Pt called asking for a call back to discuss her lab results. Pt states that she cant get into my chart. Please advise. ?

## 2022-01-03 NOTE — Telephone Encounter (Signed)
Pt notified of lab results and Dr. Marliss Coots comments. Mychart phone # also given to pt so she can reset her mychart  ?

## 2022-01-10 ENCOUNTER — Other Ambulatory Visit: Payer: Self-pay | Admitting: Family Medicine

## 2022-01-10 DIAGNOSIS — Z1231 Encounter for screening mammogram for malignant neoplasm of breast: Secondary | ICD-10-CM

## 2022-01-23 DIAGNOSIS — D225 Melanocytic nevi of trunk: Secondary | ICD-10-CM | POA: Diagnosis not present

## 2022-01-23 DIAGNOSIS — D2271 Melanocytic nevi of right lower limb, including hip: Secondary | ICD-10-CM | POA: Diagnosis not present

## 2022-01-23 DIAGNOSIS — L820 Inflamed seborrheic keratosis: Secondary | ICD-10-CM | POA: Diagnosis not present

## 2022-01-23 DIAGNOSIS — L905 Scar conditions and fibrosis of skin: Secondary | ICD-10-CM | POA: Diagnosis not present

## 2022-01-23 DIAGNOSIS — D1801 Hemangioma of skin and subcutaneous tissue: Secondary | ICD-10-CM | POA: Diagnosis not present

## 2022-01-23 DIAGNOSIS — L570 Actinic keratosis: Secondary | ICD-10-CM | POA: Diagnosis not present

## 2022-01-23 DIAGNOSIS — L814 Other melanin hyperpigmentation: Secondary | ICD-10-CM | POA: Diagnosis not present

## 2022-01-23 DIAGNOSIS — Z85828 Personal history of other malignant neoplasm of skin: Secondary | ICD-10-CM | POA: Diagnosis not present

## 2022-01-23 DIAGNOSIS — L821 Other seborrheic keratosis: Secondary | ICD-10-CM | POA: Diagnosis not present

## 2022-01-23 DIAGNOSIS — D2261 Melanocytic nevi of right upper limb, including shoulder: Secondary | ICD-10-CM | POA: Diagnosis not present

## 2022-01-23 DIAGNOSIS — M720 Palmar fascial fibromatosis [Dupuytren]: Secondary | ICD-10-CM | POA: Diagnosis not present

## 2022-01-25 ENCOUNTER — Ambulatory Visit: Payer: PPO

## 2022-02-05 ENCOUNTER — Ambulatory Visit
Admission: RE | Admit: 2022-02-05 | Discharge: 2022-02-05 | Disposition: A | Discharge status: Home / Self Care | Payer: PPO | Source: Ambulatory Visit | Attending: Family Medicine | Admitting: Family Medicine

## 2022-02-05 DIAGNOSIS — Z1231 Encounter for screening mammogram for malignant neoplasm of breast: Secondary | ICD-10-CM | POA: Diagnosis not present

## 2022-02-25 ENCOUNTER — Other Ambulatory Visit: Payer: PPO

## 2022-02-28 ENCOUNTER — Encounter: Payer: PPO | Admitting: Family Medicine

## 2022-02-28 ENCOUNTER — Telehealth: Payer: Self-pay | Admitting: Family Medicine

## 2022-02-28 NOTE — Telephone Encounter (Signed)
Left message for patient to call back and schedule Medicare Annual Wellness Visit (AWV) either virtually or phone   Last AWV 01/24/21 please schedule at anytime with health coach    I left my direct # [phone]

## 2022-03-11 ENCOUNTER — Ambulatory Visit (INDEPENDENT_AMBULATORY_CARE_PROVIDER_SITE_OTHER): Payer: PPO

## 2022-03-11 ENCOUNTER — Other Ambulatory Visit: Payer: Self-pay | Admitting: Family Medicine

## 2022-03-11 ENCOUNTER — Telehealth: Payer: Self-pay | Admitting: Family Medicine

## 2022-03-11 VITALS — Ht 65.0 in | Wt 175.0 lb

## 2022-03-11 DIAGNOSIS — R5382 Chronic fatigue, unspecified: Secondary | ICD-10-CM

## 2022-03-11 DIAGNOSIS — K219 Gastro-esophageal reflux disease without esophagitis: Secondary | ICD-10-CM

## 2022-03-11 DIAGNOSIS — R790 Abnormal level of blood mineral: Secondary | ICD-10-CM

## 2022-03-11 DIAGNOSIS — Z0000 Encounter for general adult medical examination without abnormal findings: Secondary | ICD-10-CM

## 2022-03-11 DIAGNOSIS — E7800 Pure hypercholesterolemia, unspecified: Secondary | ICD-10-CM

## 2022-03-11 DIAGNOSIS — E559 Vitamin D deficiency, unspecified: Secondary | ICD-10-CM

## 2022-03-11 NOTE — Telephone Encounter (Signed)
-----   Message from Ellamae Sia sent at 02/27/2022  4:22 PM EDT ----- Regarding: lab orders for Tuesday, 6.6.23 Patient is scheduled for CPX labs, please order future labs, Thanks , Karna Christmas

## 2022-03-11 NOTE — Patient Instructions (Signed)
Ms. [name] [name] , Thank you for taking time to come for your Medicare Wellness Visit. I appreciate your ongoing commitment to your health goals. Please review the following plan we discussed and let me know if I can assist you in the future.   Screening recommendations/referrals: Colonoscopy: completed 05/29/2015, due 05/28/2025 Mammogram: completed 02/05/2022, due 02/07/2023 Bone Density: completed 11/30/2019 Recommended yearly ophthalmology/optometry visit for glaucoma screening and checkup Recommended yearly dental visit for hygiene and checkup  Vaccinations: Influenza vaccine: due 05/07/2022 Pneumococcal vaccine: completed 12/24/2018 Tdap vaccine: completed 12/18/2016, due 12/19/2026 Shingles vaccine: discussed   Covid-19: decline  Advanced directives: Advance directive discussed with you today.   Conditions/risks identified: none  Next appointment: Follow up in one year for your annual wellness visit    Preventive Care 65 Years and Older, Female Preventive care refers to lifestyle choices and visits with your health care provider that can promote health and wellness. What does preventive care include? A yearly physical exam. This is also called an annual well check. Dental exams once or twice a year. Routine eye exams. Ask your health care provider how often you should have your eyes checked. Personal lifestyle choices, including: Daily care of your teeth and gums. Regular physical activity. Eating a healthy diet. Avoiding tobacco and drug use. Limiting alcohol use. Practicing safe sex. Taking low-dose aspirin every day. Taking vitamin and mineral supplements as recommended by your health care provider. What happens during an annual well check? The services and screenings done by your health care provider during your annual well check will depend on your age, overall health, lifestyle risk factors, and family history of disease. Counseling  Your health care provider may ask you  questions about your: Alcohol use. Tobacco use. Drug use. Emotional well-being. Home and relationship well-being. Sexual activity. Eating habits. History of falls. Memory and ability to understand (cognition). Work and work Statistician. Reproductive health. Screening  You may have the following tests or measurements: Height, weight, and BMI. Blood pressure. Lipid and cholesterol levels. These may be checked every 5 years, or more frequently if you are over 42 years old. Skin check. Lung cancer screening. You may have this screening every year starting at age 2 if you have a 30-pack-year history of smoking and currently smoke or have quit within the past 15 years. Fecal occult blood test (FOBT) of the stool. You may have this test every year starting at age 48. Flexible sigmoidoscopy or colonoscopy. You may have a sigmoidoscopy every 5 years or a colonoscopy every 10 years starting at age 36. Hepatitis C blood test. Hepatitis B blood test. Sexually transmitted disease (STD) testing. Diabetes screening. This is done by checking your blood sugar (glucose) after you have not eaten for a while (fasting). You may have this done every 1-3 years. Bone density scan. This is done to screen for osteoporosis. You may have this done starting at age 28. Mammogram. This may be done every 1-2 years. Talk to your health care provider about how often you should have regular mammograms. Talk with your health care provider about your test results, treatment options, and if necessary, the need for more tests. Vaccines  Your health care provider may recommend certain vaccines, such as: Influenza vaccine. This is recommended every year. Tetanus, diphtheria, and acellular pertussis (Tdap, Td) vaccine. You may need a Td booster every 10 years. Zoster vaccine. You may need this after age 24. Pneumococcal 13-valent conjugate (PCV13) vaccine. One dose is recommended after age 58. Pneumococcal polysaccharide  (PPSV23)  vaccine. One dose is recommended after age 25. Talk to your health care provider about which screenings and vaccines you need and how often you need them. This information is not intended to replace advice given to you by your health care provider. Make sure you discuss any questions you have with your health care provider. Document Released: 10/20/2015 Document Revised: 06/12/2016 Document Reviewed: 07/25/2015 Elsevier Interactive Patient Education  2017 [employer] Prevention in the Home Falls can cause injuries. They can happen to people of all ages. There are many things you can do to make your home safe and to help prevent falls. What can I do on the outside of my home? Regularly fix the edges of walkways and driveways and fix any cracks. Remove anything that might make you trip as you walk through a door, such as a raised step or threshold. Trim any bushes or trees on the path to your home. Use bright outdoor lighting. Clear any walking paths of anything that might make someone trip, such as rocks or tools. Regularly check to see if handrails are loose or broken. Make sure that both sides of any steps have handrails. Any raised decks and porches should have guardrails on the edges. Have any leaves, snow, or ice cleared regularly. Use sand or salt on walking paths during winter. Clean up any spills in your garage right away. This includes oil or grease spills. What can I do in the bathroom? Use night lights. Install grab bars by the toilet and in the tub and shower. Do not use towel bars as grab bars. Use non-skid mats or decals in the tub or shower. If you need to sit down in the shower, use a plastic, non-slip stool. Keep the floor dry. Clean up any water that spills on the floor as soon as it happens. Remove soap buildup in the tub or shower regularly. Attach bath mats securely with double-sided non-slip rug tape. Do not have throw rugs and other things on the  floor that can make you trip. What can I do in the bedroom? Use night lights. Make sure that you have a light by your bed that is easy to reach. Do not use any sheets or blankets that are too big for your bed. They should not hang down onto the floor. Have a firm chair that has side arms. You can use this for support while you get dressed. Do not have throw rugs and other things on the floor that can make you trip. What can I do in the kitchen? Clean up any spills right away. Avoid walking on wet floors. Keep items that you use a lot in easy-to-reach places. If you need to reach something above you, use a strong step stool that has a grab bar. Keep electrical cords out of the way. Do not use floor polish or wax that makes floors slippery. If you must use wax, use non-skid floor wax. Do not have throw rugs and other things on the floor that can make you trip. What can I do with my stairs? Do not leave any items on the stairs. Make sure that there are handrails on both sides of the stairs and use them. Fix handrails that are broken or loose. Make sure that handrails are as long as the stairways. Check any carpeting to make sure that it is firmly attached to the stairs. Fix any carpet that is loose or worn. Avoid having throw rugs at the top or bottom of  the stairs. If you do have throw rugs, attach them to the floor with carpet tape. Make sure that you have a light switch at the top of the stairs and the bottom of the stairs. If you do not have them, ask someone to add them for you. What else can I do to help prevent falls? Wear shoes that: Do not have high heels. Have rubber bottoms. Are comfortable and fit you well. Are closed at the toe. Do not wear sandals. If you use a stepladder: Make sure that it is fully opened. Do not climb a closed stepladder. Make sure that both sides of the stepladder are locked into place. Ask someone to hold it for you, if possible. Clearly mark and make  sure that you can see: Any grab bars or handrails. First and last steps. Where the edge of each step is. Use tools that help you move around (mobility aids) if they are needed. These include: Canes. Walkers. Scooters. Crutches. Turn on the lights when you go into a dark area. Replace any light bulbs as soon as they burn out. Set up your furniture so you have a clear path. Avoid moving your furniture around. If any of your floors are uneven, fix them. If there are any pets around you, be aware of where they are. Review your medicines with your doctor. Some medicines can make you feel dizzy. This can increase your chance of falling. Ask your doctor what other things that you can do to help prevent falls. This information is not intended to replace advice given to you by your health care provider. Make sure you discuss any questions you have with your health care provider. Document Released: 07/20/2009 Document Revised: 02/29/2016 Document Reviewed: 10/28/2014 Elsevier Interactive Patient Education  2017 [employer].

## 2022-03-11 NOTE — Progress Notes (Signed)
I connected with [name] [name] today by telephone and verified that I am speaking with the correct person using two identifiers. Location patient: home Location provider: work Persons participating in the virtual visit: Patty Afreen, [place] LPN.   I discussed the limitations, risks, security and privacy concerns of performing an evaluation and management service by telephone and the availability of in person appointments. I also discussed with the patient that there may be a patient responsible charge related to this service. The patient expressed understanding and verbally consented to this telephonic visit.    Interactive audio and video telecommunications were attempted between this provider and patient, however failed, due to patient having technical difficulties OR patient did not have access to video capability.  We continued and completed visit with audio only.     Vital signs may be patient reported or missing.  Subjective:   [name] [name] is a 71 y.o. female who presents for Medicare Annual (Subsequent) preventive examination.  Review of Systems     Cardiac Risk Factors include: advanced age (>34mn, >>89women)     Objective:    Today's Vitals   03/11/22 1056 03/11/22 1057  Weight: 175 lb (79.4 kg)   Height: '5\' 5"'$  (1.651 m)   PainSc:  4    Body mass index is 29.12 kg/m.     03/11/2022   11:05 AM 01/24/2021   12:04 PM 01/19/2020   12:06 PM 09/06/2017   12:41 PM 04/06/2017    5:30 AM 04/05/2017    9:15 PM 05/15/2015    1:49 PM  Advanced Directives  Does Patient Have a Medical Advance Directive? No No No No No No No  Would patient like information on creating a medical advance directive?  No - Patient declined No - Patient declined No - Patient declined   No - patient declined information    Current Medications (verified) Outpatient Encounter Medications as of 03/11/2022  Medication Sig   acyclovir (ZOVIRAX) 200 MG capsule TAKE 1 CAPSULE (200 MG TOTAL) BY MOUTH  DAILY AS NEEDED.   acyclovir cream (ZOVIRAX) 5 % Apply 1 application topically daily as needed.   Ascorbic Acid (VITAMIN C PO) Take 1 tablet by mouth daily.   atenolol (TENORMIN) 25 MG tablet Take 0.5 tablets (12.5 mg total) by mouth daily as needed (palpatations).   CALCIUM CARBONATE PO Take 1 tablet by mouth daily.   Cholecalciferol (VITAMIN D PO) Take 1 tablet by mouth daily.   CINNAMON PO Take 1 capsule by mouth. Takes occasionally   CYANOCOBALAMIN PO Take 1 tablet by mouth daily.   diclofenac sodium (VOLTAREN) 1 % GEL APPLY 4 GRAMS TO THE AFFECTED AREA 3 TIMES DAILY AS NEEDED   ELDERBERRY PO Take 1 tablet by mouth daily.   escitalopram (LEXAPRO) 10 MG tablet Take 0.5 tablets (5 mg total) by mouth daily.   esomeprazole (NEXIUM) 20 MG capsule Take 1 capsule (20 mg total) by mouth daily at 12 noon.   famotidine (PEPCID) 20 MG tablet Take 1 tablet (20 mg total) by mouth 2 (two) times daily.   Garlic 3182MG CAPS Take 1 capsule by mouth daily.   magnesium gluconate (MAGONATE) 500 MG tablet Take 500 mg by mouth daily.   Multiple Vitamin (MULTIVITAMIN) capsule Take 1 capsule by mouth daily.   Omega-3 Fatty Acids (ULTRA OMEGA-3 FISH OIL PO) Take by mouth.   Polyethyl Glycol-Propyl Glycol (SYSTANE OP) Apply 1 drop to eye daily as needed (Dry eyes).   aspirin EC 81 MG  tablet Take 1 tablet (81 mg total) by mouth daily. (Patient not taking: Reported on 03/11/2022)   KRILL OIL PO Take by mouth. Takes 1 tab occasionally (Patient not taking: Reported on 03/11/2022)   No facility-administered encounter medications on file as of 03/11/2022.    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 Echo   Complication of anesthesia HARD TO WAKE   Difficulty sleeping    Eczema    GERD (gastroesophageal reflux disease)    Heart palpitations OCCASIONAL--  TAKES ATENOLOL PRN   Hiatal hernia    HSV-2 infection    buttocks   Hyperlipidemia DIET CONTROL   Melanoma of skin (HCC)    MVP (mitral valve prolapse)    per pt no treatment needed   Normal nuclear stress test 07-09-10   Osteopenia 12/2013   T score -1.6 FRAX 16%/0.8%   PONV (postoperative nausea and vomiting)    Past Surgical History:  Procedure Laterality Date   Carotid US  9/05   Mild, recheck 1 year   Carotid US  10/06   Normal   CHONDROPLASTY  10/23/2011   Procedure: CHONDROPLASTY;  Surgeon: Gearlean Alf, MD;  Location: [facility];  Service: Orthopedics;  Laterality: Left;  Left medial    COLONOSCOPY     ESOPHAGOGASTRODUODENOSCOPY  9/07   Normal   HYSTEROSCOPY WITH RESECTOSCOPE  2000   POLPECTOMY'S   JOINT REPLACEMENT     KNEE ARTHROSCOPY  10/23/2011   Procedure: ARTHROSCOPY KNEE;  Surgeon: Gearlean Alf, MD;  Location: [facility];  Service: Orthopedics;  Laterality: Left;  LEFT KNEE SCOPE WITH DEBRIDEMENT    MELANOMA EXCISION Right 1995   "side"   MOHS SURGERY Left    "side of my nose"   MOLE REMOVAL     "several; all over my body"   MRI neck  3/00   C4-C5 herniation small stenosis mild C4-5, C5-6   TONSILLECTOMY     TOTAL KNEE ARTHROPLASTY Left 11/21/2014   Procedure: LEFT TOTAL KNEE ARTHROPLASTY;  Surgeon: Gearlean Alf, MD;  Location: [facility];  Service: Orthopedics;  Laterality: Left;   TRANSTHORACIC ECHOCARDIOGRAM  06-29-2010   NORMAL LVSF, EF 55-60%,  MILD MR   TUBAL LIGATION  1989   VAGINAL HYSTERECTOMY  2001   complex hyperplasia with mother's history of uterine cancer   Family History  Problem Relation Age of Onset   Cancer Mother        uterine   Obesity Mother    Thrombocytopenia Mother    Diabetes Mother    Osteoarthritis Mother     Alcohol abuse Father    Heart disease Father        CAD in 6's   Emphysema Father    Diabetes Maternal Grandmother        type 2   Heart disease Paternal Aunt        CAD   Heart disease Paternal Uncle        CAD   Osteoarthritis Sister    Osteoarthritis Maternal Aunt    Colon cancer Neg Hx    Breast cancer Neg  Hx    Social History   Socioeconomic History   Marital status: Married    Spouse name: Not on file   Number of children: Not on file   Years of education: Not on file   Highest education level: Not on file  Occupational History   Not on file  Tobacco Use   Smoking status: Former    Packs/day: 1.00    Years: 15.00    Pack years: 15.00    Types: Cigarettes    Quit date: 10/07/1978    Years since quitting: 43.4   Smokeless tobacco: Never  Vaping Use   Vaping Use: Never used  Substance and Sexual Activity   Alcohol use: No    Alcohol/week: 0.0 standard drinks   Drug use: No   Sexual activity: Yes    Birth control/protection: Post-menopausal, Surgical    Comment: HYST-1st intercourse 71 yo-Fewer than 5 partners  Other Topics Concern   Not on file  Social History Narrative   Daily caffeine use.   Patient gets regular exercise.   Social Determinants of Health   Financial Resource Strain: Low Risk    Difficulty of Paying Living Expenses: Not hard at all  Food Insecurity: No Food Insecurity   Worried About Charity fundraiser in the Last Year: Never true   [place] in the Last Year: Never true  Transportation Needs: No Transportation Needs   Lack of Transportation (Medical): No   Lack of Transportation (Non-Medical): No  Physical Activity: Inactive   Days of Exercise per Week: 0 days   Minutes of Exercise per Session: 0 min  Stress: No Stress Concern Present   Feeling of Stress : Not at all  Social Connections: Not on file    Tobacco Counseling Counseling given: Not Answered   Clinical Intake:  Pre-visit preparation completed: Yes  Pain :  0-10 Pain Score: 4  Pain Type: Chronic pain Pain Location: Knee Pain Orientation: Right Pain Descriptors / Indicators: Aching Pain Onset: More than a month ago Pain Frequency: Constant     Nutritional Status: BMI 25 -29 Overweight Diabetes: No  How often do you need to have someone help you when you read instructions, pamphlets, or other written materials from your doctor or pharmacy?: 1 - Never What is the last grade level you completed in school?: 9th grade  Diabetic? no  Interpreter Needed?: No  Information entered by :: NAllen LPN   Activities of Daily Living    03/11/2022   11:06 AM  In your present state of health, do you have any difficulty performing the following activities:  Hearing? 0  Vision? 0  Difficulty concentrating or making decisions? 0  Walking or climbing stairs? 1  Dressing or bathing? 0  Doing errands, shopping? 0  Preparing Food and eating ? N  Using the Toilet? N  In the past six months, have you accidently leaked urine? N  Do you have problems with loss of bowel control? N  Managing your Medications? N  Managing your Finances? N  Housekeeping or managing your Housekeeping? N    Patient Care Team: Tower, Wynelle Fanny, MD as PCP - General  Indicate any recent Medical Services you may have received from other than [facility] providers in the past year (date may be approximate).     Assessment:   This is a routine wellness examination for [name] [name].  Hearing/Vision screen Vision Screening - Comments:: Regular eye exams, Dr. [place]  Dietary issues and exercise activities discussed:  Current Exercise Habits: The patient does not participate in regular exercise at present   Goals Addressed             This Visit's Progress    Patient Stated       03/11/2022, stay active       Depression Screen    03/11/2022   11:06 AM 01/24/2021   12:05 PM 01/19/2020   12:06 PM 12/24/2018   12:05 PM 12/19/2017   11:40 AM 11/29/2013   10:13 AM  PHQ 2/9 Scores   PHQ - 2 Score 0 0 0 0 0 0  PHQ- 9 Score  0 0       Fall Risk    03/11/2022   11:06 AM 01/24/2021   12:05 PM 01/19/2020   12:06 PM 12/24/2018   12:04 PM 12/19/2017   11:40 AM  Fall Risk   Falls in the past year? 0 0 0 0 Yes  Number falls in past yr: 0 0 0  1  Injury with Fall? 0 0 0  Yes  Risk for fall due to : Medication side effect Medication side effect Medication side effect    Follow up Falls evaluation completed;Education provided;Falls prevention discussed Falls evaluation completed;Falls prevention discussed Falls evaluation completed;Falls prevention discussed      FALL RISK PREVENTION PERTAINING TO THE HOME:  Any stairs in or around the home? Yes  If so, are there any without handrails? No  Home free of loose throw rugs in walkways, pet beds, electrical cords, etc? Yes  Adequate lighting in your home to reduce risk of falls? Yes   ASSISTIVE DEVICES UTILIZED TO PREVENT FALLS:  Life alert? No  Use of a cane, walker or w/c? No  Grab bars in the bathroom? Yes  Shower chair or bench in shower? No  Elevated toilet seat or a handicapped toilet? Yes   TIMED UP AND GO:  Was the test performed? No .      Cognitive Function:    01/24/2021   12:06 PM 01/19/2020   12:09 PM  MMSE - Mini Mental State Exam  Not completed: Refused   Orientation to time  5  Orientation to Place  5  Registration  3  Attention/ Calculation  5  Recall  3  Language- repeat  1        03/11/2022   11:08 AM  6CIT Screen  What Year? 0 points  What month? 0 points  What time? 0 points  Count back from 20 0 points  Months in reverse 0 points  Repeat phrase 0 points  Total Score 0 points    Immunizations Immunization History  Administered Date(s) Administered   Influenza Split 10/12/2012   Influenza,inj,Quad PF,6+ Mos 07/22/2014, 10/06/2015   Pneumococcal Conjugate-13 12/19/2017   Pneumococcal Polysaccharide-23 12/24/2018   Td 01/27/2006   Tdap 12/18/2016   Zoster, Live 11/29/2013     TDAP status: Up to date  Flu Vaccine status: Up to date  Pneumococcal vaccine status: Up to date  Covid-19 vaccine status: Declined, Education has been provided regarding the importance of this vaccine but patient still declined. Advised may receive this vaccine at local pharmacy or Health Dept.or vaccine clinic. Aware to provide a copy of the vaccination record if obtained from local pharmacy or Health Dept. Verbalized acceptance and understanding.  Qualifies for Shingles Vaccine? Yes   Zostavax completed Yes   Shingrix Completed?: No.    Education has been provided regarding the importance of this vaccine. Patient has  been advised to call insurance company to determine out of pocket expense if they have not yet received this vaccine. Advised may also receive vaccine at local pharmacy or Health Dept. Verbalized acceptance and understanding.  Screening Tests Health Maintenance  Topic Date Due   Zoster Vaccines- Shingrix (1 of 2) Never done   COVID-19 Vaccine (1) 03/27/2022 (Originally 09/27/1951)   INFLUENZA VACCINE  05/07/2022   MAMMOGRAM  02/06/2024   COLONOSCOPY (Pts 45-69yr Insurance coverage will need to be confirmed)  05/28/2025   TETANUS/TDAP  12/19/2026   Pneumonia Vaccine 70 Years old  Completed   DEXA SCAN  Completed   Hepatitis C Screening  Completed   HPV VACCINES  Aged Out    Health Maintenance  Health Maintenance Due  Topic Date Due   Zoster Vaccines- Shingrix (1 of 2) Never done    Colorectal cancer screening: Type of screening: Colonoscopy. Completed 05/29/2015. Repeat every 10 years  Mammogram status: Completed 02/05/2022. Repeat every year  Bone Density status: Completed 11/30/2019.  Lung Cancer Screening: (Low Dose CT Chest recommended if Age 70-80years, 30 pack-year currently smoking OR have quit w/in 15years.) does not qualify.   Lung Cancer Screening Referral: no  Additional Screening:  Hepatitis C Screening: does qualify; Completed  09/28/2015  Vision Screening: Recommended annual ophthalmology exams for early detection of glaucoma and other disorders of the eye. Is the patient up to date with their annual eye exam?  Yes  Who is the provider or what is the name of the office in which the patient attends annual eye exams? Dr. [place]If pt is not established with a provider, would they like to be referred to a provider to establish care? No .   Dental Screening: Recommended annual dental exams for proper oral hygiene  Community Resource Referral / Chronic Care Management: CRR required this visit?  No   CCM required this visit?  No      Plan:     I have personally reviewed and noted the following in the patient's chart:   Medical and social history Use of alcohol, tobacco or illicit drugs  Current medications and supplements including opioid prescriptions.  Functional ability and status Nutritional status Physical activity Advanced directives List of other physicians Hospitalizations, surgeries, and ER visits in previous 12 months Vitals Screenings to include cognitive, depression, and falls Referrals and appointments  In addition, I have reviewed and discussed with patient certain preventive protocols, quality metrics, and best practice recommendations. A written personalized care plan for preventive services as well as general preventive health recommendations were provided to patient.     NKellie Simmering LPN   [id number]  Nurse Notes: none  Due to this being a virtual visit, the after visit summary with patients personalized plan was offered to patient via mail or my-chart.  Patient preferred to pick up at office at next visit

## 2022-03-12 ENCOUNTER — Other Ambulatory Visit (INDEPENDENT_AMBULATORY_CARE_PROVIDER_SITE_OTHER): Payer: PPO

## 2022-03-12 DIAGNOSIS — R5382 Chronic fatigue, unspecified: Secondary | ICD-10-CM | POA: Diagnosis not present

## 2022-03-12 DIAGNOSIS — E559 Vitamin D deficiency, unspecified: Secondary | ICD-10-CM | POA: Diagnosis not present

## 2022-03-12 DIAGNOSIS — E7800 Pure hypercholesterolemia, unspecified: Secondary | ICD-10-CM

## 2022-03-12 DIAGNOSIS — R790 Abnormal level of blood mineral: Secondary | ICD-10-CM

## 2022-03-12 LAB — COMPREHENSIVE METABOLIC PANEL
ALT: 21 U/L (ref 0–35)
AST: 20 U/L (ref 0–37)
Albumin: 4.1 g/dL (ref 3.5–5.2)
Alkaline Phosphatase: 83 U/L (ref 39–117)
BUN: 19 mg/dL (ref 6–23)
CO2: 27 mEq/L (ref 19–32)
Calcium: 9.6 mg/dL (ref 8.4–10.5)
Chloride: 104 mEq/L (ref 96–112)
Creatinine, Ser: 0.72 mg/dL (ref 0.40–1.20)
GFR: 84.99 mL/min (ref >60.00)
Glucose, Bld: 77 mg/dL (ref 70–99)
Potassium: 4 mEq/L (ref 3.5–5.1)
Sodium: 139 mEq/L (ref 135–145)
Total Bilirubin: 0.8 mg/dL (ref 0.2–1.2)
Total Protein: 6.4 g/dL (ref 6.0–8.3)

## 2022-03-12 LAB — CBC WITH DIFFERENTIAL/PLATELET
Basophils Absolute: 0 10*3/uL (ref 0.0–0.1)
Basophils Relative: 0.6 % (ref 0.0–3.0)
Eosinophils Absolute: 0.1 10*3/uL (ref 0.0–0.7)
Eosinophils Relative: 1.7 % (ref 0.0–5.0)
HCT: 41.4 % (ref 36.0–46.0)
Hemoglobin: 14.3 g/dL (ref 12.0–15.0)
Lymphocytes Relative: 32.3 % (ref 12.0–46.0)
Lymphs Abs: 1.6 10*3/uL (ref 0.7–4.0)
MCHC: 34.5 g/dL (ref 30.0–36.0)
MCV: 96.4 fl (ref 78.0–100.0)
Monocytes Absolute: 0.4 10*3/uL (ref 0.1–1.0)
Monocytes Relative: 8 % (ref 3.0–12.0)
Neutro Abs: 2.8 10*3/uL (ref 1.4–7.7)
Neutrophils Relative %: 57.4 % (ref 43.0–77.0)
Platelets: 194 10*3/uL (ref 150.0–400.0)
RBC: 4.29 Mil/uL (ref 3.87–5.11)
RDW: 13.7 % (ref 11.5–15.5)
WBC: 4.8 10*3/uL (ref 4.0–10.5)

## 2022-03-12 LAB — LIPID PANEL
Cholesterol: 189 mg/dL (ref 0–200)
HDL: 58.4 mg/dL (ref >39.00)
LDL Cholesterol: 115 mg/dL — ABNORMAL HIGH (ref 0–99)
NonHDL: 130.63
Total CHOL/HDL Ratio: 3
Triglycerides: 79 mg/dL (ref 0.0–149.0)
VLDL: 15.8 mg/dL (ref 0.0–40.0)

## 2022-03-12 LAB — FERRITIN: Ferritin: 49 ng/mL (ref 10.0–291.0)

## 2022-03-12 LAB — TSH: TSH: 1.57 u[IU]/mL (ref 0.35–5.50)

## 2022-03-12 LAB — IRON: Iron: 133 ug/dL (ref 42–145)

## 2022-03-12 LAB — VITAMIN D 25 HYDROXY (VIT D DEFICIENCY, FRACTURES): VITD: 90.53 ng/mL (ref 30.00–100.00)

## 2022-03-19 ENCOUNTER — Encounter: Payer: Self-pay | Admitting: Family Medicine

## 2022-03-19 ENCOUNTER — Ambulatory Visit (INDEPENDENT_AMBULATORY_CARE_PROVIDER_SITE_OTHER): Payer: PPO | Admitting: Family Medicine

## 2022-03-19 VITALS — BP 118/72 | HR 83 | Temp 97.9°F | Resp 16 | Ht 65.0 in | Wt 181.4 lb

## 2022-03-19 DIAGNOSIS — E7800 Pure hypercholesterolemia, unspecified: Secondary | ICD-10-CM | POA: Diagnosis not present

## 2022-03-19 DIAGNOSIS — F419 Anxiety disorder, unspecified: Secondary | ICD-10-CM

## 2022-03-19 DIAGNOSIS — E559 Vitamin D deficiency, unspecified: Secondary | ICD-10-CM | POA: Diagnosis not present

## 2022-03-19 DIAGNOSIS — Z0000 Encounter for general adult medical examination without abnormal findings: Secondary | ICD-10-CM

## 2022-03-19 DIAGNOSIS — K219 Gastro-esophageal reflux disease without esophagitis: Secondary | ICD-10-CM | POA: Diagnosis not present

## 2022-03-19 DIAGNOSIS — R790 Abnormal level of blood mineral: Secondary | ICD-10-CM

## 2022-03-19 DIAGNOSIS — T471X5A Adverse effect of other antacids and anti-gastric-secretion drugs, initial encounter: Secondary | ICD-10-CM | POA: Diagnosis not present

## 2022-03-19 DIAGNOSIS — M8589 Other specified disorders of bone density and structure, multiple sites: Secondary | ICD-10-CM

## 2022-03-19 MED ORDER — FAMOTIDINE 20 MG PO TABS: 20.0000 mg | ORAL_TABLET | Freq: Two times a day (BID) | ORAL | 3 refills | Status: DC

## 2022-03-19 MED ORDER — ESCITALOPRAM OXALATE 10 MG PO TABS: 5.0000 mg | ORAL_TABLET | Freq: Every day | ORAL | 3 refills | Status: DC

## 2022-03-19 NOTE — Progress Notes (Signed)
Subjective:    Patient ID: [name] [name], female    DOB: 04-06-1951, 71 y.o.   MRN: [id number]  HPI Here for health maintenance exam and to review chronic medical problems    Wt Readings from Last 3 Encounters:  03/19/22 181 lb 6 oz (82.3 kg)  03/11/22 175 lb (79.4 kg)  01/01/22 182 lb 8 oz (82.8 kg)   30.18 kg/m  Struggles to loose weight   Stopped exercise for a while  Now back to using bike  Cannot walk due to her knee    Amw reviewed 6/5 23  Immunization History  Administered Date(s) Administered   Influenza Split 10/12/2012   Influenza,inj,Quad PF,6+ Mos 07/22/2014, 10/06/2015   Pneumococcal Conjugate-13 12/19/2017   Pneumococcal Polysaccharide-23 12/24/2018   Td 01/27/2006   Tdap 12/18/2016   Zoster, Live 11/29/2013    Shingrix status : declines   Mammogram 02/2022  Self breast exam : no lumps   Colonoscopy 05/2015 with 10 y recall    Dexa 11/2019  Osteopenia Followed by gyn / Dr Quincy Simmonds  Is planning a follow up  Falls: none Fractures: none Supplements -vit D 5000 iu daily  D level 90.3   Mood     03/11/2022   11:06 AM 01/24/2021   12:05 PM 01/19/2020   12:06 PM 12/24/2018   12:05 PM 12/19/2017   11:40 AM  Depression screen PHQ 2/9  Decreased Interest 0 0 0 0 0  Down, Depressed, Hopeless 0 0 0 0 0  PHQ - 2 Score 0 0 0 0 0  Altered sleeping  0 0    Tired, decreased energy  0 0    Change in appetite  0 0    Feeling bad or failure about yourself   0 0    Trouble concentrating  0 0    Moving slowly or fidgety/restless  0 0    Suicidal thoughts  0 0    PHQ-9 Score  0 0    Difficult doing work/chores  Not difficult at all Not difficult at all     Taking lexapro 5 mg daily and doing well  Mood is good   BP Readings from Last 3 Encounters:  03/19/22 118/72  01/01/22 128/72  12/14/21 102/70   Pulse Readings from Last 3 Encounters:  03/19/22 83  01/01/22 68  12/14/21 68   Some neuropathy symptoms    GERD Pepcid 20 mg bid  Nexium 20 mg  daily - uses 1-2 times weekly Better now than it was  Occ a little nausea   Lab Results  Component Value Date   VITAMINB12 809 12/14/2021   Lab Results  Component Value Date   IRON 133 03/12/2022   TIBC 343.0 01/01/2022   FERRITIN 49.0 03/12/2022   Lab Results  Component Value Date   WBC 4.8 03/12/2022   HGB 14.3 03/12/2022   HCT 41.4 03/12/2022   MCV 96.4 03/12/2022   PLT 194.0 03/12/2022    Hyperlipidemia Lab Results  Component Value Date   CHOL 189 03/12/2022   CHOL 195 02/06/2021   CHOL 182 01/18/2020   Lab Results  Component Value Date   HDL 58.40 03/12/2022   HDL 53.90 02/06/2021   HDL 43.50 01/18/2020   Lab Results  Component Value Date   LDLCALC 115 (H) 03/12/2022   LDLCALC 117 (H) 02/06/2021   LDLCALC 111 (H) 01/18/2020   Lab Results  Component Value Date   TRIG 79.0 03/12/2022   TRIG 118.0 02/06/2021  TRIG 139.0 01/18/2020   Lab Results  Component Value Date   CHOLHDL 3 03/12/2022   CHOLHDL 4 02/06/2021   CHOLHDL 4 01/18/2020   Lab Results  Component Value Date   LDLDIRECT 124.4 09/12/2010   Takes omega 3  Improved  Using ground [place] now     Lab Results  Component Value Date   TSH 1.57 03/12/2022   Patient Active Problem List   Diagnosis Date Noted   Adverse effect of proton pump inhibitor 12/14/2021   Fatigue 12/14/2021   Medicare annual wellness visit, initial 12/19/2017   Anxiety 09/24/2017   Sleep disorder 09/12/2017   Urinary frequency 05/02/2017   Palpitations 05/22/2015   OA (osteoarthritis) of knee 11/21/2014   Colon cancer screening 11/29/2013   Routine general medical examination at a health care facility 09/16/2011   DERMATITIS, ATOPIC 11/27/2010   URTICARIA DUE TO COLD OR HEAT 11/27/2010   STRESS REACTION, ACUTE, WITH EMOTIONAL DISTURBANCE 09/14/2010   EXTERNAL HEMORRHOIDS WITHOUT MENTION COMP 06/25/2010   ABNORMAL EKG 12/26/2009   OTHER CHRONIC SINUSITIS 10/19/2009   Vitamin D deficiency 04/21/2009    ALLERGIC RHINITIS 11/09/2008   GERD 07/28/2008   Osteopenia 03/14/2008   HYPERCHOLESTEROLEMIA, PURE 03/13/2007   HIATAL HERNIA 01/12/2007   Past Medical History:  Diagnosis Date   Acute meniscal tear of knee    Arthritis    "neck; left knee before replacement" (05/10/2015)   Basal cell cancer 5/08   Breast cyst    "left"   Chest pain 9/11   [facility]) and palpitations - ruled out for MI with nl. Echo   Complication of anesthesia HARD TO WAKE   Difficulty sleeping    Eczema    GERD (gastroesophageal reflux disease)    Heart palpitations OCCASIONAL--  TAKES ATENOLOL PRN   Hiatal hernia    HSV-2 infection    buttocks   Hyperlipidemia DIET CONTROL   Melanoma of skin (HCC)    MVP (mitral valve prolapse)    per pt no treatment needed   Normal nuclear stress test 07-09-10   Osteopenia 12/2013   T score -1.6 FRAX 16%/0.8%   PONV (postoperative nausea and vomiting)    Past Surgical History:  Procedure Laterality Date   Carotid US  9/05   Mild, recheck 1 year   Carotid US  10/06   Normal   CHONDROPLASTY  10/23/2011   Procedure: CHONDROPLASTY;  Surgeon: Gearlean Alf, MD;  Location: [place];  Service: Orthopedics;  Laterality: Left;  Left medial    COLONOSCOPY     ESOPHAGOGASTRODUODENOSCOPY  9/07   Normal   HYSTEROSCOPY WITH RESECTOSCOPE  2000   POLPECTOMY'S   JOINT REPLACEMENT     KNEE ARTHROSCOPY  10/23/2011   Procedure: ARTHROSCOPY KNEE;  Surgeon: Gearlean Alf, MD;  Location: [facility];  Service: Orthopedics;  Laterality: Left;  LEFT KNEE SCOPE WITH DEBRIDEMENT    MELANOMA EXCISION Right 1995   "side"   MOHS SURGERY Left    "side of my nose"   MOLE REMOVAL     "several; all over my body"   MRI neck  3/00   C4-C5 herniation small stenosis mild C4-5, C5-6   TONSILLECTOMY     TOTAL KNEE ARTHROPLASTY Left 11/21/2014   Procedure: LEFT TOTAL KNEE ARTHROPLASTY;  Surgeon: Gearlean Alf, MD;  Location: [facility];  Service: Orthopedics;   Laterality: Left;   TRANSTHORACIC ECHOCARDIOGRAM  06-29-2010   NORMAL LVSF, EF 55-60%,  MILD MR   TUBAL  LIGATION  1989   VAGINAL HYSTERECTOMY  2001   complex hyperplasia with mother's history of uterine cancer   Social History   Tobacco Use   Smoking status: Former    Packs/day: 1.00    Years: 15.00    Total pack years: 15.00    Types: Cigarettes    Quit date: 10/07/1978    Years since quitting: 43.4   Smokeless tobacco: Never  Vaping Use   Vaping Use: Never used  Substance Use Topics   Alcohol use: No    Alcohol/week: 0.0 standard drinks of alcohol   Drug use: No   Family History  Problem Relation Age of Onset   Cancer Mother        uterine   Obesity Mother    Thrombocytopenia Mother    Diabetes Mother    Osteoarthritis Mother    Alcohol abuse Father    Heart disease Father        CAD in 31's   Emphysema Father    Diabetes Maternal Grandmother        type 2   Heart disease Paternal Aunt        CAD   Heart disease Paternal Uncle        CAD   Osteoarthritis Sister    Osteoarthritis Maternal Aunt    Colon cancer Neg Hx    Breast cancer Neg Hx    Allergies  Allergen Reactions   Amoxicillin Rash   Codeine Rash   Macrobid [Nitrofurantoin]     GI issues   Clarithromycin Other (See Comments)    REACTION: reaction not known   Trazodone And Nefazodone Palpitations   Current Outpatient Medications on File Prior to Visit  Medication Sig Dispense Refill   acyclovir (ZOVIRAX) 200 MG capsule TAKE 1 CAPSULE (200 MG TOTAL) BY MOUTH DAILY AS NEEDED. 90 capsule 1   acyclovir cream (ZOVIRAX) 5 % Apply 1 application topically daily as needed. 5 g 5   Ascorbic Acid (VITAMIN C PO) Take 1 tablet by mouth daily.     atenolol (TENORMIN) 25 MG tablet Take 0.5 tablets (12.5 mg total) by mouth daily as needed (palpatations). 30 tablet 5   CALCIUM CARBONATE PO Take 1 tablet by mouth daily.     Cholecalciferol (VITAMIN D PO) Take 1 tablet by mouth daily.     CINNAMON PO Take 1  capsule by mouth. Takes occasionally     CYANOCOBALAMIN PO Take 1 tablet by mouth daily.     diclofenac sodium (VOLTAREN) 1 % GEL APPLY 4 GRAMS TO THE AFFECTED AREA 3 TIMES DAILY AS NEEDED 100 g 3   ELDERBERRY PO Take 1 tablet by mouth daily.     esomeprazole (NEXIUM) 20 MG capsule Take 1 capsule (20 mg total) by mouth daily at 12 noon. 1 capsule 0   Garlic 371 MG CAPS Take 1 capsule by mouth daily.     magnesium gluconate (MAGONATE) 500 MG tablet Take 500 mg by mouth daily.     Multiple Vitamin (MULTIVITAMIN) capsule Take 1 capsule by mouth daily.     Omega-3 Fatty Acids (ULTRA OMEGA-3 FISH OIL PO) Take by mouth.     Polyethyl Glycol-Propyl Glycol (SYSTANE OP) Apply 1 drop to eye daily as needed (Dry eyes).     aspirin EC 81 MG tablet Take 1 tablet (81 mg total) by mouth daily. (Patient not taking: Reported on 03/11/2022)     KRILL OIL PO Take by mouth. Takes 1 tab occasionally (Patient not taking: Reported  on 03/11/2022)     No current facility-administered medications on file prior to visit.    Review of Systems  Constitutional:  Negative for activity change, appetite change, fatigue, fever and unexpected weight change.  HENT:  Negative for congestion, ear pain, rhinorrhea, sinus pressure and sore throat.   Eyes:  Negative for pain, redness and visual disturbance.  Respiratory:  Negative for cough, shortness of breath and wheezing.   Cardiovascular:  Negative for chest pain and palpitations.  Gastrointestinal:  Negative for abdominal pain, blood in stool, constipation and diarrhea.  Endocrine: Negative for polydipsia and polyuria.  Genitourinary:  Negative for dysuria, frequency and urgency.  Musculoskeletal:  Negative for arthralgias, back pain and myalgias.  Skin:  Negative for pallor and rash.  Allergic/Immunologic: Negative for environmental allergies.  Neurological:  Negative for dizziness, syncope and headaches.  Hematological:  Negative for adenopathy. Does not bruise/bleed easily.   Psychiatric/Behavioral:  Negative for decreased concentration and dysphoric mood. The patient is not nervous/anxious.        Objective:   Physical Exam Constitutional:      General: She is not in acute distress.    Appearance: Normal appearance. She is well-developed. She is obese. She is not ill-appearing or diaphoretic.  HENT:     Head: Normocephalic and atraumatic.     Right Ear: Tympanic membrane, ear canal and external ear normal.     Left Ear: Tympanic membrane, ear canal and external ear normal.     Nose: Nose normal. No congestion.     Mouth/Throat:     Mouth: Mucous membranes are moist.     Pharynx: Oropharynx is clear. No posterior oropharyngeal erythema.  Eyes:     General: No scleral icterus.    Extraocular Movements: Extraocular movements intact.     Conjunctiva/sclera: Conjunctivae normal.     Pupils: Pupils are equal, round, and reactive to light.  Neck:     Thyroid: No thyromegaly.     Vascular: No carotid bruit or JVD.  Cardiovascular:     Rate and Rhythm: Normal rate and regular rhythm.     Pulses: Normal pulses.     Heart sounds: Normal heart sounds.     No gallop.  Pulmonary:     Effort: Pulmonary effort is normal. No respiratory distress.     Breath sounds: Normal breath sounds. No wheezing.     Comments: Good air exch Chest:     Chest wall: No tenderness.  Abdominal:     General: Bowel sounds are normal. There is no distension or abdominal bruit.     Palpations: Abdomen is soft. There is no mass.     Tenderness: There is no abdominal tenderness.     Hernia: No hernia is present.  Genitourinary:    Comments: Breast exam: No mass, nodules, thickening, tenderness, bulging, retraction, inflamation, nipple discharge or skin changes noted.  No axillary or clavicular LA.     Musculoskeletal:        General: No tenderness. Normal range of motion.     Cervical back: Normal range of motion and neck supple. No rigidity. No muscular tenderness.     Right  lower leg: No edema.     Left lower leg: No edema.     Comments: No kyphosis   Lymphadenopathy:     Cervical: No cervical adenopathy.  Skin:    General: Skin is warm and dry.     Coloration: Skin is not pale.     Findings: No erythema or  rash.     Comments: Solar lentigines diffusely   Neurological:     Mental Status: She is alert. Mental status is at baseline.     Cranial Nerves: No cranial nerve deficit.     Motor: No abnormal muscle tone.     Coordination: Coordination normal.     Gait: Gait normal.     Deep Tendon Reflexes: Reflexes are normal and symmetric. Reflexes normal.  Psychiatric:        Mood and Affect: Mood normal.        Cognition and Memory: Cognition and memory normal.           Assessment & Plan:   Problem List Items Addressed This Visit       Digestive   GERD    Able to wean nexium to 1-2 times weekly  pepcid 20 mg bid is helpful Watching diet       Relevant Medications   famotidine (PEPCID) 20 MG tablet     Musculoskeletal and Integument   Osteopenia    dexa 2021 No falls/fx Needs to cut D dose for borderline high level  Enc exercise   Will get dexa with next gyn appt         Other   RESOLVED: Abnormal serum iron level    This is in the normal range now      Adverse effect of proton pump inhibitor    B12 and D levels are nl Taking nexium 1-2 times weekly now      Anxiety   Relevant Medications   escitalopram (LEXAPRO) 10 MG tablet   HYPERCHOLESTEROLEMIA, PURE    Disc goals for lipids and reasons to control them Rev last labs with pt Rev low sat fat diet in detail  Improved with better eating  commended      Routine general medical examination at a health care facility - Primary    Reviewed health habits including diet and exercise and skin cancer prevention Reviewed appropriate screening tests for age  Also reviewed health mt list, fam hx and immunization status , as well as social and family history   See HIP Labs  reviewed  Declines shingrix vaccine  Mammogram utd  Colonoscopy utd dexa due-will get at gyn phq :0 amw reviewed       Vitamin D deficiency    Level of over 90  Taking D3 5000 iu daily  Can dec to 2000-4000 iu daily  Disc imp to bone and overall health

## 2022-03-19 NOTE — Assessment & Plan Note (Signed)
This is in the normal range now

## 2022-03-19 NOTE — Patient Instructions (Addendum)
If you are interested in the new shingles vaccine (Shingrix) - call your local pharmacy to check on coverage and availability  If affordable, get on a wait list at your pharmacy to get the vaccine.   When you go back to gyn -you may be due for a bone density test    Go down to 2000-4000 iu of vitamin D3 daily  You can take 5000 iu every other day Level is almost too high   Exercise  Wear sun protection

## 2022-03-19 NOTE — Assessment & Plan Note (Signed)
Reviewed health habits including diet and exercise and skin cancer prevention Reviewed appropriate screening tests for age  Also reviewed health mt list, fam hx and immunization status , as well as social and family history   See HIP Labs reviewed  Declines shingrix vaccine  Mammogram utd  Colonoscopy utd dexa due-will get at gyn phq :0 amw reviewed

## 2022-03-19 NOTE — Assessment & Plan Note (Signed)
B12 and D levels are nl Taking nexium 1-2 times weekly now

## 2022-03-19 NOTE — Assessment & Plan Note (Signed)
Able to wean nexium to 1-2 times weekly  pepcid 20 mg bid is helpful Watching diet

## 2022-03-19 NOTE — Assessment & Plan Note (Signed)
Level of over 90  Taking D3 5000 iu daily  Can dec to 2000-4000 iu daily  Disc imp to bone and overall health

## 2022-03-19 NOTE — Assessment & Plan Note (Signed)
Disc goals for lipids and reasons to control them Rev last labs with pt Rev low sat fat diet in detail  Improved with better eating  commended

## 2022-03-19 NOTE — Assessment & Plan Note (Signed)
dexa 2021 No falls/fx Needs to cut D dose for borderline high level  Enc exercise   Will get dexa with next gyn appt

## 2022-04-22 ENCOUNTER — Encounter: Payer: Self-pay | Admitting: Family Medicine

## 2022-04-22 ENCOUNTER — Ambulatory Visit (INDEPENDENT_AMBULATORY_CARE_PROVIDER_SITE_OTHER): Payer: PPO | Admitting: Family Medicine

## 2022-04-22 VITALS — BP 110/72 | HR 60 | Temp 97.9°F | Ht 65.0 in | Wt 183.2 lb

## 2022-04-22 DIAGNOSIS — R82998 Other abnormal findings in urine: Secondary | ICD-10-CM | POA: Diagnosis not present

## 2022-04-22 DIAGNOSIS — N3000 Acute cystitis without hematuria: Secondary | ICD-10-CM | POA: Diagnosis not present

## 2022-04-22 DIAGNOSIS — R350 Frequency of micturition: Secondary | ICD-10-CM

## 2022-04-22 LAB — POCT URINALYSIS DIP (CLINITEK)
Bilirubin, UA: NEGATIVE
Blood, UA: NEGATIVE
Glucose, UA: NEGATIVE mg/dL
Ketones, POC UA: NEGATIVE mg/dL
Nitrite, UA: NEGATIVE
POC PROTEIN,UA: NEGATIVE
Spec Grav, UA: <=1.005 — AB (ref 1.010–1.025)
Urobilinogen, UA: 0.2 E.U./dL
pH, UA: 6 (ref 5.0–8.0)

## 2022-04-22 MED ORDER — SULFAMETHOXAZOLE-TRIMETHOPRIM 800-160 MG PO TABS: 1.0000 | ORAL_TABLET | Freq: Two times a day (BID) | ORAL | 0 refills | Status: DC

## 2022-04-22 NOTE — Patient Instructions (Signed)
I think you have a uti  Take the bactrim as directed  Drink lots of water   We will with your culture result   If symptoms worsen in the meantime please let us know

## 2022-04-22 NOTE — Progress Notes (Signed)
Subjective:    Patient ID: [name] [name], female    DOB: 1951/07/21, 71 y.o.   MRN: [id number]  HPI Pt presents for urinary symptoms  Wt Readings from Last 3 Encounters:  03/19/22 181 lb 6 oz (82.3 kg)  03/11/22 175 lb (79.4 kg)  01/01/22 182 lb 8 oz (82.8 kg)      Over a week ago she started  Drank a lot of water and tried azo   Feeling tired  Frequency- more than 4 times at night  Burning and then pain with urination  Pain in bladder area  Sometimes she has urgency also  No blood in urine   Sometimes it looks a little cloudy/white  Her vitamin makes it bright  No flank pain  Had a little low back pain   No fever  Is wiped out  A little nausea but no vomiting     Last tx for uti in march Tx with bactrim but then her culture was negative She had e coli uti in the fall pan sensitive   Results for orders placed or performed in visit on 04/22/22  POCT URINALYSIS DIP (CLINITEK)  Result Value Ref Range   Color, UA yellow yellow   Clarity, UA clear clear   Glucose, UA negative negative mg/dL   Bilirubin, UA negative negative   Ketones, POC UA negative negative mg/dL   Spec Grav, UA <=1.005 (A) 1.010 - 1.025   Blood, UA negative negative   pH, UA 6.0 5.0 - 8.0   POC PROTEIN,UA negative negative, trace   Urobilinogen, UA 0.2 0.2 or 1.0 E.U./dL   Nitrite, UA Negative Negative   Leukocytes, UA Large (3+) (A) Negative   Not a big water drinker She did increase water for this   Patient Active Problem List   Diagnosis Date Noted   Adverse effect of proton pump inhibitor 12/14/2021   Fatigue 12/14/2021   Medicare annual wellness visit, initial 12/19/2017   Anxiety 09/24/2017   Sleep disorder 09/12/2017   Urinary frequency 05/02/2017   Acute cystitis 11/15/2016   Palpitations 05/22/2015   OA (osteoarthritis) of knee 11/21/2014   Colon cancer screening 11/29/2013   Routine general medical examination at a health care facility 09/16/2011   DERMATITIS, ATOPIC  11/27/2010   URTICARIA DUE TO COLD OR HEAT 11/27/2010   STRESS REACTION, ACUTE, WITH EMOTIONAL DISTURBANCE 09/14/2010   EXTERNAL HEMORRHOIDS WITHOUT MENTION COMP 06/25/2010   ABNORMAL EKG 12/26/2009   OTHER CHRONIC SINUSITIS 10/19/2009   Vitamin D deficiency 04/21/2009   ALLERGIC RHINITIS 11/09/2008   GERD 07/28/2008   Osteopenia 03/14/2008   HYPERCHOLESTEROLEMIA, PURE 03/13/2007   HIATAL HERNIA 01/12/2007   Past Medical History:  Diagnosis Date   Acute meniscal tear of knee    Arthritis    "neck; left knee before replacement" (05/10/2015)   Basal cell cancer 5/08   Breast cyst    "left"   Chest pain 9/11   (Hosp) and palpitations - ruled out for MI with nl. Echo   Complication of anesthesia HARD TO WAKE   Difficulty sleeping    Eczema    GERD (gastroesophageal reflux disease)    Heart palpitations OCCASIONAL--  TAKES ATENOLOL PRN   Hiatal hernia    HSV-2 infection    buttocks   Hyperlipidemia DIET CONTROL   Melanoma of skin (HCC)    MVP (mitral valve prolapse)    per pt no treatment needed   Normal nuclear stress test 07-09-10   Osteopenia 12/2013  T score -1.6 FRAX 16%/0.8%   PONV (postoperative nausea and vomiting)    Past Surgical History:  Procedure Laterality Date   Carotid US  9/05   Mild, recheck 1 year   Carotid US  10/06   Normal   CHONDROPLASTY  10/23/2011   Procedure: CHONDROPLASTY;  Surgeon: Gearlean Alf, MD;  Location: [facility];  Service: Orthopedics;  Laterality: Left;  Left medial    COLONOSCOPY     ESOPHAGOGASTRODUODENOSCOPY  9/07   Normal   HYSTEROSCOPY WITH RESECTOSCOPE  2000   POLPECTOMY'S   JOINT REPLACEMENT     KNEE ARTHROSCOPY  10/23/2011   Procedure: ARTHROSCOPY KNEE;  Surgeon: Gearlean Alf, MD;  Location: [facility];  Service: Orthopedics;  Laterality: Left;  LEFT KNEE SCOPE WITH DEBRIDEMENT    MELANOMA EXCISION Right 1995   "side"   MOHS SURGERY Left    "side of my nose"   MOLE REMOVAL      "several; all over my body"   MRI neck  3/00   C4-C5 herniation small stenosis mild C4-5, C5-6   TONSILLECTOMY     TOTAL KNEE ARTHROPLASTY Left 11/21/2014   Procedure: LEFT TOTAL KNEE ARTHROPLASTY;  Surgeon: Gearlean Alf, MD;  Location: [facility];  Service: Orthopedics;  Laterality: Left;   TRANSTHORACIC ECHOCARDIOGRAM  06-29-2010   NORMAL LVSF, EF 55-60%,  MILD MR   TUBAL LIGATION  1989   VAGINAL HYSTERECTOMY  2001   complex hyperplasia with mother's history of uterine cancer   Social History   Tobacco Use   Smoking status: Former    Packs/day: 1.00    Years: 15.00    Total pack years: 15.00    Types: Cigarettes    Quit date: 10/07/1978    Years since quitting: 43.5   Smokeless tobacco: Never  Vaping Use   Vaping Use: Never used  Substance Use Topics   Alcohol use: No    Alcohol/week: 0.0 standard drinks of alcohol   Drug use: No   Family History  Problem Relation Age of Onset   Cancer Mother        uterine   Obesity Mother    Thrombocytopenia Mother    Diabetes Mother    Osteoarthritis Mother    Alcohol abuse Father    Heart disease Father        CAD in 10's   Emphysema Father    Diabetes Maternal Grandmother        type 2   Heart disease Paternal Aunt        CAD   Heart disease Paternal Uncle        CAD   Osteoarthritis Sister    Osteoarthritis Maternal Aunt    Colon cancer Neg Hx    Breast cancer Neg Hx    Allergies  Allergen Reactions   Amoxicillin Rash   Codeine Rash   Macrobid [Nitrofurantoin]     GI issues   Clarithromycin Other (See Comments)    REACTION: reaction not known   Trazodone And Nefazodone Palpitations   Current Outpatient Medications on File Prior to Visit  Medication Sig Dispense Refill   acyclovir (ZOVIRAX) 200 MG capsule TAKE 1 CAPSULE (200 MG TOTAL) BY MOUTH DAILY AS NEEDED. 90 capsule 1   acyclovir cream (ZOVIRAX) 5 % Apply 1 application topically daily as needed. 5 g 5   Ascorbic Acid (VITAMIN C PO) Take 1 tablet by mouth  daily.     aspirin EC 81 MG tablet  Take 1 tablet (81 mg total) by mouth daily. (Patient not taking: Reported on 03/11/2022)     atenolol (TENORMIN) 25 MG tablet Take 0.5 tablets (12.5 mg total) by mouth daily as needed (palpatations). 30 tablet 5   CALCIUM CARBONATE PO Take 1 tablet by mouth daily.     Cholecalciferol (VITAMIN D PO) Take 1 tablet by mouth daily.     CINNAMON PO Take 1 capsule by mouth. Takes occasionally     CYANOCOBALAMIN PO Take 1 tablet by mouth daily.     diclofenac sodium (VOLTAREN) 1 % GEL APPLY 4 GRAMS TO THE AFFECTED AREA 3 TIMES DAILY AS NEEDED 100 g 3   ELDERBERRY PO Take 1 tablet by mouth daily.     escitalopram (LEXAPRO) 10 MG tablet Take 0.5 tablets (5 mg total) by mouth daily. 45 tablet 3   esomeprazole (NEXIUM) 20 MG capsule Take 1 capsule (20 mg total) by mouth daily at 12 noon. 1 capsule 0   famotidine (PEPCID) 20 MG tablet Take 1 tablet (20 mg total) by mouth 2 (two) times daily. 161 tablet 3   Garlic 096 MG CAPS Take 1 capsule by mouth daily.     KRILL OIL PO Take by mouth. Takes 1 tab occasionally (Patient not taking: Reported on 03/11/2022)     magnesium gluconate (MAGONATE) 500 MG tablet Take 500 mg by mouth daily.     Multiple Vitamin (MULTIVITAMIN) capsule Take 1 capsule by mouth daily.     Omega-3 Fatty Acids (ULTRA OMEGA-3 FISH OIL PO) Take by mouth.     Polyethyl Glycol-Propyl Glycol (SYSTANE OP) Apply 1 drop to eye daily as needed (Dry eyes).     No current facility-administered medications on file prior to visit.    Review of Systems  Constitutional:  Positive for fatigue. Negative for activity change, appetite change and fever.  HENT:  Negative for congestion and sore throat.   Eyes:  Negative for itching and visual disturbance.  Respiratory:  Negative for cough and shortness of breath.   Cardiovascular:  Negative for leg swelling.  Gastrointestinal:  Negative for abdominal distention, abdominal pain, constipation, diarrhea and nausea.   Endocrine: Negative for cold intolerance and polydipsia.  Genitourinary:  Positive for dysuria, frequency and urgency. Negative for difficulty urinating, flank pain and hematuria.  Musculoskeletal:  Negative for myalgias.  Skin:  Negative for rash.  Allergic/Immunologic: Negative for immunocompromised state.  Neurological:  Negative for dizziness and weakness.  Hematological:  Negative for adenopathy.       Objective:   Physical Exam Constitutional:      General: She is not in acute distress.    Appearance: Normal appearance. She is well-developed. She is obese. She is not ill-appearing or diaphoretic.  HENT:     Head: Normocephalic and atraumatic.  Eyes:     Conjunctiva/sclera: Conjunctivae normal.     Pupils: Pupils are equal, round, and reactive to light.  Cardiovascular:     Rate and Rhythm: Normal rate and regular rhythm.     Heart sounds: Normal heart sounds.  Pulmonary:     Effort: Pulmonary effort is normal.     Breath sounds: Normal breath sounds.  Abdominal:     General: Bowel sounds are normal. There is no distension.     Palpations: Abdomen is soft.     Tenderness: There is abdominal tenderness. There is no right CVA tenderness, left CVA tenderness or rebound.     Comments: No cva tenderness  Mild suprapubic tenderness  Musculoskeletal:  Cervical back: Normal range of motion and neck supple.  Lymphadenopathy:     Cervical: No cervical adenopathy.  Skin:    Findings: No rash.  Neurological:     Mental Status: She is alert.           Assessment & Plan:   Problem List Items Addressed This Visit       Genitourinary   Acute cystitis    Uncomplicated with voiding symptoms  Px bactrim to take bid for 5 d Enc water intake  inst to call if symptoms worsen or if flank pain or fever Culture pending-will change tx if needed   Update if not starting to improve in a week or if worsening        Other Visit Diagnoses     Urine frequency    -   Primary   Relevant Orders   POCT URINALYSIS DIP (CLINITEK) (Completed)   Urine Culture   Urine white blood cells increased       Relevant Orders   POCT URINALYSIS DIP (CLINITEK) (Completed)   Urine Culture

## 2022-04-22 NOTE — Assessment & Plan Note (Signed)
Uncomplicated with voiding symptoms  Px bactrim to take bid for 5 d Enc water intake  inst to call if symptoms worsen or if flank pain or fever Culture pending-will change tx if needed   Update if not starting to improve in a week or if worsening

## 2022-04-25 ENCOUNTER — Telehealth: Payer: Self-pay

## 2022-04-25 LAB — URINE CULTURE
MICRO NUMBER:: 13655734
SPECIMEN QUALITY:: ADEQUATE

## 2022-04-25 NOTE — Telephone Encounter (Signed)
-----   Message from Abner Greenspan, MD sent at 04/25/2022 12:02 PM EDT ----- The bactrim should work well for this uti  Please let us know if symptoms to not improve

## 2022-04-25 NOTE — Telephone Encounter (Signed)
Patient returned call

## 2022-04-25 NOTE — Telephone Encounter (Signed)
I left a message for the patient to return my call.

## 2022-04-26 NOTE — Telephone Encounter (Signed)
Spoke with patient regarding her lab result, see result note

## 2022-07-17 DIAGNOSIS — L570 Actinic keratosis: Secondary | ICD-10-CM | POA: Diagnosis not present

## 2022-07-17 DIAGNOSIS — Z85828 Personal history of other malignant neoplasm of skin: Secondary | ICD-10-CM | POA: Diagnosis not present

## 2022-07-17 DIAGNOSIS — D485 Neoplasm of uncertain behavior of skin: Secondary | ICD-10-CM | POA: Diagnosis not present

## 2022-07-17 DIAGNOSIS — B079 Viral wart, unspecified: Secondary | ICD-10-CM | POA: Diagnosis not present

## 2022-07-17 DIAGNOSIS — L820 Inflamed seborrheic keratosis: Secondary | ICD-10-CM | POA: Diagnosis not present

## 2022-07-17 DIAGNOSIS — B078 Other viral warts: Secondary | ICD-10-CM | POA: Diagnosis not present

## 2022-09-02 DIAGNOSIS — H35371 Puckering of macula, right eye: Secondary | ICD-10-CM | POA: Diagnosis not present

## 2022-11-18 ENCOUNTER — Encounter: Payer: Self-pay | Admitting: Family Medicine

## 2022-11-18 ENCOUNTER — Ambulatory Visit (INDEPENDENT_AMBULATORY_CARE_PROVIDER_SITE_OTHER): Payer: PPO | Admitting: Family Medicine

## 2022-11-18 VITALS — BP 119/75 | HR 57 | Temp 97.7°F | Ht 65.0 in | Wt 177.4 lb

## 2022-11-18 DIAGNOSIS — R000 Tachycardia, unspecified: Secondary | ICD-10-CM

## 2022-11-18 DIAGNOSIS — R5382 Chronic fatigue, unspecified: Secondary | ICD-10-CM

## 2022-11-18 DIAGNOSIS — R002 Palpitations: Secondary | ICD-10-CM

## 2022-11-18 DIAGNOSIS — R4589 Other symptoms and signs involving emotional state: Secondary | ICD-10-CM | POA: Diagnosis not present

## 2022-11-18 DIAGNOSIS — F419 Anxiety disorder, unspecified: Secondary | ICD-10-CM

## 2022-11-18 LAB — CBC WITH DIFFERENTIAL/PLATELET
Basophils Absolute: 0 10*3/uL (ref 0.0–0.1)
Basophils Relative: 0.3 % (ref 0.0–3.0)
Eosinophils Absolute: 0 10*3/uL (ref 0.0–0.7)
Eosinophils Relative: 0.5 % (ref 0.0–5.0)
HCT: 45.3 % (ref 36.0–46.0)
Hemoglobin: 15.4 g/dL — ABNORMAL HIGH (ref 12.0–15.0)
Lymphocytes Relative: 24.3 % (ref 12.0–46.0)
Lymphs Abs: 1.6 10*3/uL (ref 0.7–4.0)
MCHC: 34 g/dL (ref 30.0–36.0)
MCV: 95.9 fl (ref 78.0–100.0)
Monocytes Absolute: 0.3 10*3/uL (ref 0.1–1.0)
Monocytes Relative: 5.3 % (ref 3.0–12.0)
Neutro Abs: 4.6 10*3/uL (ref 1.4–7.7)
Neutrophils Relative %: 69.6 % (ref 43.0–77.0)
Platelets: 231 10*3/uL (ref 150.0–400.0)
RBC: 4.72 Mil/uL (ref 3.87–5.11)
RDW: 13.4 % (ref 11.5–15.5)
WBC: 6.6 10*3/uL (ref 4.0–10.5)

## 2022-11-18 LAB — TSH: TSH: 3.83 u[IU]/mL (ref 0.35–5.50)

## 2022-11-18 LAB — BASIC METABOLIC PANEL
BUN: 13 mg/dL (ref 6–23)
CO2: 26 mEq/L (ref 19–32)
Calcium: 9.9 mg/dL (ref 8.4–10.5)
Chloride: 105 mEq/L (ref 96–112)
Creatinine, Ser: 0.76 mg/dL (ref 0.40–1.20)
GFR: 79.27 mL/min (ref >60.00)
Glucose, Bld: 94 mg/dL (ref 70–99)
Potassium: 4.2 mEq/L (ref 3.5–5.1)
Sodium: 140 mEq/L (ref 135–145)

## 2022-11-18 NOTE — Assessment & Plan Note (Signed)
Lab today  In setting of palpitations and anxiety and poor sleep

## 2022-11-18 NOTE — Assessment & Plan Note (Signed)
Some more stress Particularly around the holidays and fam issues  Declines counseling

## 2022-11-18 NOTE — Assessment & Plan Note (Signed)
Acute on chronic Takes atenolol prn (took last night) Rev last cardiology visit (2016) and studies She is worried about cardiac health   Ref done so she can re connect  Will continue to monitor symptoms and heart rate No acute changes on EKG- NSR with ventricular ectopy

## 2022-11-18 NOTE — Patient Instructions (Addendum)
When or if you are ready to try a different medicine for anxiety and panic attacks let us know   Also if you were open to counseling ( mental health therapist)  let me know   Avoid all caffeine   Try and get regular exercise   I will refer you to cardiology to discuss the palpitations  You will get a call to set that up (if not let us know)  Take the atenolol as needed    Eat a healthy balance diet  Try to get most of your carbohydrates from produce (with the exception of white potatoes)  Eat less bread/pasta/rice/snack foods/cereals/sweets and other items from the middle of the grocery store (processed carbs)

## 2022-11-18 NOTE — Assessment & Plan Note (Signed)
Pt takes lexapro 5 mg -did not tolerate 10 mg /was too sedating  Struggling with anxious mood, and per history sounds like panic attacks This makes her baseline palpiitations worse  Declines other medication (I think different ssri may help) Declines counseling Reviewed stressors/ coping techniques/symptoms/ support sources/ tx options and side effects in detail today Enc her to call if she changes her mind  Enc to avoid caffeine Practice self care

## 2022-11-18 NOTE — Progress Notes (Signed)
Subjective:    Patient ID: [name] [name], female    DOB: [birth year], 72 y.o.   MRN: [id number]  HPI Pt presents for multiple complaints including back pain, palpitations, anxiety and fatigue with chills    Wt Readings from Last 3 Encounters:  11/18/22 177 lb 6 oz (80.5 kg)  04/22/22 183 lb 3.2 oz (83.1 kg)  03/19/22 181 lb 6 oz (82.3 kg)   29.52 kg/m  Vitals:   11/18/22 1145 11/18/22 1215  BP: 136/80 119/75  Pulse: (!) 57   Temp: 97.7 F (36.5 C)   SpO2: 97%      Is trying to loose weight /eat better    Has been feeling bad since xmas   Mid back pain   Chest symptoms - weak feeling like she may pass out   Episode of shaking last night- 20 minutes Was not cold  No temp  Did not feel cold  ? Anxious  Has to suddenly take a deep breath    (more when at rest then during movement)   No wheeze or cough   Caffeine- tea through the day   She eats regularly - at least 2 meals and a snack  Had some protein shakes   Is able to exercise    Stress level  Very high at xmas-had to host 30 people  Has a grandchild that may be sick     On lexapro 5 mg - for several years   BP Readings from Last 3 Encounters:  11/18/22 136/80  04/22/22 110/72  03/19/22 118/72    Pulse Readings from Last 3 Encounters:  11/18/22 (!) 57  04/22/22 60  03/19/22 83   Takes atenolol 12.5 mg prn  Had to take 1/2 pill last night    EKG today  SSR rate of 65 with ectopic ventricular beats (close to every 4th) No ST, T changes    Lab Results  Component Value Date   CREATININE 0.72 03/12/2022   BUN 19 03/12/2022   NA 139 03/12/2022   K 4.0 03/12/2022   CL 104 03/12/2022   CO2 27 03/12/2022   Lab Results  Component Value Date   WBC 4.8 03/12/2022   HGB 14.3 03/12/2022   HCT 41.4 03/12/2022   MCV 96.4 03/12/2022   PLT 194.0 03/12/2022   Lab Results  Component Value Date   ALT 21 03/12/2022   AST 20 03/12/2022   ALKPHOS 83 03/12/2022   BILITOT 0.8 03/12/2022     Lab Results  Component Value Date   TSH 1.57 03/12/2022   Patient Active Problem List   Diagnosis Date Noted   Adverse effect of proton pump inhibitor 12/14/2021   Fatigue 12/14/2021   Medicare annual wellness visit, initial 12/19/2017   Anxious mood 09/24/2017   Sleep disorder 09/12/2017   Urinary frequency 05/02/2017   Palpitations 05/22/2015   OA (osteoarthritis) of knee 11/21/2014   Colon cancer screening 11/29/2013   Routine general medical examination at a health care facility 09/16/2011   DERMATITIS, ATOPIC 11/27/2010   URTICARIA DUE TO COLD OR HEAT 11/27/2010   STRESS REACTION, ACUTE, WITH EMOTIONAL DISTURBANCE 09/14/2010   EXTERNAL HEMORRHOIDS WITHOUT MENTION COMP 06/25/2010   ABNORMAL EKG 12/26/2009   OTHER CHRONIC SINUSITIS 10/19/2009   Vitamin D deficiency 04/21/2009   ALLERGIC RHINITIS 11/09/2008   GERD 07/28/2008   Osteopenia 03/14/2008   HYPERCHOLESTEROLEMIA, PURE 03/13/2007   HIATAL HERNIA 01/12/2007   Past Medical History:  Diagnosis Date   Acute meniscal tear  of knee    Arthritis    "neck; left knee before replacement" (05/10/2015)   Basal cell cancer 02/2007   Breast cyst    "left"   Chest pain 06/2010   [facility]) and palpitations - ruled out for MI with nl. Echo   Complication of anesthesia HARD TO WAKE   Difficulty sleeping    Eczema    GERD (gastroesophageal reflux disease)    Heart palpitations OCCASIONAL--  TAKES ATENOLOL PRN   Hiatal hernia    HSV-2 infection    buttocks   Hyperlipidemia DIET CONTROL   Melanoma of skin (HCC)    MVP (mitral valve prolapse)    per pt no treatment needed   Normal nuclear stress test 07/09/2010   Osteopenia 12/2013   T score -1.6 FRAX 16%/0.8%   PONV (postoperative nausea and vomiting)    Sleep disorder 09/12/2017   Past Surgical History:  Procedure Laterality Date   Carotid US  9/05   Mild, recheck 1 year   Carotid US  10/06   Normal   CHONDROPLASTY  10/23/2011   Procedure: CHONDROPLASTY;  Surgeon:  Gearlean Alf, MD;  Location: [facility];  Service: Orthopedics;  Laterality: Left;  Left medial    COLONOSCOPY     ESOPHAGOGASTRODUODENOSCOPY  9/07   Normal   HYSTEROSCOPY WITH RESECTOSCOPE  2000   POLPECTOMY'S   JOINT REPLACEMENT     KNEE ARTHROSCOPY  10/23/2011   Procedure: ARTHROSCOPY KNEE;  Surgeon: Gearlean Alf, MD;  [facility];  Service: Orthopedics;  Laterality: Left;  LEFT KNEE SCOPE WITH DEBRIDEMENT    MELANOMA EXCISION Right 1995   "side"   MOHS SURGERY Left    "side of my nose"   MOLE REMOVAL     "several; all over my body"   MRI neck  3/00   C4-C5 herniation small stenosis mild C4-5, C5-6   TONSILLECTOMY     TOTAL KNEE ARTHROPLASTY Left 11/21/2014   Procedure: LEFT TOTAL KNEE ARTHROPLASTY;  Surgeon: Gearlean Alf, MD;  Location: [facility];  Service: Orthopedics;  Laterality: Left;   TRANSTHORACIC ECHOCARDIOGRAM  06-29-2010   NORMAL LVSF, EF 55-60%,  MILD MR   TUBAL LIGATION  1989   VAGINAL HYSTERECTOMY  2001   complex hyperplasia with mother's history of uterine cancer   Social History   Tobacco Use   Smoking status: Former    Packs/day: 1.00    Years: 15.00    Total pack years: 15.00    Types: Cigarettes    Quit date: 10/07/1978    Years since quitting: 44.1   Smokeless tobacco: Never  Vaping Use   Vaping Use: Never used  Substance Use Topics   Alcohol use: No    Alcohol/week: 0.0 standard drinks of alcohol   Drug use: No   Family History  Problem Relation Age of Onset   Cancer Mother        uterine   Obesity Mother    Thrombocytopenia Mother    Diabetes Mother    Osteoarthritis Mother    Alcohol abuse Father    Heart disease Father        CAD in 13's   Emphysema Father    Diabetes Maternal Grandmother        type 2   Heart disease Paternal Aunt        CAD   Heart disease Paternal Uncle        CAD   Osteoarthritis Sister  Osteoarthritis Maternal Aunt    Colon cancer Neg Hx    Breast cancer Neg  Hx    Allergies  Allergen Reactions   Amoxicillin Rash   Codeine Rash   Macrobid [Nitrofurantoin]     GI issues   Clarithromycin Other (See Comments)    REACTION: reaction not known   Trazodone And Nefazodone Palpitations   Current Outpatient Medications on File Prior to Visit  Medication Sig Dispense Refill   acyclovir (ZOVIRAX) 200 MG capsule TAKE 1 CAPSULE (200 MG TOTAL) BY MOUTH DAILY AS NEEDED. 90 capsule 1   acyclovir cream (ZOVIRAX) 5 % Apply 1 application topically daily as needed. 5 g 5   Ascorbic Acid (VITAMIN C PO) Take 1 tablet by mouth daily.     aspirin EC 81 MG tablet Take 1 tablet (81 mg total) by mouth daily.     atenolol (TENORMIN) 25 MG tablet Take 0.5 tablets (12.5 mg total) by mouth daily as needed (palpatations). 30 tablet 5   CALCIUM CARBONATE PO Take 1 tablet by mouth daily.     Cholecalciferol (VITAMIN D PO) Take 1 tablet by mouth daily.     CINNAMON PO Take 1 capsule by mouth. Takes occasionally     CYANOCOBALAMIN PO Take 1 tablet by mouth daily.     diclofenac sodium (VOLTAREN) 1 % GEL APPLY 4 GRAMS TO THE AFFECTED AREA 3 TIMES DAILY AS NEEDED 100 g 3   ELDERBERRY PO Take 1 tablet by mouth daily.     escitalopram (LEXAPRO) 10 MG tablet Take 0.5 tablets (5 mg total) by mouth daily. 45 tablet 3   esomeprazole (NEXIUM) 20 MG capsule Take 1 capsule (20 mg total) by mouth daily at 12 noon. 1 capsule 0   famotidine (PEPCID) 20 MG tablet Take 1 tablet (20 mg total) by mouth 2 (two) times daily. 99991111 tablet 3   Garlic [id number] MG CAPS Take 1 capsule by mouth daily.     KRILL OIL PO Take by mouth. Takes 1 tab occasionally     magnesium gluconate (MAGONATE) 500 MG tablet Take 500 mg by mouth daily.     Multiple Vitamin (MULTIVITAMIN) capsule Take 1 capsule by mouth daily.     Omega-3 Fatty Acids (ULTRA OMEGA-3 FISH OIL PO) Take by mouth.     Polyethyl Glycol-Propyl Glycol (SYSTANE OP) Apply 1 drop to eye daily as needed (Dry eyes).     No current facility-administered  medications on file prior to visit.    Review of Systems  Constitutional:  Positive for fatigue. Negative for activity change, appetite change, fever and unexpected weight change.  HENT:  Negative for congestion, ear pain, rhinorrhea, sinus pressure and sore throat.   Eyes:  Negative for pain, redness and visual disturbance.  Respiratory:  Negative for cough, chest tightness, shortness of breath and wheezing.   Cardiovascular:  Negative for chest pain and palpitations.       Feels funny weak feeling in her chest   Gastrointestinal:  Negative for abdominal pain, blood in stool, constipation and diarrhea.       Some loose bms last night   Endocrine: Negative for polydipsia and polyuria.  Genitourinary:  Negative for dysuria, frequency and urgency.  Musculoskeletal:  Negative for arthralgias, back pain and myalgias.  Skin:  Negative for pallor and rash.  Allergic/Immunologic: Negative for environmental allergies.  Neurological:  Negative for dizziness, syncope, speech difficulty, light-headedness, numbness and headaches.  Hematological:  Negative for adenopathy. Does not bruise/bleed easily.  Psychiatric/Behavioral:  Positive for decreased concentration and sleep disturbance. Negative for dysphoric mood, hallucinations and self-injury. The patient is nervous/anxious.        Objective:   Physical Exam Constitutional:      General: She is not in acute distress.    Appearance: Normal appearance. She is well-developed.     Comments: Overweight   HENT:     Head: Normocephalic and atraumatic.     Mouth/Throat:     Mouth: Mucous membranes are moist.  Eyes:     General: No scleral icterus.       Right eye: No discharge.        Left eye: No discharge.     Extraocular Movements: Extraocular movements intact.     Conjunctiva/sclera: Conjunctivae normal.     Pupils: Pupils are equal, round, and reactive to light.  Neck:     Thyroid: No thyromegaly.     Vascular: No carotid bruit or JVD.   Cardiovascular:     Rate and Rhythm: Regular rhythm. Bradycardia present.     Pulses: Normal pulses.     Heart sounds: Normal heart sounds. No murmur heard.    No gallop.  Pulmonary:     Effort: Pulmonary effort is normal. No respiratory distress.     Breath sounds: Normal breath sounds. No stridor. No wheezing, rhonchi or rales.  Chest:     Chest wall: No tenderness.  Abdominal:     General: There is no distension or abdominal bruit.     Palpations: Abdomen is soft. There is no mass.     Tenderness: There is no abdominal tenderness. There is no guarding or rebound.  Musculoskeletal:     Cervical back: Normal range of motion and neck supple.     Right lower leg: No edema.     Left lower leg: No edema.     Comments: No back tenderness  Lymphadenopathy:     Cervical: No cervical adenopathy.  Skin:    General: Skin is warm and dry.     Coloration: Skin is not pale.     Findings: No rash.  Neurological:     Mental Status: She is alert.     Cranial Nerves: No cranial nerve deficit.     Coordination: Coordination normal.     Deep Tendon Reflexes: Reflexes are normal and symmetric. Reflexes normal.     Comments: No focal weakness  Psychiatric:        Attention and Perception: Attention normal.        Mood and Affect: Mood is anxious. Affect is not angry or tearful.        Speech: Speech is not rapid and pressured or delayed.        Behavior: Behavior is not agitated or slowed.        Cognition and Memory: Cognition and memory normal.     Comments: Speech is not rapid   Candidly discusses symptoms and stressors             Assessment & Plan:   Problem List Items Addressed This Visit       Other   Anxious mood    Pt takes lexapro 5 mg -did not tolerate 10 mg /was too sedating  Struggling with anxious mood, and per history sounds like panic attacks This makes her baseline palpiitations worse  Declines other medication (I think different ssri may help) Declines  counseling Reviewed stressors/ coping techniques/symptoms/ support sources/ tx options and side effects in detail today Enc her  to call if she changes her mind  Enc to avoid caffeine Practice self care          Fatigue    Lab today  In setting of palpitations and anxiety and poor sleep      Relevant Orders   CBC with Differential/Platelet   TSH   Palpitations - Primary    Acute on chronic Takes atenolol prn (took last night) Rev last cardiology visit (2016) and studies She is worried about cardiac health   Ref done so she can re connect  Will continue to monitor symptoms and heart rate No acute changes on EKG- NSR with ventricular ectopy      Relevant Orders   Ambulatory referral to Cardiology   CBC with Differential/Platelet   TSH   Basic metabolic panel   STRESS REACTION, ACUTE, WITH EMOTIONAL DISTURBANCE    Some more stress Particularly around the holidays and fam issues  Declines counseling      Other Visit Diagnoses     Tachycardia       Relevant Orders   EKG 12-Lead (Completed)

## 2022-11-19 ENCOUNTER — Encounter: Payer: Self-pay | Admitting: *Deleted

## 2023-01-28 ENCOUNTER — Ambulatory Visit: Payer: PPO | Admitting: Cardiovascular Disease

## 2023-02-18 ENCOUNTER — Telehealth: Payer: Self-pay | Admitting: Family Medicine

## 2023-02-18 NOTE — Telephone Encounter (Signed)
Contacted [name] [name] to schedule their annual wellness visit. Appointment made for 03/12/2023.  [facility] Care Guide [facility] TEAM Direct Dial: [phone]

## 2023-02-24 ENCOUNTER — Other Ambulatory Visit: Payer: Self-pay | Admitting: Family Medicine

## 2023-02-24 DIAGNOSIS — Z1231 Encounter for screening mammogram for malignant neoplasm of breast: Secondary | ICD-10-CM

## 2023-03-11 DIAGNOSIS — D2272 Melanocytic nevi of left lower limb, including hip: Secondary | ICD-10-CM | POA: Diagnosis not present

## 2023-03-11 DIAGNOSIS — D2262 Melanocytic nevi of left upper limb, including shoulder: Secondary | ICD-10-CM | POA: Diagnosis not present

## 2023-03-11 DIAGNOSIS — L814 Other melanin hyperpigmentation: Secondary | ICD-10-CM | POA: Diagnosis not present

## 2023-03-11 DIAGNOSIS — L570 Actinic keratosis: Secondary | ICD-10-CM | POA: Diagnosis not present

## 2023-03-11 DIAGNOSIS — D225 Melanocytic nevi of trunk: Secondary | ICD-10-CM | POA: Diagnosis not present

## 2023-03-11 DIAGNOSIS — D2261 Melanocytic nevi of right upper limb, including shoulder: Secondary | ICD-10-CM | POA: Diagnosis not present

## 2023-03-11 DIAGNOSIS — Z85828 Personal history of other malignant neoplasm of skin: Secondary | ICD-10-CM | POA: Diagnosis not present

## 2023-03-11 DIAGNOSIS — I788 Other diseases of capillaries: Secondary | ICD-10-CM | POA: Diagnosis not present

## 2023-03-11 DIAGNOSIS — D2271 Melanocytic nevi of right lower limb, including hip: Secondary | ICD-10-CM | POA: Diagnosis not present

## 2023-03-11 DIAGNOSIS — D1801 Hemangioma of skin and subcutaneous tissue: Secondary | ICD-10-CM | POA: Diagnosis not present

## 2023-03-11 DIAGNOSIS — L723 Sebaceous cyst: Secondary | ICD-10-CM | POA: Diagnosis not present

## 2023-03-11 DIAGNOSIS — D224 Melanocytic nevi of scalp and neck: Secondary | ICD-10-CM | POA: Diagnosis not present

## 2023-03-12 ENCOUNTER — Ambulatory Visit (INDEPENDENT_AMBULATORY_CARE_PROVIDER_SITE_OTHER): Payer: PPO

## 2023-03-12 VITALS — Ht 65.0 in | Wt 175.0 lb

## 2023-03-12 DIAGNOSIS — Z0000 Encounter for general adult medical examination without abnormal findings: Secondary | ICD-10-CM

## 2023-03-12 NOTE — Patient Instructions (Signed)
[name] [name] , Thank you for taking time to come for your Medicare Wellness Visit. I appreciate your ongoing commitment to your health goals. Please review the following plan we discussed and let me know if I can assist you in the future.   These are the goals we discussed:  Goals      Patient Stated     01/19/2020, I will continue to ride my exercise bike 4-5 days a week for 45 minutes.      Patient Stated     01/24/2021, I will continue to ride my exercise bike 2-3 days a week for 45 minutes.      Patient Stated     03/11/2022, stay active     Patient Stated     Exercise more        This is a list of the screening recommended for you and due dates:  Health Maintenance  Topic Date Due   COVID-19 Vaccine (1) Never done   Zoster (Shingles) Vaccine (1 of 2) Never done   Flu Shot  05/08/2023   Mammogram  02/06/2024   Medicare Annual Wellness Visit  03/11/2024   Colon Cancer Screening  05/28/2025   DTaP/Tdap/Td vaccine (3 - Td or Tdap) 12/19/2026   Pneumonia Vaccine  Completed   DEXA scan (bone density measurement)  Completed   Hepatitis C Screening  Completed   HPV Vaccine  Aged Out    Advanced directives: Please bring a copy of your health care power of attorney and living will to the office to be added to your chart at your convenience.   Conditions/risks identified: Aim for 30 minutes of exercise or brisk walking, 6-8 glasses of water, and 5 servings of fruits and vegetables each day.   Next appointment: Follow up in one year for your annual wellness visit 03/15/24 @ 2pm televisit   Preventive Care 65 Years and Older, Female Preventive care refers to lifestyle choices and visits with your health care provider that can promote health and wellness. What does preventive care include? A yearly physical exam. This is also called an annual well check. Dental exams once or twice a year. Routine eye exams. Ask your health care provider how often you should have your eyes  checked. Personal lifestyle choices, including: Daily care of your teeth and gums. Regular physical activity. Eating a healthy diet. Avoiding tobacco and drug use. Limiting alcohol use. Practicing safe sex. Taking low-dose aspirin every day. Taking vitamin and mineral supplements as recommended by your health care provider. What happens during an annual well check? The services and screenings done by your health care provider during your annual well check will depend on your age, overall health, lifestyle risk factors, and family history of disease. Counseling  Your health care provider may ask you questions about your: Alcohol use. Tobacco use. Drug use. Emotional well-being. Home and relationship well-being. Sexual activity. Eating habits. History of falls. Memory and ability to understand (cognition). Work and work Astronomer. Reproductive health. Screening  You may have the following tests or measurements: Height, weight, and BMI. Blood pressure. Lipid and cholesterol levels. These may be checked every 5 years, or more frequently if you are over 71 years old. Skin check. Lung cancer screening. You may have this screening every year starting at age 17 if you have a 30-pack-year history of smoking and currently smoke or have quit within the past 15 years. Fecal occult blood test (FOBT) of the stool. You may have this test every year starting  at age 18. Flexible sigmoidoscopy or colonoscopy. You may have a sigmoidoscopy every 5 years or a colonoscopy every 10 years starting at age 77. Hepatitis C blood test. Hepatitis B blood test. Sexually transmitted disease (STD) testing. Diabetes screening. This is done by checking your blood sugar (glucose) after you have not eaten for a while (fasting). You may have this done every 1-3 years. Bone density scan. This is done to screen for osteoporosis. You may have this done starting at age 108. Mammogram. This may be done every 1-2  years. Talk to your health care provider about how often you should have regular mammograms. Talk with your health care provider about your test results, treatment options, and if necessary, the need for more tests. Vaccines  Your health care provider may recommend certain vaccines, such as: Influenza vaccine. This is recommended every year. Tetanus, diphtheria, and acellular pertussis (Tdap, Td) vaccine. You may need a Td booster every 10 years. Zoster vaccine. You may need this after age 7. Pneumococcal 13-valent conjugate (PCV13) vaccine. One dose is recommended after age 33. Pneumococcal polysaccharide (PPSV23) vaccine. One dose is recommended after age 60. Talk to your health care provider about which screenings and vaccines you need and how often you need them. This information is not intended to replace advice given to you by your health care provider. Make sure you discuss any questions you have with your health care provider. Document Released: 10/20/2015 Document Revised: 06/12/2016 Document Reviewed: 07/25/2015 Elsevier Interactive Patient Education  2017 [employer].  Fall Prevention in the Home Falls can cause injuries. They can happen to people of all ages. There are many things you can do to make your home safe and to help prevent falls. What can I do on the outside of my home? Regularly fix the edges of walkways and driveways and fix any cracks. Remove anything that might make you trip as you walk through a door, such as a raised step or threshold. Trim any bushes or trees on the path to your home. Use bright outdoor lighting. Clear any walking paths of anything that might make someone trip, such as rocks or tools. Regularly check to see if handrails are loose or broken. Make sure that both sides of any steps have handrails. Any raised decks and porches should have guardrails on the edges. Have any leaves, snow, or ice cleared regularly. Use sand or salt on walking paths  during winter. Clean up any spills in your garage right away. This includes oil or grease spills. What can I do in the bathroom? Use night lights. Install grab bars by the toilet and in the tub and shower. Do not use towel bars as grab bars. Use non-skid mats or decals in the tub or shower. If you need to sit down in the shower, use a plastic, non-slip stool. Keep the floor dry. Clean up any water that spills on the floor as soon as it happens. Remove soap buildup in the tub or shower regularly. Attach bath mats securely with double-sided non-slip rug tape. Do not have throw rugs and other things on the floor that can make you trip. What can I do in the bedroom? Use night lights. Make sure that you have a light by your bed that is easy to reach. Do not use any sheets or blankets that are too big for your bed. They should not hang down onto the floor. Have a firm chair that has side arms. You can use this for support  while you get dressed. Do not have throw rugs and other things on the floor that can make you trip. What can I do in the kitchen? Clean up any spills right away. Avoid walking on wet floors. Keep items that you use a lot in easy-to-reach places. If you need to reach something above you, use a strong step stool that has a grab bar. Keep electrical cords out of the way. Do not use floor polish or wax that makes floors slippery. If you must use wax, use non-skid floor wax. Do not have throw rugs and other things on the floor that can make you trip. What can I do with my stairs? Do not leave any items on the stairs. Make sure that there are handrails on both sides of the stairs and use them. Fix handrails that are broken or loose. Make sure that handrails are as long as the stairways. Check any carpeting to make sure that it is firmly attached to the stairs. Fix any carpet that is loose or worn. Avoid having throw rugs at the top or bottom of the stairs. If you do have throw  rugs, attach them to the floor with carpet tape. Make sure that you have a light switch at the top of the stairs and the bottom of the stairs. If you do not have them, ask someone to add them for you. What else can I do to help prevent falls? Wear shoes that: Do not have high heels. Have rubber bottoms. Are comfortable and fit you well. Are closed at the toe. Do not wear sandals. If you use a stepladder: Make sure that it is fully opened. Do not climb a closed stepladder. Make sure that both sides of the stepladder are locked into place. Ask someone to hold it for you, if possible. Clearly mark and make sure that you can see: Any grab bars or handrails. First and last steps. Where the edge of each step is. Use tools that help you move around (mobility aids) if they are needed. These include: Canes. Walkers. Scooters. Crutches. Turn on the lights when you go into a dark area. Replace any light bulbs as soon as they burn out. Set up your furniture so you have a clear path. Avoid moving your furniture around. If any of your floors are uneven, fix them. If there are any pets around you, be aware of where they are. Review your medicines with your doctor. Some medicines can make you feel dizzy. This can increase your chance of falling. Ask your doctor what other things that you can do to help prevent falls. This information is not intended to replace advice given to you by your health care provider. Make sure you discuss any questions you have with your health care provider. Document Released: 07/20/2009 Document Revised: 02/29/2016 Document Reviewed: 10/28/2014 Elsevier Interactive Patient Education  2017 [employer].

## 2023-03-12 NOTE — Progress Notes (Signed)
I connected with  [name] [name] on 03/12/23 by a audio enabled telemedicine application and verified that I am speaking with the correct person using two identifiers.  Patient Location: Home  Provider Location: Home Office  I discussed the limitations of evaluation and management by telemedicine. The patient expressed understanding and agreed to proceed.  Subjective:   [name] [name] is a 72 y.o. female who presents for Medicare Annual (Subsequent) preventive examination.  Review of Systems      Cardiac Risk Factors include: advanced age (>34men, >36 women);sedentary lifestyle     Objective:    Today's Vitals   03/12/23 1434 03/12/23 1435  Weight: 175 lb (79.4 kg)   Height: 5\' 5"  (1.651 m)   PainSc:  5    Body mass index is 29.12 kg/m.     03/12/2023    2:45 PM 03/11/2022   11:05 AM 01/24/2021   12:04 PM 01/19/2020   12:06 PM 09/06/2017   12:41 PM 04/06/2017    5:30 AM 04/05/2017    9:15 PM  Advanced Directives  Does Patient Have a Medical Advance Directive? No No No No No No No  Would patient like information on creating a medical advance directive? No - Patient declined  No - Patient declined No - Patient declined No - Patient declined      Current Medications (verified) Outpatient Encounter Medications as of 03/12/2023  Medication Sig   acyclovir (ZOVIRAX) 200 MG capsule TAKE 1 CAPSULE (200 MG TOTAL) BY MOUTH DAILY AS NEEDED.   acyclovir cream (ZOVIRAX) 5 % Apply 1 application topically daily as needed.   Ascorbic Acid (VITAMIN C PO) Take 1 tablet by mouth daily.   atenolol (TENORMIN) 25 MG tablet Take 0.5 tablets (12.5 mg total) by mouth daily as needed (palpatations).   CALCIUM CARBONATE PO Take 1 tablet by mouth daily.   Cholecalciferol (VITAMIN D PO) Take 1 tablet by mouth daily.   CINNAMON PO Take 1 capsule by mouth. Takes occasionally   CYANOCOBALAMIN PO Take 1 tablet by mouth daily.   diclofenac sodium (VOLTAREN) 1 % GEL APPLY 4 GRAMS TO THE AFFECTED AREA 3 TIMES  DAILY AS NEEDED   ELDERBERRY PO Take 1 tablet by mouth daily.   escitalopram (LEXAPRO) 10 MG tablet Take 0.5 tablets (5 mg total) by mouth daily.   famotidine (PEPCID) 20 MG tablet Take 1 tablet (20 mg total) by mouth 2 (two) times daily.   Garlic 300 MG CAPS Take 1 capsule by mouth daily.   magnesium gluconate (MAGONATE) 500 MG tablet Take 500 mg by mouth daily.   Multiple Vitamin (MULTIVITAMIN) capsule Take 1 capsule by mouth daily.   Omega-3 Fatty Acids (ULTRA OMEGA-3 FISH OIL PO) Take by mouth.   Polyethyl Glycol-Propyl Glycol (SYSTANE OP) Apply 1 drop to eye daily as needed (Dry eyes).   aspirin EC 81 MG tablet Take 1 tablet (81 mg total) by mouth daily. (Patient not taking: Reported on 03/12/2023)   esomeprazole (NEXIUM) 20 MG capsule Take 1 capsule (20 mg total) by mouth daily at 12 noon. (Patient not taking: Reported on 03/12/2023)   KRILL OIL PO Take by mouth. Takes 1 tab occasionally (Patient not taking: Reported on 03/12/2023)   No facility-administered encounter medications on file as of 03/12/2023.    Allergies (verified) Amoxicillin, Codeine, Macrobid [nitrofurantoin], Clarithromycin, and Trazodone and nefazodone   History: Past Medical History:  Diagnosis Date   Acute meniscal tear of knee    Arthritis    "neck; left  knee before replacement" (05/10/2015)   Basal cell cancer 02/2007   Breast cyst    "left"   Chest pain 06/2010   [facility]) and palpitations - ruled out for MI with nl. Echo   Complication of anesthesia HARD TO WAKE   Difficulty sleeping    Eczema    GERD (gastroesophageal reflux disease)    Heart palpitations OCCASIONAL--  TAKES ATENOLOL PRN   Hiatal hernia    HSV-2 infection    buttocks   Hyperlipidemia DIET CONTROL   Melanoma of skin (HCC)    MVP (mitral valve prolapse)    per pt no treatment needed   Normal nuclear stress test 07/09/2010   Osteopenia 12/2013   T score -1.6 FRAX 16%/0.8%   PONV (postoperative nausea and vomiting)    Sleep disorder  09/12/2017   Past Surgical History:  Procedure Laterality Date   Carotid US  9/05   Mild, recheck 1 year   Carotid US  10/06   Normal   CHONDROPLASTY  10/23/2011   Procedure: CHONDROPLASTY;  Surgeon: Loanne Drilling, MD;  Location: [facility] [place];  Service: Orthopedics;  Laterality: Left;  Left medial    COLONOSCOPY     ESOPHAGOGASTRODUODENOSCOPY  9/07   Normal   HYSTEROSCOPY WITH RESECTOSCOPE  2000   POLPECTOMY'S   JOINT REPLACEMENT     KNEE ARTHROSCOPY  10/23/2011   Procedure: ARTHROSCOPY KNEE;  Surgeon: Loanne Drilling, MD;  Location: [facility];  Service: Orthopedics;  Laterality: Left;  LEFT KNEE SCOPE WITH DEBRIDEMENT    MELANOMA EXCISION Right 1995   "side"   MOHS SURGERY Left    "side of my nose"   MOLE REMOVAL     "several; all over my body"   MRI neck  3/00   C4-C5 herniation small stenosis mild C4-5, C5-6   TONSILLECTOMY     TOTAL KNEE ARTHROPLASTY Left 11/21/2014   Procedure: LEFT TOTAL KNEE ARTHROPLASTY;  Surgeon: Loanne Drilling, MD;  Location: [facility];  Service: Orthopedics;  Laterality: Left;   TRANSTHORACIC ECHOCARDIOGRAM  06-29-2010   NORMAL LVSF, EF 55-60%,  MILD MR   TUBAL LIGATION  1989   VAGINAL HYSTERECTOMY  2001   complex hyperplasia with mother's history of uterine cancer   Family History  Problem Relation Age of Onset   Cancer Mother        uterine   Obesity Mother    Thrombocytopenia Mother    Diabetes Mother    Osteoarthritis Mother    Alcohol abuse Father    Heart disease Father        CAD in 32's   Emphysema Father    Diabetes Maternal Grandmother        type 2   Heart disease Paternal Aunt        CAD   Heart disease Paternal Uncle        CAD   Osteoarthritis Sister    Osteoarthritis Maternal Aunt    Colon cancer Neg Hx    Breast cancer Neg Hx    Social History   Socioeconomic History   Marital status: Married    Spouse name: Not on file   Number of children: Not on file   Years of education: Not on  file   Highest education level: Not on file  Occupational History   Not on file  Tobacco Use   Smoking status: Former    Packs/day: 1.00    Years: 15.00    Additional pack years:  0.00    Total pack years: 15.00    Types: Cigarettes    Quit date: 10/07/1978    Years since quitting: 44.4   Smokeless tobacco: Never  Vaping Use   Vaping Use: Never used  Substance and Sexual Activity   Alcohol use: No    Alcohol/week: 0.0 standard drinks of alcohol   Drug use: No   Sexual activity: Yes    Birth control/protection: Post-menopausal, Surgical    Comment: HYST-1st intercourse 72 yo-Fewer than 5 partners  Other Topics Concern   Not on file  Social History Narrative   Daily caffeine use.   Patient gets regular exercise.   Social Determinants of Health   Financial Resource Strain: Low Risk  (03/11/2022)   Overall Financial Resource Strain (CARDIA)    Difficulty of Paying Living Expenses: Not hard at all  Food Insecurity: No Food Insecurity (03/12/2023)   Hunger Vital Sign    Worried About Running Out of Food in the Last Year: Never true    Ran Out of Food in the Last Year: Never true  Transportation Needs: No Transportation Needs (03/11/2022)   PRAPARE - Administrator, Civil Service (Medical): No    Lack of Transportation (Non-Medical): No  Physical Activity: Insufficiently Active (03/12/2023)   Exercise Vital Sign    Days of Exercise per Week: 2 days    Minutes of Exercise per Session: 30 min  Stress: No Stress Concern Present (03/11/2022)   [employer] of Occupational Health - Occupational Stress Questionnaire    Feeling of Stress : Not at all  Social Connections: Moderately Integrated (03/12/2023)   Social Connection and Isolation Panel [NHANES]    Frequency of Communication with Friends and Family: More than three times a week    Frequency of Social Gatherings with Friends and Family: More than three times a week    Attends Religious Services: More than 4 times per  year    Active Member of [employer] or Organizations: No    Attends Engineer, structural: Never    Marital Status: Married    Tobacco Counseling Counseling given: Not Answered   Clinical Intake:  Pre-visit preparation completed: Yes  Pain : 0-10 Pain Score: 5  Pain Type: Acute pain Pain Location: Knee Pain Descriptors / Indicators: Aching, Radiating Pain Onset: More than a month ago     Nutritional Risks: None Diabetes: No  How often do you need to have someone help you when you read instructions, pamphlets, or other written materials from your doctor or pharmacy?: 1 - Never  Diabetic? NO  Interpreter Needed?: No  Information entered by :: C.Taiten Brawn LPN   Activities of Daily Living    03/12/2023    2:46 PM  In your present state of health, do you have any difficulty performing the following activities:  Hearing? 1  Comment right ear  Vision? 0  Difficulty concentrating or making decisions? 1  Comment occasionally  Walking or climbing stairs? 0  Dressing or bathing? 0  Doing errands, shopping? 0  Preparing Food and eating ? N  Using the Toilet? N  In the past six months, have you accidently leaked urine? N  Do you have problems with loss of bowel control? N  Managing your Medications? N  Managing your Finances? N  Housekeeping or managing your Housekeeping? N    Patient Care Team: Tower, Audrie Gallus, MD as PCP - General  Indicate any recent Medical Services you may have received from other than  [facility] providers in the past year (date may be approximate).     Assessment:   This is a routine wellness examination for [name] [name].  Hearing/Vision screen Hearing Screening - Comments:: Slight hearing loss in right ear - no aids Vision Screening - Comments:: Contacts - [facility]  Dietary issues and exercise activities discussed: Current Exercise Habits: Home exercise routine, Type of exercise: walking;Other - see comments (bike), Time (Minutes): 30, Frequency  (Times/Week): 3, Weekly Exercise (Minutes/Week): 90, Intensity: Mild, Exercise limited by: None identified   Goals Addressed             This Visit's Progress    Patient Stated       Exercise more       Depression Screen    03/12/2023    2:44 PM 11/18/2022   12:03 PM 03/11/2022   11:06 AM 01/24/2021   12:05 PM 01/19/2020   12:06 PM 12/24/2018   12:05 PM 12/19/2017   11:40 AM  PHQ 2/9 Scores  PHQ - 2 Score 0 0 0 0 0 0 0  PHQ- 9 Score  6  0 0      Fall Risk    03/12/2023    2:46 PM 11/18/2022   12:03 PM 04/22/2022    3:32 PM 03/11/2022   11:06 AM 01/24/2021   12:05 PM  Fall Risk   Falls in the past year? 0 0 0 0 0  Number falls in past yr: 0 0  0 0  Injury with Fall? 0 0  0 0  Risk for fall due to : No Fall Risks No Fall Risks  Medication side effect Medication side effect  Follow up Falls prevention discussed;Falls evaluation completed Falls evaluation completed Falls evaluation completed Falls evaluation completed;Education provided;Falls prevention discussed Falls evaluation completed;Falls prevention discussed    FALL RISK PREVENTION PERTAINING TO THE HOME:  Any stairs in or around the home? No  If so, are there any without handrails? No  Home free of loose throw rugs in walkways, pet beds, electrical cords, etc? Yes  Adequate lighting in your home to reduce risk of falls? Yes   ASSISTIVE DEVICES UTILIZED TO PREVENT FALLS:  Life alert? No  Use of a cane, walker or w/c? No  Grab bars in the bathroom? Yes  Shower chair or bench in shower? No  Elevated toilet seat or a handicapped toilet? Yes    Cognitive Function:    01/24/2021   12:06 PM 01/19/2020   12:09 PM  MMSE - Mini Mental State Exam  Not completed: Refused   Orientation to time  5  Orientation to Place  5  Registration  3  Attention/ Calculation  5  Recall  3  Language- repeat  1        03/12/2023    2:47 PM 03/11/2022   11:08 AM  6CIT Screen  What Year? 0 points 0 points  What month? 0 points 0  points  What time? 0 points 0 points  Count back from 20 0 points 0 points  Months in reverse 0 points 0 points  Repeat phrase 0 points 0 points  Total Score 0 points 0 points    Immunizations Immunization History  Administered Date(s) Administered   Influenza Split 10/12/2012   Influenza,inj,Quad PF,6+ Mos 07/22/2014, 10/06/2015   Pneumococcal Conjugate-13 12/19/2017   Pneumococcal Polysaccharide-23 12/24/2018   Td 01/27/2006   Tdap 12/18/2016   Zoster, Live 11/29/2013    TDAP status: Up to date  Flu Vaccine status:  Declined, Education has been provided regarding the importance of this vaccine but patient still declined. Advised may receive this vaccine at local pharmacy or Health Dept. Aware to provide a copy of the vaccination record if obtained from local pharmacy or Health Dept. Verbalized acceptance and understanding.  Pneumococcal vaccine status: Up to date  Covid-19 vaccine status: Declined, Education has been provided regarding the importance of this vaccine but patient still declined. Advised may receive this vaccine at local pharmacy or Health Dept.or vaccine clinic. Aware to provide a copy of the vaccination record if obtained from local pharmacy or Health Dept. Verbalized acceptance and understanding.  Qualifies for Shingles Vaccine? Yes   Zostavax completed Yes   Shingrix Completed?: No.    Education has been provided regarding the importance of this vaccine. Patient has been advised to call insurance company to determine out of pocket expense if they have not yet received this vaccine. Advised may also receive vaccine at local pharmacy or Health Dept. Verbalized acceptance and understanding.  Screening Tests Health Maintenance  Topic Date Due   COVID-19 Vaccine (1) Never done   Zoster Vaccines- Shingrix (1 of 2) Never done   INFLUENZA VACCINE  05/08/2023   MAMMOGRAM  02/06/2024   Medicare Annual Wellness (AWV)  03/11/2024   Colonoscopy  05/28/2025    DTaP/Tdap/Td (3 - Td or Tdap) 12/19/2026   Pneumonia Vaccine 33+ Years old  Completed   DEXA SCAN  Completed   Hepatitis C Screening  Completed   HPV VACCINES  Aged Out    Health Maintenance  Health Maintenance Due  Topic Date Due   COVID-19 Vaccine (1) Never done   Zoster Vaccines- Shingrix (1 of 2) Never done    Colorectal cancer screening: Type of screening: Colonoscopy. Completed 05/29/15. Repeat every 10 years  Mammogram status: Completed 02/05/22. Repeat every year next mammogram scheduled for 04/01/23.  Bone Scan - Declined at this time, will discuss with OB/GYN  Lung Cancer Screening: (Low Dose CT Chest recommended if Age 42-80 years, 30 pack-year currently smoking OR have quit w/in 15years.) does not qualify.   Lung Cancer Screening Referral: no  Additional Screening:  Hepatitis C Screening: does qualify; Completed 09/28/15  Vision Screening: Recommended annual ophthalmology exams for early detection of glaucoma and other disorders of the eye. Is the patient up to date with their annual eye exam?  Yes  Who is the provider or what is the name of the office in which the patient attends annual eye exams? [facility] If pt is not established with a provider, would they like to be referred to a provider to establish care? Yes .   Dental Screening: Recommended annual dental exams for proper oral hygiene  Community Resource Referral / Chronic Care Management: CRR required this visit?  No   CCM required this visit?  No      Plan:     I have personally reviewed and noted the following in the patient's chart:   Medical and social history Use of alcohol, tobacco or illicit drugs  Current medications and supplements including opioid prescriptions. Patient is not currently taking opioid prescriptions. Functional ability and status Nutritional status Physical activity Advanced directives List of other physicians Hospitalizations, surgeries, and ER visits in  previous 12 months Vitals Screenings to include cognitive, depression, and falls Referrals and appointments  In addition, I have reviewed and discussed with patient certain preventive protocols, quality metrics, and best practice recommendations. A written personalized care plan for preventive services as well  as general preventive health recommendations were provided to patient.     Maryan Puls, LPN   10/12/1094   Nurse Notes: none

## 2023-03-13 ENCOUNTER — Telehealth: Payer: Self-pay | Admitting: Family Medicine

## 2023-03-13 DIAGNOSIS — R5382 Chronic fatigue, unspecified: Secondary | ICD-10-CM

## 2023-03-13 DIAGNOSIS — T471X5A Adverse effect of other antacids and anti-gastric-secretion drugs, initial encounter: Secondary | ICD-10-CM

## 2023-03-13 DIAGNOSIS — E7800 Pure hypercholesterolemia, unspecified: Secondary | ICD-10-CM

## 2023-03-13 DIAGNOSIS — Z0000 Encounter for general adult medical examination without abnormal findings: Secondary | ICD-10-CM

## 2023-03-13 DIAGNOSIS — R002 Palpitations: Secondary | ICD-10-CM

## 2023-03-13 DIAGNOSIS — E559 Vitamin D deficiency, unspecified: Secondary | ICD-10-CM

## 2023-03-13 NOTE — Telephone Encounter (Signed)
-----   Message from Alvina Chou sent at 02/24/2023  3:46 PM EDT ----- Regarding: Lab orders for Friday, 6.7.24 Patient is scheduled for CPX labs, please order future labs, Thanks , Camelia Eng

## 2023-03-14 ENCOUNTER — Ambulatory Visit
Admission: RE | Admit: 2023-03-14 | Discharge: 2023-03-14 | Disposition: A | Discharge status: Home / Self Care | Payer: PPO | Source: Ambulatory Visit | Attending: Family Medicine | Admitting: Family Medicine

## 2023-03-14 ENCOUNTER — Encounter: Payer: Self-pay | Admitting: Family Medicine

## 2023-03-14 ENCOUNTER — Other Ambulatory Visit (INDEPENDENT_AMBULATORY_CARE_PROVIDER_SITE_OTHER): Payer: PPO

## 2023-03-14 ENCOUNTER — Ambulatory Visit (INDEPENDENT_AMBULATORY_CARE_PROVIDER_SITE_OTHER): Payer: PPO | Admitting: Family Medicine

## 2023-03-14 ENCOUNTER — Ambulatory Visit: Payer: PPO

## 2023-03-14 VITALS — BP 122/80 | HR 72 | Temp 97.6°F | Ht 65.0 in | Wt 181.0 lb

## 2023-03-14 DIAGNOSIS — T471X5A Adverse effect of other antacids and anti-gastric-secretion drugs, initial encounter: Secondary | ICD-10-CM | POA: Diagnosis not present

## 2023-03-14 DIAGNOSIS — M79604 Pain in right leg: Secondary | ICD-10-CM | POA: Diagnosis not present

## 2023-03-14 DIAGNOSIS — R002 Palpitations: Secondary | ICD-10-CM

## 2023-03-14 DIAGNOSIS — E7800 Pure hypercholesterolemia, unspecified: Secondary | ICD-10-CM | POA: Diagnosis not present

## 2023-03-14 DIAGNOSIS — R5382 Chronic fatigue, unspecified: Secondary | ICD-10-CM | POA: Diagnosis not present

## 2023-03-14 DIAGNOSIS — M25561 Pain in right knee: Secondary | ICD-10-CM | POA: Diagnosis not present

## 2023-03-14 DIAGNOSIS — E559 Vitamin D deficiency, unspecified: Secondary | ICD-10-CM | POA: Diagnosis not present

## 2023-03-14 LAB — CBC WITH DIFFERENTIAL/PLATELET
Basophils Absolute: 0 10*3/uL (ref 0.0–0.1)
Basophils Relative: 0.4 % (ref 0.0–3.0)
Eosinophils Absolute: 0.1 10*3/uL (ref 0.0–0.7)
Eosinophils Relative: 1.4 % (ref 0.0–5.0)
HCT: 43.7 % (ref 36.0–46.0)
Hemoglobin: 14.7 g/dL (ref 12.0–15.0)
Lymphocytes Relative: 31.1 % (ref 12.0–46.0)
Lymphs Abs: 1.6 10*3/uL (ref 0.7–4.0)
MCHC: 33.7 g/dL (ref 30.0–36.0)
MCV: 96.3 fl (ref 78.0–100.0)
Monocytes Absolute: 0.3 10*3/uL (ref 0.1–1.0)
Monocytes Relative: 6.6 % (ref 3.0–12.0)
Neutro Abs: 3.2 10*3/uL (ref 1.4–7.7)
Neutrophils Relative %: 60.5 % (ref 43.0–77.0)
Platelets: 208 10*3/uL (ref 150.0–400.0)
RBC: 4.54 Mil/uL (ref 3.87–5.11)
RDW: 13.8 % (ref 11.5–15.5)
WBC: 5.3 10*3/uL (ref 4.0–10.5)

## 2023-03-14 LAB — COMPREHENSIVE METABOLIC PANEL
ALT: 26 U/L (ref 0–35)
AST: 28 U/L (ref 0–37)
Albumin: 4.2 g/dL (ref 3.5–5.2)
Alkaline Phosphatase: 79 U/L (ref 39–117)
BUN: 19 mg/dL (ref 6–23)
CO2: 27 mEq/L (ref 19–32)
Calcium: 9.5 mg/dL (ref 8.4–10.5)
Chloride: 104 mEq/L (ref 96–112)
Creatinine, Ser: 0.77 mg/dL (ref 0.40–1.20)
GFR: 77.86 mL/min (ref >60.00)
Glucose, Bld: 79 mg/dL (ref 70–99)
Potassium: 4 mEq/L (ref 3.5–5.1)
Sodium: 139 mEq/L (ref 135–145)
Total Bilirubin: 0.8 mg/dL (ref 0.2–1.2)
Total Protein: 6.6 g/dL (ref 6.0–8.3)

## 2023-03-14 LAB — VITAMIN D 25 HYDROXY (VIT D DEFICIENCY, FRACTURES): VITD: 51.58 ng/mL (ref 30.00–100.00)

## 2023-03-14 LAB — LIPID PANEL
Cholesterol: 186 mg/dL (ref 0–200)
HDL: 49.2 mg/dL (ref >39.00)
LDL Cholesterol: 107 mg/dL — ABNORMAL HIGH (ref 0–99)
NonHDL: 136.98
Total CHOL/HDL Ratio: 4
Triglycerides: 152 mg/dL — ABNORMAL HIGH (ref 0.0–149.0)
VLDL: 30.4 mg/dL (ref 0.0–40.0)

## 2023-03-14 LAB — VITAMIN B12: Vitamin B-12: 775 pg/mL (ref 211–911)

## 2023-03-14 LAB — TSH: TSH: 2.75 u[IU]/mL (ref 0.35–5.50)

## 2023-03-14 NOTE — Patient Instructions (Signed)
Nice to meet you. You can [facility] to the medical mall to hospital to have your ultrasound completed.  You need to arrive there by 4:30 PM.  You can also have your x-ray done there as well.

## 2023-03-14 NOTE — Progress Notes (Signed)
Marikay Alar, MD Phone: [phone]  [name] [name] is a 72 y.o. female who presents today for same-day visit.  Right leg/knee pain: This has been going on a little over a week.  She has pain in her posterior right knee which goes down into her right calf.  She does note some tenderness in her calf.  She does note her knee has been popping.  She notes no injury.  She has no history of blood clot.  No recent travel or surgeries.  Social History   Tobacco Use  Smoking Status Former   Packs/day: 1.00   Years: 15.00   Additional pack years: 0.00   Total pack years: 15.00   Types: Cigarettes   Quit date: 10/07/1978   Years since quitting: 44.4  Smokeless Tobacco Never    Current Outpatient Medications on File Prior to Visit  Medication Sig Dispense Refill   acyclovir (ZOVIRAX) 200 MG capsule TAKE 1 CAPSULE (200 MG TOTAL) BY MOUTH DAILY AS NEEDED. 90 capsule 1   acyclovir cream (ZOVIRAX) 5 % Apply 1 application topically daily as needed. 5 g 5   Ascorbic Acid (VITAMIN C PO) Take 1 tablet by mouth daily.     aspirin EC 81 MG tablet Take 1 tablet (81 mg total) by mouth daily.     atenolol (TENORMIN) 25 MG tablet Take 0.5 tablets (12.5 mg total) by mouth daily as needed (palpatations). 30 tablet 5   CALCIUM CARBONATE PO Take 1 tablet by mouth daily.     Cholecalciferol (VITAMIN D PO) Take 1 tablet by mouth daily.     CINNAMON PO Take 1 capsule by mouth. Takes occasionally     CYANOCOBALAMIN PO Take 1 tablet by mouth daily.     diclofenac sodium (VOLTAREN) 1 % GEL APPLY 4 GRAMS TO THE AFFECTED AREA 3 TIMES DAILY AS NEEDED 100 g 3   ELDERBERRY PO Take 1 tablet by mouth daily.     escitalopram (LEXAPRO) 10 MG tablet Take 0.5 tablets (5 mg total) by mouth daily. 45 tablet 3   esomeprazole (NEXIUM) 20 MG capsule Take 1 capsule (20 mg total) by mouth daily at 12 noon. 1 capsule 0   famotidine (PEPCID) 20 MG tablet Take 1 tablet (20 mg total) by mouth 2 (two) times daily. 180 tablet 3    Garlic 300 MG CAPS Take 1 capsule by mouth daily.     KRILL OIL PO Take by mouth. Takes 1 tab occasionally     magnesium gluconate (MAGONATE) 500 MG tablet Take 500 mg by mouth daily.     Multiple Vitamin (MULTIVITAMIN) capsule Take 1 capsule by mouth daily.     Omega-3 Fatty Acids (ULTRA OMEGA-3 FISH OIL PO) Take by mouth.     Polyethyl Glycol-Propyl Glycol (SYSTANE OP) Apply 1 drop to eye daily as needed (Dry eyes).     No current facility-administered medications on file prior to visit.     ROS see history of present illness  Objective  Physical Exam Vitals:   03/14/23 1355  BP: 122/80  Pulse: 72  Temp: 97.6 F (36.4 C)  SpO2: 97%    BP Readings from Last 3 Encounters:  03/14/23 122/80  11/18/22 119/75  04/22/22 110/72   Wt Readings from Last 3 Encounters:  03/14/23 181 lb (82.1 kg)  03/12/23 175 lb (79.4 kg)  11/18/22 177 lb 6 oz (80.5 kg)    Physical Exam Musculoskeletal:     Comments: Bilateral calves 42 cm, right knee with slight  tenderness anteriorly, no warmth, erythema, or swelling, slight tenderness in the right popliteal fossa and in the right calf, some discomfort in right calf on Homans testing      Assessment/Plan: Please see individual problem list.  Right leg pain Assessment & Plan: Patient with recent onset right knee and lower leg pain.  No known injury.  We will get an ultrasound to rule out DVT.  Also getting an x-ray to evaluate the structure of her knee.  Advised if she had a DVT we will contact her this evening after her ultrasound was completed.  Discussed if her x-ray revealed a fracture we will contact her this evening.  Otherwise we will contact her next week with results.  We discussed pain control with Tylenol over-the-counter.  I also offered to prescribe tramadol though the patient defers this at this time.  Discussed the potential for seeing orthopedics depending on the results of her evaluation.  Orders: -     US Venous Img Lower  Unilateral Right (DVT); Future -     DG Knee Complete 4 Views Right; Future    Return if symptoms worsen or fail to improve.   Marikay Alar, MD [facility]

## 2023-03-14 NOTE — Assessment & Plan Note (Signed)
Patient with recent onset right knee and lower leg pain.  No known injury.  We will get an ultrasound to rule out DVT.  Also getting an x-ray to evaluate the structure of her knee.  Advised if she had a DVT we will contact her this evening after her ultrasound was completed.  Discussed if her x-ray revealed a fracture we will contact her this evening.  Otherwise we will contact her next week with results.  We discussed pain control with Tylenol over-the-counter.  I also offered to prescribe tramadol though the patient defers this at this time.  Discussed the potential for seeing orthopedics depending on the results of her evaluation.

## 2023-03-19 ENCOUNTER — Encounter: Payer: Self-pay | Admitting: *Deleted

## 2023-03-19 ENCOUNTER — Telehealth: Payer: Self-pay

## 2023-03-19 DIAGNOSIS — M7121 Synovial cyst of popliteal space [Baker], right knee: Secondary | ICD-10-CM

## 2023-03-19 NOTE — Telephone Encounter (Signed)
Left message to call the office back regarding Dr. Sonnenberg's message 

## 2023-03-19 NOTE — Telephone Encounter (Signed)
-----   Message from Glori Luis, MD sent at 03/15/2023 11:58 AM EDT ----- Please contact the patient to see if she got the mychart result note and see if she wants me to place a referral to ortho for her.

## 2023-03-21 ENCOUNTER — Encounter: Payer: Self-pay | Admitting: Family Medicine

## 2023-03-21 ENCOUNTER — Ambulatory Visit (INDEPENDENT_AMBULATORY_CARE_PROVIDER_SITE_OTHER): Payer: PPO | Admitting: Family Medicine

## 2023-03-21 VITALS — BP 110/60 | HR 65 | Temp 97.2°F | Ht 64.25 in | Wt 180.0 lb

## 2023-03-21 DIAGNOSIS — E7800 Pure hypercholesterolemia, unspecified: Secondary | ICD-10-CM

## 2023-03-21 DIAGNOSIS — F419 Anxiety disorder, unspecified: Secondary | ICD-10-CM | POA: Diagnosis not present

## 2023-03-21 DIAGNOSIS — M79604 Pain in right leg: Secondary | ICD-10-CM

## 2023-03-21 DIAGNOSIS — R002 Palpitations: Secondary | ICD-10-CM

## 2023-03-21 DIAGNOSIS — M8589 Other specified disorders of bone density and structure, multiple sites: Secondary | ICD-10-CM | POA: Diagnosis not present

## 2023-03-21 DIAGNOSIS — T471X5A Adverse effect of other antacids and anti-gastric-secretion drugs, initial encounter: Secondary | ICD-10-CM | POA: Diagnosis not present

## 2023-03-21 DIAGNOSIS — K219 Gastro-esophageal reflux disease without esophagitis: Secondary | ICD-10-CM | POA: Diagnosis not present

## 2023-03-21 DIAGNOSIS — E559 Vitamin D deficiency, unspecified: Secondary | ICD-10-CM | POA: Diagnosis not present

## 2023-03-21 DIAGNOSIS — Z1211 Encounter for screening for malignant neoplasm of colon: Secondary | ICD-10-CM

## 2023-03-21 DIAGNOSIS — Z0000 Encounter for general adult medical examination without abnormal findings: Secondary | ICD-10-CM

## 2023-03-21 MED ORDER — ESCITALOPRAM OXALATE 10 MG PO TABS: 5.0000 mg | ORAL_TABLET | Freq: Every day | ORAL | 3 refills | Status: DC

## 2023-03-21 NOTE — Assessment & Plan Note (Signed)
Colonoscopy 05/2015

## 2023-03-21 NOTE — Assessment & Plan Note (Signed)
Dexa reviewed 2021 No falls or fracture  On ca and D  Last vitamin D Lab Results  Component Value Date   VD25OH 51.58 03/14/2023   Encouraged more strength building exercise   Plans to get next dexa at gyn

## 2023-03-21 NOTE — Assessment & Plan Note (Signed)
Has atenolol for prn use

## 2023-03-21 NOTE — Assessment & Plan Note (Signed)
Reviewed health habits including diet and exercise and skin cancer prevention Reviewed appropriate screening tests for age  Also reviewed health mt list, fam hx and immunization status , as well as social and family history   See HPI Labs reviewed and ordered Interested in shingrix-discussed getting this at pharmacy  Mammogram scheduled 7/2  Planning routine gyn provider follow up, is overdue  Dermatology care utd/uses sun protection Colonoscopy 05/2015  Dexa is due-pt plans to get at gyn     no falls or fracture, akes ca and D , encouraged strength building exercise PHQ score of 0

## 2023-03-21 NOTE — Assessment & Plan Note (Signed)
Vitamin D level is therapeutic with current supplementation Disc importance of this to bone and overall health Last vitamin D Lab Results  Component Value Date   VD25OH 51.58 03/14/2023   Takes 2000 international units daily

## 2023-03-21 NOTE — Assessment & Plan Note (Signed)
Planning ortho visit

## 2023-03-21 NOTE — Assessment & Plan Note (Signed)
Nexium prn and pepcid and diet control  B12 and D levels are in normal range

## 2023-03-21 NOTE — Telephone Encounter (Signed)
Pt called and I read the message to her and she stated she has an ortho in [place]

## 2023-03-21 NOTE — Assessment & Plan Note (Signed)
Lab Results  Component Value Date   VITAMINB12 775 03/14/2023   Last vitamin D Lab Results  Component Value Date   VD25OH 51.58 03/14/2023

## 2023-03-21 NOTE — Assessment & Plan Note (Signed)
Disc goals for lipids and reasons to control them Rev last labs with pt Rev low sat fat diet in detail LDL down to 107 Trig 152 Encouraged exercise for HDL

## 2023-03-21 NOTE — Progress Notes (Signed)
Subjective:    Patient ID: [name] [name], female    DOB: 1951/03/05, 72 y.o.   MRN: [id number]  HPI  Here for health maintenance exam and to review chronic medical problems   Wt Readings from Last 3 Encounters:  03/21/23 180 lb (81.6 kg)  03/14/23 181 lb (82.1 kg)  03/12/23 175 lb (79.4 kg)   30.66 kg/m  Vitals:   03/21/23 1401  BP: 110/60  Pulse: 65  Temp: (!) 97.2 F (36.2 C)  SpO2: 95%    Has a little headache today  Immunization History  Administered Date(s) Administered   Influenza Split 10/12/2012   Influenza,inj,Quad PF,6+ Mos 07/22/2014, 10/06/2015   Pneumococcal Conjugate-13 12/19/2017   Pneumococcal Polysaccharide-23 12/24/2018   Td 01/27/2006   Tdap 12/18/2016   Zoster, Live 11/29/2013    Health Maintenance Due  Topic Date Due   Zoster Vaccines- Shingrix (1 of 2) Never done   Shintrix-interested   Mammogram 02/2022   has the next one scheduled on 7/2 Self breast exam: no lumps    Gyn care/ pap -due for follow up / Dr Minda Meo dermatology yearly at least  Utd care - lots of cryo treatments    Colon cancer screening -colonoscopy 05/2015   Dexa 11/2019 osteopenia  (at gyn ) May do one this year at gyn  Falls- none  Fractures-noe  Supplements  Last vitamin D Lab Results  Component Value Date   VD25OH 51.58 03/14/2023   Was 90 last time Takes 2000 international units daily    Exercise - uses a little pedaler and also walking     Mood Takes lexapro 5 mg daily    03/14/2023    1:57 PM 03/12/2023    2:44 PM 11/18/2022   12:03 PM 03/11/2022   11:06 AM 01/24/2021   12:05 PM  Depression screen PHQ 2/9  Decreased Interest 0 0 0 0 0  Down, Depressed, Hopeless 0 0 0 0 0  PHQ - 2 Score 0 0 0 0 0  Altered sleeping 0  3  0  Tired, decreased energy 0  3  0  Change in appetite 0  0  0  Feeling bad or failure about yourself  0  0  0  Trouble concentrating 0  0  0  Moving slowly or fidgety/restless 0  0  0  Suicidal thoughts 0  0  0   PHQ-9 Score 0  6  0  Difficult doing work/chores Not difficult at all  Not difficult at all  Not difficult at all     H/o palpitations  Takes prn atenolol 12.5 mg     Hyperlipidemia Lab Results  Component Value Date   CHOL 186 03/14/2023   CHOL 189 03/12/2022   CHOL 195 02/06/2021   Lab Results  Component Value Date   HDL 49.20 03/14/2023   HDL 58.40 03/12/2022   HDL 53.90 02/06/2021   Lab Results  Component Value Date   LDLCALC 107 (H) 03/14/2023   LDLCALC 115 (H) 03/12/2022   LDLCALC 117 (H) 02/06/2021   Lab Results  Component Value Date   TRIG 152.0 (H) 03/14/2023   TRIG 79.0 03/12/2022   TRIG 118.0 02/06/2021   Lab Results  Component Value Date   CHOLHDL 4 03/14/2023   CHOLHDL 3 03/12/2022   CHOLHDL 4 02/06/2021   Lab Results  Component Value Date   LDLDIRECT 124.4 09/12/2010   Diet controlled  Trigs are up a bit  GERD Nexium 20 mg prn and pepcid bid  Lab Results  Component Value Date   VITAMINB12 775 03/14/2023   Last vitamin D Lab Results  Component Value Date   VD25OH 51.58 03/14/2023     CMP     Component Value Date/Time   NA 139 03/14/2023 0918   K 4.0 03/14/2023 0918   CL 104 03/14/2023 0918   CO2 27 03/14/2023 0918   GLUCOSE 79 03/14/2023 0918   BUN 19 03/14/2023 0918   CREATININE 0.77 03/14/2023 0918   CREATININE 0.81 04/19/2015 1738   CALCIUM 9.5 03/14/2023 0918   PROT 6.6 03/14/2023 0918   ALBUMIN 4.2 03/14/2023 0918   AST 28 03/14/2023 0918   ALT 26 03/14/2023 0918   ALKPHOS 79 03/14/2023 0918   BILITOT 0.8 03/14/2023 0918   GFR 77.86 03/14/2023 0918   GFRNONAA >60 09/06/2017 1309   Lab Results  Component Value Date   WBC 5.3 03/14/2023   HGB 14.7 03/14/2023   HCT 43.7 03/14/2023   MCV 96.3 03/14/2023   PLT 208.0 03/14/2023   Lab Results  Component Value Date   TSH 2.75 03/14/2023      Patient Active Problem List   Diagnosis Date Noted   Right leg pain 03/14/2023   Adverse effect of proton pump  inhibitor 12/14/2021   Anxious mood 09/24/2017   Sleep disorder 09/12/2017   Palpitations 05/22/2015   OA (osteoarthritis) of knee 11/21/2014   Colon cancer screening 11/29/2013   Routine general medical examination at a health care facility 09/16/2011   DERMATITIS, ATOPIC 11/27/2010   URTICARIA DUE TO COLD OR HEAT 11/27/2010   EXTERNAL HEMORRHOIDS WITHOUT MENTION COMP 06/25/2010   ABNORMAL EKG 12/26/2009   OTHER CHRONIC SINUSITIS 10/19/2009   Vitamin D deficiency 04/21/2009   ALLERGIC RHINITIS 11/09/2008   GERD 07/28/2008   Osteopenia 03/14/2008   HYPERCHOLESTEROLEMIA, PURE 03/13/2007   HIATAL HERNIA 01/12/2007   Past Medical History:  Diagnosis Date   Acute meniscal tear of knee    Arthritis    "neck; left knee before replacement" (05/10/2015)   Basal cell cancer 02/2007   Breast cyst    "left"   Chest pain 06/2010   [facility]) and palpitations - ruled out for MI with nl. Echo   Complication of anesthesia HARD TO WAKE   Difficulty sleeping    Eczema    GERD (gastroesophageal reflux disease)    Heart palpitations OCCASIONAL--  TAKES ATENOLOL PRN   Hiatal hernia    HSV-2 infection    buttocks   Hyperlipidemia DIET CONTROL   Melanoma of skin (HCC)    MVP (mitral valve prolapse)    per pt no treatment needed   Normal nuclear stress test 07/09/2010   Osteopenia 12/2013   T score -1.6 FRAX 16%/0.8%   PONV (postoperative nausea and vomiting)    Sleep disorder 09/12/2017   Past Surgical History:  Procedure Laterality Date   Carotid US  9/05   Mild, recheck 1 year   Carotid US  10/06   Normal   CHONDROPLASTY  10/23/2011   Procedure: CHONDROPLASTY;  Surgeon: Loanne Drilling, MD;  Location: [facility];  Service: Orthopedics;  Laterality: Left;  Left medial    COLONOSCOPY     ESOPHAGOGASTRODUODENOSCOPY  9/07   Normal   HYSTEROSCOPY WITH RESECTOSCOPE  2000   POLPECTOMY'S   JOINT REPLACEMENT     KNEE ARTHROSCOPY  10/23/2011   Procedure: ARTHROSCOPY KNEE;   Surgeon: Loanne Drilling, MD;  Location:   SURGERY CENTER;  Service: Orthopedics;  Laterality: Left;  LEFT KNEE SCOPE WITH DEBRIDEMENT    MELANOMA EXCISION Right 1995   "side"   MOHS SURGERY Left    "side of my nose"   MOLE REMOVAL     "several; all over my body"   MRI neck  3/00   C4-C5 herniation small stenosis mild C4-5, C5-6   TONSILLECTOMY     TOTAL KNEE ARTHROPLASTY Left 11/21/2014   Procedure: LEFT TOTAL KNEE ARTHROPLASTY;  Surgeon: Loanne Drilling, MD;  Location: [facility];  Service: Orthopedics;  Laterality: Left;   TRANSTHORACIC ECHOCARDIOGRAM  06-29-2010   NORMAL LVSF, EF 55-60%,  MILD MR   TUBAL LIGATION  1989   VAGINAL HYSTERECTOMY  2001   complex hyperplasia with mother's history of uterine cancer   Social History   Tobacco Use   Smoking status: Former    Packs/day: 1.00    Years: 15.00    Additional pack years: 0.00    Total pack years: 15.00    Types: Cigarettes    Quit date: 10/07/1978    Years since quitting: 44.4    Passive exposure: Never   Smokeless tobacco: Never  Vaping Use   Vaping Use: Never used  Substance Use Topics   Alcohol use: No    Alcohol/week: 0.0 standard drinks of alcohol   Drug use: No   Family History  Problem Relation Age of Onset   Cancer Mother        uterine   Obesity Mother    Thrombocytopenia Mother    Diabetes Mother    Osteoarthritis Mother    Alcohol abuse Father    Heart disease Father        CAD in 2's   Emphysema Father    Diabetes Maternal Grandmother        type 2   Heart disease Paternal Aunt        CAD   Heart disease Paternal Uncle        CAD   Osteoarthritis Sister    Osteoarthritis Maternal Aunt    Colon cancer Neg Hx    Breast cancer Neg Hx    Allergies  Allergen Reactions   Amoxicillin Rash   Codeine Rash   Macrobid [Nitrofurantoin]     GI issues   Clarithromycin Other (See Comments)    REACTION: reaction not known   Trazodone And Nefazodone Palpitations   Current Outpatient  Medications on File Prior to Visit  Medication Sig Dispense Refill   acyclovir (ZOVIRAX) 200 MG capsule TAKE 1 CAPSULE (200 MG TOTAL) BY MOUTH DAILY AS NEEDED. 90 capsule 1   acyclovir cream (ZOVIRAX) 5 % Apply 1 application topically daily as needed. 5 g 5   Ascorbic Acid (VITAMIN C PO) Take 1 tablet by mouth daily.     aspirin EC 81 MG tablet Take 1 tablet (81 mg total) by mouth daily.     atenolol (TENORMIN) 25 MG tablet Take 0.5 tablets (12.5 mg total) by mouth daily as needed (palpatations). 30 tablet 5   CALCIUM CARBONATE PO Take 1 tablet by mouth daily.     Cholecalciferol (VITAMIN D PO) Take 1 tablet by mouth daily.     CINNAMON PO Take 1 capsule by mouth. Takes occasionally     CYANOCOBALAMIN PO Take 1 tablet by mouth daily.     diclofenac sodium (VOLTAREN) 1 % GEL APPLY 4 GRAMS TO THE AFFECTED AREA 3 TIMES DAILY AS NEEDED 100 g 3  ELDERBERRY PO Take 1 tablet by mouth daily.     esomeprazole (NEXIUM) 20 MG capsule Take 1 capsule (20 mg total) by mouth daily at 12 noon. 1 capsule 0   famotidine (PEPCID) 20 MG tablet Take 1 tablet (20 mg total) by mouth 2 (two) times daily. 180 tablet 3   Garlic 300 MG CAPS Take 1 capsule by mouth daily.     KRILL OIL PO Take by mouth. Takes 1 tab occasionally     magnesium gluconate (MAGONATE) 500 MG tablet Take 500 mg by mouth daily.     Multiple Vitamin (MULTIVITAMIN) capsule Take 1 capsule by mouth daily.     Omega-3 Fatty Acids (ULTRA OMEGA-3 FISH OIL PO) Take by mouth.     Polyethyl Glycol-Propyl Glycol (SYSTANE OP) Apply 1 drop to eye daily as needed (Dry eyes).     No current facility-administered medications on file prior to visit.    Review of Systems  Constitutional:  Negative for activity change, appetite change, fatigue, fever and unexpected weight change.  HENT:  Negative for congestion, ear pain, rhinorrhea, sinus pressure and sore throat.   Eyes:  Negative for pain, redness and visual disturbance.  Respiratory:  Negative for  cough, shortness of breath and wheezing.   Cardiovascular:  Negative for chest pain and palpitations.  Gastrointestinal:  Negative for abdominal pain, blood in stool, constipation and diarrhea.  Endocrine: Negative for polydipsia and polyuria.  Genitourinary:  Negative for dysuria, frequency and urgency.  Musculoskeletal:  Positive for arthralgias. Negative for back pain and myalgias.       Leg/knee pain   Skin:  Negative for pallor and rash.  Allergic/Immunologic: Negative for environmental allergies.  Neurological:  Negative for dizziness, syncope and headaches.  Hematological:  Negative for adenopathy. Does not bruise/bleed easily.  Psychiatric/Behavioral:  Negative for decreased concentration and dysphoric mood. The patient is not nervous/anxious.        Objective:   Physical Exam Constitutional:      General: She is not in acute distress.    Appearance: Normal appearance. She is well-developed and normal weight. She is not ill-appearing or diaphoretic.  HENT:     Head: Normocephalic and atraumatic.     Right Ear: Tympanic membrane, ear canal and external ear normal.     Left Ear: Tympanic membrane, ear canal and external ear normal.     Nose: Nose normal. No congestion.     Mouth/Throat:     Mouth: Mucous membranes are moist.     Pharynx: Oropharynx is clear. No posterior oropharyngeal erythema.  Eyes:     General: No scleral icterus.    Extraocular Movements: Extraocular movements intact.     Conjunctiva/sclera: Conjunctivae normal.     Pupils: Pupils are equal, round, and reactive to light.  Neck:     Thyroid: No thyromegaly.     Vascular: No carotid bruit or JVD.  Cardiovascular:     Rate and Rhythm: Normal rate and regular rhythm.     Pulses: Normal pulses.     Heart sounds: Normal heart sounds.     No gallop.  Pulmonary:     Effort: Pulmonary effort is normal. No respiratory distress.     Breath sounds: Normal breath sounds. No wheezing.     Comments: Good air  exch Chest:     Chest wall: No tenderness.  Abdominal:     General: Bowel sounds are normal. There is no distension or abdominal bruit.     Palpations: Abdomen is  soft. There is no mass.     Tenderness: There is no abdominal tenderness.     Hernia: No hernia is present.  Genitourinary:    Comments: Breast exam: No mass, nodules, thickening, tenderness, bulging, retraction, inflamation, nipple discharge or skin changes noted.  No axillary or clavicular LA.     Musculoskeletal:        General: No tenderness. Normal range of motion.     Cervical back: Normal range of motion and neck supple. No rigidity. No muscular tenderness.     Right lower leg: No edema.     Left lower leg: No edema.     Comments: No kyphosis   Lymphadenopathy:     Cervical: No cervical adenopathy.  Skin:    General: Skin is warm and dry.     Coloration: Skin is not pale.     Findings: No erythema or rash.     Comments: Solar lentigines diffusely Some sks and recently treated areas on hands/arms  Neurological:     Mental Status: She is alert. Mental status is at baseline.     Cranial Nerves: No cranial nerve deficit.     Motor: No abnormal muscle tone.     Coordination: Coordination normal.     Gait: Gait normal.     Deep Tendon Reflexes: Reflexes are normal and symmetric. Reflexes normal.  Psychiatric:        Mood and Affect: Mood normal.        Cognition and Memory: Cognition and memory normal.           Assessment & Plan:   Problem List Items Addressed This Visit       Digestive   GERD    Nexium prn and pepcid and diet control  B12 and D levels are in normal range        Musculoskeletal and Integument   Osteopenia    Dexa reviewed 2021 No falls or fracture  On ca and D  Last vitamin D Lab Results  Component Value Date   VD25OH 51.58 03/14/2023  Encouraged more strength building exercise   Plans to get next dexa at gyn         Other   Vitamin D deficiency    Vitamin D level is  therapeutic with current supplementation Disc importance of this to bone and overall health Last vitamin D Lab Results  Component Value Date   VD25OH 51.58 03/14/2023  Takes 2000 international units daily      Routine general medical examination at a health care facility - Primary    Reviewed health habits including diet and exercise and skin cancer prevention Reviewed appropriate screening tests for age  Also reviewed health mt list, fam hx and immunization status , as well as social and family history   See HPI Labs reviewed and ordered Interested in shingrix-discussed getting this at pharmacy  Mammogram scheduled 7/2  Planning routine gyn provider follow up, is overdue  Dermatology care utd/uses sun protection Colonoscopy 05/2015  Dexa is due-pt plans to get at gyn     no falls or fracture, akes ca and D , encouraged strength building exercise PHQ score of 0       Right leg pain    Planning ortho visit       Palpitations    Has atenolol for prn use       HYPERCHOLESTEROLEMIA, PURE    Disc goals for lipids and reasons to control them Rev last labs with pt  Rev low sat fat diet in detail LDL down to 107 Trig 152 Encouraged exercise for HDL      Colon cancer screening    Colonoscopy 05/2015      Anxious mood    Doing well with lexapro 5  PHQ 0 Wants to continue Encouraged good self care       Relevant Medications   escitalopram (LEXAPRO) 10 MG tablet   Adverse effect of proton pump inhibitor    Lab Results  Component Value Date   VITAMINB12 775 03/14/2023  Last vitamin D Lab Results  Component Value Date   VD25OH 51.58 03/14/2023

## 2023-03-21 NOTE — Assessment & Plan Note (Signed)
Doing well with lexapro 5  PHQ 0 Wants to continue Encouraged good self care

## 2023-03-21 NOTE — Patient Instructions (Addendum)
If you are interested in the new shingles vaccine (Shingrix) - call your local pharmacy to check on coverage and availability  If affordable, get on a wait list at your pharmacy to get the vaccine.    Add some strength training to your routine, this is important for bone and brain health and can reduce your risk of falls and help your body use insulin properly and regulate weight  Light weights, exercise bands , and internet videos are a good way to start  Yoga (chair or regular), machines , floor exercises or a gym with machines are also good options   For cholesterol Avoid red meat/ fried foods/ egg yolks/ fatty breakfast meats/ butter, cheese and high fat dairy/ and shellfish

## 2023-03-27 ENCOUNTER — Ambulatory Visit: Payer: PPO | Admitting: Cardiology

## 2023-03-27 NOTE — Telephone Encounter (Signed)
Noted. Referral placed to [facility] in [place].

## 2023-03-29 ENCOUNTER — Other Ambulatory Visit: Payer: Self-pay | Admitting: Family Medicine

## 2023-04-01 ENCOUNTER — Ambulatory Visit: Payer: PPO

## 2023-04-04 NOTE — Progress Notes (Deleted)
GYNECOLOGY  VISIT   HPI: 72 y.o.   Married  Caucasian  female   G3P3 with No LMP recorded. Patient has had a hysterectomy.   here for   vulvar abcess  GYNECOLOGIC HISTORY: No LMP recorded. Patient has had a hysterectomy. Contraception:  hyst Menopausal hormone therapy:  n/a Last mammogram:  02/05/22 Breast density Cat B, BI-RADS CAT 1 neg Last pap smear:    04-30-18 Neg, 02-04-11 Neg         OB History     Gravida  3   Para  3   Term      Preterm      AB      Living  3      SAB      IAB      Ectopic      Multiple      Live Births                 Patient Active Problem List   Diagnosis Date Noted   Right leg pain 03/14/2023   Adverse effect of proton pump inhibitor 12/14/2021   Anxious mood 09/24/2017   Sleep disorder 09/12/2017   Palpitations 05/22/2015   OA (osteoarthritis) of knee 11/21/2014   Colon cancer screening 11/29/2013   Routine general medical examination at a health care facility 09/16/2011   DERMATITIS, ATOPIC 11/27/2010   URTICARIA DUE TO COLD OR HEAT 11/27/2010   EXTERNAL HEMORRHOIDS WITHOUT MENTION COMP 06/25/2010   ABNORMAL EKG 12/26/2009   OTHER CHRONIC SINUSITIS 10/19/2009   Vitamin D deficiency 04/21/2009   ALLERGIC RHINITIS 11/09/2008   GERD 07/28/2008   Osteopenia 03/14/2008   HYPERCHOLESTEROLEMIA, PURE 03/13/2007   HIATAL HERNIA 01/12/2007    Past Medical History:  Diagnosis Date   Acute meniscal tear of knee    Arthritis    "neck; left knee before replacement" (05/10/2015)   Basal cell cancer 02/2007   Breast cyst    "left"   Chest pain 06/2010   [facility]) and palpitations - ruled out for MI with nl. Echo   Complication of anesthesia HARD TO WAKE   Difficulty sleeping    Eczema    GERD (gastroesophageal reflux disease)    Heart palpitations OCCASIONAL--  TAKES ATENOLOL PRN   Hiatal hernia    HSV-2 infection    buttocks   Hyperlipidemia DIET CONTROL   Melanoma of skin (HCC)    MVP (mitral valve prolapse)    per pt  no treatment needed   Normal nuclear stress test 07/09/2010   Osteopenia 12/2013   T score -1.6 FRAX 16%/0.8%   PONV (postoperative nausea and vomiting)    Sleep disorder 09/12/2017    Past Surgical History:  Procedure Laterality Date   Carotid US  9/05   Mild, recheck 1 year   Carotid US  10/06   Normal   CHONDROPLASTY  10/23/2011   Procedure: CHONDROPLASTY;  Surgeon: Loanne Drilling, MD;  Location: [facility];  Service: Orthopedics;  Laterality: Left;  Left medial    COLONOSCOPY     ESOPHAGOGASTRODUODENOSCOPY  9/07   Normal   HYSTEROSCOPY WITH RESECTOSCOPE  2000   POLPECTOMY'S   JOINT REPLACEMENT     KNEE ARTHROSCOPY  10/23/2011   Procedure: ARTHROSCOPY KNEE;  Surgeon: Loanne Drilling, MD;  Location: [facility];  Service: Orthopedics;  Laterality: Left;  LEFT KNEE SCOPE WITH DEBRIDEMENT    MELANOMA EXCISION Right 1995   "side"   MOHS SURGERY Left    "  side of my nose"   MOLE REMOVAL     "several; all over my body"   MRI neck  3/00   C4-C5 herniation small stenosis mild C4-5, C5-6   TONSILLECTOMY     TOTAL KNEE ARTHROPLASTY Left 11/21/2014   Procedure: LEFT TOTAL KNEE ARTHROPLASTY;  Surgeon: Loanne Drilling, MD;  Location: [facility];  Service: Orthopedics;  Laterality: Left;   TRANSTHORACIC ECHOCARDIOGRAM  06-29-2010   NORMAL LVSF, EF 55-60%,  MILD MR   TUBAL LIGATION  1989   VAGINAL HYSTERECTOMY  2001   complex hyperplasia with mother's history of uterine cancer    Current Outpatient Medications  Medication Sig Dispense Refill   acyclovir (ZOVIRAX) 200 MG capsule TAKE 1 CAPSULE (200 MG TOTAL) BY MOUTH DAILY AS NEEDED. 90 capsule 1   acyclovir cream (ZOVIRAX) 5 % Apply 1 application topically daily as needed. 5 g 5   Ascorbic Acid (VITAMIN C PO) Take 1 tablet by mouth daily.     aspirin EC 81 MG tablet Take 1 tablet (81 mg total) by mouth daily.     atenolol (TENORMIN) 25 MG tablet Take 0.5 tablets (12.5 mg total) by mouth daily as needed  (palpatations). 30 tablet 5   CALCIUM CARBONATE PO Take 1 tablet by mouth daily.     Cholecalciferol (VITAMIN D PO) Take 1 tablet by mouth daily.     CINNAMON PO Take 1 capsule by mouth. Takes occasionally     CYANOCOBALAMIN PO Take 1 tablet by mouth daily.     diclofenac sodium (VOLTAREN) 1 % GEL APPLY 4 GRAMS TO THE AFFECTED AREA 3 TIMES DAILY AS NEEDED 100 g 3   ELDERBERRY PO Take 1 tablet by mouth daily.     escitalopram (LEXAPRO) 10 MG tablet Take 0.5 tablets (5 mg total) by mouth daily. 45 tablet 3   esomeprazole (NEXIUM) 20 MG capsule Take 1 capsule (20 mg total) by mouth daily at 12 noon. 1 capsule 0   famotidine (PEPCID) 20 MG tablet TAKE 1 TABLET BY MOUTH TWICE A DAY 180 tablet 2   Garlic 300 MG CAPS Take 1 capsule by mouth daily.     KRILL OIL PO Take by mouth. Takes 1 tab occasionally     magnesium gluconate (MAGONATE) 500 MG tablet Take 500 mg by mouth daily.     Multiple Vitamin (MULTIVITAMIN) capsule Take 1 capsule by mouth daily.     Omega-3 Fatty Acids (ULTRA OMEGA-3 FISH OIL PO) Take by mouth.     Polyethyl Glycol-Propyl Glycol (SYSTANE OP) Apply 1 drop to eye daily as needed (Dry eyes).     No current facility-administered medications for this visit.     ALLERGIES: Amoxicillin, Codeine, Macrobid [nitrofurantoin], Clarithromycin, and Trazodone and nefazodone  Family History  Problem Relation Age of Onset   Cancer Mother        uterine   Obesity Mother    Thrombocytopenia Mother    Diabetes Mother    Osteoarthritis Mother    Alcohol abuse Father    Heart disease Father        CAD in 48's   Emphysema Father    Diabetes Maternal Grandmother        type 2   Heart disease Paternal Aunt        CAD   Heart disease Paternal Uncle        CAD   Osteoarthritis Sister    Osteoarthritis Maternal Aunt    Colon cancer Neg Hx    Breast  cancer Neg Hx     Social History   Socioeconomic History   Marital status: Married    Spouse name: Not on file   Number of  children: Not on file   Years of education: Not on file   Highest education level: Not on file  Occupational History   Not on file  Tobacco Use   Smoking status: Former    Packs/day: 1.00    Years: 15.00    Additional pack years: 0.00    Total pack years: 15.00    Types: Cigarettes    Quit date: 10/07/1978    Years since quitting: 44.5    Passive exposure: Never   Smokeless tobacco: Never  Vaping Use   Vaping Use: Never used  Substance and Sexual Activity   Alcohol use: No    Alcohol/week: 0.0 standard drinks of alcohol   Drug use: No   Sexual activity: Yes    Birth control/protection: Post-menopausal, Surgical    Comment: HYST-1st intercourse 72 yo-Fewer than 5 partners  Other Topics Concern   Not on file  Social History Narrative   Daily caffeine use.   Patient gets regular exercise.   Social Determinants of Health   Financial Resource Strain: Low Risk  (03/11/2022)   Overall Financial Resource Strain (CARDIA)    Difficulty of Paying Living Expenses: Not hard at all  Food Insecurity: No Food Insecurity (03/12/2023)   Hunger Vital Sign    Worried About Running Out of Food in the Last Year: Never true    Ran Out of Food in the Last Year: Never true  Transportation Needs: No Transportation Needs (03/11/2022)   PRAPARE - Administrator, Civil Service (Medical): No    Lack of Transportation (Non-Medical): No  Physical Activity: Insufficiently Active (03/12/2023)   Exercise Vital Sign    Days of Exercise per Week: 2 days    Minutes of Exercise per Session: 30 min  Stress: No Stress Concern Present (03/11/2022)   [employer] of Occupational Health - Occupational Stress Questionnaire    Feeling of Stress : Not at all  Social Connections: Moderately Integrated (03/12/2023)   Social Connection and Isolation Panel [NHANES]    Frequency of Communication with Friends and Family: More than three times a week    Frequency of Social Gatherings with Friends and Family: More  than three times a week    Attends Religious Services: More than 4 times per year    Active Member of [employer] or Organizations: No    Attends Banker Meetings: Never    Marital Status: Married  Catering manager Violence: Not At Risk (03/12/2023)   Humiliation, Afraid, Rape, and Kick questionnaire    Fear of Current or Ex-Partner: No    Emotionally Abused: No    Physically Abused: No    Sexually Abused: No    Review of Systems  PHYSICAL EXAMINATION:    There were no vitals taken for this visit.    General appearance: alert, cooperative and appears stated age Head: Normocephalic, without obvious abnormality, atraumatic Neck: no adenopathy, supple, symmetrical, trachea midline and thyroid normal to inspection and palpation Lungs: clear to auscultation bilaterally Breasts: normal appearance, no masses or tenderness, No nipple retraction or dimpling, No nipple discharge or bleeding, No axillary or supraclavicular adenopathy Heart: regular rate and rhythm Abdomen: soft, non-tender, no masses,  no organomegaly Extremities: extremities normal, atraumatic, no cyanosis or edema Skin: Skin color, texture, turgor normal. No rashes or lesions Lymph nodes:  Cervical, supraclavicular, and axillary nodes normal. No abnormal inguinal nodes palpated Neurologic: Grossly normal  Pelvic: External genitalia:  no lesions              Urethra:  normal appearing urethra with no masses, tenderness or lesions              Bartholins and Skenes: normal                 Vagina: normal appearing vagina with normal color and discharge, no lesions              Cervix: no lesions                Bimanual Exam:  Uterus:  normal size, contour, position, consistency, mobility, non-tender              Adnexa: no mass, fullness, tenderness              Rectal exam: {yes no:314532}.  Confirms.              Anus:  normal sphincter tone, no lesions  Chaperone was present for exam:  ***  ASSESSMENT      PLAN     An After Visit Summary was printed and given to the patient.  ______ minutes face to face time of which over 50% was spent in counseling.

## 2023-04-08 ENCOUNTER — Ambulatory Visit
Admission: RE | Admit: 2023-04-08 | Discharge: 2023-04-08 | Disposition: A | Discharge status: Home / Self Care | Payer: PPO | Source: Ambulatory Visit | Attending: Family Medicine | Admitting: Family Medicine

## 2023-04-08 DIAGNOSIS — Z1231 Encounter for screening mammogram for malignant neoplasm of breast: Secondary | ICD-10-CM

## 2023-04-15 ENCOUNTER — Ambulatory Visit: Payer: PPO | Admitting: Obstetrics & Gynecology

## 2023-04-16 ENCOUNTER — Ambulatory Visit: Payer: PPO | Admitting: Obstetrics and Gynecology

## 2023-04-24 ENCOUNTER — Ambulatory Visit (INDEPENDENT_AMBULATORY_CARE_PROVIDER_SITE_OTHER): Payer: PPO | Admitting: Obstetrics and Gynecology

## 2023-04-24 ENCOUNTER — Encounter: Payer: Self-pay | Admitting: Obstetrics and Gynecology

## 2023-04-24 VITALS — BP 118/76 | HR 77 | Ht 64.25 in | Wt 180.0 lb

## 2023-04-24 DIAGNOSIS — N907 Vulvar cyst: Secondary | ICD-10-CM

## 2023-04-24 NOTE — Progress Notes (Unsigned)
GYNECOLOGY  VISIT   HPI: 72 y.o.   Married  Caucasian  female   G3P3 with No LMP recorded. Patient has had a hysterectomy.   here for   vulvar abscess. Lanced in 2022 and never went away. Feels irritated, was not treated with antibiotics. Same size.  No drainage.  No fevers.    Riding exercise bike.  GYNECOLOGIC HISTORY: No LMP recorded. Patient has had a hysterectomy. Contraception:  hyst Menopausal hormone therapy:  n/a Last mammogram:  04/08/23 Breast density Cat B, BI-RADS CAT 1 neg Last pap smear:    04-30-18 Neg, 02-04-11 Neg         OB History     Gravida  3   Para  3   Term      Preterm      AB      Living  3      SAB      IAB      Ectopic      Multiple      Live Births                 Patient Active Problem List   Diagnosis Date Noted   Right leg pain 03/14/2023   Adverse effect of proton pump inhibitor 12/14/2021   Anxious mood 09/24/2017   Sleep disorder 09/12/2017   Palpitations 05/22/2015   OA (osteoarthritis) of knee 11/21/2014   Colon cancer screening 11/29/2013   Routine general medical examination at a health care facility 09/16/2011   DERMATITIS, ATOPIC 11/27/2010   URTICARIA DUE TO COLD OR HEAT 11/27/2010   EXTERNAL HEMORRHOIDS WITHOUT MENTION COMP 06/25/2010   ABNORMAL EKG 12/26/2009   OTHER CHRONIC SINUSITIS 10/19/2009   Vitamin D deficiency 04/21/2009   ALLERGIC RHINITIS 11/09/2008   GERD 07/28/2008   Osteopenia 03/14/2008   HYPERCHOLESTEROLEMIA, PURE 03/13/2007   HIATAL HERNIA 01/12/2007    Past Medical History:  Diagnosis Date   Acute meniscal tear of knee    Arthritis    "neck; left knee before replacement" (05/10/2015)   Basal cell cancer 02/2007   Breast cyst    "left"   Chest pain 06/2010   [facility]) and palpitations - ruled out for MI with nl. Echo   Complication of anesthesia HARD TO WAKE   Difficulty sleeping    Eczema    GERD (gastroesophageal reflux disease)    Heart palpitations OCCASIONAL--  TAKES ATENOLOL  PRN   Hiatal hernia    HSV-2 infection    buttocks   Hyperlipidemia DIET CONTROL   Melanoma of skin (HCC)    MVP (mitral valve prolapse)    per pt no treatment needed   Normal nuclear stress test 07/09/2010   Osteopenia 12/2013   T score -1.6 FRAX 16%/0.8%   PONV (postoperative nausea and vomiting)    Sleep disorder 09/12/2017    Past Surgical History:  Procedure Laterality Date   Carotid US  9/05   Mild, recheck 1 year   Carotid US  10/06   Normal   CHONDROPLASTY  10/23/2011   Procedure: CHONDROPLASTY;  Surgeon: Loanne Drilling, MD;  Location: [facility];  Service: Orthopedics;  Laterality: Left;  Left medial    COLONOSCOPY     ESOPHAGOGASTRODUODENOSCOPY  9/07   Normal   HYSTEROSCOPY WITH RESECTOSCOPE  2000   POLPECTOMY'S   JOINT REPLACEMENT     KNEE ARTHROSCOPY  10/23/2011   Procedure: ARTHROSCOPY KNEE;  Surgeon: Loanne Drilling, MD;  Location: [facility];  Service: Orthopedics;  Laterality: Left;  LEFT KNEE SCOPE WITH DEBRIDEMENT    MELANOMA EXCISION Right 1995   "side"   MOHS SURGERY Left    "side of my nose"   MOLE REMOVAL     "several; all over my body"   MRI neck  3/00   C4-C5 herniation small stenosis mild C4-5, C5-6   TONSILLECTOMY     TOTAL KNEE ARTHROPLASTY Left 11/21/2014   Procedure: LEFT TOTAL KNEE ARTHROPLASTY;  Surgeon: Loanne Drilling, MD;  Location: [facility];  Service: Orthopedics;  Laterality: Left;   TRANSTHORACIC ECHOCARDIOGRAM  06-29-2010   NORMAL LVSF, EF 55-60%,  MILD MR   TUBAL LIGATION  1989   VAGINAL HYSTERECTOMY  2001   complex hyperplasia with mother's history of uterine cancer    Current Outpatient Medications  Medication Sig Dispense Refill   acyclovir (ZOVIRAX) 200 MG capsule TAKE 1 CAPSULE (200 MG TOTAL) BY MOUTH DAILY AS NEEDED. 90 capsule 1   acyclovir cream (ZOVIRAX) 5 % Apply 1 application topically daily as needed. 5 g 5   Ascorbic Acid (VITAMIN C PO) Take 1 tablet by mouth daily.     aspirin EC 81  MG tablet Take 1 tablet (81 mg total) by mouth daily.     atenolol (TENORMIN) 25 MG tablet Take 0.5 tablets (12.5 mg total) by mouth daily as needed (palpatations). 30 tablet 5   CALCIUM CARBONATE PO Take 1 tablet by mouth daily.     Cholecalciferol (VITAMIN D PO) Take 1 tablet by mouth daily.     CINNAMON PO Take 1 capsule by mouth. Takes occasionally     CYANOCOBALAMIN PO Take 1 tablet by mouth daily.     diclofenac sodium (VOLTAREN) 1 % GEL APPLY 4 GRAMS TO THE AFFECTED AREA 3 TIMES DAILY AS NEEDED 100 g 3   ELDERBERRY PO Take 1 tablet by mouth daily.     escitalopram (LEXAPRO) 10 MG tablet Take 0.5 tablets (5 mg total) by mouth daily. 45 tablet 3   esomeprazole (NEXIUM) 20 MG capsule Take 1 capsule (20 mg total) by mouth daily at 12 noon. 1 capsule 0   famotidine (PEPCID) 20 MG tablet TAKE 1 TABLET BY MOUTH TWICE A DAY 180 tablet 2   Garlic 300 MG CAPS Take 1 capsule by mouth daily.     KRILL OIL PO Take by mouth. Takes 1 tab occasionally     magnesium gluconate (MAGONATE) 500 MG tablet Take 500 mg by mouth daily.     Multiple Vitamin (MULTIVITAMIN) capsule Take 1 capsule by mouth daily.     Omega-3 Fatty Acids (ULTRA OMEGA-3 FISH OIL PO) Take by mouth.     Polyethyl Glycol-Propyl Glycol (SYSTANE OP) Apply 1 drop to eye daily as needed (Dry eyes).     No current facility-administered medications for this visit.     ALLERGIES: Amoxicillin, Codeine, Macrobid [nitrofurantoin], Clarithromycin, and Trazodone and nefazodone  Family History  Problem Relation Age of Onset   Cancer Mother        uterine   Obesity Mother    Thrombocytopenia Mother    Diabetes Mother    Osteoarthritis Mother    Alcohol abuse Father    Heart disease Father        CAD in 42's   Emphysema Father    Diabetes Maternal Grandmother        type 2   Heart disease Paternal Aunt        CAD   Heart disease Paternal Uncle  CAD   Osteoarthritis Sister    Osteoarthritis Maternal Aunt    Colon cancer Neg Hx     Breast cancer Neg Hx     Social History   Socioeconomic History   Marital status: Married    Spouse name: Not on file   Number of children: Not on file   Years of education: Not on file   Highest education level: Not on file  Occupational History   Not on file  Tobacco Use   Smoking status: Former    Current packs/day: 0.00    Average packs/day: 1 pack/day for 15.0 years (15.0 ttl pk-yrs)    Types: Cigarettes    Start date: 10/08/1963    Quit date: 10/07/1978    Years since quitting: 44.5    Passive exposure: Never   Smokeless tobacco: Never  Vaping Use   Vaping status: Never Used  Substance and Sexual Activity   Alcohol use: No    Alcohol/week: 0.0 standard drinks of alcohol   Drug use: No   Sexual activity: Yes    Birth control/protection: Post-menopausal, Surgical    Comment: HYST-1st intercourse 72 yo-Fewer than 5 partners  Other Topics Concern   Not on file  Social History Narrative   Daily caffeine use.   Patient gets regular exercise.   Social Determinants of Health   Financial Resource Strain: Low Risk  (03/11/2022)   Overall Financial Resource Strain (CARDIA)    Difficulty of Paying Living Expenses: Not hard at all  Food Insecurity: No Food Insecurity (03/12/2023)   Hunger Vital Sign    Worried About Running Out of Food in the Last Year: Never true    Ran Out of Food in the Last Year: Never true  Transportation Needs: No Transportation Needs (03/11/2022)   PRAPARE - Administrator, Civil Service (Medical): No    Lack of Transportation (Non-Medical): No  Physical Activity: Insufficiently Active (03/12/2023)   Exercise Vital Sign    Days of Exercise per Week: 2 days    Minutes of Exercise per Session: 30 min  Stress: No Stress Concern Present (03/11/2022)   [employer] of Occupational Health - Occupational Stress Questionnaire    Feeling of Stress : Not at all  Social Connections: Moderately Integrated (03/12/2023)   Social Connection and  Isolation Panel [NHANES]    Frequency of Communication with Friends and Family: More than three times a week    Frequency of Social Gatherings with Friends and Family: More than three times a week    Attends Religious Services: More than 4 times per year    Active Member of [employer] or Organizations: No    Attends Banker Meetings: Never    Marital Status: Married  Catering manager Violence: Not At Risk (03/12/2023)   Humiliation, Afraid, Rape, and Kick questionnaire    Fear of Current or Ex-Partner: No    Emotionally Abused: No    Physically Abused: No    Sexually Abused: No    Review of Systems  All other systems reviewed and are negative.   PHYSICAL EXAMINATION:    There were no vitals taken for this visit.    General appearance: alert, cooperative and appears stated age Head: Normocephalic, without obvious abnormality, atraumatic Neck: no adenopathy, supple, symmetrical, trachea midline and thyroid normal to inspection and palpation Lungs: clear to auscultation bilaterally Breasts: normal appearance, no masses or tenderness, No nipple retraction or dimpling, No nipple discharge or bleeding, No axillary or supraclavicular adenopathy Heart:  regular rate and rhythm Abdomen: soft, non-tender, no masses,  no organomegaly Extremities: extremities normal, atraumatic, no cyanosis or edema Skin: Skin color, texture, turgor normal. No rashes or lesions Lymph nodes: Cervical, supraclavicular, and axillary nodes normal. No abnormal inguinal nodes palpated Neurologic: Grossly normal  Pelvic: External genitalia:  left labia sebaceous cyst 7 mm.  Cherry hemangioma noted.              Urethra:  normal appearing urethra with no masses, tenderness or lesions              Bartholins and Skenes: normal                 Vagina: normal appearing vagina with normal color and discharge, no lesions              Cervix: no lesions                Bimanual Exam:  Uterus:  normal size,  contour, position, consistency, mobility, non-tender              Adnexa: no mass, fullness, tenderness              Rectal exam: {yes no:314532}.  Confirms.              Anus:  normal sphincter tone, no lesions  Chaperone was present for exam:  ***  ASSESSMENT     PLAN     An After Visit Summary was printed and given to the patient.  ______ minutes face to face time of which over 50% was spent in counseling.

## 2023-04-25 NOTE — Progress Notes (Signed)
GYNECOLOGY  VISIT   HPI: 72 y.o.   Married  Caucasian  female   G3P3 with No LMP recorded. Patient has had a hysterectomy.   here for   vulvar cyst removal.  Desires removal of left vulvar sebaceous cyst that is long standing.   GYNECOLOGIC HISTORY: No LMP recorded. Patient has had a hysterectomy. Contraception:  hyst Menopausal hormone therapy:  n/a Last mammogram:  04/08/23 Breast density Cat B, BI-RADS CAT 1 neg  Last pap smear:   04-30-18 Neg, 02-04-11 Neg         OB History     Gravida  3   Para  3   Term      Preterm      AB      Living  3      SAB      IAB      Ectopic      Multiple      Live Births                 Patient Active Problem List   Diagnosis Date Noted   Right leg pain 03/14/2023   Adverse effect of proton pump inhibitor 12/14/2021   Anxious mood 09/24/2017   Sleep disorder 09/12/2017   Palpitations 05/22/2015   OA (osteoarthritis) of knee 11/21/2014   Colon cancer screening 11/29/2013   Routine general medical examination at a health care facility 09/16/2011   DERMATITIS, ATOPIC 11/27/2010   URTICARIA DUE TO COLD OR HEAT 11/27/2010   EXTERNAL HEMORRHOIDS WITHOUT MENTION COMP 06/25/2010   ABNORMAL EKG 12/26/2009   OTHER CHRONIC SINUSITIS 10/19/2009   Vitamin D deficiency 04/21/2009   ALLERGIC RHINITIS 11/09/2008   GERD 07/28/2008   Osteopenia 03/14/2008   HYPERCHOLESTEROLEMIA, PURE 03/13/2007   HIATAL HERNIA 01/12/2007    Past Medical History:  Diagnosis Date   Acute meniscal tear of knee    Arthritis    "neck; left knee before replacement" (05/10/2015)   Basal cell cancer 02/2007   Breast cyst    "left"   Chest pain 06/2010   [facility]) and palpitations - ruled out for MI with nl. Echo   Complication of anesthesia HARD TO WAKE   Difficulty sleeping    Eczema    GERD (gastroesophageal reflux disease)    Heart palpitations OCCASIONAL--  TAKES ATENOLOL PRN   Hiatal hernia    HSV-2 infection    buttocks   Hyperlipidemia DIET  CONTROL   Melanoma of skin (HCC)    MVP (mitral valve prolapse)    per pt no treatment needed   Normal nuclear stress test 07/09/2010   Osteopenia 12/2013   T score -1.6 FRAX 16%/0.8%   PONV (postoperative nausea and vomiting)    Sleep disorder 09/12/2017    Past Surgical History:  Procedure Laterality Date   Carotid US  9/05   Mild, recheck 1 year   Carotid US  10/06   Normal   CHONDROPLASTY  10/23/2011   Procedure: CHONDROPLASTY;  Surgeon: Loanne Drilling, MD;  Location: [facility];  Service: Orthopedics;  Laterality: Left;  Left medial    COLONOSCOPY     ESOPHAGOGASTRODUODENOSCOPY  9/07   Normal   HYSTEROSCOPY WITH RESECTOSCOPE  2000   POLPECTOMY'S   JOINT REPLACEMENT     KNEE ARTHROSCOPY  10/23/2011   Procedure: ARTHROSCOPY KNEE;  Surgeon: Loanne Drilling, MD;  Location: [facility];  Service: Orthopedics;  Laterality: Left;  LEFT KNEE SCOPE WITH DEBRIDEMENT  MELANOMA EXCISION Right 1995   "side"   MOHS SURGERY Left    "side of my nose"   MOLE REMOVAL     "several; all over my body"   MRI neck  3/00   C4-C5 herniation small stenosis mild C4-5, C5-6   TONSILLECTOMY     TOTAL KNEE ARTHROPLASTY Left 11/21/2014   Procedure: LEFT TOTAL KNEE ARTHROPLASTY;  Surgeon: Loanne Drilling, MD;  Location: [facility];  Service: Orthopedics;  Laterality: Left;   TRANSTHORACIC ECHOCARDIOGRAM  06-29-2010   NORMAL LVSF, EF 55-60%,  MILD MR   TUBAL LIGATION  1989   VAGINAL HYSTERECTOMY  2001   complex hyperplasia with mother's history of uterine cancer    Current Outpatient Medications  Medication Sig Dispense Refill   acyclovir (ZOVIRAX) 200 MG capsule TAKE 1 CAPSULE (200 MG TOTAL) BY MOUTH DAILY AS NEEDED. 90 capsule 1   acyclovir cream (ZOVIRAX) 5 % Apply 1 application topically daily as needed. 5 g 5   Ascorbic Acid (VITAMIN C PO) Take 1 tablet by mouth daily.     aspirin EC 81 MG tablet Take 1 tablet (81 mg total) by mouth daily.     atenolol  (TENORMIN) 25 MG tablet Take 0.5 tablets (12.5 mg total) by mouth daily as needed (palpatations). 30 tablet 5   CALCIUM CARBONATE PO Take 1 tablet by mouth daily.     Cholecalciferol (VITAMIN D PO) Take 1 tablet by mouth daily.     CINNAMON PO Take 1 capsule by mouth. Takes occasionally     CYANOCOBALAMIN PO Take 1 tablet by mouth daily.     diclofenac sodium (VOLTAREN) 1 % GEL APPLY 4 GRAMS TO THE AFFECTED AREA 3 TIMES DAILY AS NEEDED 100 g 3   ELDERBERRY PO Take 1 tablet by mouth daily.     escitalopram (LEXAPRO) 10 MG tablet Take 0.5 tablets (5 mg total) by mouth daily. 45 tablet 3   esomeprazole (NEXIUM) 20 MG capsule Take 1 capsule (20 mg total) by mouth daily at 12 noon. 1 capsule 0   famotidine (PEPCID) 20 MG tablet TAKE 1 TABLET BY MOUTH TWICE A DAY 180 tablet 2   Garlic 300 MG CAPS Take 1 capsule by mouth daily.     KRILL OIL PO Take by mouth. Takes 1 tab occasionally     magnesium gluconate (MAGONATE) 500 MG tablet Take 500 mg by mouth daily.     Multiple Vitamin (MULTIVITAMIN) capsule Take 1 capsule by mouth daily.     Omega-3 Fatty Acids (ULTRA OMEGA-3 FISH OIL PO) Take by mouth.     Polyethyl Glycol-Propyl Glycol (SYSTANE OP) Apply 1 drop to eye daily as needed (Dry eyes).     No current facility-administered medications for this visit.     ALLERGIES: Amoxicillin, Codeine, Macrobid [nitrofurantoin], Clarithromycin, and Trazodone and nefazodone  Family History  Problem Relation Age of Onset   Cancer Mother        uterine   Obesity Mother    Thrombocytopenia Mother    Diabetes Mother    Osteoarthritis Mother    Alcohol abuse Father    Heart disease Father        CAD in 30's   Emphysema Father    Diabetes Maternal Grandmother        type 2   Heart disease Paternal Aunt        CAD   Heart disease Paternal Uncle        CAD   Osteoarthritis Sister  Osteoarthritis Maternal Aunt    Colon cancer Neg Hx    Breast cancer Neg Hx     Social History   Socioeconomic  History   Marital status: Married    Spouse name: Not on file   Number of children: Not on file   Years of education: Not on file   Highest education level: Not on file  Occupational History   Not on file  Tobacco Use   Smoking status: Former    Current packs/day: 0.00    Average packs/day: 1 pack/day for 15.0 years (15.0 ttl pk-yrs)    Types: Cigarettes    Start date: 10/08/1963    Quit date: 10/07/1978    Years since quitting: 44.6    Passive exposure: Never   Smokeless tobacco: Never  Vaping Use   Vaping status: Never Used  Substance and Sexual Activity   Alcohol use: No    Alcohol/week: 0.0 standard drinks of alcohol   Drug use: No   Sexual activity: Yes    Birth control/protection: Post-menopausal, Surgical    Comment: HYST-1st intercourse 72 yo-Fewer than 5 partners  Other Topics Concern   Not on file  Social History Narrative   Daily caffeine use.   Patient gets regular exercise.   Social Determinants of Health   Financial Resource Strain: Low Risk  (03/11/2022)   Overall Financial Resource Strain (CARDIA)    Difficulty of Paying Living Expenses: Not hard at all  Food Insecurity: No Food Insecurity (03/12/2023)   Hunger Vital Sign    Worried About Running Out of Food in the Last Year: Never true    Ran Out of Food in the Last Year: Never true  Transportation Needs: No Transportation Needs (03/11/2022)   PRAPARE - Administrator, Civil Service (Medical): No    Lack of Transportation (Non-Medical): No  Physical Activity: Insufficiently Active (03/12/2023)   Exercise Vital Sign    Days of Exercise per Week: 2 days    Minutes of Exercise per Session: 30 min  Stress: No Stress Concern Present (03/11/2022)   [employer] of Occupational Health - Occupational Stress Questionnaire    Feeling of Stress : Not at all  Social Connections: Moderately Integrated (03/12/2023)   Social Connection and Isolation Panel [NHANES]    Frequency of Communication with Friends and  Family: More than three times a week    Frequency of Social Gatherings with Friends and Family: More than three times a week    Attends Religious Services: More than 4 times per year    Active Member of [employer] or Organizations: No    Attends Banker Meetings: Never    Marital Status: Married  Catering manager Violence: Not At Risk (03/12/2023)   Humiliation, Afraid, Rape, and Kick questionnaire    Fear of Current or Ex-Partner: No    Emotionally Abused: No    Physically Abused: No    Sexually Abused: No    Review of Systems  All other systems reviewed and are negative.   PHYSICAL EXAMINATION:    BP 126/84 (BP Location: Left Arm, Patient Position: Sitting, Cuff Size: Normal)   Pulse 77   Ht 5' 4.25" (1.632 m)   Wt 183 lb (83 kg)   SpO2 96%   BMI 31.17 kg/m     General appearance: alert, cooperative and appears stated age   Vulvar cyst removal - 7 - 8 mm in size Consent done.  Sterile prep with betadine. Local 1% lidocaine, lot [id number],  exp 04/2025. Scalpel used to sharply excise the lesion, which was sent to pathology. 3 interrupted 3/0 Vicryl sutures. No complications.  Minimal EBL.  Chaperone was present for exam:  Warren Lacy, CMA  ASSESSMENT  Vulvar cyst excision.   PLAN  Follow biopsy result. Care of biopsy site discussed.  Fu in 2 weeks for suture removal.

## 2023-04-27 NOTE — Patient Instructions (Signed)
Epidermoid Cyst Removal Epidermoid cyst removal is a procedure to remove a fluid-filled sac that forms under your skin (epidermoid cyst). This type of cyst is filled with a thick, oily substance (keratin) that is secreted by your skin glands. Epidermoid cysts may also be called epidermal cysts, or keratin cysts. Normally, the skin secretes this pasty material through a gland or a hair follicle. However, when a skin gland or hair follicle becomes blocked, an epidermoid cyst can form. You may need this procedure if you have an epidermal cyst that becomes large, uncomfortable, or inflamed. Tell a health care provider about: Any allergies you have. All medicines you are taking, including vitamins, herbs, eye drops, creams, and over-the-counter medicines. Any problems you or family members have had with anesthetic medicines. Any blood disorders you have. Any surgeries you have had. Any medical conditions you have now or have had. Whether you are pregnant or may be pregnant. What are the risks? Generally, this is a safe procedure. However, problems may occur, including: Recurrence of the cyst. Bleeding. Infection. Scarring. What happens before the procedure? Ask your health care provider about: Changing or stopping your regular medicines. This is especially important if you are taking diabetes medicines or blood thinners. Taking medicines such as aspirin and ibuprofen. These medicines can thin your blood. Do not take these medicines unless your health care provider tells you to take them. Taking over-the-counter medicines, vitamins, herbs, and supplements. If you have an inflamed or infected cyst, you may have to take antibiotic medicine before the cyst removal. Take your antibiotic as told by your health care provider. Do not stop taking the antibiotic even if you start to feel better. Take a shower on the morning of your procedure. Your health care provider may ask you to use a germ-killing  soap. What happens during the procedure?  You will be given a medicine to numb the area (local anesthetic). The skin around the cyst will be cleaned with a germ-killing solution. The health care provider will make a small incision in your skin over the cyst. The health care provider will separate the cyst from the surrounding tissues that are under your skin. If possible, the cyst will be removed undamaged (intact). If the cyst bursts (ruptures), it will be removed in pieces. After the cyst is removed, the health care provider will control any bleeding and close the incision with small stitches (sutures). Small incisions may not need sutures, and the bleeding will be controlled by applying direct pressure with gauze. The health care provider may apply antibiotic ointment and a bandage (dressing) over the incision. The procedure may vary among health care providers and hospitals. What happens after the procedure? If you are prescribed an antibiotic medicine or ointment, take or apply it as told by your health care provider. Do not stop using the antibiotic even if you start to feel better. Summary Epidermoid cyst removal is a procedure to remove a sac that has formed under your skin. You may need this procedure if you have an epidermoid cyst that becomes large, uncomfortable, or inflamed. The health care provider will make a small incision in your skin to remove the cyst. If you are prescribed an antibiotic medicine before the procedure, after the procedure, or both, use the antibiotic as told by your health care provider. Do not stop using the antibiotic even if you start to feel better. This information is not intended to replace advice given to you by your health care provider. Make sure  you discuss any questions you have with your health care provider. Document Revised: 12/28/2019 Document Reviewed: 12/29/2019 Elsevier Patient Education  2024 [employer].

## 2023-05-02 DIAGNOSIS — M170 Bilateral primary osteoarthritis of knee: Secondary | ICD-10-CM | POA: Diagnosis not present

## 2023-05-06 ENCOUNTER — Other Ambulatory Visit (HOSPITAL_COMMUNITY)
Admission: RE | Admit: 2023-05-06 | Discharge: 2023-05-06 | Disposition: A | Discharge status: Home / Self Care | Payer: PPO | Source: Ambulatory Visit | Attending: Obstetrics and Gynecology | Admitting: Obstetrics and Gynecology

## 2023-05-06 ENCOUNTER — Ambulatory Visit: Payer: PPO | Admitting: Obstetrics and Gynecology

## 2023-05-06 ENCOUNTER — Encounter: Payer: Self-pay | Admitting: Obstetrics and Gynecology

## 2023-05-06 VITALS — BP 126/84 | HR 77 | Ht 64.25 in | Wt 183.0 lb

## 2023-05-06 DIAGNOSIS — N907 Vulvar cyst: Secondary | ICD-10-CM | POA: Diagnosis not present

## 2023-05-06 DIAGNOSIS — L920 Granuloma annulare: Secondary | ICD-10-CM | POA: Diagnosis not present

## 2023-05-06 NOTE — Patient Instructions (Signed)
Vulva Biopsy, Care After The following information offers guidance on how to care for yourself after your procedure. Your health care provider may also give you more specific instructions. If you have problems or questions, contact your health care provider. What can I expect after the procedure? After the procedure, it is common to have: Slight bleeding from the biopsy site. Slight pain or discomfort at the biopsy site. Follow these instructions at home: Biopsy site care  Follow instructions from your health care provider about how to take care of your biopsy site. Make sure you: Clean the area using water and mild soap twice a day or as told by your health care provider. Gently pat the area dry. You may shower 24 hours after the procedure. If you were prescribed an antibiotic ointment, apply it as told by your health care provider. Do not stop using the antibiotic even if your condition improves. If told by your health care provider, take a sitz bath to help with pain and discomfort. This is a warm water bath that you take while sitting down. Do this as often as told by your health care provider. The water should only come up to your hips and cover your buttocks. You may pat the area dry with a soft, clean towel. Leave stitches (sutures), skin glue, or adhesive strips in place. These skin closures may need to stay in place for 2 weeks or longer. If adhesive strip edges start to loosen and curl up, you may trim the loose edges. Do not remove adhesive strips completely unless your health care provider tells you to do that. Check your biopsy site every day for signs of infection. It may be helpful to use a handheld mirror to do this. Check for: Redness, swelling, or more pain. More fluid or blood. Warmth. Pus or a bad smell. Do not rub the biopsy area after urinating. Gently pat the area dry or use a bottle filled with warm water (peri bottle) to clean the area. Gently wipe from front to  back. Lifestyle Wear loose, cotton underwear. Do not wear tight pants. For at least 1 week or until your health care provider approves: Do not use tampons, douche, or put anything inside your vagina. Do not have sex. Until your health care provider approves: Do not exercise, such as running or biking. Do not swim or use a hot tub. General instructions Take over-the-counter and prescription medicines only as told by your health care provider. Drink enough fluid to keep your urine pale yellow. Use a sanitary napkin until the bleeding stops. If told, put ice on the biopsy site. To do this: Place ice in a plastic bag. Place a towel between your skin and the bag. Leave the ice on for 20 minutes, 2-3 times a day. Remove the ice if your skin turns bright red. This is very important. If you cannot feel pain, heat, or cold, you have a greater risk of damage to the area. Keep all follow-up visits. This is important. Contact a health care provider if: You have redness, swelling, or more pain around your biopsy site. You have more fluid or blood coming from your biopsy site. Your biopsy site feels warm to the touch. Your pain is not controlled with medicine or ice packs. You have a fever or chills. Get help right away if: You have heavy bleeding from the vulva. You have pus or a bad smell coming from the biopsy site. You have abdominal pain. Summary After the procedure, it  is common to have slight bleeding and discomfort at the biopsy site. Follow instructions from your health care provider after your biopsy. Take sitz baths as told by your health care provider to help with pain and discomfort. Leave any sutures in place. Contact your health care provider if you notice any signs of infection around the biopsy site, including redness, swelling, more pain, more fluid or blood, or warmth. Keep all follow-up visits. This is important. This information is not intended to replace advice given to you  by your health care provider. Make sure you discuss any questions you have with your health care provider. Document Revised: 06/12/2021 Document Reviewed: 06/12/2021 Elsevier Patient Education  2024 [employer].

## 2023-05-09 NOTE — Progress Notes (Signed)
GYNECOLOGY  VISIT   HPI: 72 y.o.   Married  Caucasian  female   G3P3 with No LMP recorded. Patient has had a hysterectomy.   here for   2 week cyst f/u. Pt has little spotting from the site. Spotting comes and goes.  Some soreness of the vulva that is waxing and waning.  No fevers.   Had benign sebaceous cyst removed from vulva on 05/06/23.  GYNECOLOGIC HISTORY: No LMP recorded. Patient has had a hysterectomy. Contraception:  hyst Menopausal hormone therapy:  n/a Last mammogram:  04/08/23 Breast density Cat B, BI-RADS CAT 1 neg  Last pap smear:   04-30-18 Neg, 02-04-11 Neg         OB History     Gravida  3   Para  3   Term      Preterm      AB      Living  3      SAB      IAB      Ectopic      Multiple      Live Births                 Patient Active Problem List   Diagnosis Date Noted   Right leg pain 03/14/2023   Adverse effect of proton pump inhibitor 12/14/2021   Anxious mood 09/24/2017   Sleep disorder 09/12/2017   Palpitations 05/22/2015   OA (osteoarthritis) of knee 11/21/2014   Colon cancer screening 11/29/2013   Routine general medical examination at a health care facility 09/16/2011   DERMATITIS, ATOPIC 11/27/2010   URTICARIA DUE TO COLD OR HEAT 11/27/2010   EXTERNAL HEMORRHOIDS WITHOUT MENTION COMP 06/25/2010   ABNORMAL EKG 12/26/2009   OTHER CHRONIC SINUSITIS 10/19/2009   Vitamin D deficiency 04/21/2009   ALLERGIC RHINITIS 11/09/2008   GERD 07/28/2008   Osteopenia 03/14/2008   HYPERCHOLESTEROLEMIA, PURE 03/13/2007   HIATAL HERNIA 01/12/2007    Past Medical History:  Diagnosis Date   Acute meniscal tear of knee    Arthritis    "neck; left knee before replacement" (05/10/2015)   Basal cell cancer 02/2007   Breast cyst    "left"   Chest pain 06/2010   [facility]) and palpitations - ruled out for MI with nl. Echo   Complication of anesthesia HARD TO WAKE   Difficulty sleeping    Eczema    GERD (gastroesophageal reflux disease)    Heart  palpitations OCCASIONAL--  TAKES ATENOLOL PRN   Hiatal hernia    HSV-2 infection    buttocks   Hyperlipidemia DIET CONTROL   Melanoma of skin (HCC)    MVP (mitral valve prolapse)    per pt no treatment needed   Normal nuclear stress test 07/09/2010   Osteopenia 12/2013   T score -1.6 FRAX 16%/0.8%   PONV (postoperative nausea and vomiting)    Sleep disorder 09/12/2017    Past Surgical History:  Procedure Laterality Date   Carotid US  9/05   Mild, recheck 1 year   Carotid US  10/06   Normal   CHONDROPLASTY  10/23/2011   Procedure: CHONDROPLASTY;  Surgeon: Loanne Drilling, MD;  Location: [facility];  Service: Orthopedics;  Laterality: Left;  Left medial    COLONOSCOPY     ESOPHAGOGASTRODUODENOSCOPY  9/07   Normal   HYSTEROSCOPY WITH RESECTOSCOPE  2000   POLPECTOMY'S   JOINT REPLACEMENT     KNEE ARTHROSCOPY  10/23/2011   Procedure: ARTHROSCOPY KNEE;  Surgeon:  Loanne Drilling, MD;  Location: [facility];  Service: Orthopedics;  Laterality: Left;  LEFT KNEE SCOPE WITH DEBRIDEMENT    MELANOMA EXCISION Right 1995   "side"   MOHS SURGERY Left    "side of my nose"   MOLE REMOVAL     "several; all over my body"   MRI neck  3/00   C4-C5 herniation small stenosis mild C4-5, C5-6   TONSILLECTOMY     TOTAL KNEE ARTHROPLASTY Left 11/21/2014   Procedure: LEFT TOTAL KNEE ARTHROPLASTY;  Surgeon: Loanne Drilling, MD;  Location: [facility];  Service: Orthopedics;  Laterality: Left;   TRANSTHORACIC ECHOCARDIOGRAM  06-29-2010   NORMAL LVSF, EF 55-60%,  MILD MR   TUBAL LIGATION  1989   VAGINAL HYSTERECTOMY  2001   complex hyperplasia with mother's history of uterine cancer    Current Outpatient Medications  Medication Sig Dispense Refill   acyclovir (ZOVIRAX) 200 MG capsule TAKE 1 CAPSULE (200 MG TOTAL) BY MOUTH DAILY AS NEEDED. 90 capsule 1   acyclovir cream (ZOVIRAX) 5 % Apply 1 application topically daily as needed. 5 g 5   Ascorbic Acid (VITAMIN C PO) Take 1  tablet by mouth daily.     aspirin EC 81 MG tablet Take 1 tablet (81 mg total) by mouth daily.     atenolol (TENORMIN) 25 MG tablet Take 0.5 tablets (12.5 mg total) by mouth daily as needed (palpatations). 30 tablet 5   CALCIUM CARBONATE PO Take 1 tablet by mouth daily.     cephALEXin (KEFLEX) 500 MG capsule Take one capsule by mouth twice a day for 7 days. 14 capsule 0   Cholecalciferol (VITAMIN D PO) Take 1 tablet by mouth daily.     CINNAMON PO Take 1 capsule by mouth. Takes occasionally     CYANOCOBALAMIN PO Take 1 tablet by mouth daily.     diclofenac sodium (VOLTAREN) 1 % GEL APPLY 4 GRAMS TO THE AFFECTED AREA 3 TIMES DAILY AS NEEDED 100 g 3   ELDERBERRY PO Take 1 tablet by mouth daily.     escitalopram (LEXAPRO) 10 MG tablet Take 0.5 tablets (5 mg total) by mouth daily. 45 tablet 3   esomeprazole (NEXIUM) 20 MG capsule Take 1 capsule (20 mg total) by mouth daily at 12 noon. 1 capsule 0   famotidine (PEPCID) 20 MG tablet TAKE 1 TABLET BY MOUTH TWICE A DAY 180 tablet 2   Garlic 300 MG CAPS Take 1 capsule by mouth daily.     KRILL OIL PO Take by mouth. Takes 1 tab occasionally     magnesium gluconate (MAGONATE) 500 MG tablet Take 500 mg by mouth daily.     Multiple Vitamin (MULTIVITAMIN) capsule Take 1 capsule by mouth daily.     Omega-3 Fatty Acids (ULTRA OMEGA-3 FISH OIL PO) Take by mouth.     Polyethyl Glycol-Propyl Glycol (SYSTANE OP) Apply 1 drop to eye daily as needed (Dry eyes).     No current facility-administered medications for this visit.     ALLERGIES: Amoxicillin, Codeine, Macrobid [nitrofurantoin], Clarithromycin, and Trazodone and nefazodone  Family History  Problem Relation Age of Onset   Cancer Mother        uterine   Obesity Mother    Thrombocytopenia Mother    Diabetes Mother    Osteoarthritis Mother    Alcohol abuse Father    Heart disease Father        CAD in 87's   Emphysema Father  Diabetes Maternal Grandmother        type 2   Heart disease Paternal  Aunt        CAD   Heart disease Paternal Uncle        CAD   Osteoarthritis Sister    Osteoarthritis Maternal Aunt    Colon cancer Neg Hx    Breast cancer Neg Hx     Social History   Socioeconomic History   Marital status: Married    Spouse name: Not on file   Number of children: Not on file   Years of education: Not on file   Highest education level: Not on file  Occupational History   Not on file  Tobacco Use   Smoking status: Former    Current packs/day: 0.00    Average packs/day: 1 pack/day for 15.0 years (15.0 ttl pk-yrs)    Types: Cigarettes    Start date: 10/08/1963    Quit date: 10/07/1978    Years since quitting: 44.6    Passive exposure: Never   Smokeless tobacco: Never  Vaping Use   Vaping status: Never Used  Substance and Sexual Activity   Alcohol use: No    Alcohol/week: 0.0 standard drinks of alcohol   Drug use: No   Sexual activity: Yes    Birth control/protection: Post-menopausal, Surgical    Comment: HYST-1st intercourse 72 yo-Fewer than 5 partners  Other Topics Concern   Not on file  Social History Narrative   Daily caffeine use.   Patient gets regular exercise.   Social Determinants of Health   Financial Resource Strain: Low Risk  (03/11/2022)   Overall Financial Resource Strain (CARDIA)    Difficulty of Paying Living Expenses: Not hard at all  Food Insecurity: No Food Insecurity (03/12/2023)   Hunger Vital Sign    Worried About Running Out of Food in the Last Year: Never true    Ran Out of Food in the Last Year: Never true  Transportation Needs: No Transportation Needs (03/11/2022)   PRAPARE - Administrator, Civil Service (Medical): No    Lack of Transportation (Non-Medical): No  Physical Activity: Insufficiently Active (03/12/2023)   Exercise Vital Sign    Days of Exercise per Week: 2 days    Minutes of Exercise per Session: 30 min  Stress: No Stress Concern Present (03/11/2022)   [employer] of Occupational Health - Occupational  Stress Questionnaire    Feeling of Stress : Not at all  Social Connections: Moderately Integrated (03/12/2023)   Social Connection and Isolation Panel [NHANES]    Frequency of Communication with Friends and Family: More than three times a week    Frequency of Social Gatherings with Friends and Family: More than three times a week    Attends Religious Services: More than 4 times per year    Active Member of [employer] or Organizations: No    Attends Banker Meetings: Never    Marital Status: Married  Catering manager Violence: Not At Risk (03/12/2023)   Humiliation, Afraid, Rape, and Kick questionnaire    Fear of Current or Ex-Partner: No    Emotionally Abused: No    Physically Abused: No    Sexually Abused: No    Review of Systems  All other systems reviewed and are negative.   PHYSICAL EXAMINATION:    BP 128/72 (BP Location: Left Arm, Patient Position: Sitting, Cuff Size: Normal)   Pulse 81   Ht 5' 4.25" (1.632 m)   Wt 183 lb (  83 kg)   SpO2 96%   BMI 31.17 kg/m     General appearance: alert, cooperative and appears stated age    Pelvic: External genitalia:  left labia majora with healing incision - one suture remaining and came out with examination.  Very minor erythema.  Induration of the site, mildly tender, minimal serous drainage.  Skin edges are intact.            Chaperone was present for exam:  Warren Lacy, CMA  ASSESSMENT  Status post excision of left vulvar sebaceous cyst.  Possible early cellulitis. PCN allergy with rash.  PLAN  Will prescribe Keflex 500 mg po bid x 7 days.  I discussed possible cross reactivity with PCN and Keflex.  Take Benadryl if develop rash, tongue swelling or SOB and call 911 if needed.  Do warm soaks and clean with soap.  Do not use Vaseline or antibiotic ointment on the area.  FU prn.

## 2023-05-14 ENCOUNTER — Telehealth: Payer: Self-pay

## 2023-05-14 NOTE — Telephone Encounter (Signed)
Pt has sebaceous cyst removal on 05/06/2023 and is calling to report still noticing some pink and occasional tiny red dot on toilet paper when patting dry after using restroom.  Denies seeing pus/discharge or bad smell from area.  Tender/sore to touch or sit but no significant pain.  Denies warm to touch.  Unable to tell if red or swollen.  Denies fever/irritation.   Reports seems that the small amt of blood could potentially be coming from stitched areas.   F/u appt scheduled for 8/15  Please advise.

## 2023-05-15 NOTE — Telephone Encounter (Signed)
Ok to cleanse with soap and warm water.  The stitches may be pulling slightly on the skin and causing the spotting.  I will see her next week for suture removal.

## 2023-05-15 NOTE — Telephone Encounter (Signed)
Spoke with patient, advised as seen below per Dr. Silva.  Patient verbalizes understanding and is agreeable.  Encounter closed.  

## 2023-05-20 NOTE — Telephone Encounter (Signed)
Pt LVM in triage line this AM stating that area is still bleeding and red. Inquiring if needing to be seen sooner than scheduled f/u on 05/22/2023.  Spoke w/ pt:   Pt reports bleeding more than what it was and area is really red. Pt reports it looks like stitches were out.   Unsure if hot/warm to touch.   Unsure if seeing yellow d/c or if from using vaseline.   Denies temp/fever.   Reports soreness in area.

## 2023-05-20 NOTE — Telephone Encounter (Signed)
Pt notified and voiced understanding.   Pt would like to know how you would recommend she cleanse after urinating. States she feels that she needs to cleanse after each void due to urine touching area each time?

## 2023-05-20 NOTE — Telephone Encounter (Signed)
Keep appointment with me for 8/15.   Just use soap and water.   Do not place Vaseline or other products on the area.

## 2023-05-20 NOTE — Telephone Encounter (Signed)
Urine is considered sterile and not infectious in nature.   She does not need to cleanse after each urination.

## 2023-05-21 NOTE — Telephone Encounter (Signed)
Pt notified and voiced understanding. Reported that she is actually doing better today. Will route to provider for final review.

## 2023-05-22 ENCOUNTER — Ambulatory Visit: Payer: PPO | Admitting: Obstetrics and Gynecology

## 2023-05-22 ENCOUNTER — Encounter: Payer: Self-pay | Admitting: Obstetrics and Gynecology

## 2023-05-22 VITALS — BP 128/72 | HR 81 | Ht 64.25 in | Wt 183.0 lb

## 2023-05-22 DIAGNOSIS — L0390 Cellulitis, unspecified: Secondary | ICD-10-CM | POA: Diagnosis not present

## 2023-05-22 MED ORDER — CEPHALEXIN 500 MG PO CAPS: ORAL_CAPSULE | ORAL | 0 refills | Status: DC

## 2023-05-27 ENCOUNTER — Ambulatory Visit: Payer: PPO | Admitting: Obstetrics and Gynecology

## 2023-06-05 DIAGNOSIS — M1711 Unilateral primary osteoarthritis, right knee: Secondary | ICD-10-CM | POA: Diagnosis not present

## 2023-06-12 DIAGNOSIS — M1711 Unilateral primary osteoarthritis, right knee: Secondary | ICD-10-CM | POA: Diagnosis not present

## 2023-06-17 ENCOUNTER — Ambulatory Visit: Payer: PPO | Admitting: Internal Medicine

## 2023-06-19 DIAGNOSIS — M1711 Unilateral primary osteoarthritis, right knee: Secondary | ICD-10-CM | POA: Diagnosis not present

## 2023-08-05 NOTE — Progress Notes (Signed)
72 y.o. G3P3 Married Caucasian female here for annual exam.    If she is stressed she has an outbreak of HSV, couple of times a year.  Does not take antiviral medication for HSV on her buttocks. Has Acyclovir at home.  PCP prescribing.   Having knee problems.  Sees [facility].   Recent fall at home.  Bruised her right arm and left hip.  No fractures.  PCP: Judy Pimple, MD   No LMP recorded. Patient has had a hysterectomy.           Sexually active: Yes.    The current method of family planning is post menopausal status.    Exercising: No.   Smoker:  no  OB History  Gravida Para Term Preterm AB Living  3 3       3   SAB IAB Ectopic Multiple Live Births               # Outcome Date GA Lbr Len/2nd Weight Sex Type Anes PTL Lv  3 Para           2 Para           1 Para              Health Maintenance: Pap:  04/30/18 WNL History of abnormal Pap:  no MMG: 04/08/23 Breast Density Cat B, BI-RADS CAT 1 neg Colonoscopy:   [facility] Colonoscopy          Colonoscopy (Every 10 Years) Next due on 05/28/2025    05/29/2015  Done - lec   Only the first 1 history entries have been loaded, but more history exists.           BMD:  11/30/19  Result  osteopenia bilateral hips.  HIV: n/a Hep C: 09/28/15 neg  Immunization History  Administered Date(s) Administered   Influenza Split 10/12/2012   Influenza,inj,Quad PF,6+ Mos 07/22/2014, 10/06/2015   Pneumococcal Conjugate-13 12/19/2017   Pneumococcal Polysaccharide-23 12/24/2018   Td 01/27/2006   Tdap 12/18/2016   Zoster, Live 11/29/2013       reports that she quit smoking about 44 years ago. Her smoking use included cigarettes. She started smoking about 59 years ago. She has a 15 pack-year smoking history. She has never been exposed to tobacco smoke. She has never used smokeless tobacco. She reports that she does not drink alcohol and does not use drugs.  Past Medical History:  Diagnosis Date   Acute meniscal tear of knee     Arthritis    "neck; left knee before replacement" (05/10/2015)   Basal cell cancer 02/2007   Breast cyst    "left"   Chest pain 06/2010   [facility]) and palpitations - ruled out for MI with nl. Echo   Complication of anesthesia HARD TO WAKE   Difficulty sleeping    Eczema    GERD (gastroesophageal reflux disease)    Heart palpitations OCCASIONAL--  TAKES ATENOLOL PRN   Hiatal hernia    HSV-2 infection    buttocks   Hyperlipidemia DIET CONTROL   Melanoma of skin (HCC)    MVP (mitral valve prolapse)    per pt no treatment needed   Normal nuclear stress test 07/09/2010   Osteopenia 12/2013   T score -1.6 FRAX 16%/0.8%   PONV (postoperative nausea and vomiting)    Sleep disorder 09/12/2017    Past Surgical History:  Procedure Laterality Date   Carotid US  9/05   Mild, recheck 1 year  Carotid US  10/06   Normal   CHONDROPLASTY  10/23/2011   Procedure: CHONDROPLASTY;  Surgeon: Loanne Drilling, MD;  Location: [facility];  Service: Orthopedics;  Laterality: Left;  Left medial    COLONOSCOPY     ESOPHAGOGASTRODUODENOSCOPY  9/07   Normal   HYSTEROSCOPY WITH RESECTOSCOPE  2000   POLPECTOMY'S   JOINT REPLACEMENT     KNEE ARTHROSCOPY  10/23/2011   Procedure: ARTHROSCOPY KNEE;  Surgeon: Loanne Drilling, MD;  Location: [facility];  Service: Orthopedics;  Laterality: Left;  LEFT KNEE SCOPE WITH DEBRIDEMENT    MELANOMA EXCISION Right 1995   "side"   MOHS SURGERY Left    "side of my nose"   MOLE REMOVAL     "several; all over my body"   MRI neck  3/00   C4-C5 herniation small stenosis mild C4-5, C5-6   TONSILLECTOMY     TOTAL KNEE ARTHROPLASTY Left 11/21/2014   Procedure: LEFT TOTAL KNEE ARTHROPLASTY;  Surgeon: Loanne Drilling, MD;  Location: [facility];  Service: Orthopedics;  Laterality: Left;   TRANSTHORACIC ECHOCARDIOGRAM  06-29-2010   NORMAL LVSF, EF 55-60%,  MILD MR   TUBAL LIGATION  1989   VAGINAL HYSTERECTOMY  2001   complex hyperplasia with  mother's history of uterine cancer    Current Outpatient Medications  Medication Sig Dispense Refill   acyclovir (ZOVIRAX) 200 MG capsule TAKE 1 CAPSULE (200 MG TOTAL) BY MOUTH DAILY AS NEEDED. 90 capsule 1   acyclovir cream (ZOVIRAX) 5 % Apply 1 application topically daily as needed. 5 g 5   Ascorbic Acid (VITAMIN C PO) Take 1 tablet by mouth daily.     aspirin EC 81 MG tablet Take 1 tablet (81 mg total) by mouth daily.     atenolol (TENORMIN) 25 MG tablet Take 0.5 tablets (12.5 mg total) by mouth daily as needed (palpatations). 30 tablet 5   CALCIUM CARBONATE PO Take 1 tablet by mouth daily.     cephALEXin (KEFLEX) 500 MG capsule Take one capsule by mouth twice a day for 7 days. 14 capsule 0   Cholecalciferol (VITAMIN D PO) Take 1 tablet by mouth daily.     CINNAMON PO Take 1 capsule by mouth. Takes occasionally     CYANOCOBALAMIN PO Take 1 tablet by mouth daily.     diclofenac sodium (VOLTAREN) 1 % GEL APPLY 4 GRAMS TO THE AFFECTED AREA 3 TIMES DAILY AS NEEDED 100 g 3   ELDERBERRY PO Take 1 tablet by mouth daily.     escitalopram (LEXAPRO) 10 MG tablet Take 0.5 tablets (5 mg total) by mouth daily. 45 tablet 3   esomeprazole (NEXIUM) 20 MG capsule Take 1 capsule (20 mg total) by mouth daily at 12 noon. 1 capsule 0   famotidine (PEPCID) 20 MG tablet TAKE 1 TABLET BY MOUTH TWICE A DAY 180 tablet 2   Garlic 300 MG CAPS Take 1 capsule by mouth daily.     KRILL OIL PO Take by mouth. Takes 1 tab occasionally     magnesium gluconate (MAGONATE) 500 MG tablet Take 500 mg by mouth daily.     Multiple Vitamin (MULTIVITAMIN) capsule Take 1 capsule by mouth daily.     Omega-3 Fatty Acids (ULTRA OMEGA-3 FISH OIL PO) Take by mouth.     Polyethyl Glycol-Propyl Glycol (SYSTANE OP) Apply 1 drop to eye daily as needed (Dry eyes).     No current facility-administered medications for this visit.  Family History  Problem Relation Age of Onset   Cancer Mother        uterine   Obesity Mother     Thrombocytopenia Mother    Diabetes Mother    Osteoarthritis Mother    Alcohol abuse Father    Heart disease Father        CAD in 48's   Emphysema Father    Diabetes Maternal Grandmother        type 2   Heart disease Paternal Aunt        CAD   Heart disease Paternal Uncle        CAD   Osteoarthritis Sister    Osteoarthritis Maternal Aunt    Colon cancer Neg Hx    Breast cancer Neg Hx     Review of Systems  All other systems reviewed and are negative.   Exam:   BP 122/84 (BP Location: Left Arm, Patient Position: Sitting, Cuff Size: Normal)   Pulse 86   Ht 5\' 5"  (1.651 m)   Wt 185 lb (83.9 kg)   SpO2 95%   BMI 30.79 kg/m     General appearance: alert, cooperative and appears stated age Head: normocephalic, without obvious abnormality, atraumatic Neck: no adenopathy, supple, symmetrical, trachea midline and thyroid normal to inspection and palpation Lungs: clear to auscultation bilaterally Breasts: normal appearance, no masses or tenderness, No nipple retraction or dimpling, No nipple discharge or bleeding, No axillary adenopathy Heart: regular rate and rhythm Abdomen: soft, non-tender; no masses, no organomegaly Extremities: extremities normal, atraumatic, no cyanosis or edema Skin: skin color, texture, turgor normal. No rashes.  Large yellow bruise of right arm and left hip.   Lymph nodes: cervical, supraclavicular, and axillary nodes normal. Neurologic: grossly normal  Pelvic: External genitalia:  no lesions              No abnormal inguinal nodes palpated.              Urethra:  normal appearing urethra with no masses, tenderness or lesions              Bartholins and Skenes: normal                 Vagina: normal appearing vagina with normal color and discharge, no lesions              Cervix: absent              Pap taken: no Bimanual Exam:  Uterus:  absent              Adnexa: no mass, fullness, tenderness              Rectal exam: yes.  Confirms.               Anus:  normal sphincter tone, no lesions  Chaperone was present for exam:  Warren Lacy, CMA   Assessment: Encounter for breast and pelvic exam. Personal history of other medical treatment.  Hx HSV II.  Acyclovir through PCP.  Status post TVH.  Ovaries remain.  Osteopenia of bilateral hips.  Recent fall.  Menopausal female.  Plan: Mammogram screening discussed. Self breast awareness reviewed. Pap not indicated.  Guidelines for Calcium, Vitamin D, regular exercise program including cardiovascular and weight bearing exercise. [facility] at [facility].  Follow up in 1 or 2 years for breast and pelvic exam based on patient preference.   25 min  total time was spent for this patient encounter, including preparation,  face-to-face counseling with the patient, coordination of care, and documentation of the encounter in addition to doing breast and pelvic exam.

## 2023-08-14 DIAGNOSIS — Z85828 Personal history of other malignant neoplasm of skin: Secondary | ICD-10-CM | POA: Diagnosis not present

## 2023-08-14 DIAGNOSIS — L570 Actinic keratosis: Secondary | ICD-10-CM | POA: Diagnosis not present

## 2023-08-14 DIAGNOSIS — D485 Neoplasm of uncertain behavior of skin: Secondary | ICD-10-CM | POA: Diagnosis not present

## 2023-08-14 DIAGNOSIS — D1801 Hemangioma of skin and subcutaneous tissue: Secondary | ICD-10-CM | POA: Diagnosis not present

## 2023-08-19 ENCOUNTER — Ambulatory Visit (INDEPENDENT_AMBULATORY_CARE_PROVIDER_SITE_OTHER): Payer: PPO | Admitting: Obstetrics and Gynecology

## 2023-08-19 ENCOUNTER — Encounter: Payer: Self-pay | Admitting: Obstetrics and Gynecology

## 2023-08-19 VITALS — BP 122/84 | HR 86 | Ht 65.0 in | Wt 185.0 lb

## 2023-08-19 DIAGNOSIS — Z01419 Encounter for gynecological examination (general) (routine) without abnormal findings: Secondary | ICD-10-CM

## 2023-08-19 DIAGNOSIS — Z9289 Personal history of other medical treatment: Secondary | ICD-10-CM | POA: Diagnosis not present

## 2023-08-19 DIAGNOSIS — M8589 Other specified disorders of bone density and structure, multiple sites: Secondary | ICD-10-CM | POA: Diagnosis not present

## 2023-08-19 DIAGNOSIS — Z9181 History of falling: Secondary | ICD-10-CM | POA: Diagnosis not present

## 2023-08-19 DIAGNOSIS — Z9189 Other specified personal risk factors, not elsewhere classified: Secondary | ICD-10-CM

## 2023-08-19 DIAGNOSIS — Z780 Asymptomatic menopausal state: Secondary | ICD-10-CM

## 2023-08-19 DIAGNOSIS — B009 Herpesviral infection, unspecified: Secondary | ICD-10-CM

## 2023-08-19 NOTE — Patient Instructions (Signed)

## 2023-09-02 ENCOUNTER — Ambulatory Visit (INDEPENDENT_AMBULATORY_CARE_PROVIDER_SITE_OTHER): Payer: PPO

## 2023-09-02 ENCOUNTER — Ambulatory Visit: Payer: PPO | Attending: Internal Medicine | Admitting: Internal Medicine

## 2023-09-02 ENCOUNTER — Encounter: Payer: Self-pay | Admitting: Internal Medicine

## 2023-09-02 VITALS — BP 122/90 | HR 80 | Ht 65.0 in | Wt 184.8 lb

## 2023-09-02 DIAGNOSIS — R002 Palpitations: Secondary | ICD-10-CM | POA: Diagnosis not present

## 2023-09-02 DIAGNOSIS — R011 Cardiac murmur, unspecified: Secondary | ICD-10-CM

## 2023-09-02 DIAGNOSIS — R9431 Abnormal electrocardiogram [ECG] [EKG]: Secondary | ICD-10-CM | POA: Diagnosis not present

## 2023-09-02 NOTE — Progress Notes (Signed)
Cardiology Office Note   Date:  09/02/2023   ID:  [name] [name], DOB 11-Aug-1951, MRN [id number]  PCP:  Judy Pimple, MD  Cardiologist:   Dietrich Pates, MD   Pt presents for follow up of CP and palpitatons      History of Present Illness: [name] [name] is a 72 y.o. female with a history of palpitations and CP  Also a history of GERD   THe pt was last seen in cardiology in 2016 Myovew in 2016   was normal  Echo in 2016 showed LVEF normal  ? Wall motion abnormalities laterally    The pt notes occasionally she gets chest tightness  NOt associated with activity Does not take atenolol often  Worse if drinks caffeine   She says she is less active due to arthritis in knees  Diet:   Oatmeal/ eggs / sausage biscut  / protein shck Lunch     2 or 3 PM   Bigger meal Dinner    Small meal   No outpatient medications have been marked as taking for the 09/02/23 encounter (Office Visit) with Pricilla Riffle, MD.     Allergies:   Amoxicillin, Codeine, Macrobid [nitrofurantoin], Clarithromycin, and Trazodone and nefazodone   Past Medical History:  Diagnosis Date   Acute meniscal tear of knee    Arthritis    "neck; left knee before replacement" (05/10/2015)   Basal cell cancer 02/2007   Breast cyst    "left"   Chest pain 06/2010   [facility]) and palpitations - ruled out for MI with nl. Echo   Complication of anesthesia HARD TO WAKE   Difficulty sleeping    Eczema    GERD (gastroesophageal reflux disease)    Heart palpitations OCCASIONAL--  TAKES ATENOLOL PRN   Hiatal hernia    HSV-2 infection    buttocks   Hyperlipidemia DIET CONTROL   Melanoma of skin (HCC)    MVP (mitral valve prolapse)    per pt no treatment needed   Normal nuclear stress test 07/09/2010   Osteopenia 12/2013   T score -1.6 FRAX 16%/0.8%   PONV (postoperative nausea and vomiting)    Sleep disorder 09/12/2017    Past Surgical History:  Procedure Laterality Date   Carotid US  9/05   Mild, recheck 1 year    Carotid US  10/06   Normal   CHONDROPLASTY  10/23/2011   Procedure: CHONDROPLASTY;  Surgeon: Loanne Drilling, MD;  Location: [facility];  Service: Orthopedics;  Laterality: Left;  Left medial    COLONOSCOPY     ESOPHAGOGASTRODUODENOSCOPY  9/07   Normal   HYSTEROSCOPY WITH RESECTOSCOPE  2000   POLPECTOMY'S   JOINT REPLACEMENT     KNEE ARTHROSCOPY  10/23/2011   Procedure: ARTHROSCOPY KNEE;  Surgeon: Loanne Drilling, MD;  Location: [facility];  Service: Orthopedics;  Laterality: Left;  LEFT KNEE SCOPE WITH DEBRIDEMENT    MELANOMA EXCISION Right 1995   "side"   MOHS SURGERY Left    "side of my nose"   MOLE REMOVAL     "several; all over my body"   MRI neck  3/00   C4-C5 herniation small stenosis mild C4-5, C5-6   TONSILLECTOMY     TOTAL KNEE ARTHROPLASTY Left 11/21/2014   Procedure: LEFT TOTAL KNEE ARTHROPLASTY;  Surgeon: Loanne Drilling, MD;  Location: [facility];  Service: Orthopedics;  Laterality: Left;   TRANSTHORACIC ECHOCARDIOGRAM  06-29-2010   NORMAL LVSF, EF  55-60%,  MILD MR   TUBAL LIGATION  1989   VAGINAL HYSTERECTOMY  2001   complex hyperplasia with mother's history of uterine cancer     Social History:  The patient  reports that she quit smoking about 44 years ago. Her smoking use included cigarettes. She started smoking about 59 years ago. She has a 15 pack-year smoking history. She has never been exposed to tobacco smoke. She has never used smokeless tobacco. She reports that she does not drink alcohol and does not use drugs.   Family History:  The patient's family history includes Alcohol abuse in her father; Cancer in her mother; Diabetes in her maternal grandmother and mother; Emphysema in her father; Heart disease in her father, paternal aunt, and paternal uncle; Obesity in her mother; Osteoarthritis in her maternal aunt, mother, and sister; Thrombocytopenia in her mother.    ROS:  Please see the history of present illness. All other systems  are reviewed and  Negative to the above problem except as noted.    PHYSICAL EXAM: VS:  BP (!) 122/90 (BP Location: Left Arm, Patient Position: Sitting, Cuff Size: Normal)   Pulse 80   Ht 5\' 5"  (1.651 m)   Wt 184 lb 12.8 oz (83.8 kg)   SpO2 96%   BMI 30.75 kg/m   GEN: Obese 72 yo  in no acute distress  HEENT: normal  Neck: no JVD, carotid bruits Cardiac: RRR; no murmur  No LE edema  Respiratory:  clear to auscultation  No hepatomegaly  MS: no deformity Moving all extremities     EKG:  EKG is ordered today.  SR 60 bpm   Lipid Panel    Component Value Date/Time   CHOL 186 03/14/2023 0918   TRIG 152.0 (H) 03/14/2023 0918   HDL 49.20 03/14/2023 0918   CHOLHDL 4 03/14/2023 0918   VLDL 30.4 03/14/2023 0918   LDLCALC 107 (H) 03/14/2023 0918   LDLDIRECT 124.4 09/12/2010 0924      Wt Readings from Last 3 Encounters:  09/02/23 184 lb 12.8 oz (83.8 kg)  08/19/23 185 lb (83.9 kg)  05/22/23 183 lb (83 kg)      ASSESSMENT AND PLAN:  1  palpitations   Infrequent   Will get 1 wk monitor to evaluate burden  Willl also get echo to reevluate LVEF and all motion.  2  chest tightness,   The pt has intermitt tightness   not associated with aactivity  REpeat echo      3    HCM   Last lipids In Jun 2027  LDL 107  HDL 49  Reviewed diet   Current medicines are reviewed at length with the patient today.  The patient does not have concerns regarding medicines.  Signed, Dietrich Pates, MD  09/02/2023 1:43 PM    [facility] [address] [place], [place], [place]  [address] Phone: [phone]; Fax: [phone]

## 2023-09-02 NOTE — Progress Notes (Unsigned)
Enrolled for [employer] to mail a ZIO XT long term holter monitor to the patients address on file.  

## 2023-09-02 NOTE — Patient Instructions (Signed)
Medication Instructions:   *If you need a refill on your cardiac medications before your next appointment, please call your pharmacy*   Lab Work:  If you have labs (blood work) drawn today and your tests are completely normal, you will receive your results only by: MyChart Message (if you have MyChart) OR A paper copy in the mail If you have any lab test that is abnormal or we need to change your treatment, we will call you to review the results.   Testing/Procedures: Your physician has requested that you have an echocardiogram. Echocardiography is a painless test that uses sound waves to create images of your heart. It provides your doctor with information about the size and shape of your heart and how well your heart's chambers and valves are working. This procedure takes approximately one hour. There are no restrictions for this procedure. Please do NOT wear cologne, perfume, aftershave, or lotions (deodorant is allowed). Please arrive 15 minutes prior to your appointment time.  Please note: We ask at that you not bring children with you during ultrasound (echo/ vascular) testing. Due to room size and safety concerns, children are not allowed in the ultrasound rooms during exams. Our front office staff cannot provide observation of children in our lobby area while testing is being conducted. An adult accompanying a patient to their appointment will only be allowed in the ultrasound room at the discretion of the ultrasound technician under special circumstances. We apologize for any inconvenience.   ZIO XT- Long Term Monitor Instructions  Your physician has requested you wear a ZIO patch monitor for 7 days.  This is a single patch monitor. [employer] supplies one patch monitor per enrollment. Additional stickers are not available. Please do not apply patch if you will be having a Nuclear Stress Test,  Echocardiogram, Cardiac CT, MRI, or Chest Xray during the period you would be wearing the   monitor. The patch cannot be worn during these tests. You cannot remove and re-apply the  ZIO XT patch monitor.  Your ZIO patch monitor will be mailed 3 day USPS to your address on file. It may take 3-5 days  to receive your monitor after you have been enrolled.  Once you have received your monitor, please review the enclosed instructions. Your monitor  has already been registered assigning a specific monitor serial # to you.  Billing and Patient Assistance Program Information  We have supplied [employer] with any of your insurance information on file for billing purposes. [employer] offers a sliding scale Patient Assistance Program for patients that do not have  insurance, or whose insurance does not completely cover the cost of the ZIO monitor.  You must apply for the Patient Assistance Program to qualify for this discounted rate.  To apply, please call [employer] at [phone], select option 4, select option 2, ask to apply for  Patient Assistance Program. Meredeth Ide will ask your household income, and how many people  are in your household. They will quote your out-of-pocket cost based on that information.  [employer] will also be able to set up a 70-month, interest-free payment plan if needed.  Applying the monitor   Shave hair from upper left chest.  Hold abrader disc by orange tab. Rub abrader in 40 strokes over the upper left chest as  indicated in your monitor instructions.  Clean area with 4 enclosed alcohol pads. Let dry.  Apply patch as indicated in monitor instructions. Patch will be placed under collarbone on left  side of chest  with arrow pointing upward.  Rub patch adhesive wings for 2 minutes. Remove white label marked "1". Remove the white  label marked "2". Rub patch adhesive wings for 2 additional minutes.  While looking in a mirror, press and release button in center of patch. A small green light will  flash 3-4 times. This will be your only indicator that the monitor has been  turned on.  Do not shower for the first 24 hours. You may shower after the first 24 hours.  Press the button if you feel a symptom. You will hear a small click. Record Date, Time and  Symptom in the Patient Logbook.  When you are ready to remove the patch, follow instructions on the last 2 pages of Patient  Logbook. Stick patch monitor onto the last page of Patient Logbook.  Place Patient Logbook in the blue and white box. Use locking tab on box and tape box closed  securely. The blue and white box has prepaid postage on it. Please place it in the mailbox as  soon as possible. Your physician should have your test results approximately 7 days after the  monitor has been mailed back to [facility].  Call [facility] Customer Care at [phone] if you have questions regarding  your ZIO XT patch monitor. Call them immediately if you see an orange light blinking on your  monitor.  If your monitor falls off in less than 4 days, contact our Monitor department at [phone].  If your monitor becomes loose or falls off after 4 days call [employer] at [phone] for  suggestions on securing your monitor    Follow-Up: At [facility], you and your health needs are our priority.  As part of our continuing mission to provide you with exceptional heart care, we have created designated Provider Care Teams.  These Care Teams include your primary Cardiologist (physician) and Advanced Practice Providers (APPs -  Physician Assistants and Nurse Practitioners) who all work together to provide you with the care you need, when you need it.  We recommend signing up for the patient portal called "MyChart".  Sign up information is provided on this After Visit Summary.  MyChart is used to connect with patients for Virtual Visits (Telemedicine).  Patients are able to view lab/test results, encounter notes, upcoming appointments, etc.  Non-urgent messages can be sent to your provider as well.   To  learn more about what you can do with MyChart, go to [email or web address].

## 2023-09-16 ENCOUNTER — Telehealth: Payer: Self-pay | Admitting: Internal Medicine

## 2023-09-16 NOTE — Telephone Encounter (Signed)
Patient can go ahead and apply her ZIO XT monitor.  She does not need to charge it.

## 2023-09-16 NOTE — Telephone Encounter (Signed)
Patient states she received her heart monitor was received 3 weeks ago, but she never put it on. She would like to know if it is too late to do so now. Please advise.

## 2023-10-03 DIAGNOSIS — R9431 Abnormal electrocardiogram [ECG] [EKG]: Secondary | ICD-10-CM | POA: Diagnosis not present

## 2023-10-03 DIAGNOSIS — R011 Cardiac murmur, unspecified: Secondary | ICD-10-CM | POA: Diagnosis not present

## 2023-10-03 DIAGNOSIS — R002 Palpitations: Secondary | ICD-10-CM | POA: Diagnosis not present

## 2023-10-06 ENCOUNTER — Telehealth (HOSPITAL_BASED_OUTPATIENT_CLINIC_OR_DEPARTMENT_OTHER): Payer: Self-pay | Admitting: Obstetrics and Gynecology

## 2023-10-09 ENCOUNTER — Ambulatory Visit (HOSPITAL_COMMUNITY): Payer: Self-pay

## 2023-10-14 DIAGNOSIS — R002 Palpitations: Secondary | ICD-10-CM | POA: Diagnosis not present

## 2023-10-14 DIAGNOSIS — R9431 Abnormal electrocardiogram [ECG] [EKG]: Secondary | ICD-10-CM | POA: Diagnosis not present

## 2023-10-15 ENCOUNTER — Ambulatory Visit (HOSPITAL_BASED_OUTPATIENT_CLINIC_OR_DEPARTMENT_OTHER)
Admission: RE | Admit: 2023-10-15 | Discharge: 2023-10-15 | Disposition: A | Discharge status: Home / Self Care | Payer: PPO | Source: Ambulatory Visit | Attending: Obstetrics and Gynecology | Admitting: Obstetrics and Gynecology

## 2023-10-15 DIAGNOSIS — Z780 Asymptomatic menopausal state: Secondary | ICD-10-CM | POA: Diagnosis not present

## 2023-10-15 DIAGNOSIS — M85851 Other specified disorders of bone density and structure, right thigh: Secondary | ICD-10-CM | POA: Diagnosis not present

## 2023-10-15 DIAGNOSIS — M8589 Other specified disorders of bone density and structure, multiple sites: Secondary | ICD-10-CM | POA: Diagnosis not present

## 2023-10-17 ENCOUNTER — Ambulatory Visit (HOSPITAL_COMMUNITY): Payer: PPO | Attending: Cardiovascular Disease

## 2023-10-17 DIAGNOSIS — R002 Palpitations: Secondary | ICD-10-CM | POA: Insufficient documentation

## 2023-10-17 DIAGNOSIS — R9431 Abnormal electrocardiogram [ECG] [EKG]: Secondary | ICD-10-CM | POA: Diagnosis not present

## 2023-10-17 DIAGNOSIS — R011 Cardiac murmur, unspecified: Secondary | ICD-10-CM | POA: Insufficient documentation

## 2023-10-17 LAB — ECHOCARDIOGRAM COMPLETE
Area-P 1/2: 2.83 cm2
MV M vel: 5.5 m/s
MV Peak grad: 121 mm[Hg]
Radius: 0.6 cm
S' Lateral: 4 cm

## 2023-10-20 ENCOUNTER — Telehealth: Payer: Self-pay | Admitting: Internal Medicine

## 2023-10-20 NOTE — Telephone Encounter (Signed)
 Pt aware of test results would like a return call from Dr Tenny Craw will forward message   Per pt can speak  today or may talk tomorrow anytime before 11:00 am .Zack Seal

## 2023-10-20 NOTE — Telephone Encounter (Signed)
 I tried to call patient  Not able to leave VM on machine Echo last week showed normal pumping function of her heart THe mitral valve has some regurgitation.   It is at least moderate, may be more severe THis may explain some of her symptoms I would recomm a TEE to evaluate to valve more closely to 1. See exact mechanism for why valve isnt closing normally and 2   Potentially explain her symptoms Happy to talk to her more   Just need to get a time to call        Sinus rhythm  Rates 48 to 126 bpm  Average HR 72 bpm Frequent PVCs (6.1% total) Bursts of SVT longest for 16 beats Diary entry correlated with SR   REcomm   OVerall looks OK   If palpitations bothersome can add a low dose of b blocker   (Toprol XL 25 mg )  not necessary   Stay hydrated

## 2023-10-20 NOTE — Telephone Encounter (Signed)
Patient returned staff call regarding test results.

## 2023-10-21 ENCOUNTER — Telehealth: Payer: Self-pay

## 2023-10-21 DIAGNOSIS — Z01810 Encounter for preprocedural cardiovascular examination: Secondary | ICD-10-CM

## 2023-10-21 NOTE — Telephone Encounter (Signed)
 Pt advised her Echo and monitor results and agreed to a TEE.. DR Tenny Craw will call and talk further with the pt. Pt verbalized understanding.   TEE with Dr Anne Fu for 10/28/23 at 10:30 am.

## 2023-10-21 NOTE — Telephone Encounter (Signed)
 Left a message for the pt to call after she reviews her My Chart message re: her TEE instructions.. to let me know if she has any questions.

## 2023-10-21 NOTE — Telephone Encounter (Signed)
-----   Message from Crawford sent at 10/20/2023  1:17 PM EST ----- I tried to call patient  Not able to leave VM on machine Echo last week showed normal pumping function of her heart  THe mitral valve has some regurgitation.   It is at least moderate, may be more severe  THis may explain some of her symptoms  I would recomm a TEE to evaluate to valve more closely to 1. See exact mechanism for why valve isnt closing normally and 2   Potentially explain her symptoms  Happy to talk to her more   Just need to get a time to call

## 2023-10-22 ENCOUNTER — Telehealth: Payer: Self-pay | Admitting: Internal Medicine

## 2023-10-22 DIAGNOSIS — I340 Nonrheumatic mitral (valve) insufficiency: Secondary | ICD-10-CM

## 2023-10-22 NOTE — Telephone Encounter (Signed)
 Pt returning nurses call from yesterday regarding TEE instructions. Please advise

## 2023-10-22 NOTE — Telephone Encounter (Signed)
 Spoke with patient and she states she did see the instructions in my chart. She wanted to know if she needs to have her nails removed. Did advised that with acrylic powder that it be hard to read oxygen levels. She stated she just got them done and did not want to take it off. She has SNS Scientist, product/process development. (Acrylic powder) She wants someone to confirm it is absolutely necessary.

## 2023-10-23 ENCOUNTER — Other Ambulatory Visit: Payer: Self-pay

## 2023-10-23 DIAGNOSIS — Z01810 Encounter for preprocedural cardiovascular examination: Secondary | ICD-10-CM

## 2023-10-24 LAB — BASIC METABOLIC PANEL
BUN/Creatinine Ratio: 23 (ref 12–28)
BUN: 18 mg/dL (ref 8–27)
CO2: 24 mmol/L (ref 20–29)
Calcium: 9.8 mg/dL (ref 8.7–10.3)
Chloride: 103 mmol/L (ref 96–106)
Creatinine, Ser: 0.79 mg/dL (ref 0.57–1.00)
Glucose: 79 mg/dL (ref 70–99)
Potassium: 4.1 mmol/L (ref 3.5–5.2)
Sodium: 143 mmol/L (ref 134–144)
eGFR: 80 mL/min/{1.73_m2} (ref >59)

## 2023-10-24 LAB — CBC
Hematocrit: 44.9 % (ref 34.0–46.6)
Hemoglobin: 15.3 g/dL (ref 11.1–15.9)
MCH: 33.1 pg — ABNORMAL HIGH (ref 26.6–33.0)
MCHC: 34.1 g/dL (ref 31.5–35.7)
MCV: 97 fL (ref 79–97)
Platelets: 208 10*3/uL (ref 150–450)
RBC: 4.62 x10E6/uL (ref 3.77–5.28)
RDW: 12.3 % (ref 11.7–15.4)
WBC: 5.9 10*3/uL (ref 3.4–10.8)

## 2023-10-25 ENCOUNTER — Encounter: Payer: Self-pay | Admitting: Internal Medicine

## 2023-10-27 NOTE — Progress Notes (Signed)
 Spoke to patient and instructed them to come at 07:30  and to be NPO after 0000. Medications reviewed.   Confirmed that patient will have a ride home and someone to stay with them for 24 hours after the procedure.

## 2023-10-28 ENCOUNTER — Encounter (HOSPITAL_COMMUNITY)
Admission: RE | Disposition: A | Discharge status: Home / Self Care | Payer: Self-pay | Source: Ambulatory Visit | Attending: Cardiology

## 2023-10-28 ENCOUNTER — Ambulatory Visit (HOSPITAL_COMMUNITY): Payer: Self-pay | Admitting: Anesthesiology

## 2023-10-28 ENCOUNTER — Other Ambulatory Visit: Payer: Self-pay

## 2023-10-28 ENCOUNTER — Ambulatory Visit (HOSPITAL_COMMUNITY)
Admission: RE | Admit: 2023-10-28 | Discharge: 2023-10-28 | Disposition: A | Discharge status: Home / Self Care | Payer: PPO | Source: Ambulatory Visit | Attending: Cardiology | Admitting: Cardiology

## 2023-10-28 DIAGNOSIS — I081 Rheumatic disorders of both mitral and tricuspid valves: Secondary | ICD-10-CM | POA: Diagnosis not present

## 2023-10-28 DIAGNOSIS — I340 Nonrheumatic mitral (valve) insufficiency: Secondary | ICD-10-CM

## 2023-10-28 DIAGNOSIS — R002 Palpitations: Secondary | ICD-10-CM | POA: Insufficient documentation

## 2023-10-28 DIAGNOSIS — M170 Bilateral primary osteoarthritis of knee: Secondary | ICD-10-CM | POA: Insufficient documentation

## 2023-10-28 DIAGNOSIS — R0789 Other chest pain: Secondary | ICD-10-CM | POA: Insufficient documentation

## 2023-10-28 DIAGNOSIS — K219 Gastro-esophageal reflux disease without esophagitis: Secondary | ICD-10-CM | POA: Diagnosis not present

## 2023-10-28 DIAGNOSIS — Z79899 Other long term (current) drug therapy: Secondary | ICD-10-CM | POA: Insufficient documentation

## 2023-10-28 HISTORY — PX: TRANSESOPHAGEAL ECHOCARDIOGRAM (CATH LAB): EP1270

## 2023-10-28 LAB — ECHO TEE: Radius: 0.6 cm

## 2023-10-28 SURGERY — TRANSESOPHAGEAL ECHOCARDIOGRAM (TEE) (CATHLAB)
Anesthesia: Monitor Anesthesia Care

## 2023-10-28 MED ORDER — PROPOFOL 10 MG/ML IV BOLUS
INTRAVENOUS | Status: DC | PRN
  Administered 2023-10-28: 20 mg via INTRAVENOUS
  Administered 2023-10-28: 50 mg via INTRAVENOUS
  Administered 2023-10-28: 30 mg via INTRAVENOUS

## 2023-10-28 MED ORDER — LIDOCAINE 2% (20 MG/ML) 5 ML SYRINGE
INTRAMUSCULAR | Status: DC | PRN
  Administered 2023-10-28: 100 mg via INTRAVENOUS

## 2023-10-28 MED ORDER — BUTAMBEN-TETRACAINE-BENZOCAINE 2-2-14 % EX AERO
INHALATION_SPRAY | CUTANEOUS | Status: DC | PRN
Start: 2023-10-28 — End: 2023-10-28
  Administered 2023-10-28: 1 via TOPICAL

## 2023-10-28 MED ORDER — PROPOFOL 500 MG/50ML IV EMUL
INTRAVENOUS | Status: DC | PRN
  Administered 2023-10-28: 100 ug/kg/min via INTRAVENOUS

## 2023-10-28 MED ORDER — SODIUM CHLORIDE 0.9 % IV SOLN: INTRAVENOUS | Status: DC

## 2023-10-28 NOTE — CV Procedure (Signed)
   Transesophageal Echocardiogram  Indications: Mitral valve regurgitation  Time out performed  During this procedure the patient was administered propofol under anesthesiology supervision to achieve and maintain moderate sedation.  The patient's heart rate, blood pressure, and oxygen saturation are monitored continuously during the procedure.   Findings:  Left Ventricle: Ejection fraction 50 to 55%, low normal  Mitral Valve: Appears normal in structure.  Moderate eccentric mitral valve regurgitation noted with Pisa radius of 0.6 cm coinciding with transthoracic echocardiogram.  Coanda effect noted, wrapping around atrium.  Blunting of the pulmonary vein opposite to the direction of the mitral valve regurgitant jet noted.  3D imaging performed.  Overall mitral regurgitation appears moderate.  Aortic Valve: Trileaflet, no regurgitation, no stenosis  Tricuspid Valve: Trivial regurgitation  Left Atrium: Normal, no left atrial appendage thrombus  Right Atrium: Normal  Intraatrial septum: Mildly redundant.  Bubble study performed, no PFO  Impression: Moderate mitral valve regurgitation.  Continue to monitor via trans thoracic echocardiogram.  Donato Schultz, MD

## 2023-10-28 NOTE — Anesthesia Preprocedure Evaluation (Addendum)
Anesthesia Evaluation  Patient identified by MRN, date of birth, ID band Patient awake    Reviewed: Allergy & Precautions, NPO status , Patient's Chart, lab work & pertinent test results  History of Anesthesia Complications (+) PONV and history of anesthetic complications  Airway Mallampati: III  TM Distance: >3 FB Neck ROM: Full    Dental  (+) Dental Advisory Given, Teeth Intact,    Pulmonary neg shortness of breath, neg sleep apnea, neg COPD, neg recent URI, former smoker   breath sounds clear to auscultation       Cardiovascular + Valvular Problems/Murmurs MR  Rhythm:Regular   1. MR at least moderate; mechanism unclear; suggest TEE to further assess  valve morphology and severity of MR.   2. Left ventricular ejection fraction, by estimation, is 50 to 55%. The  left ventricle has low normal function. The left ventricle has no regional  wall motion abnormalities. The left ventricular internal cavity size was  mildly dilated. Left ventricular  diastolic parameters were normal.   3. Right ventricular systolic function is normal. The right ventricular  size is normal.   4. The mitral valve is normal in structure. Moderate mitral valve  regurgitation. No evidence of mitral stenosis.   5. The aortic valve is tricuspid. Aortic valve regurgitation is not  visualized. No aortic stenosis is present.   6. The inferior vena cava is normal in size with greater than 50%  respiratory variability, suggesting right atrial pressure of 3 mmHg.     Neuro/Psych neg Seizures PSYCHIATRIC DISORDERS Anxiety      Neuromuscular disease    GI/Hepatic Neg liver ROS,GERD  Medicated and Controlled,,  Endo/Other  negative endocrine ROS    Renal/GU negative Renal ROS     Musculoskeletal  (+) Arthritis ,    Abdominal   Peds  Hematology negative hematology ROS (+)   Anesthesia Other Findings   Reproductive/Obstetrics                              Anesthesia Physical Anesthesia Plan  ASA: 2  Anesthesia Plan: MAC   Post-op Pain Management: Minimal or no pain anticipated   Induction: Intravenous  PONV Risk Score and Plan: 3 and Propofol infusion and Treatment may vary due to age or medical condition  Airway Management Planned: Nasal Cannula, Natural Airway and Simple Face Mask  Additional Equipment: None  Intra-op Plan:   Post-operative Plan:   Informed Consent: I have reviewed the patients History and Physical, chart, labs and discussed the procedure including the risks, benefits and alternatives for the proposed anesthesia with the patient or authorized representative who has indicated his/her understanding and acceptance.     Dental advisory given  Plan Discussed with: CRNA  Anesthesia Plan Comments:        Anesthesia Quick Evaluation

## 2023-10-28 NOTE — H&P (Signed)
PCP:  Judy Pimple, MD     Cardiologist:   Dietrich Pates, MD    Pt presents for follow up of CP and palpitatons       History of Present Illness: [name] [name] is a 73 y.o. female with a history of palpitations and CP  Also a history of GERD   THe pt was last seen in cardiology in 2016 Myovew in 2016   was normal  Echo in 2016 showed LVEF normal  ? Wall motion abnormalities laterally     The pt notes occasionally she gets chest tightness  NOt associated with activity Does not take atenolol often  Worse if drinks caffeine    She says she is less active due to arthritis in knees   Diet:   Oatmeal/ eggs / sausage biscut  / protein shck Lunch     2 or 3 PM   Bigger meal Dinner    Small meal     Active Medications  No outpatient medications have been marked as taking for the 09/02/23 encounter (Office Visit) with Pricilla Riffle, MD.         Allergies:   Amoxicillin, Codeine, Macrobid [nitrofurantoin], Clarithromycin, and Trazodone and nefazodone    Past Medical History: Diagnosis Date  Acute meniscal tear of knee    Arthritis     "neck; left knee before replacement" (05/10/2015)  Basal cell cancer 02/2007  Breast cyst     "left"  Chest pain 06/2010   [facility]) and palpitations - ruled out for MI with nl. Echo  Complication of anesthesia HARD TO WAKE  Difficulty sleeping    Eczema    GERD (gastroesophageal reflux disease)    Heart palpitations OCCASIONAL--  TAKES ATENOLOL PRN  Hiatal hernia    HSV-2 infection     buttocks  Hyperlipidemia DIET CONTROL  Melanoma of skin (HCC)    MVP (mitral valve prolapse)     per pt no treatment needed  Normal nuclear stress test 07/09/2010  Osteopenia 12/2013   T score -1.6 FRAX 16%/0.8%  PONV (postoperative nausea and vomiting)    Sleep disorder 09/12/2017        Past Surgical History: Procedure Laterality Date  Carotid US   9/05   Mild, recheck 1 year  Carotid US    10/06   Normal  CHONDROPLASTY   10/23/2011   Procedure: CHONDROPLASTY;  Surgeon: Loanne Drilling, MD;  Location: [facility];  Service: Orthopedics;  Laterality: Left;  Left medial    COLONOSCOPY      ESOPHAGOGASTRODUODENOSCOPY   9/07   Normal  HYSTEROSCOPY WITH RESECTOSCOPE   2000   POLPECTOMY'S  JOINT REPLACEMENT      KNEE ARTHROSCOPY   10/23/2011   Procedure: ARTHROSCOPY KNEE;  Surgeon: Loanne Drilling, MD;  Location: [facility];  Service: Orthopedics;  Laterality: Left;  LEFT KNEE SCOPE WITH DEBRIDEMENT   MELANOMA EXCISION Right 1995   "side"  MOHS SURGERY Left     "side of my nose"  MOLE REMOVAL       "several; all over my body"  MRI neck   3/00   C4-C5 herniation small stenosis mild C4-5, C5-6  TONSILLECTOMY      TOTAL KNEE ARTHROPLASTY Left 11/21/2014   Procedure: LEFT TOTAL KNEE ARTHROPLASTY;  Surgeon: Loanne Drilling, MD;  Location: [facility];  Service: Orthopedics;  Laterality: Left;  TRANSTHORACIC ECHOCARDIOGRAM   06-29-2010   NORMAL LVSF, EF 55-60%,  MILD MR  TUBAL  LIGATION   1989  VAGINAL HYSTERECTOMY   2001   complex hyperplasia with mother's history of uterine cancer          Social History:  The patient  reports that she quit smoking about 44 years ago. Her smoking use included cigarettes. She started smoking about 59 years ago. She has a 15 pack-year smoking history. She has never been exposed to tobacco smoke. She has never used smokeless tobacco. She reports that she does not drink alcohol and does not use drugs.    Family History:  The patient's family history includes Alcohol abuse in her father; Cancer in her mother; Diabetes in her maternal grandmother and mother; Emphysema in her father; Heart disease in her father, paternal aunt, and paternal uncle; Obesity in her mother; Osteoarthritis in her maternal aunt, mother, and sister; Thrombocytopenia in her mother.      ROS:   Please see the history of present illness. All other systems are reviewed and  Negative to the above problem except as noted.      PHYSICAL EXAM: VS:  BP (!) 122/90 (BP Location: Left Arm, Patient Position: Sitting, Cuff Size: Normal)   Pulse 80   Ht 5\' 5"  (1.651 m)   Wt 184 lb 12.8 oz (83.8 kg)   SpO2 96%   BMI 30.75 kg/m   GEN: Obese 73 yo  in no acute distress  HEENT: normal  Neck: no JVD, carotid bruits Cardiac: RRR; no murmur  No LE edema  Respiratory:  clear to auscultation  No hepatomegaly  MS: no deformity Moving all extremities       EKG:  EKG is ordered today.  SR 60 bpm     Lipid Panel Labs (Brief)          Wt Readings from Last 3 Encounters: 09/02/23 184 lb 12.8 oz (83.8 kg) 08/19/23 185 lb (83.9 kg) 05/22/23 183 lb (83 kg)       ASSESSMENT AND PLAN:   1  palpitations   Infrequent   Will get 1 wk monitor to evaluate burden   Willl also get echo to reevluate LVEF and all motion.   2  chest tightness,   The pt has intermitt tightness   not associated with aactivity  REpeat echo       3    HCM   Last lipids In Jun 2027  LDL 107  HDL 49  Reviewed diet    Current medicines are reviewed at length with the patient today.  The patient does not have concerns regarding medicines.   Patient's echocardiogram demonstrated what appears to be moderate to severe mitral regurgitation.  Dr. Tenny Craw described this to her via telephone.  TEE is being accomplished today.  Risks and benefits including esophageal damage have been discussed.  Donato Schultz, MD

## 2023-10-28 NOTE — Anesthesia Postprocedure Evaluation (Signed)
Anesthesia Post Note  Patient: [name] [name]  Procedure(s) Performed: TRANSESOPHAGEAL ECHOCARDIOGRAM     Patient location during evaluation: Cath Lab Anesthesia Type: MAC Level of consciousness: awake and alert Pain management: pain level controlled Vital Signs Assessment: post-procedure vital signs reviewed and stable Respiratory status: spontaneous breathing, nonlabored ventilation and respiratory function stable Cardiovascular status: stable and blood pressure returned to baseline Postop Assessment: no apparent nausea or vomiting Anesthetic complications: no   No notable events documented.  Last Vitals:  Vitals:   10/28/23 1020 10/28/23 1030  BP: (!) 127/96 (!) 121/96  Pulse: 65 62  Resp: 12 17  Temp:    SpO2: 93% 100%    Last Pain:  Vitals:   10/28/23 1008  PainSc: 0-No pain                 Joahan Swatzell

## 2023-10-28 NOTE — Transfer of Care (Signed)
Immediate Anesthesia Transfer of Care Note  Patient: [name] [name]  Procedure(s) Performed: TRANSESOPHAGEAL ECHOCARDIOGRAM  Patient Location: PACU and Cath Lab  Anesthesia Type:MAC  Level of Consciousness: awake, alert , and oriented  Airway & Oxygen Therapy: Patient Spontanous Breathing  Post-op Assessment: Report given to RN and Post -op Vital signs reviewed and stable  Post vital signs: Reviewed and stable  Last Vitals:  Vitals Value Taken Time  BP    Temp    Pulse    Resp    SpO2      Last Pain: There were no vitals filed for this visit.       Complications: No notable events documented.

## 2023-10-29 ENCOUNTER — Encounter (HOSPITAL_COMMUNITY): Payer: Self-pay | Admitting: Cardiology

## 2023-10-31 NOTE — Telephone Encounter (Signed)
Left VM with pt   Echo showed mitral regurgitation moderate  Would set up for TTE next December  Follow up in clinic after

## 2023-11-03 NOTE — Telephone Encounter (Addendum)
Echo ordered for Dec 2025 and recall placed for OV.  Pt advised.

## 2023-11-20 DIAGNOSIS — H35371 Puckering of macula, right eye: Secondary | ICD-10-CM | POA: Diagnosis not present

## 2023-11-25 ENCOUNTER — Encounter: Payer: Self-pay | Admitting: Family Medicine

## 2023-11-25 ENCOUNTER — Ambulatory Visit (INDEPENDENT_AMBULATORY_CARE_PROVIDER_SITE_OTHER): Payer: PPO | Admitting: Family Medicine

## 2023-11-25 VITALS — BP 122/78 | HR 76 | Temp 98.5°F | Ht 65.0 in | Wt 185.4 lb

## 2023-11-25 DIAGNOSIS — R1013 Epigastric pain: Secondary | ICD-10-CM

## 2023-11-25 DIAGNOSIS — K449 Diaphragmatic hernia without obstruction or gangrene: Secondary | ICD-10-CM

## 2023-11-25 DIAGNOSIS — K219 Gastro-esophageal reflux disease without esophagitis: Secondary | ICD-10-CM | POA: Diagnosis not present

## 2023-11-25 LAB — HEPATIC FUNCTION PANEL
ALT: 17 U/L (ref 0–35)
AST: 23 U/L (ref 0–37)
Albumin: 4 g/dL (ref 3.5–5.2)
Alkaline Phosphatase: 73 U/L (ref 39–117)
Bilirubin, Direct: 0.1 mg/dL (ref 0.0–0.3)
Total Bilirubin: 0.7 mg/dL (ref 0.2–1.2)
Total Protein: 6.7 g/dL (ref 6.0–8.3)

## 2023-11-25 LAB — CBC WITH DIFFERENTIAL/PLATELET
Basophils Absolute: 0 10*3/uL (ref 0.0–0.1)
Basophils Relative: 0.6 % (ref 0.0–3.0)
Eosinophils Absolute: 0.1 10*3/uL (ref 0.0–0.7)
Eosinophils Relative: 1.3 % (ref 0.0–5.0)
HCT: 44.2 % (ref 36.0–46.0)
Hemoglobin: 15 g/dL (ref 12.0–15.0)
Lymphocytes Relative: 33.1 % (ref 12.0–46.0)
Lymphs Abs: 1.9 10*3/uL (ref 0.7–4.0)
MCHC: 34 g/dL (ref 30.0–36.0)
MCV: 96.3 fL (ref 78.0–100.0)
Monocytes Absolute: 0.4 10*3/uL (ref 0.1–1.0)
Monocytes Relative: 7.2 % (ref 3.0–12.0)
Neutro Abs: 3.3 10*3/uL (ref 1.4–7.7)
Neutrophils Relative %: 57.8 % (ref 43.0–77.0)
Platelets: 210 10*3/uL (ref 150.0–400.0)
RBC: 4.59 Mil/uL (ref 3.87–5.11)
RDW: 13.2 % (ref 11.5–15.5)
WBC: 5.7 10*3/uL (ref 4.0–10.5)

## 2023-11-25 LAB — BASIC METABOLIC PANEL
BUN: 16 mg/dL (ref 6–23)
CO2: 29 meq/L (ref 19–32)
Calcium: 9.4 mg/dL (ref 8.4–10.5)
Chloride: 105 meq/L (ref 96–112)
Creatinine, Ser: 0.73 mg/dL (ref 0.40–1.20)
GFR: 82.6 mL/min (ref >60.00)
Glucose, Bld: 86 mg/dL (ref 70–99)
Potassium: 4.1 meq/L (ref 3.5–5.1)
Sodium: 140 meq/L (ref 135–145)

## 2023-11-25 LAB — LIPASE: Lipase: 26 U/L (ref 11.0–59.0)

## 2023-11-25 MED ORDER — ESOMEPRAZOLE MAGNESIUM 40 MG PO CPDR: 40.0000 mg | DELAYED_RELEASE_CAPSULE | Freq: Every day | ORAL | 3 refills | Status: DC

## 2023-11-25 NOTE — Progress Notes (Signed)
 Subjective:    Patient ID: [name] [name], female    DOB: 12-Apr-1951, 73 y.o.   MRN: [id number]  HPI  Wt Readings from Last 3 Encounters:  11/25/23 185 lb 6 oz (84.1 kg)  10/28/23 180 lb (81.6 kg)  09/02/23 184 lb 12.8 oz (83.8 kg)   30.85 kg/m  Vitals:   11/25/23 1154  BP: 122/78  Pulse: 76  Temp: 98.5 F (36.9 C)  SpO2: 97%    Pt presents with acid reflux symptoms   Cough- well over a year but worse lately  Gets choked when swallowing  Some phlegm  Burping a lot  Feels like a glob of mucus in throat  Some nausea  Makes it hard to sleep  Keeps clearing her throat   A little epigastric pain and also under left breast  Sharp at times   Eating makes it worse  ? No particular trigger     Cut back on Am coffee Vitamins    Past history of GERD with HH  Nexium 20 mg daily in am Pepcid 20 mg daily  at night    Had EGD in 07 Normal    Recent dx of mod to severe mitral regurg  TEE done  May have worsened esophageal symptoms ?    Patient Active Problem List   Diagnosis Date Noted   Right leg pain 03/14/2023   Epigastric pain 12/14/2021   Adverse effect of proton pump inhibitor 12/14/2021   Anxious mood 09/24/2017   Sleep disorder 09/12/2017   Palpitations 05/22/2015   OA (osteoarthritis) of knee 11/21/2014   Colon cancer screening 11/29/2013   Routine general medical examination at a health care facility 09/16/2011   DERMATITIS, ATOPIC 11/27/2010   URTICARIA DUE TO COLD OR HEAT 11/27/2010   EXTERNAL HEMORRHOIDS WITHOUT MENTION COMP 06/25/2010   ABNORMAL EKG 12/26/2009   OTHER CHRONIC SINUSITIS 10/19/2009   Vitamin D deficiency 04/21/2009   ALLERGIC RHINITIS 11/09/2008   GERD 07/28/2008   Osteopenia 03/14/2008   HYPERCHOLESTEROLEMIA, PURE 03/13/2007   HIATAL HERNIA 01/12/2007   Past Medical History:  Diagnosis Date   Acute meniscal tear of knee    Arthritis    "neck; left knee before replacement" (05/10/2015)   Basal cell cancer  02/2007   Breast cyst    "left"   Chest pain 06/2010   [facility]) and palpitations - ruled out for MI with nl. Echo   Complication of anesthesia HARD TO WAKE   Difficulty sleeping    Eczema    GERD (gastroesophageal reflux disease)    Heart palpitations OCCASIONAL--  TAKES ATENOLOL PRN   Hiatal hernia    HSV-2 infection    buttocks   Hyperlipidemia DIET CONTROL   Melanoma of skin (HCC)    MVP (mitral valve prolapse)    per pt no treatment needed   Normal nuclear stress test 07/09/2010   Osteopenia 12/2013   T score -1.6 FRAX 16%/0.8%   PONV (postoperative nausea and vomiting)    Sleep disorder 09/12/2017   Past Surgical History:  Procedure Laterality Date   Carotid US  9/05   Mild, recheck 1 year   Carotid US  10/06   Normal   CHONDROPLASTY  10/23/2011   Procedure: CHONDROPLASTY;  Surgeon: Loanne Drilling, MD;  Location: [facility] [place];  Service: Orthopedics;  Laterality: Left;  Left medial    COLONOSCOPY     ESOPHAGOGASTRODUODENOSCOPY  9/07   Normal   HYSTEROSCOPY WITH RESECTOSCOPE  2000  POLPECTOMY'S   JOINT REPLACEMENT     KNEE ARTHROSCOPY  10/23/2011   Procedure: ARTHROSCOPY KNEE;  Surgeon: Loanne Drilling, MD;  Location: [facility];  Service: Orthopedics;  Laterality: Left;  LEFT KNEE SCOPE WITH DEBRIDEMENT    MELANOMA EXCISION Right 1995   "side"   MOHS SURGERY Left    "side of my nose"   MOLE REMOVAL     "several; all over my body"   MRI neck  3/00   C4-C5 herniation small stenosis mild C4-5, C5-6   TONSILLECTOMY     TOTAL KNEE ARTHROPLASTY Left 11/21/2014   Procedure: LEFT TOTAL KNEE ARTHROPLASTY;  Surgeon: Loanne Drilling, MD;  Location: [facility];  Service: Orthopedics;  Laterality: Left;   TRANSESOPHAGEAL ECHOCARDIOGRAM (CATH LAB) N/A 10/28/2023   Procedure: TRANSESOPHAGEAL ECHOCARDIOGRAM;  Surgeon: Jake Bathe, MD;  Location: [facility];  Service: Cardiovascular;  Laterality: N/A;   TRANSTHORACIC ECHOCARDIOGRAM  06-29-2010    NORMAL LVSF, EF 55-60%,  MILD MR   TUBAL LIGATION  1989   VAGINAL HYSTERECTOMY  2001   complex hyperplasia with mother's history of uterine cancer   Social History   Tobacco Use   Smoking status: Former    Current packs/day: 0.00    Average packs/day: 1 pack/day for 15.0 years (15.0 ttl pk-yrs)    Types: Cigarettes    Start date: 10/08/1963    Quit date: 10/07/1978    Years since quitting: 45.1    Passive exposure: Never   Smokeless tobacco: Never  Vaping Use   Vaping status: Never Used  Substance Use Topics   Alcohol use: No    Alcohol/week: 0.0 standard drinks of alcohol   Drug use: No   Family History  Problem Relation Age of Onset   Cancer Mother        uterine   Obesity Mother    Thrombocytopenia Mother    Diabetes Mother    Osteoarthritis Mother    Alcohol abuse Father    Heart disease Father        CAD in 2's   Emphysema Father    Diabetes Maternal Grandmother        type 2   Heart disease Paternal Aunt        CAD   Heart disease Paternal Uncle        CAD   Osteoarthritis Sister    Osteoarthritis Maternal Aunt    Colon cancer Neg Hx    Breast cancer Neg Hx    Allergies  Allergen Reactions   Amoxicillin Rash   Codeine Rash   Macrobid [Nitrofurantoin]     GI issues   Clarithromycin Other (See Comments)    reaction not known   Trazodone And Nefazodone Palpitations   Current Outpatient Medications on File Prior to Visit  Medication Sig Dispense Refill   B Complex-C (SUPER B COMPLEX PO) Take 1 capsule by mouth daily.     Bioflavonoid Products (ESTER C PO) Take 500 mg by mouth daily.     Calcium Citrate-Vitamin D (CALCIUM + D PO) Take 1 tablet by mouth daily.     Cholecalciferol (VITAMIN D) 50 MCG (2000 UT) tablet Take 2,000 Units by mouth daily.     CINNAMON PO Take 1 capsule by mouth daily.     diclofenac sodium (VOLTAREN) 1 % GEL APPLY 4 GRAMS TO THE AFFECTED AREA 3 TIMES DAILY AS NEEDED 100 g 3   ELDERBERRY PO Take 1 tablet by mouth daily.  escitalopram (LEXAPRO) 10 MG tablet Take 0.5 tablets (5 mg total) by mouth daily. (Patient taking differently: Take 5 mg by mouth at bedtime.) 45 tablet 3   famotidine (PEPCID) 20 MG tablet TAKE 1 TABLET BY MOUTH TWICE A DAY (Patient taking differently: Take 20 mg by mouth daily.) 180 tablet 2   Garlic 300 MG CAPS Take 300 mg by mouth daily.     Magnesium 200 MG TABS Take 200 mg by mouth 2 (two) times a week.     Multiple Vitamin (MULTIVITAMIN) capsule Take 2 capsules by mouth daily.     Omega-3 Fatty Acids (OMEGA-3 FISH OIL) 300 MG CAPS Take 300 mg by mouth every other day.     Polyethylene Glycol 400 (BLINK TEARS OP) Place 1 drop into both eyes daily.     vitamin B-12 (CYANOCOBALAMIN) 500 MCG tablet Take 500 mcg by mouth daily.     No current facility-administered medications on file prior to visit.    Review of Systems  Constitutional:  Negative for activity change, appetite change, fatigue, fever and unexpected weight change.  HENT:  Negative for congestion, ear pain, rhinorrhea, sinus pressure, sore throat, trouble swallowing and voice change.        Constant throat clearing  Able to swallow   Eyes:  Negative for pain, redness and visual disturbance.  Respiratory:  Positive for cough. Negative for shortness of breath and wheezing.   Cardiovascular:  Negative for chest pain and palpitations.  Gastrointestinal:  Positive for abdominal pain and nausea. Negative for abdominal distention, anal bleeding, blood in stool, constipation, diarrhea, rectal pain and vomiting.       Belching   Endocrine: Negative for polydipsia and polyuria.  Genitourinary:  Negative for dysuria, frequency and urgency.  Musculoskeletal:  Negative for arthralgias, back pain and myalgias.  Skin:  Negative for pallor and rash.  Allergic/Immunologic: Negative for environmental allergies.  Neurological:  Negative for dizziness, syncope and headaches.  Hematological:  Negative for adenopathy. Does not bruise/bleed  easily.  Psychiatric/Behavioral:  Negative for decreased concentration and dysphoric mood. The patient is not nervous/anxious.        Objective:   Physical Exam Constitutional:      General: She is not in acute distress.    Appearance: Normal appearance. She is well-developed. She is obese. She is not ill-appearing or diaphoretic.  HENT:     Head: Normocephalic and atraumatic.     Mouth/Throat:     Mouth: Mucous membranes are moist.  Eyes:     General: No scleral icterus.    Conjunctiva/sclera: Conjunctivae normal.     Pupils: Pupils are equal, round, and reactive to light.  Cardiovascular:     Rate and Rhythm: Normal rate and regular rhythm.     Heart sounds: Normal heart sounds.  Pulmonary:     Effort: Pulmonary effort is normal. No respiratory distress.     Breath sounds: Normal breath sounds. No stridor. No wheezing, rhonchi or rales.  Abdominal:     General: Abdomen is protuberant. Bowel sounds are normal. There is no distension.     Palpations: Abdomen is soft. There is no hepatomegaly, splenomegaly, mass or pulsatile mass.     Tenderness: There is abdominal tenderness in the epigastric area. There is no right CVA tenderness, left CVA tenderness, guarding or rebound. Negative signs include Murphy's sign.  Musculoskeletal:     Cervical back: Normal range of motion and neck supple.  Lymphadenopathy:     Cervical: No cervical adenopathy.  Skin:  General: Skin is warm and dry.     Coloration: Skin is not pale.     Findings: No erythema.  Neurological:     Mental Status: She is alert.     Cranial Nerves: No cranial nerve deficit.  Psychiatric:        Mood and Affect: Mood normal.           Assessment & Plan:   Problem List Items Addressed This Visit       Digestive   GERD - Primary   With globus sensation in throat  Over a year/ now possibly worse after TEE  Reassuring exam  Will increase nexium to 40 mg daily in am Continue pepcid 20 mg in evening    Lab for this and epigastric pain today      Relevant Medications   esomeprazole (NEXIUM) 40 MG capsule     Other   Epigastric pain   At times sharp in nature Worse with eating some things/ but occational bland food like bread will improve it   Lab today   Did increase nexium for GERd to 40 mg each am Pepcid 20 mg in evening   Pending result for plan      Relevant Orders   CBC with Differential/Platelet   Hepatic function panel   Lipase   Basic Metabolic Panel

## 2023-11-25 NOTE — Patient Instructions (Addendum)
 Increase nexium to 40 mg daily in am  I sent in prescription  Continue the pepcid (famotidine) in the evening   Avoid triggering foods  Avoid caffeine  Avoid high acid things like coffee or citrus juices   Elevate the head of your bed if you can   Labs today    If symptoms worsen in the meantime let us know  If severe go to the ER

## 2023-11-25 NOTE — Assessment & Plan Note (Signed)
 At times sharp in nature Worse with eating some things/ but occational bland food like bread will improve it   Lab today   Did increase nexium for GERd to 40 mg each am Pepcid 20 mg in evening   Pending result for plan

## 2023-11-25 NOTE — Assessment & Plan Note (Signed)
 With globus sensation in throat  Over a year/ now possibly worse after TEE  Reassuring exam  Will increase nexium to 40 mg daily in am Continue pepcid 20 mg in evening   Lab for this and epigastric pain today

## 2023-12-02 ENCOUNTER — Telehealth: Payer: Self-pay | Admitting: Internal Medicine

## 2023-12-02 NOTE — Addendum Note (Signed)
 Addended by: Roxy Manns A on: 12/02/2023 08:43 PM   Modules accepted: Orders

## 2023-12-02 NOTE — Telephone Encounter (Signed)
 Copied from CRM [id number]. Topic: General - Other >> Dec 02, 2023  9:41 AM [name] [name] wrote: Reason for CRM: Patient was seen on 11/25/2023 and Dr.Tower asked patient to call back to see how she was doing, Patient is not doing any better, still has a pain from her stomach going through her chest. She would like to see the GI specialist as discussed or speak with Dr.Tower or the nurse.

## 2023-12-02 NOTE — Telephone Encounter (Signed)
 Patient called and stated that she has been experiencing a lot of Burping, indigestion,cough and a hiatal hernia. Patient stated that her PCP was given her 40 MG of Nexium and 20 MG of Pepcid for 1 week and has not noticed a difference.  Patient is requesting a call back.Please advise.

## 2023-12-03 NOTE — Telephone Encounter (Signed)
 Pt stated that she has been having an increase in reflux that seems to be getting severe. Recently seen by her PCP who prescribed some medications for her although they are not bringing relief. Pt requesting to see a provider. Pt scheduled to see Quentin Mulling PA on 12/11/2023 at 1:30 ( 7 day Hold) Pt made aware. Pt verbalized understanding with all questions answered.

## 2023-12-03 NOTE — Telephone Encounter (Signed)
 Inbound call from patient, following up on call below. States her PCP also sent over a referral for her to be seen as soon as possible for Worsening acid reflux symptoms with globus sensation / throat clearing and belching Also increasing epigastric discomfort that radiates into the chest  Have gone up on ppi with no improvement Also takes H2 blocker.

## 2023-12-10 NOTE — Progress Notes (Addendum)
 12/11/2023 [name] [name] [id number] 03/15/1951  Referring provider: Judy Pimple, MD Primary GI doctor: Dr. Leone Payor  ASSESSMENT AND PLAN:   Eructation, nausea, clearing throat no dysphagia EGD 2007 with Dr. Leone Payor unremarkable No abdominal imaging Tried nexium 40 mg in the morning and pepcid in the pm, stopped the nexium Monday due to worsening symptoms, continued on pepcid 20 mg BID, she states nausea improved but she continues to have mucus clear to white, no SOB, wheezing Potentially worse after recent TEE 01/27 Stopped lexapro and benadryl  Likely LPR, will check AB [place] with family history and pain on palpation, continue pepcid for now, pending EGD can start on PPI but not nexium.  EGD at [facility], I discussed risks of EGD with patient today, including risk of sedation, bleeding or perforation.  Patient provides understanding and gave verbal consent to proceed. If EGD and [place] negative, consider ENT referral versus baclofen trial versus barium Given information about LPR, try to prevent cough/clearing with sugar free drops  Moderate mitral regurgitation Recent TEE 11/03/2023  normal ejection fraction no aortic stenosis No SOB, no leg swelling, should be appropriate for LEC  Unremarkable screening colonoscopy Recall 2026    Patient Care Team: Tower, Audrie Gallus, MD as PCP - General  HISTORY OF PRESENT ILLNESS: Discussed the use of AI scribe software for clinical note transcription with the patient, who gave verbal consent to proceed.  History of Present Illness   [name] [name] is a 73 year old female with GERD who presents with worsening reflux symptoms. She was referred by her previous doctor for evaluation of her reflux symptoms.  She experiences worsening reflux symptoms, including persistent nausea and frequent burping, which began after starting a prescription for 40 mg Nexium and one Pepcid in the afternoon. Despite these medications, her symptoms worsened, leading  her to discontinue Nexium and return to taking Pepcid twice daily. Nausea subsided after stopping Lexapro and Benadryl, but reflux symptoms persisted. She describes frequent burping and a sensation of phlegm in her throat, sometimes leading to severe coughing episodes. The phlegm is sometimes clear and sometimes milky. Symptoms are exacerbated by eating or drinking and somewhat relieved by lying on her right side and taking deep breaths. No acid taste, shortness of breath, or wheezing, but occasional epigastric discomfort and soreness, particularly after prolonged episodes of burping.  Her past medical history includes GERD, hiatal hernia, chronic sinusitis, external hemorrhoids, and hyperlipidemia. She has a history of a colonoscopy in August 2022, which was unremarkable, and an echocardiogram in January 2025, which showed moderate mitral regurgitation with a normal ejection fraction. She has been taking Pepcid 20 mg twice daily and previously used Nexium and over-the-counter Nexium without significant relief.  She has made dietary changes, eliminating caffeine, chocolate, juice, and vitamins, without improvement in her symptoms.  She reports a family history of gallbladder issues, as her daughter has gallstones.      She  reports that she quit smoking about 45 years ago. Her smoking use included cigarettes. She started smoking about 60 years ago. She has a 15 pack-year smoking history. She has never been exposed to tobacco smoke. She has never used smokeless tobacco. She reports that she does not drink alcohol and does not use drugs.  RELEVANT GI HISTORY, IMAGING AND LABS: Results   LABS Lipase: Normal Liver function tests: Normal  DIAGNOSTIC Colonoscopy: Unremarkable (05/29/2015) Echocardiogram: Moderate mitral valve regurgitation, ejection fraction 55%, no aortic stenosis (10/17/2023) Transesophageal Echocardiogram (TEE): Moderate mitral  regurgitation, ejection fraction 50-55%, normal RVSF,  PISA 0.6 (11/03/2023)      CBC    Component Value Date/Time   WBC 5.7 11/25/2023 1227   RBC 4.59 11/25/2023 1227   HGB 15.0 11/25/2023 1227   HGB 15.3 10/23/2023 1429   HCT 44.2 11/25/2023 1227   HCT 44.9 10/23/2023 1429   PLT 210.0 11/25/2023 1227   PLT 208 10/23/2023 1429   MCV 96.3 11/25/2023 1227   MCV 97 10/23/2023 1429   MCH 33.1 (H) 10/23/2023 1429   MCH 33.5 09/06/2017 1309   MCHC 34.0 11/25/2023 1227   RDW 13.2 11/25/2023 1227   RDW 12.3 10/23/2023 1429   LYMPHSABS 1.9 11/25/2023 1227   MONOABS 0.4 11/25/2023 1227   EOSABS 0.1 11/25/2023 1227   BASOSABS 0.0 11/25/2023 1227   Recent Labs    03/14/23 0918 10/23/23 1429 11/25/23 1227  HGB 14.7 15.3 15.0    CMP     Component Value Date/Time   NA 140 11/25/2023 1227   NA 143 10/23/2023 1429   K 4.1 11/25/2023 1227   CL 105 11/25/2023 1227   CO2 29 11/25/2023 1227   GLUCOSE 86 11/25/2023 1227   BUN 16 11/25/2023 1227   BUN 18 10/23/2023 1429   CREATININE 0.73 11/25/2023 1227   CREATININE 0.81 04/19/2015 1738   CALCIUM 9.4 11/25/2023 1227   PROT 6.7 11/25/2023 1227   ALBUMIN 4.0 11/25/2023 1227   AST 23 11/25/2023 1227   ALT 17 11/25/2023 1227   ALKPHOS 73 11/25/2023 1227   BILITOT 0.7 11/25/2023 1227   GFRNONAA >60 09/06/2017 1309   GFRAA >60 09/06/2017 1309      Latest Ref Rng & Units 11/25/2023   12:27 PM 03/14/2023    9:18 AM 03/12/2022    9:32 AM  Hepatic Function  Total Protein 6.0 - 8.3 g/dL 6.7  6.6  6.4   Albumin 3.5 - 5.2 g/dL 4.0  4.2  4.1   AST 0 - 37 U/L 23  28  20    ALT 0 - 35 U/L 17  26  21    Alk Phosphatase 39 - 117 U/L 73  79  83   Total Bilirubin 0.2 - 1.2 mg/dL 0.7  0.8  0.8   Bilirubin, Direct 0.0 - 0.3 mg/dL 0.1         Current Medications:       Current Outpatient Medications (Hematological):    vitamin B-12 (CYANOCOBALAMIN) 500 MCG tablet, Take 500 mcg by mouth daily.  Current Outpatient Medications (Other):    B Complex-C (SUPER B COMPLEX PO), Take 1 capsule by  mouth daily.   Bioflavonoid Products (ESTER C PO), Take 500 mg by mouth daily.   Calcium Citrate-Vitamin D (CALCIUM + D PO), Take 1 tablet by mouth daily.   Cholecalciferol (VITAMIN D) 50 MCG (2000 UT) tablet, Take 2,000 Units by mouth daily.   CINNAMON PO, Take 1 capsule by mouth daily.   diclofenac sodium (VOLTAREN) 1 % GEL, APPLY 4 GRAMS TO THE AFFECTED AREA 3 TIMES DAILY AS NEEDED   ELDERBERRY PO, Take 1 tablet by mouth daily.   famotidine (PEPCID) 20 MG tablet, TAKE 1 TABLET BY MOUTH TWICE A DAY (Patient taking differently: Take 20 mg by mouth daily.)   Garlic 300 MG CAPS, Take 300 mg by mouth daily.   Magnesium 200 MG TABS, Take 200 mg by mouth 2 (two) times a week.   Multiple Vitamin (MULTIVITAMIN) capsule, Take 2 capsules by mouth daily.   Omega-3  Fatty Acids (OMEGA-3 FISH OIL) 300 MG CAPS, Take 300 mg by mouth every other day.   Polyethylene Glycol 400 (BLINK TEARS OP), Place 1 drop into both eyes daily.  Medical History:  Past Medical History:  Diagnosis Date   Acute meniscal tear of knee    Arthritis    "neck; left knee before replacement" (05/10/2015)   Basal cell cancer 02/2007   Breast cyst    "left"   Chest pain 06/2010   [facility]) and palpitations - ruled out for MI with nl. Echo   Complication of anesthesia HARD TO WAKE   Difficulty sleeping    Eczema    GERD (gastroesophageal reflux disease)    Heart palpitations OCCASIONAL--  TAKES ATENOLOL PRN   Hiatal hernia    HSV-2 infection    buttocks   Hyperlipidemia DIET CONTROL   Melanoma of skin (HCC)    MVP (mitral valve prolapse)    per pt no treatment needed   Normal nuclear stress test 07/09/2010   Osteopenia 12/2013   T score -1.6 FRAX 16%/0.8%   PONV (postoperative nausea and vomiting)    Sleep disorder 09/12/2017   Allergies:  Allergies  Allergen Reactions   Amoxicillin Rash   Codeine Rash   Macrobid [Nitrofurantoin]     GI issues   Clarithromycin Other (See Comments)    reaction not known   Trazodone  And Nefazodone Palpitations     Surgical History:  She  has a past surgical history that includes MRI neck (3/00); Melanoma excision (Right, 1995); Carotid US (9/05); Carotid US (10/06); Esophagogastroduodenoscopy (9/07); Mole removal; Hysteroscopy with resectoscope (2000); Tubal ligation (1989); Tonsillectomy; transthoracic echocardiogram (06-29-2010); Knee arthroscopy (10/23/2011); Chondroplasty (10/23/2011); Vaginal hysterectomy (2001); Total knee arthroplasty (Left, 11/21/2014); Joint replacement; Mohs surgery (Left); Colonoscopy; and TRANSESOPHAGEAL ECHOCARDIOGRAM (N/A, 10/28/2023). Family History:  Her family history includes Alcohol abuse in her father; Cancer in her mother; Diabetes in her maternal grandmother and mother; Emphysema in her father; Heart disease in her father, paternal aunt, and paternal uncle; Obesity in her mother; Osteoarthritis in her maternal aunt, mother, and sister; Thrombocytopenia in her mother.  REVIEW OF SYSTEMS  : All other systems reviewed and negative except where noted in the History of Present Illness.  PHYSICAL EXAM: BP 118/82 (BP Location: Left Arm, Patient Position: Sitting, Cuff Size: Normal)   Pulse 78   Ht 5' 4.25" (1.632 m)   Wt 186 lb (84.4 kg)   BMI 31.68 kg/m  Physical Exam   GENERAL APPEARANCE: Well nourished, in no apparent distress. HEENT: No cervical lymphadenopathy, unremarkable thyroid, sclerae anicteric, conjunctiva pink. RESPIRATORY: Respiratory effort normal, breath sounds clear to auscultation bilaterally without rales, rhonchi, or wheezing. CARDIO: Regular rate and rhythm with holosystolic murmur, no rubs, or gallops, peripheral pulses intact. ABDOMEN: Soft, non-distended, active bowel sounds in all four quadrants, epigastric tenderness on palpation, RUQ tenderness mild murphy's, no rebound, no mass appreciated. RECTAL: Declines. MUSCULOSKELETAL: Full range of motion, normal gait, without edema. SKIN: Dry, intact without rashes or  lesions. No jaundice. NEURO: Alert, oriented, no focal deficits. PSYCH: Cooperative, normal mood and affect.      Doree Albee, PA-C 2:10 PM

## 2023-12-11 ENCOUNTER — Ambulatory Visit: Admitting: Physician Assistant

## 2023-12-11 ENCOUNTER — Encounter: Payer: Self-pay | Admitting: Internal Medicine

## 2023-12-11 ENCOUNTER — Encounter: Payer: Self-pay | Admitting: Physician Assistant

## 2023-12-11 VITALS — BP 118/82 | HR 78 | Ht 64.25 in | Wt 186.0 lb

## 2023-12-11 DIAGNOSIS — R0989 Other specified symptoms and signs involving the circulatory and respiratory systems: Secondary | ICD-10-CM | POA: Diagnosis not present

## 2023-12-11 DIAGNOSIS — R142 Eructation: Secondary | ICD-10-CM

## 2023-12-11 DIAGNOSIS — R110 Nausea: Secondary | ICD-10-CM | POA: Diagnosis not present

## 2023-12-11 DIAGNOSIS — R1011 Right upper quadrant pain: Secondary | ICD-10-CM

## 2023-12-11 DIAGNOSIS — K219 Gastro-esophageal reflux disease without esophagitis: Secondary | ICD-10-CM

## 2023-12-11 NOTE — Patient Instructions (Signed)
 You have been scheduled for an abdominal ultrasound at [facility] (1st floor of hospital) on 12-16-23 at 10am. Please arrive 30 minutes prior to your appointment for registration. Make certain not to have anything to eat or drink midnight prior to your appointment. Should you need to reschedule your appointment, please contact radiology at [phone]. This test typically takes about 30 minutes to perform. You have been scheduled for an endoscopy. Please follow written instructions given to you at your visit today.  If you use inhalers (even only as needed), please bring them with you on the day of your procedure.  If you take any of the following medications, they will need to be adjusted prior to your procedure:   DO NOT TAKE 7 DAYS PRIOR TO TEST- Trulicity (dulaglutide) Ozempic, Wegovy (semaglutide) Mounjaro (tirzepatide) Bydureon Bcise (exanatide extended release)  DO NOT TAKE 1 DAY PRIOR TO YOUR TEST Rybelsus (semaglutide) Adlyxin (lixisenatide) Victoza (liraglutide) Byetta (exanatide) ___________________________________________________________________________   Silent reflux: Not all heartburn burns...Marland KitchenMarland KitchenMarland Kitchen  What is LPR? Laryngopharyngeal reflux (LPR) or silent reflux is a condition in which acid that is made in the stomach travels up the esophagus (swallowing tube) and gets to the throat. Not everyone with reflux has a lot of heartburn or indigestion. In fact, many people with LPR never have heartburn. This is why LPR is called SILENT REFLUX, and the terms "Silent reflux" and "LPR" are often used interchangeably. Because LPR is silent, it is sometimes difficult to diagnose.  How can you tell if you have LPR?  Chronic hoarseness- Some people have hoarseness that comes and goes throat clearing  Cough It can cause shortness of breath and cause asthma like symptoms. a feeling of a lump in the throat  difficulty swallowing a problem with too much nose and throat  drainage.  Some people will feel their esophagus spasm which feels like their heart beating hard and fast, this will usually be after a meal, at rest, or lying down at night.    How do I treat this? Treatment for LPR should be individualized, and your doctor will suggest the best treatment for you. Generally there are several treatments for LPR: changing habits and diet to reduce reflux,  medications to reduce stomach acid, and  surgery to prevent reflux. Most people with LPR need to modify how and when they eat, as well as take some medication, to get well. Sometimes, nonprescription liquid antacids, such as Maalox, Gelucil and Mylanta are recommended. When used, these antacids should be taken four times each day - one tablespoon one hour after each meal and before bedtime. Dietary and lifestyle changes alone are not often enough to control LPR - medications that reduce stomach acid are also usually needed. These must be prescribed by our doctor.   TIPS FOR REDUCING REFLUX AND LPR Control your LIFE-STYLE and your DIET! If you use tobacco, QUIT.  Smoking makes you reflux. After every cigarette you have some LPR.  Don't wear clothing that is too tight, especially around the waist (trousers, corsets, belts).  Do not lie down just after eating...in fact, do not eat within three hours of bedtime.  You should be on a low-fat diet.  Limit your intake of red meat.  Limit your intake of butter.  Avoid fried foods.  Avoid chocolate  Avoid cheese.  Avoid eggs. Specifically avoid caffeine (especially coffee and tea), soda pop (especially cola) and mints.  Avoid alcoholic beverages, particularly in the evening.   Generally a cough is either  coming from above or from below- so we will treat this OR it can be from irritation/viral cough  To treat the nasal drip: Get on the chlorphenirmine every 6 hours- This medication can make you sleepy but helps with nasal drip- get from over the counter.    Can do a steroid nasal spary 1-2 sparys at night each nostril. Remember to spray each nostril twice towards the outer part of your eye.  Do not sniff but instead pinch your nose and tilt your head back to help the medicine get into your sinuses.  The best time to do this is at bedtime. Stop if you get blurred vision or nose bleeds.   To treat the reflux Will send in prilosec 40 mg to take once in the morning and take prevacid from over the counter at night for 2 weeks- then stop the prilosec and continue the pravacid or famotadine  To stop irritation: Need to STOP the cough Do sugar free candy Do the tessalon drops VOICE REST is VERY important  Go to the ER or call the office if you get any chest pain, shortness of breath, severe  headache, leg swelling.    Common causes of cough OR hoarseness OR sore throat:   Allergies, Viral Infections, Acid Reflux and Bacterial Infections.    Allergies and viral infections cause a cough OR sore throat by post nasal drip and are often worse at night, can also have sneezing, lower grade fevers, clear/yellow mucus. This is best treated with allergy medications or nasal sprays.  Please get on allegra for 1-2 weeks The strongest is allegra or fexafinadine  Cheapest at [employer], [employer], [employer]   Bacterial infections are more severe than allergies or viral infections with fever, teeth pain, fatigue. This can be treated with prednisone and the same over the counter medication and after 7 days can be treated with an antibiotic.   Silent reflux/GERD can cause a cough OR sore throat OR hoarseness WITHOUT heart burn because the esophagus that goes to the stomach and trachea that goes to the lungs are very close and when you lay down the acid can irritate your throat and lungs. This can cause hoarseness, cough, and wheezing. Please stop any alcohol or anti-inflammatories like aleve/advil/ibuprofen and start an over the counter Prilosec or omeprazole 1-2 times daily  before food for 2 weeks, then switch to over the counter zantac/ratinidine or pepcid/famotadine once at night for 2 weeks.    sometimes irritation causes more irritation. Try voice rest, use sugar free cough drops to prevent coughing, and try to stop clearing your throat.   If you ever have a cough that does not go away after trying these things please make a follow up visit for further evaluation or we can refer you to a specialist. Or if you ever have shortness of breath or chest pain go to the ER.

## 2023-12-15 ENCOUNTER — Encounter: Payer: Self-pay | Admitting: Internal Medicine

## 2023-12-15 ENCOUNTER — Ambulatory Visit (AMBULATORY_SURGERY_CENTER): Admitting: Internal Medicine

## 2023-12-15 VITALS — BP 128/68 | HR 65 | Temp 98.6°F | Resp 12 | Ht 64.0 in | Wt 186.0 lb

## 2023-12-15 DIAGNOSIS — R0989 Other specified symptoms and signs involving the circulatory and respiratory systems: Secondary | ICD-10-CM

## 2023-12-15 DIAGNOSIS — K449 Diaphragmatic hernia without obstruction or gangrene: Secondary | ICD-10-CM | POA: Diagnosis not present

## 2023-12-15 DIAGNOSIS — K2100 Gastro-esophageal reflux disease with esophagitis, without bleeding: Secondary | ICD-10-CM | POA: Diagnosis not present

## 2023-12-15 DIAGNOSIS — R053 Chronic cough: Secondary | ICD-10-CM | POA: Diagnosis not present

## 2023-12-15 MED ORDER — SODIUM CHLORIDE 0.9 % IV SOLN: 500.0000 mL | INTRAVENOUS | Status: DC

## 2023-12-15 NOTE — Progress Notes (Signed)
 Pt A/O x 3, gd SR's, pleased with anesthesia, report to RN

## 2023-12-15 NOTE — Patient Instructions (Addendum)
 I do not see signs of acid reflux damage.  You do have a small hiatal hernia.  If your problems were acid reflux they should get better not worse on Nexium.  I recommend you return to Dr. Milinda Antis to determine next steps.  I appreciate the opportunity to care for you. Iva Boop, MD, [facility]  Please read handouts provided. Continue present medications.   YOU HAD AN ENDOSCOPIC PROCEDURE TODAY AT THE [facility]:   Refer to the procedure report that was given to you for any specific questions about what was found during the examination.  If the procedure report does not answer your questions, please call your gastroenterologist to clarify.  If you requested that your care partner not be given the details of your procedure findings, then the procedure report has been included in a sealed envelope for you to review at your convenience later.  YOU SHOULD EXPECT: Some feelings of bloating in the abdomen. Passage of more gas than usual.  Walking can help get rid of the air that was put into your GI tract during the procedure and reduce the bloating. If you had a lower endoscopy (such as a colonoscopy or flexible sigmoidoscopy) you may notice spotting of blood in your stool or on the toilet paper. If you underwent a bowel prep for your procedure, you may not have a normal bowel movement for a few days.  Please Note:  You might notice some irritation and congestion in your nose or some drainage.  This is from the oxygen used during your procedure.  There is no need for concern and it should clear up in a day or so.  SYMPTOMS TO REPORT IMMEDIATELY:  Following upper endoscopy (EGD)  Vomiting of blood or coffee ground material  New chest pain or pain under the shoulder blades  Painful or persistently difficult swallowing  New shortness of breath  Fever of 100F or higher  Black, tarry-looking stools  For urgent or emergent issues, a gastroenterologist can be reached at any hour by  calling [phone]. Do not use MyChart messaging for urgent concerns.    DIET:  We do recommend a small meal at first, but then you may proceed to your regular diet.  Drink plenty of fluids but you should avoid alcoholic beverages for 24 hours.  ACTIVITY:  You should plan to take it easy for the rest of today and you should NOT DRIVE or use heavy machinery until tomorrow (because of the sedation medicines used during the test).    FOLLOW UP: Our staff will call the number listed on your records the next business day following your procedure.  We will call around 7:15- 8:00 am to check on you and address any questions or concerns that you may have regarding the information given to you following your procedure. If we do not reach you, we will leave a message.     If any biopsies were taken you will be contacted by phone or by letter within the next 1-3 weeks.  Please call us at [phone] if you have not heard about the biopsies in 3 weeks.    SIGNATURES/CONFIDENTIALITY: You and/or your care partner have signed paperwork which will be entered into your electronic medical record.  These signatures attest to the fact that that the information above on your After Visit Summary has been reviewed and is understood.  Full responsibility of the confidentiality of this discharge information lies with you and/or your care-partner.

## 2023-12-15 NOTE — Op Note (Signed)
 [facility] Patient Name: [name] [name] Procedure Date: 12/15/2023 10:04 AM MRN: [id number] Endoscopist: Iva Boop , MD, [id number] Age: 73 Referring MD:  Date of Birth: 1951-09-20 Gender: Female Account #: [id number] Procedure:                Upper GI endoscopy Indications:              Possible esophageal reflux symptoms that persist                            despite appropriate therapy, Chronic cough, throat                            clearing and sputum (phlegm) production Medicines:                Monitored Anesthesia Care Procedure:                Pre-Anesthesia Assessment:                           - Prior to the procedure, a History and Physical                            was performed, and patient medications and                            allergies were reviewed. The patient's tolerance of                            previous anesthesia was also reviewed. The risks                            and benefits of the procedure and the sedation                            options and risks were discussed with the patient.                            All questions were answered, and informed consent                            was obtained. Prior Anticoagulants: The patient has                            taken no anticoagulant or antiplatelet agents. ASA                            Grade Assessment: II - A patient with mild systemic                            disease. After reviewing the risks and benefits,                            the patient was deemed in satisfactory condition to  undergo the procedure.                           After obtaining informed consent, the endoscope was                            passed under direct vision. Throughout the                            procedure, the patient's blood pressure, pulse, and                            oxygen saturations were monitored continuously. The                            GIF [id number]  #[id number] was introduced through the                            mouth, and advanced to the second part of duodenum.                            The upper GI endoscopy was accomplished without                            difficulty. The patient tolerated the procedure                            well. Scope In: Scope Out: Findings:                 The examined esophagus was normal.                           A 3 cm hiatal hernia was present.                           The gastroesophageal flap valve was visualized                            endoscopically and classified as Hill Grade II                            (fold present, opens with respiration).                           The exam was otherwise without abnormality.                           The cardia and gastric fundus were otherwise normal                            on retroflexion. Complications:            No immediate complications. Estimated Blood Loss:     Estimated blood loss: none. Impression:               - Normal esophagus.                           -  3 cm hiatal hernia.                           - Gastroesophageal flap valve classified as Hill                            Grade II (fold present, opens with respiration).                           - The examination was otherwise normal. No signs of                            GERD on this exam                           - No specimens collected. Recommendation:           - Patient has a contact number available for                            emergencies. The signs and symptoms of potential                            delayed complications were discussed with the                            patient. Return to normal activities tomorrow.                            Written discharge instructions were provided to the                            patient.                           - Resume previous diet.                           - Continue present medications.                            - I very much doubt GERD as a cause of cough,                            throat-clearing and sputum production. She                            deterioriated w/ Nexium, reportedly. Recommend                            return to PCP - ? needs ENT and/or pulmonary or                            trial of oral steroid inhaler prior to another  referral. Iva Boop, MD 12/15/2023 10:40:12 AM This report has been signed electronically.

## 2023-12-15 NOTE — Progress Notes (Signed)
 History and Physical Interval Note:  12/15/2023 10:16 AM  [name] [name]  has presented today for endoscopic procedure(s), with the diagnosis of  Encounter Diagnosis  Name Primary?   Gastroesophageal reflux disease with esophagitis, unspecified whether hemorrhage Yes  .  The various methods of evaluation and treatment have been discussed with the patient and/or family. After consideration of risks, benefits and other options for treatment, the patient has consented to  the endoscopic procedure(s).   The patient's history has been reviewed, patient examined, no change in status, stable for endoscopic procedure(s).  I have reviewed the patient's chart and labs.  Questions were answered to the patient's satisfaction.     Iva Boop, MD, [facility]

## 2023-12-16 ENCOUNTER — Ambulatory Visit (HOSPITAL_COMMUNITY)
Admission: RE | Admit: 2023-12-16 | Discharge: 2023-12-16 | Disposition: A | Discharge status: Home / Self Care | Source: Ambulatory Visit | Attending: Physician Assistant | Admitting: Physician Assistant

## 2023-12-16 ENCOUNTER — Telehealth: Payer: Self-pay | Admitting: *Deleted

## 2023-12-16 DIAGNOSIS — R1011 Right upper quadrant pain: Secondary | ICD-10-CM | POA: Diagnosis not present

## 2023-12-16 DIAGNOSIS — R1013 Epigastric pain: Secondary | ICD-10-CM | POA: Diagnosis not present

## 2023-12-16 NOTE — Telephone Encounter (Signed)
  Follow up Call-     12/15/2023    9:27 AM  Call back number  Post procedure Call Back phone  # [phone]  Permission to leave phone message Yes     Patient questions:  Do you have a fever, pain , or abdominal swelling? No. Pain Score  0 *  Have you tolerated food without any problems? Yes.    Have you been able to return to your normal activities? Yes.    Do you have any questions about your discharge instructions: Diet   No. Medications  No. Follow up visit  No.  Do you have questions or concerns about your Care? No.  Actions: * If pain score is 4 or above: No action needed, pain <4.

## 2023-12-18 ENCOUNTER — Ambulatory Visit (INDEPENDENT_AMBULATORY_CARE_PROVIDER_SITE_OTHER): Admitting: Family Medicine

## 2023-12-18 ENCOUNTER — Encounter: Payer: Self-pay | Admitting: Family Medicine

## 2023-12-18 VITALS — BP 116/82 | HR 59 | Temp 98.3°F | Ht 64.0 in | Wt 185.4 lb

## 2023-12-18 DIAGNOSIS — R09A2 Foreign body sensation, throat: Secondary | ICD-10-CM | POA: Diagnosis not present

## 2023-12-18 DIAGNOSIS — K449 Diaphragmatic hernia without obstruction or gangrene: Secondary | ICD-10-CM

## 2023-12-18 DIAGNOSIS — F419 Anxiety disorder, unspecified: Secondary | ICD-10-CM

## 2023-12-18 DIAGNOSIS — K219 Gastro-esophageal reflux disease without esophagitis: Secondary | ICD-10-CM

## 2023-12-18 NOTE — Progress Notes (Signed)
 Subjective:    Patient ID: [name] [name], female    DOB: 28-Nov-1950, 73 y.o.   MRN: [id number]  HPI  Wt Readings from Last 3 Encounters:  12/18/23 185 lb 6 oz (84.1 kg)  12/15/23 186 lb (84.4 kg)  12/11/23 186 lb (84.4 kg)   31.82 kg/m  Vitals:   12/18/23 1120  BP: 116/82  Pulse: (!) 59  Temp: 98.3 F (36.8 C)  SpO2: 97%   Pt presents for follow up of GI visit and testing  Cough / throat clearing and sputum productoin   EGD was 3/10 with Dr Leone Payor  Normal  3 cm HH Gastroesophageal flap valve classified as Hill Grade @ (fold rpesent, opens with respiration)  No signs of GERD at all  No bx done   Statement from report: - I very much doubt GERD as a cause of cough,                            throat-clearing and sputum production. She                            deterioriated w/ Nexium, reportedly. Recommend                            return to PCP - ? needs ENT and/or pulmonary or                            trial of oral steroid inhaler prior to another                            referral.  Prior to that  If EGD and [place] negative, consider ENT referral versus baclofen trial versus barium Given information about LPR, try to prevent cough/clearing with sugar free drops  Worsened with nexium   Original symptoms worsened after TEE on 1/27  Nausea  (improved after stopping lexapro and benadryl)  Frequent burping Phlegm in throat  Occational epigastric discomfort and soreness  Exac by eating or drinking  Relieved by lying on right side and taking deep breathes  Mucous -clear to white No shortness of breath No wheezing   Symptoms are triggered  If she is hot  If fan in face  If she drinks something cold   When she went off lexapro the symptoms got worse rather than better  Started back 2 d ago  ? It makes her regurgitate  Much more anxious off lexapro   Takes pepcid 20 mg bid   Had [place] on 3/11  Pending reading   Sister and mother had similar symptoms of  mucous production all the time  Celine Ahr has it also   No wheeze On shortness of breath    Last 2 days symptoms have been better  Has eliminated a lot of foods (spicy foods bother her)  More mucous with ice cream    Tea no longer bothers her  Some coffee    Lab Results  Component Value Date   NA 140 11/25/2023   K 4.1 11/25/2023   CO2 29 11/25/2023   GLUCOSE 86 11/25/2023   BUN 16 11/25/2023   CREATININE 0.73 11/25/2023   CALCIUM 9.4 11/25/2023   GFR 82.60 11/25/2023   EGFR 80 10/23/2023   GFRNONAA >60  09/06/2017   Lab Results  Component Value Date   ALT 17 11/25/2023   AST 23 11/25/2023   ALKPHOS 73 11/25/2023   BILITOT 0.7 11/25/2023   Lab Results  Component Value Date   LIPASE 26.0 11/25/2023   Lab Results  Component Value Date   WBC 5.7 11/25/2023   HGB 15.0 11/25/2023   HCT 44.2 11/25/2023   MCV 96.3 11/25/2023   PLT 210.0 11/25/2023      Patient Active Problem List   Diagnosis Date Noted   Globus sensation 12/18/2023   Right leg pain 03/14/2023   Epigastric pain 12/14/2021   Adverse effect of proton pump inhibitor 12/14/2021   Anxious mood 09/24/2017   Sleep disorder 09/12/2017   Palpitations 05/22/2015   OA (osteoarthritis) of knee 11/21/2014   Colon cancer screening 11/29/2013   Routine general medical examination at a health care facility 09/16/2011   DERMATITIS, ATOPIC 11/27/2010   URTICARIA DUE TO COLD OR HEAT 11/27/2010   EXTERNAL HEMORRHOIDS WITHOUT MENTION COMP 06/25/2010   ABNORMAL EKG 12/26/2009   OTHER CHRONIC SINUSITIS 10/19/2009   Vitamin D deficiency 04/21/2009   ALLERGIC RHINITIS 11/09/2008   GERD 07/28/2008   Osteopenia 03/14/2008   HYPERCHOLESTEROLEMIA, PURE 03/13/2007   HIATAL HERNIA 01/12/2007   Past Medical History:  Diagnosis Date   Acute meniscal tear of knee    Arthritis    "neck; left knee before replacement" (05/10/2015)   Basal cell cancer 02/2007   Breast cyst    "left"   Chest pain 06/2010   [facility]) and  palpitations - ruled out for MI with nl. Echo   Complication of anesthesia HARD TO WAKE   Difficulty sleeping    Eczema    GERD (gastroesophageal reflux disease)    Heart palpitations OCCASIONAL--  TAKES ATENOLOL PRN   Hiatal hernia    HSV-2 infection    buttocks   Hyperlipidemia DIET CONTROL   Melanoma of skin (HCC)    MVP (mitral valve prolapse)    per pt no treatment needed   Normal nuclear stress test 07/09/2010   Osteopenia 12/2013   T score -1.6 FRAX 16%/0.8%   PONV (postoperative nausea and vomiting)    Sleep disorder 09/12/2017   Past Surgical History:  Procedure Laterality Date   Carotid US  9/05   Mild, recheck 1 year   Carotid US  10/06   Normal   CHONDROPLASTY  10/23/2011   Procedure: CHONDROPLASTY;  Surgeon: Loanne Drilling, MD;  Location: [facility];  Service: Orthopedics;  Laterality: Left;  Left medial    COLONOSCOPY     ESOPHAGOGASTRODUODENOSCOPY  9/07   Normal   HYSTEROSCOPY WITH RESECTOSCOPE  2000   POLPECTOMY'S   JOINT REPLACEMENT     KNEE ARTHROSCOPY  10/23/2011   Procedure: ARTHROSCOPY KNEE;  Surgeon: Loanne Drilling, MD;  Location: [facility];  Service: Orthopedics;  Laterality: Left;  LEFT KNEE SCOPE WITH DEBRIDEMENT    MELANOMA EXCISION Right 1995   "side"   MOHS SURGERY Left    "side of my nose"   MOLE REMOVAL     "several; all over my body"   MRI neck  3/00   C4-C5 herniation small stenosis mild C4-5, C5-6   TONSILLECTOMY     TOTAL KNEE ARTHROPLASTY Left 11/21/2014   Procedure: LEFT TOTAL KNEE ARTHROPLASTY;  Surgeon: Loanne Drilling, MD;  Location: [facility];  Service: Orthopedics;  Laterality: Left;   TRANSESOPHAGEAL ECHOCARDIOGRAM (CATH LAB) N/A 10/28/2023   Procedure:  TRANSESOPHAGEAL ECHOCARDIOGRAM;  Surgeon: Jake Bathe, MD;  Location: [facility] INVASIVE CV LAB;  Service: Cardiovascular;  Laterality: N/A;   TRANSTHORACIC ECHOCARDIOGRAM  06-29-2010   NORMAL LVSF, EF 55-60%,  MILD MR   TUBAL LIGATION  1989   VAGINAL  HYSTERECTOMY  2001   complex hyperplasia with mother's history of uterine cancer   Social History   Tobacco Use   Smoking status: Former    Current packs/day: 0.00    Average packs/day: 1 pack/day for 15.0 years (15.0 ttl pk-yrs)    Types: Cigarettes    Start date: 10/08/1963    Quit date: 10/07/1978    Years since quitting: 45.2    Passive exposure: Never   Smokeless tobacco: Never  Vaping Use   Vaping status: Never Used  Substance Use Topics   Alcohol use: No    Alcohol/week: 0.0 standard drinks of alcohol   Drug use: No   Family History  Problem Relation Age of Onset   Cancer Mother        uterine   Obesity Mother    Thrombocytopenia Mother    Diabetes Mother    Osteoarthritis Mother    Alcohol abuse Father    Heart disease Father        CAD in 45's   Emphysema Father    Osteoarthritis Sister    Osteoarthritis Maternal Aunt    Heart disease Paternal Aunt        CAD   Heart disease Paternal Uncle        CAD   Diabetes Maternal Grandmother        type 2   Colon cancer Neg Hx    Breast cancer Neg Hx    Stomach cancer Neg Hx    Rectal cancer Neg Hx    Esophageal cancer Neg Hx    Allergies  Allergen Reactions   Amoxicillin Rash   Codeine Rash   Macrobid [Nitrofurantoin] Nausea Only and Other (See Comments)    GI issues; burning in esophagus   Clarithromycin Other (See Comments)    reaction not known   Trazodone And Nefazodone Palpitations   Current Outpatient Medications on File Prior to Visit  Medication Sig Dispense Refill   B Complex-C (SUPER B COMPLEX PO) Take 1 capsule by mouth daily.     Bioflavonoid Products (ESTER C PO) Take 500 mg by mouth daily.     Calcium Citrate-Vitamin D (CALCIUM + D PO) Take 1 tablet by mouth daily.     Cholecalciferol (VITAMIN D) 50 MCG (2000 UT) tablet Take 2,000 Units by mouth daily.     CINNAMON PO Take 1 capsule by mouth daily.     diclofenac sodium (VOLTAREN) 1 % GEL APPLY 4 GRAMS TO THE AFFECTED AREA 3 TIMES DAILY AS  NEEDED 100 g 3   ELDERBERRY PO Take 1 tablet by mouth daily.     escitalopram (LEXAPRO) 10 MG tablet Take 5 mg by mouth daily.     famotidine (PEPCID) 20 MG tablet TAKE 1 TABLET BY MOUTH TWICE A DAY (Patient taking differently: Take 20 mg by mouth daily.) 180 tablet 2   Garlic 300 MG CAPS Take 300 mg by mouth daily.     Magnesium 200 MG TABS Take 200 mg by mouth 2 (two) times a week.     Multiple Vitamin (MULTIVITAMIN) capsule Take 2 capsules by mouth daily.     Omega-3 Fatty Acids (OMEGA-3 FISH OIL) 300 MG CAPS Take 300 mg by mouth every other day.  Polyethylene Glycol 400 (BLINK TEARS OP) Place 1 drop into both eyes daily.     vitamin B-12 (CYANOCOBALAMIN) 500 MCG tablet Take 500 mcg by mouth daily.     No current facility-administered medications on file prior to visit.    Review of Systems  Constitutional:  Negative for activity change, appetite change, fatigue, fever and unexpected weight change.  HENT:  Positive for postnasal drip. Negative for congestion, ear pain, rhinorrhea, sinus pressure, sore throat, trouble swallowing and voice change.        Globus sensation  Spits clear to white mucous   Eyes:  Negative for pain, redness and visual disturbance.  Respiratory:  Positive for cough. Negative for apnea, choking, chest tightness, shortness of breath, wheezing and stridor.   Cardiovascular:  Negative for chest pain and palpitations.  Gastrointestinal:  Negative for abdominal pain, blood in stool, constipation, diarrhea, nausea, rectal pain and vomiting.       No abd pain today  This occurs occational   Endocrine: Negative for polydipsia and polyuria.  Genitourinary:  Negative for dysuria, frequency and urgency.  Musculoskeletal:  Negative for arthralgias, back pain and myalgias.  Skin:  Negative for pallor and rash.  Allergic/Immunologic: Negative for environmental allergies.  Neurological:  Negative for dizziness, syncope and headaches.  Hematological:  Negative for  adenopathy. Does not bruise/bleed easily.  Psychiatric/Behavioral:  Negative for decreased concentration and dysphoric mood. The patient is nervous/anxious.        Objective:   Physical Exam Constitutional:      General: She is not in acute distress.    Appearance: Normal appearance. She is well-developed and normal weight. She is not ill-appearing or diaphoretic.  HENT:     Head: Normocephalic and atraumatic.     Right Ear: Tympanic membrane and ear canal normal.     Left Ear: Tympanic membrane and ear canal normal.     Nose: Nose normal.     Mouth/Throat:     Mouth: Mucous membranes are moist.     Pharynx: Oropharynx is clear. No oropharyngeal exudate or posterior oropharyngeal erythema.  Eyes:     General: No scleral icterus.    Conjunctiva/sclera: Conjunctivae normal.     Pupils: Pupils are equal, round, and reactive to light.  Cardiovascular:     Rate and Rhythm: Regular rhythm. Bradycardia present.     Heart sounds: Normal heart sounds.  Pulmonary:     Effort: Pulmonary effort is normal. No respiratory distress.     Breath sounds: Normal breath sounds. No wheezing or rales.  Abdominal:     General: Bowel sounds are normal. There is no distension.     Palpations: Abdomen is soft. There is no mass.     Tenderness: There is no abdominal tenderness. There is no right CVA tenderness, left CVA tenderness, guarding or rebound.     Hernia: No hernia is present.  Musculoskeletal:     Cervical back: Normal range of motion and neck supple.  Lymphadenopathy:     Cervical: No cervical adenopathy.  Skin:    General: Skin is warm and dry.     Coloration: Skin is not pale.     Findings: No erythema.  Neurological:     Mental Status: She is alert.     Cranial Nerves: No cranial nerve deficit.     Motor: No weakness.  Psychiatric:        Mood and Affect: Mood is anxious.     Comments: Candidly discusses symptoms and stressors  Assessment & Plan:   Problem List  Items Addressed This Visit       Respiratory   HIATAL HERNIA   Reviewed EGD  3 cm HH  Doubt causing symptoms        Digestive   GERD   No GERD seen on recent egd  Reviewed notes from GI and report in detail  Small HH noted  Continues pepcid (may have some antihistamine effect)   Still dealing with globus sensation but is improved         Other   Globus sensation - Primary   Ongoing  Per pt runs in family  No wheeze or shortness of breath  Recent EGD and GI records reviewed -no evid of GERD on test  [place] abd is pending rad review  On pepcid /stopped ppi Getting back on ssri as pros outweighed cons   Considering ENT re visit (? Dr Jearld Fenton in past)  Will wait on [place] report first and see if her symptoms improve  Also encouraged her to keep record of triggers   ? If possible vasomotor rhinitis        Anxious mood   Back on lexapro  Feels better with it even though some possible GI side effects       Relevant Medications   escitalopram (LEXAPRO) 10 MG tablet

## 2023-12-18 NOTE — Assessment & Plan Note (Signed)
 No GERD seen on recent egd  Reviewed notes from GI and report in detail  Small HH noted  Continues pepcid (may have some antihistamine effect)   Still dealing with globus sensation but is improved

## 2023-12-18 NOTE — Assessment & Plan Note (Signed)
 Back on lexapro  Feels better with it even though some possible GI side effects

## 2023-12-18 NOTE — Assessment & Plan Note (Signed)
 Reviewed EGD  3 cm HH  Doubt causing symptoms

## 2023-12-18 NOTE — Patient Instructions (Signed)
 Continue lexapro if the pros outweigh the cons   Continue pepcid if it helps   Take allergy medicines if needed  Avoid triggers   Let's see how abdominal ultrasound looks   If symptoms (mucous in throat) do not continue to improve in coming weeks we can refer to ENT (saw Jearld Fenton in past) , or pulmonary or both

## 2023-12-18 NOTE — Assessment & Plan Note (Signed)
 Ongoing  Per pt runs in family  No wheeze or shortness of breath  Recent EGD and GI records reviewed -no evid of GERD on test  [place] abd is pending rad review  On pepcid /stopped ppi Getting back on ssri as pros outweighed cons   Considering ENT re visit (? Dr Jearld Fenton in past)  Will wait on [place] report first and see if her symptoms improve  Also encouraged her to keep record of triggers   ? If possible vasomotor rhinitis

## 2023-12-19 ENCOUNTER — Ambulatory Visit: Admitting: Family Medicine

## 2023-12-25 ENCOUNTER — Telehealth: Payer: Self-pay | Admitting: Physician Assistant

## 2023-12-25 NOTE — Telephone Encounter (Signed)
 Patient called and stated that she doe snot understand why her results are taking so long to be review and seen by the provider. I did advise patient that the result have not been release to the provider as yet. Patient is requesting a call back.Please advise.

## 2023-12-26 NOTE — Telephone Encounter (Signed)
 Called radiology reading room and spoke with Ramon Dredge. They are escalating patient's report to be read.

## 2023-12-26 NOTE — Telephone Encounter (Signed)
 Doree Albee, PA-C 12/26/2023  9:36 AM EDT     I'm sorry this took so long to for the radiologist to read, they have been behind. The the abdominal ultrasound shows unremarkable gallbladder normal liver normal pancreas spleen and kidneys.  No acute process to explain her symptoms. I also saw that you had the normal endoscopy with Dr. Leone Payor. I think at this time with some the symptoms you are having we may need a referral to ear nose and throat let me know if you would like to see primary care and get that referral or if you would like [place] to refer you.  We had also set up a follow-up appointment in our office if you are to discuss things further.

## 2023-12-26 NOTE — Telephone Encounter (Signed)
 Called and spoke with patient regarding results. Patient states that she has already followed up with PCP and symptoms have improved. Pt had no concerns at the end of the call.

## 2023-12-28 ENCOUNTER — Other Ambulatory Visit: Payer: Self-pay | Admitting: Family Medicine

## 2024-01-08 DIAGNOSIS — M1711 Unilateral primary osteoarthritis, right knee: Secondary | ICD-10-CM | POA: Diagnosis not present

## 2024-01-13 ENCOUNTER — Telehealth: Payer: Self-pay | Admitting: *Deleted

## 2024-01-13 NOTE — Telephone Encounter (Signed)
 Pt is schedule for her physical / labs

## 2024-01-13 NOTE — Telephone Encounter (Signed)
 Please schedule labs prior to CPE, PCP will put orders in closer to appt

## 2024-01-13 NOTE — Telephone Encounter (Signed)
 Copied from CRM [id number]. Topic: Clinical - Request for Lab/Test Order >> Jan 13, 2024 12:35 PM Fuller Mandril wrote: Reason for CRM: Patient called made appt for physical and labs. Would like labs week before physical as they have done previously. No order in for labs. Thank You

## 2024-01-15 DIAGNOSIS — M1711 Unilateral primary osteoarthritis, right knee: Secondary | ICD-10-CM | POA: Diagnosis not present

## 2024-01-22 DIAGNOSIS — M1711 Unilateral primary osteoarthritis, right knee: Secondary | ICD-10-CM | POA: Diagnosis not present

## 2024-02-15 ENCOUNTER — Other Ambulatory Visit: Payer: Self-pay | Admitting: Family Medicine

## 2024-03-14 ENCOUNTER — Telehealth: Payer: Self-pay | Admitting: Family Medicine

## 2024-03-14 DIAGNOSIS — E7800 Pure hypercholesterolemia, unspecified: Secondary | ICD-10-CM

## 2024-03-14 DIAGNOSIS — K219 Gastro-esophageal reflux disease without esophagitis: Secondary | ICD-10-CM

## 2024-03-14 DIAGNOSIS — E559 Vitamin D deficiency, unspecified: Secondary | ICD-10-CM

## 2024-03-14 DIAGNOSIS — T471X5A Adverse effect of other antacids and anti-gastric-secretion drugs, initial encounter: Secondary | ICD-10-CM

## 2024-03-14 NOTE — Telephone Encounter (Signed)
-----   Message from Gerry Krone sent at 03/02/2024  2:54 PM EDT ----- Regarding: Lab orders for Tues,6.10.25 Patient is scheduled for CPX labs, please order future labs, Thanks , Anselmo Kings

## 2024-03-15 ENCOUNTER — Ambulatory Visit (INDEPENDENT_AMBULATORY_CARE_PROVIDER_SITE_OTHER): Payer: PPO

## 2024-03-15 VITALS — BP 116/82 | Ht 64.0 in | Wt 182.0 lb

## 2024-03-15 DIAGNOSIS — Z0000 Encounter for general adult medical examination without abnormal findings: Secondary | ICD-10-CM

## 2024-03-15 NOTE — Patient Instructions (Addendum)
 Ms. Whitehouse , Thank you for taking time out of your busy schedule to complete your Annual Wellness Visit with me. I enjoyed our conversation and look forward to speaking with you again next year. I, as well as your care team,  appreciate your ongoing commitment to your health goals. Please review the following plan we discussed and let me know if I can assist you in the future. Your Game plan/ To Do List    Referrals: If you haven't heard from the office you've been referred to, please reach out to them at the phone provided.  none Follow up Visits: Next Medicare AWV with our clinical staff: patient declined at this moment  Have you seen your provider in the last 6 months (3 months if uncontrolled diabetes)? No Next Office Visit with your provider: 03/16/2024  Clinician Recommendations:  Aim for 30 minutes of exercise or brisk walking, 6-8 glasses of water, and 5 servings of fruits and vegetables each day.       This is a list of the screening recommended for you and due dates:  Health Maintenance  Topic Date Due   Zoster (Shingles) Vaccine (1 of 2) 03/28/2002   COVID-19 Vaccine (1 - 2024-25 season) 12/10/2024*   Flu Shot  05/07/2024   Medicare Annual Wellness Visit  03/15/2025   Mammogram  04/07/2025   Colon Cancer Screening  05/28/2025   DTaP/Tdap/Td vaccine (3 - Td or Tdap) 12/19/2026   Pneumonia Vaccine  Completed   DEXA scan (bone density measurement)  Completed   Hepatitis C Screening  Completed   HPV Vaccine  Aged Out   Meningitis B Vaccine  Aged Out  *Topic was postponed. The date shown is not the original due date.    Advanced directives: (Copy Requested) Please bring a copy of your health care power of attorney and living will to the office to be added to your chart at your convenience. You can mail to [facility] [address] [place], [place] [address] or email to [email or web address] Advance Care Planning is important because it:  [x]  Makes sure  you receive the medical care that is consistent with your values, goals, and preferences  [x]  It provides guidance to your family and loved ones and reduces their decisional burden about whether or not they are making the right decisions based on your wishes.  Follow the link provided in your after visit summary or read over the paperwork we have mailed to you to help you started getting your Advance Directives in place. If you need assistance in completing these, please reach out to us  so that we can help you!  See attachments for Preventive Care and Fall Prevention Tips.

## 2024-03-15 NOTE — Progress Notes (Signed)
 Because this visit was a virtual/telehealth visit,  certain criteria was not obtained, such a blood pressure, CBG if applicable, and timed get up and go. Any medications not marked as "taking" were not mentioned during the medication reconciliation part of the visit. Any vitals not documented were not able to be obtained due to this being a telehealth visit or patient was unable to self-report a recent blood pressure reading due to a lack of equipment at home via telehealth. Vitals that have been documented are verbally provided by the patient.   This visit was performed by a medical professional under my direct supervision. I was immediately available for consultation/collaboration. I have reviewed and agree with the Annual Wellness Visit documentation.  Subjective:   [name] [name] is a 73 y.o. who presents for a Medicare Wellness preventive visit.  As a reminder, Annual Wellness Visits don't include a physical exam, and some assessments may be limited, especially if this visit is performed virtually. We may recommend an in-person follow-up visit with your provider if needed.  Visit Complete: Virtual I connected with  [name] [name] on 03/15/24 by a audio enabled telemedicine application and verified that I am speaking with the correct person using two identifiers.  Patient Location: Home  Provider Location: Home Office  I discussed the limitations of evaluation and management by telemedicine. The patient expressed understanding and agreed to proceed.  Vital Signs: Because this visit was a virtual/telehealth visit, some criteria may be missing or patient reported. Any vitals not documented were not able to be obtained and vitals that have been documented are patient reported.  VideoDeclined- This patient declined Librarian, academic. Therefore the visit was completed with audio only.  Persons Participating in Visit: Patient.  AWV Questionnaire: No: Patient  Medicare AWV questionnaire was not completed prior to this visit.  Cardiac Risk Factors include: advanced age (>84men, >62 women);sedentary lifestyle     Objective:     Today's Vitals   03/15/24 1438  BP: 116/82  Weight: 182 lb (82.6 kg)  Height: 5\' 4"  (1.626 m)   Body mass index is 31.24 kg/m.     03/15/2024    2:37 PM 10/28/2023    7:56 AM 03/12/2023    2:45 PM 03/11/2022   11:05 AM 01/24/2021   12:04 PM 01/19/2020   12:06 PM 09/06/2017   12:41 PM  Advanced Directives  Does Patient Have a Medical Advance Directive? Yes No No No No No No  Type of Advance Directive Healthcare Power of Attorney        Does patient want to make changes to medical advance directive? No - Guardian declined        Copy of Healthcare Power of Attorney in Chart? No - copy requested        Would patient like information on creating a medical advance directive?   No - Patient declined  No - Patient declined No - Patient declined No - Patient declined    Current Medications (verified) Outpatient Encounter Medications as of 03/15/2024  Medication Sig   B Complex-C (SUPER B COMPLEX PO) Take 1 capsule by mouth daily.   Bioflavonoid Products (ESTER C PO) Take 500 mg by mouth daily.   Calcium Citrate-Vitamin D  (CALCIUM + D PO) Take 1 tablet by mouth daily.   Cholecalciferol (VITAMIN D ) 50 MCG (2000 UT) tablet Take 2,000 Units by mouth daily.   CINNAMON PO Take 1 capsule by mouth daily.   diclofenac  sodium (VOLTAREN ) 1 %  GEL APPLY 4 GRAMS TO THE AFFECTED AREA 3 TIMES DAILY AS NEEDED   ELDERBERRY PO Take 1 tablet by mouth daily.   escitalopram  (LEXAPRO ) 10 MG tablet Take 5 mg by mouth daily.   famotidine  (PEPCID ) 20 MG tablet TAKE 1 TABLET BY MOUTH TWICE A DAY   Garlic 300 MG CAPS Take 300 mg by mouth daily.   Magnesium  200 MG TABS Take 200 mg by mouth 2 (two) times a week.   Multiple Vitamin (MULTIVITAMIN) capsule Take 2 capsules by mouth daily.   Omega-3 Fatty Acids (OMEGA-3 FISH OIL) 300 MG CAPS Take 300 mg by  mouth every other day.   Polyethylene Glycol 400 (BLINK TEARS OP) Place 1 drop into both eyes daily.   vitamin B-12 (CYANOCOBALAMIN ) 500 MCG tablet Take 500 mcg by mouth daily.   No facility-administered encounter medications on file as of 03/15/2024.    Allergies (verified) Amoxicillin, Codeine, Macrobid  [nitrofurantoin ], Clarithromycin, and Trazodone  and nefazodone   History: Past Medical History:  Diagnosis Date   Acute meniscal tear of knee    Arthritis    "neck; left knee before replacement" (05/10/2015)   Basal cell cancer 02/2007   Breast cyst    "left"   Chest pain 06/2010   [facility]) and palpitations - ruled out for MI with nl. Echo   Complication of anesthesia HARD TO WAKE   Difficulty sleeping    Eczema    GERD (gastroesophageal reflux disease)    Heart palpitations OCCASIONAL--  TAKES ATENOLOL  PRN   Hiatal hernia    HSV-2 infection    buttocks   Hyperlipidemia DIET CONTROL   Melanoma of skin (HCC)    MVP (mitral valve prolapse)    per pt no treatment needed   Normal nuclear stress test 07/09/2010   Osteopenia 12/2013   T score -1.6 FRAX 16%/0.8%   PONV (postoperative nausea and vomiting)    Sleep disorder 09/12/2017   Past Surgical History:  Procedure Laterality Date   Carotid US   9/05   Mild, recheck 1 year   Carotid US   10/06   Normal   CHONDROPLASTY  10/23/2011   Procedure: CHONDROPLASTY;  Surgeon: Aurther Blue, MD;  Location: [facility];  Service: Orthopedics;  Laterality: Left;  Left medial    COLONOSCOPY     ESOPHAGOGASTRODUODENOSCOPY  9/07   Normal   HYSTEROSCOPY WITH RESECTOSCOPE  2000   POLPECTOMY'S   JOINT REPLACEMENT     KNEE ARTHROSCOPY  10/23/2011   Procedure: ARTHROSCOPY KNEE;  Surgeon: Aurther Blue, MD;  Location: [facility];  Service: Orthopedics;  Laterality: Left;  LEFT KNEE SCOPE WITH DEBRIDEMENT    MELANOMA EXCISION Right 1995   "side"   MOHS SURGERY Left    "side of my nose"   MOLE REMOVAL      "several; all over my body"   MRI neck  3/00   C4-C5 herniation small stenosis mild C4-5, C5-6   TONSILLECTOMY     TOTAL KNEE ARTHROPLASTY Left 11/21/2014   Procedure: LEFT TOTAL KNEE ARTHROPLASTY;  Surgeon: Aurther Blue, MD;  Location: [facility];  Service: Orthopedics;  Laterality: Left;   TRANSESOPHAGEAL ECHOCARDIOGRAM (CATH LAB) N/A 10/28/2023   Procedure: TRANSESOPHAGEAL ECHOCARDIOGRAM;  Surgeon: Hugh Madura, MD;  Location: [facility];  Service: Cardiovascular;  Laterality: N/A;   TRANSTHORACIC ECHOCARDIOGRAM  06-29-2010   NORMAL LVSF, EF 55-60%,  MILD MR   TUBAL LIGATION  1989   VAGINAL HYSTERECTOMY  2001  complex hyperplasia with mother's history of uterine cancer   Family History  Problem Relation Age of Onset   Cancer Mother        uterine   Obesity Mother    Thrombocytopenia Mother    Diabetes Mother    Osteoarthritis Mother    Alcohol abuse Father    Heart disease Father        CAD in 67's   Emphysema Father    Osteoarthritis Sister    Osteoarthritis Maternal Aunt    Heart disease Paternal Aunt        CAD   Heart disease Paternal Uncle        CAD   Diabetes Maternal Grandmother        type 2   Colon cancer Neg Hx    Breast cancer Neg Hx    Stomach cancer Neg Hx    Rectal cancer Neg Hx    Esophageal cancer Neg Hx    Social History   Socioeconomic History   Marital status: Married    Spouse name: Not on file   Number of children: Not on file   Years of education: Not on file   Highest education level: Not on file  Occupational History   Not on file  Tobacco Use   Smoking status: Former    Current packs/day: 0.00    Average packs/day: 1 pack/day for 15.0 years (15.0 ttl pk-yrs)    Types: Cigarettes    Start date: 10/08/1963    Quit date: 10/07/1978    Years since quitting: 45.4    Passive exposure: Never   Smokeless tobacco: Never  Vaping Use   Vaping status: Never Used  Substance and Sexual Activity   Alcohol use: No    Alcohol/week: 0.0  standard drinks of alcohol   Drug use: No   Sexual activity: Yes    Birth control/protection: Post-menopausal, Surgical    Comment: HYST-1st intercourse 73 yo-Fewer than 5 partners, hx of HSV2, no abnormal pap, no DES  Other Topics Concern   Not on file  Social History Narrative   Daily caffeine use.   Patient gets regular exercise.   Social Drivers of Corporate investment banker Strain: Low Risk  (03/15/2024)   Overall Financial Resource Strain (CARDIA)    Difficulty of Paying Living Expenses: Not hard at all  Food Insecurity: No Food Insecurity (03/15/2024)   Hunger Vital Sign    Worried About Running Out of Food in the Last Year: Never true    Ran Out of Food in the Last Year: Never true  Transportation Needs: No Transportation Needs (03/15/2024)   PRAPARE - Administrator, Civil Service (Medical): No    Lack of Transportation (Non-Medical): No  Physical Activity: Insufficiently Active (03/15/2024)   Exercise Vital Sign    Days of Exercise per Week: 2 days    Minutes of Exercise per Session: 30 min  Stress: No Stress Concern Present (03/15/2024)   [employer] of Occupational Health - Occupational Stress Questionnaire    Feeling of Stress : Not at all  Social Connections: Moderately Integrated (03/15/2024)   Social Connection and Isolation Panel [NHANES]    Frequency of Communication with Friends and Family: More than three times a week    Frequency of Social Gatherings with Friends and Family: More than three times a week    Attends Religious Services: More than 4 times per year    Active Member of Clubs or Organizations: No  Attends Banker Meetings: Never    Marital Status: Married    Tobacco Counseling Counseling given: Not Answered    Clinical Intake:  Pre-visit preparation completed: Yes        BMI - recorded: 31.24 Nutritional Status: BMI > 30  Obese Nutritional Risks: None Diabetes: No  No results found for: "HGBA1C"   How  often do you need to have someone help you when you read instructions, pamphlets, or other written materials from your doctor or pharmacy?: 1 - Never What is the last grade level you completed in school?: 9th grade  Interpreter Needed?: No  Information entered by :: Todd Argabright,CMA   Activities of Daily Living     03/15/2024    2:42 PM 10/28/2023    7:56 AM  In your present state of health, do you have any difficulty performing the following activities:  Hearing? 0 0  Vision? 0 0  Difficulty concentrating or making decisions? 0 0  Walking or climbing stairs? 0   Dressing or bathing? 0   Doing errands, shopping? 0   Preparing Food and eating ? N   Using the Toilet? N   In the past six months, have you accidently leaked urine? N   Do you have problems with loss of bowel control? N   Managing your Medications? N   Managing your Finances? N   Housekeeping or managing your Housekeeping? N     Patient Care Team: Tower, Manley Seeds, MD as PCP - General  I have updated your Care Teams any recent Medical Services you may have received from other providers in the past year.     Assessment:    This is a routine wellness examination for [name] [name].  Hearing/Vision screen Hearing Screening - Comments:: Patient has no issues with hearing  Vision Screening - Comments:: Patient wears glasses    Goals Addressed             This Visit's Progress    Patient Stated   On track    Exercise more       Depression Screen     03/15/2024    2:44 PM 12/18/2023   11:28 AM 11/25/2023   12:04 PM 03/14/2023    1:57 PM 03/12/2023    2:44 PM 11/18/2022   12:03 PM 03/11/2022   11:06 AM  PHQ 2/9 Scores  PHQ - 2 Score 0 0 0 0 0 0 0  PHQ- 9 Score 1 0 0 0  6     Fall Risk     03/15/2024    2:41 PM 12/18/2023   11:28 AM 11/25/2023   12:04 PM 03/14/2023    1:57 PM 03/12/2023    2:46 PM  Fall Risk   Falls in the past year? 1 1 1  0 0  Number falls in past yr: 0 0 0 0 0  Injury with Fall? 0 0 -- 0 0  Comment   pt  didn't answer question    Risk for fall due to : History of fall(s) History of fall(s) History of fall(s) No Fall Risks No Fall Risks  Follow up Falls evaluation completed;Falls prevention discussed Falls evaluation completed Falls evaluation completed Falls evaluation completed Falls prevention discussed;Falls evaluation completed    MEDICARE RISK AT HOME:  Medicare Risk at Home Any stairs in or around the home?: Yes If so, are there any without handrails?: No Home free of loose throw rugs in walkways, pet beds, electrical cords, etc?: Yes Adequate lighting in  your home to reduce risk of falls?: Yes Life alert?: No Use of a cane, walker or w/c?: No Grab bars in the bathroom?: Yes Shower chair or bench in shower?: Yes Elevated toilet seat or a handicapped toilet?: No  TIMED UP AND GO:  Was the test performed?  no  Cognitive Function: 6CIT completed    01/24/2021   12:06 PM 01/19/2020   12:09 PM  MMSE - Mini Mental State Exam  Not completed: Refused   Orientation to time  5  Orientation to Place  5  Registration  3  Attention/ Calculation  5  Recall  3  Language- repeat  1        03/12/2023    2:47 PM 03/11/2022   11:08 AM  6CIT Screen  What Year? 0 points 0 points  What month? 0 points 0 points  What time? 0 points 0 points  Count back from 20 0 points 0 points  Months in reverse 0 points 0 points  Repeat phrase 0 points 0 points  Total Score 0 points 0 points    Immunizations Immunization History  Administered Date(s) Administered   Influenza Split 10/12/2012   Influenza,inj,Quad PF,6+ Mos 07/22/2014, 10/06/2015   Pneumococcal Conjugate-13 12/19/2017   Pneumococcal Polysaccharide-23 12/24/2018   Td 01/27/2006   Tdap 12/18/2016   Zoster, Live 11/29/2013    Screening Tests Health Maintenance  Topic Date Due   Zoster Vaccines- Shingrix (1 of 2) 03/28/2002   COVID-19 Vaccine (1 - 2024-25 season) 12/10/2024 (Originally 06/08/2023)   INFLUENZA VACCINE  05/07/2024    Medicare Annual Wellness (AWV)  03/15/2025   MAMMOGRAM  04/07/2025   Colonoscopy  05/28/2025   DTaP/Tdap/Td (3 - Td or Tdap) 12/19/2026   Pneumonia Vaccine 48+ Years old  Completed   DEXA SCAN  Completed   Hepatitis C Screening  Completed   HPV VACCINES  Aged Out   Meningococcal B Vaccine  Aged Out    Health Maintenance  Health Maintenance Due  Topic Date Due   Zoster Vaccines- Shingrix (1 of 2) 03/28/2002   Health Maintenance Items Addressed:patient declined shingles   Additional Screening:  Vision Screening: Recommended annual ophthalmology exams for early detection of glaucoma and other disorders of the eye. Would you like a referral to an eye doctor? No    Dental Screening: Recommended annual dental exams for proper oral hygiene  Community Resource Referral / Chronic Care Management: CRR required this visit?  No   CCM required this visit?  No   Plan:    I have personally reviewed and noted the following in the patient's chart:   Medical and social history Use of alcohol, tobacco or illicit drugs  Current medications and supplements including opioid prescriptions. Patient is not currently taking opioid prescriptions. Functional ability and status Nutritional status Physical activity Advanced directives List of other physicians Hospitalizations, surgeries, and ER visits in previous 12 months Vitals Screenings to include cognitive, depression, and falls Referrals and appointments  In addition, I have reviewed and discussed with patient certain preventive protocols, quality metrics, and best practice recommendations. A written personalized care plan for preventive services as well as general preventive health recommendations were provided to patient.   [name] [name], [place]   03/15/2024   After Visit Summary: (MyChart) Due to this being a telephonic visit, the after visit summary with patients personalized plan was offered to patient via MyChart   Notes:  Nothing significant to report at this time.

## 2024-03-16 ENCOUNTER — Other Ambulatory Visit (INDEPENDENT_AMBULATORY_CARE_PROVIDER_SITE_OTHER)

## 2024-03-16 DIAGNOSIS — K219 Gastro-esophageal reflux disease without esophagitis: Secondary | ICD-10-CM | POA: Diagnosis not present

## 2024-03-16 DIAGNOSIS — E559 Vitamin D deficiency, unspecified: Secondary | ICD-10-CM | POA: Diagnosis not present

## 2024-03-16 DIAGNOSIS — E7800 Pure hypercholesterolemia, unspecified: Secondary | ICD-10-CM | POA: Diagnosis not present

## 2024-03-16 LAB — LIPID PANEL
Cholesterol: 198 mg/dL (ref 0–200)
HDL: 53 mg/dL (ref >39.00)
LDL Cholesterol: 125 mg/dL — ABNORMAL HIGH (ref 0–99)
NonHDL: 144.81
Total CHOL/HDL Ratio: 4
Triglycerides: 101 mg/dL (ref 0.0–149.0)
VLDL: 20.2 mg/dL (ref 0.0–40.0)

## 2024-03-16 LAB — CBC WITH DIFFERENTIAL/PLATELET
Basophils Absolute: 0 10*3/uL (ref 0.0–0.1)
Basophils Relative: 0.5 % (ref 0.0–3.0)
Eosinophils Absolute: 0.1 10*3/uL (ref 0.0–0.7)
Eosinophils Relative: 1.8 % (ref 0.0–5.0)
HCT: 43.9 % (ref 36.0–46.0)
Hemoglobin: 15.1 g/dL — ABNORMAL HIGH (ref 12.0–15.0)
Lymphocytes Relative: 35.7 % (ref 12.0–46.0)
Lymphs Abs: 1.5 10*3/uL (ref 0.7–4.0)
MCHC: 34.3 g/dL (ref 30.0–36.0)
MCV: 94.9 fl (ref 78.0–100.0)
Monocytes Absolute: 0.3 10*3/uL (ref 0.1–1.0)
Monocytes Relative: 7.4 % (ref 3.0–12.0)
Neutro Abs: 2.3 10*3/uL (ref 1.4–7.7)
Neutrophils Relative %: 54.6 % (ref 43.0–77.0)
Platelets: 192 10*3/uL (ref 150.0–400.0)
RBC: 4.63 Mil/uL (ref 3.87–5.11)
RDW: 13.7 % (ref 11.5–15.5)
WBC: 4.2 10*3/uL (ref 4.0–10.5)

## 2024-03-16 LAB — COMPREHENSIVE METABOLIC PANEL WITH GFR
ALT: 17 U/L (ref 0–35)
AST: 21 U/L (ref 0–37)
Albumin: 4.2 g/dL (ref 3.5–5.2)
Alkaline Phosphatase: 66 U/L (ref 39–117)
BUN: 17 mg/dL (ref 6–23)
CO2: 29 meq/L (ref 19–32)
Calcium: 9.8 mg/dL (ref 8.4–10.5)
Chloride: 103 meq/L (ref 96–112)
Creatinine, Ser: 0.79 mg/dL (ref 0.40–1.20)
GFR: 74.97 mL/min (ref >60.00)
Glucose, Bld: 83 mg/dL (ref 70–99)
Potassium: 4.1 meq/L (ref 3.5–5.1)
Sodium: 138 meq/L (ref 135–145)
Total Bilirubin: 0.8 mg/dL (ref 0.2–1.2)
Total Protein: 6.5 g/dL (ref 6.0–8.3)

## 2024-03-18 LAB — TSH: TSH: 1.87 u[IU]/mL (ref 0.35–5.50)

## 2024-03-18 LAB — VITAMIN D 25 HYDROXY (VIT D DEFICIENCY, FRACTURES): VITD: 45.85 ng/mL (ref 30.00–100.00)

## 2024-03-19 ENCOUNTER — Ambulatory Visit: Payer: Self-pay | Admitting: Family Medicine

## 2024-03-23 ENCOUNTER — Ambulatory Visit (INDEPENDENT_AMBULATORY_CARE_PROVIDER_SITE_OTHER): Admitting: Family Medicine

## 2024-03-23 ENCOUNTER — Encounter: Payer: Self-pay | Admitting: Family Medicine

## 2024-03-23 VITALS — BP 112/70 | HR 74 | Temp 98.0°F | Ht 63.75 in | Wt 184.0 lb

## 2024-03-23 DIAGNOSIS — K219 Gastro-esophageal reflux disease without esophagitis: Secondary | ICD-10-CM | POA: Diagnosis not present

## 2024-03-23 DIAGNOSIS — M8589 Other specified disorders of bone density and structure, multiple sites: Secondary | ICD-10-CM

## 2024-03-23 DIAGNOSIS — G479 Sleep disorder, unspecified: Secondary | ICD-10-CM

## 2024-03-23 DIAGNOSIS — Z1211 Encounter for screening for malignant neoplasm of colon: Secondary | ICD-10-CM | POA: Diagnosis not present

## 2024-03-23 DIAGNOSIS — E7800 Pure hypercholesterolemia, unspecified: Secondary | ICD-10-CM | POA: Diagnosis not present

## 2024-03-23 DIAGNOSIS — E559 Vitamin D deficiency, unspecified: Secondary | ICD-10-CM

## 2024-03-23 DIAGNOSIS — Z0000 Encounter for general adult medical examination without abnormal findings: Secondary | ICD-10-CM

## 2024-03-23 NOTE — Assessment & Plan Note (Signed)
 Off ppi Taking famotidine   Has HH

## 2024-03-23 NOTE — Assessment & Plan Note (Addendum)
 This is slightly worse  Disc goals for lipids and reasons to control them Rev last labs with pt Rev low sat fat diet in detail  LDL up to 125  (but HDL up a bit which is favorable)  Will work on diet (less beef and bacon) Re check 3 mo

## 2024-03-23 NOTE — Assessment & Plan Note (Signed)
 Sleeping better with lexapro  5 mg daily   Plans to continue this with good sleep hygiene

## 2024-03-23 NOTE — Progress Notes (Signed)
 Subjective:    Patient ID: [name] [name], female    DOB: Jun 03, 1951, 73 y.o.   MRN: [id number]  HPI  Here for health maintenance exam and to review chronic medical problems   Wt Readings from Last 3 Encounters:  03/23/24 184 lb (83.5 kg)  03/15/24 182 lb (82.6 kg)  12/18/23 185 lb 6 oz (84.1 kg)   31.83 kg/m  Vitals:   03/23/24 1121  BP: 112/70  Pulse: 74  Temp: 98 F (36.7 C)  SpO2: 96%    Immunization History  Administered Date(s) Administered   Influenza Split 10/12/2012   Influenza,inj,Quad PF,6+ Mos 07/22/2014, 10/06/2015   Pneumococcal Conjugate-13 12/19/2017   Pneumococcal Polysaccharide-23 12/24/2018   Td 01/27/2006   Tdap 12/18/2016   Zoster, Live 11/29/2013    There are no preventive care reminders to display for this patient.   Mammogram 04/2023 at the breast center (will call to schedule/does not need order)  Self breast exam- no lumps / goes to gyn   Gyn health Sees Dr Colvin Dec   Colon cancer screening -colonoscopy 05/2015 with 10 y recall   Bone health  Dexa  10/2023 osteopenia  Sees Dr Colvin Dec gyn for this  [facility] / caught her right shoe - knee hurt and did not pick up foot (no injury)  Fractures-none  Supplements - vit D and ca  Last vitamin D  Lab Results  Component Value Date   VD25OH 45.85 03/16/2024    Exercise :  Had a period of time when she did not due to knee pain  Now is back on exercise bike 4-5 times per week up to 40 minutes  Bike has arms on it for upper body   Dermatology care   Mood    03/15/2024    2:44 PM 12/18/2023   11:28 AM 11/25/2023   12:04 PM 03/14/2023    1:57 PM 03/12/2023    2:44 PM  Depression screen PHQ 2/9  Decreased Interest 0 0 0 0 0  Down, Depressed, Hopeless 0 0 0 0 0  PHQ - 2 Score 0 0 0 0 0  Altered sleeping 1 0 0 0   Tired, decreased energy 0 0 0 0   Change in appetite 0 0 0 0   Feeling bad or failure about yourself  0 0 0 0   Trouble concentrating 0 0 0 0   Moving slowly or fidgety/restless  0 0 0 0   Suicidal thoughts 0 0 0 0   PHQ-9 Score 1 0 0 0   Difficult doing work/chores Not difficult at all Not difficult at all Not difficult at all Not difficult at all    Takes lexapro  5 mg daily for mood and sleep  Really helps her sleep    Hyperlipidemia Lab Results  Component Value Date   CHOL 198 03/16/2024   CHOL 186 03/14/2023   CHOL 189 03/12/2022   Lab Results  Component Value Date   HDL 53.00 03/16/2024   HDL 49.20 03/14/2023   HDL 58.40 03/12/2022   Lab Results  Component Value Date   LDLCALC 125 (H) 03/16/2024   LDLCALC 107 (H) 03/14/2023   LDLCALC 115 (H) 03/12/2022   Lab Results  Component Value Date   TRIG 101.0 03/16/2024   TRIG 152.0 (H) 03/14/2023   TRIG 79.0 03/12/2022   Lab Results  Component Value Date   CHOLHDL 4 03/16/2024   CHOLHDL 4 03/14/2023   CHOLHDL 3 03/12/2022   Lab Results  Component Value  Date   LDLDIRECT 124.4 09/12/2010   LDL is up from 107 to 125  Not eating any different  Took krill oil for 2 mo and stopped   Does eat some beef  Some bacon    Pepcid  for acid/ globus sensation  Has HH    Lab Results  Component Value Date   NA 138 03/16/2024   K 4.1 03/16/2024   CO2 29 03/16/2024   GLUCOSE 83 03/16/2024   BUN 17 03/16/2024   CREATININE 0.79 03/16/2024   CALCIUM 9.8 03/16/2024   GFR 74.97 03/16/2024   EGFR 80 10/23/2023   GFRNONAA >60 09/06/2017   Lab Results  Component Value Date   ALT 17 03/16/2024   AST 21 03/16/2024   ALKPHOS 66 03/16/2024   BILITOT 0.8 03/16/2024   Lab Results  Component Value Date   WBC 4.2 03/16/2024   HGB 15.1 (H) 03/16/2024   HCT 43.9 03/16/2024   MCV 94.9 03/16/2024   PLT 192.0 03/16/2024   Lab Results  Component Value Date   TSH 1.87 03/16/2024      Patient Active Problem List   Diagnosis Date Noted   Globus sensation 12/18/2023   Right leg pain 03/14/2023   Anxious mood 09/24/2017   Sleep disorder 09/12/2017   Palpitations 05/22/2015   OA (osteoarthritis)  of knee 11/21/2014   Colon cancer screening 11/29/2013   Routine general medical examination at a health care facility 09/16/2011   DERMATITIS, ATOPIC 11/27/2010   URTICARIA DUE TO COLD OR HEAT 11/27/2010   EXTERNAL HEMORRHOIDS WITHOUT MENTION COMP 06/25/2010   ABNORMAL EKG 12/26/2009   OTHER CHRONIC SINUSITIS 10/19/2009   Vitamin D  deficiency 04/21/2009   ALLERGIC RHINITIS 11/09/2008   GERD 07/28/2008   Osteopenia 03/14/2008   HYPERCHOLESTEROLEMIA, PURE 03/13/2007   HIATAL HERNIA 01/12/2007   Past Medical History:  Diagnosis Date   Acute meniscal tear of knee    Arthritis    neck; left knee before replacement (05/10/2015)   Basal cell cancer 02/2007   Breast cyst    left   Chest pain 06/2010   [facility]) and palpitations - ruled out for MI with nl. Echo   Complication of anesthesia HARD TO WAKE   Difficulty sleeping    Eczema    GERD (gastroesophageal reflux disease)    Heart palpitations OCCASIONAL--  TAKES ATENOLOL  PRN   Hiatal hernia    HSV-2 infection    buttocks   Hyperlipidemia DIET CONTROL   Melanoma of skin (HCC)    MVP (mitral valve prolapse)    per pt no treatment needed   Normal nuclear stress test 07/09/2010   Osteopenia 12/2013   T score -1.6 FRAX 16%/0.8%   PONV (postoperative nausea and vomiting)    Sleep disorder 09/12/2017   Past Surgical History:  Procedure Laterality Date   Carotid US   9/05   Mild, recheck 1 year   Carotid US   10/06   Normal   CHONDROPLASTY  10/23/2011   Procedure: CHONDROPLASTY;  Surgeon: Aurther Blue, MD;  Location: [facility];  Service: Orthopedics;  Laterality: Left;  Left medial    COLONOSCOPY     ESOPHAGOGASTRODUODENOSCOPY  9/07   Normal   HYSTEROSCOPY WITH RESECTOSCOPE  2000   POLPECTOMY'S   JOINT REPLACEMENT     KNEE ARTHROSCOPY  10/23/2011   Procedure: ARTHROSCOPY KNEE;  Surgeon: Aurther Blue, MD;  Location: [facility];  Service: Orthopedics;  Laterality: Left;  LEFT KNEE SCOPE  WITH DEBRIDEMENT  MELANOMA EXCISION Right 1995   side   MOHS SURGERY Left    side of my nose   MOLE REMOVAL     several; all over my body   MRI neck  3/00   C4-C5 herniation small stenosis mild C4-5, C5-6   TONSILLECTOMY     TOTAL KNEE ARTHROPLASTY Left 11/21/2014   Procedure: LEFT TOTAL KNEE ARTHROPLASTY;  Surgeon: Aurther Blue, MD;  Location: [facility];  Service: Orthopedics;  Laterality: Left;   TRANSESOPHAGEAL ECHOCARDIOGRAM (CATH LAB) N/A 10/28/2023   Procedure: TRANSESOPHAGEAL ECHOCARDIOGRAM;  Surgeon: Hugh Madura, MD;  Location: [facility];  Service: Cardiovascular;  Laterality: N/A;   TRANSTHORACIC ECHOCARDIOGRAM  06-29-2010   NORMAL LVSF, EF 55-60%,  MILD MR   TUBAL LIGATION  1989   VAGINAL HYSTERECTOMY  2001   complex hyperplasia with mother's history of uterine cancer   Social History   Tobacco Use   Smoking status: Former    Current packs/day: 0.00    Average packs/day: 1 pack/day for 15.0 years (15.0 ttl pk-yrs)    Types: Cigarettes    Start date: 10/08/1963    Quit date: 10/07/1978    Years since quitting: 45.4    Passive exposure: Never   Smokeless tobacco: Never  Vaping Use   Vaping status: Never Used  Substance Use Topics   Alcohol use: No    Alcohol/week: 0.0 standard drinks of alcohol   Drug use: No   Family History  Problem Relation Age of Onset   Cancer Mother        uterine   Obesity Mother    Thrombocytopenia Mother    Diabetes Mother    Osteoarthritis Mother    Alcohol abuse Father    Heart disease Father        CAD in 56's   Emphysema Father    Osteoarthritis Sister    Osteoarthritis Maternal Aunt    Heart disease Paternal Aunt        CAD   Heart disease Paternal Uncle        CAD   Diabetes Maternal Grandmother        type 2   Colon cancer Neg Hx    Breast cancer Neg Hx    Stomach cancer Neg Hx    Rectal cancer Neg Hx    Esophageal cancer Neg Hx    Allergies  Allergen Reactions   Amoxicillin Rash   Codeine Rash    Macrobid  [Nitrofurantoin ] Nausea Only and Other (See Comments)    GI issues; burning in esophagus   Clarithromycin Other (See Comments)    reaction not known   Trazodone  And Nefazodone Palpitations   Current Outpatient Medications on File Prior to Visit  Medication Sig Dispense Refill   B Complex-C (SUPER B COMPLEX PO) Take 1 capsule by mouth daily.     Bioflavonoid Products (ESTER C PO) Take 500 mg by mouth daily.     Calcium Citrate-Vitamin D  (CALCIUM + D PO) Take 1 tablet by mouth daily.     Cholecalciferol (VITAMIN D ) 50 MCG (2000 UT) tablet Take 2,000 Units by mouth daily.     CINNAMON PO Take 1 capsule by mouth daily.     diclofenac  sodium (VOLTAREN ) 1 % GEL APPLY 4 GRAMS TO THE AFFECTED AREA 3 TIMES DAILY AS NEEDED 100 g 3   ELDERBERRY PO Take 1 tablet by mouth daily.     escitalopram  (LEXAPRO ) 10 MG tablet Take 5 mg by mouth daily.  famotidine  (PEPCID ) 20 MG tablet TAKE 1 TABLET BY MOUTH TWICE A DAY 180 tablet 2   Garlic 300 MG CAPS Take 300 mg by mouth daily.     Magnesium  200 MG TABS Take 200 mg by mouth 2 (two) times a week.     Multiple Vitamin (MULTIVITAMIN) capsule Take 2 capsules by mouth daily.     Omega-3 Fatty Acids (OMEGA-3 FISH OIL) 300 MG CAPS Take 300 mg by mouth every other day.     Polyethylene Glycol 400 (BLINK TEARS OP) Place 1 drop into both eyes daily.     vitamin B-12 (CYANOCOBALAMIN ) 500 MCG tablet Take 500 mcg by mouth daily.     No current facility-administered medications on file prior to visit.    Review of Systems  Constitutional:  Negative for activity change, appetite change, fatigue, fever and unexpected weight change.  HENT:  Negative for congestion, ear pain, rhinorrhea, sinus pressure and sore throat.   Eyes:  Negative for pain, redness and visual disturbance.  Respiratory:  Negative for cough, shortness of breath and wheezing.   Cardiovascular:  Negative for chest pain and palpitations.  Gastrointestinal:  Negative for abdominal pain,  blood in stool, constipation and diarrhea.  Endocrine: Negative for polydipsia and polyuria.  Genitourinary:  Negative for dysuria, frequency and urgency.  Musculoskeletal:  Negative for arthralgias, back pain and myalgias.  Skin:  Negative for pallor and rash.  Allergic/Immunologic: Negative for environmental allergies.  Neurological:  Negative for dizziness, syncope and headaches.  Hematological:  Negative for adenopathy. Does not bruise/bleed easily.  Psychiatric/Behavioral:  Negative for decreased concentration and dysphoric mood. The patient is not nervous/anxious.        Objective:   Physical Exam Constitutional:      General: She is not in acute distress.    Appearance: Normal appearance. She is well-developed. She is not ill-appearing or diaphoretic.  HENT:     Head: Normocephalic and atraumatic.     Right Ear: Tympanic membrane, ear canal and external ear normal.     Left Ear: Tympanic membrane, ear canal and external ear normal.     Nose: Nose normal. No congestion.     Mouth/Throat:     Mouth: Mucous membranes are moist.     Pharynx: Oropharynx is clear. No posterior oropharyngeal erythema.   Eyes:     General: No scleral icterus.    Extraocular Movements: Extraocular movements intact.     Conjunctiva/sclera: Conjunctivae normal.     Pupils: Pupils are equal, round, and reactive to light.   Neck:     Thyroid : No thyromegaly.     Vascular: No carotid bruit or JVD.   Cardiovascular:     Rate and Rhythm: Normal rate and regular rhythm.     Pulses: Normal pulses.     Heart sounds: Normal heart sounds.     No gallop.  Pulmonary:     Effort: Pulmonary effort is normal. No respiratory distress.     Breath sounds: Normal breath sounds. No wheezing.     Comments: Good air exch Chest:     Chest wall: No tenderness.  Abdominal:     General: Bowel sounds are normal. There is no distension or abdominal bruit.     Palpations: Abdomen is soft. There is no mass.      Tenderness: There is no abdominal tenderness.     Hernia: No hernia is present.  Genitourinary:    Comments: Breast exam: No mass, nodules, thickening, tenderness, bulging, retraction, inflamation, nipple  discharge or skin changes noted.  No axillary or clavicular LA.      Musculoskeletal:        General: No tenderness. Normal range of motion.     Cervical back: Normal range of motion and neck supple. No rigidity. No muscular tenderness.     Right lower leg: No edema.     Left lower leg: No edema.     Comments: No kyphosis   Lymphadenopathy:     Cervical: No cervical adenopathy.   Skin:    General: Skin is warm and dry.     Coloration: Skin is not pale.     Findings: No erythema or rash.     Comments: Solar lentigines diffusely    Neurological:     Mental Status: She is alert. Mental status is at baseline.     Cranial Nerves: No cranial nerve deficit.     Motor: No abnormal muscle tone.     Coordination: Coordination normal.     Gait: Gait normal.     Deep Tendon Reflexes: Reflexes are normal and symmetric. Reflexes normal.   Psychiatric:        Mood and Affect: Mood normal.        Cognition and Memory: Cognition and memory normal.           Assessment & Plan:   Problem List Items Addressed This Visit       Digestive   GERD   Off ppi Taking famotidine   Has HH         Musculoskeletal and Integument   Osteopenia   Dexa 10/2023 Watched by gyn  One fall, no fracture  On ca and D  Discussed fall prevention, supplements and exercise for bone density          Other   Vitamin D  deficiency   Vitamin D  level is therapeutic with current supplementation Disc importance of this to bone and overall health Last vitamin D  Lab Results  Component Value Date   VD25OH 45.85 03/16/2024   Takes 2000 international units daily      Sleep disorder   Sleeping better with lexapro  5 mg daily   Plans to continue this with good sleep hygiene       Routine general  medical examination at a health care facility - Primary   Reviewed health habits including diet and exercise and skin cancer prevention Reviewed appropriate screening tests for age  Also reviewed health mt list, fam hx and immunization status , as well as social and family history   See HPI Labs reviewed and ordered Health Maintenance  Topic Date Due   COVID-19 Vaccine (1 - 2024-25 season) 12/10/2024*   Zoster (Shingles) Vaccine (1 of 2) 06/23/2025*   Flu Shot  05/07/2024   Medicare Annual Wellness Visit  03/15/2025   Mammogram  04/07/2025   Colon Cancer Screening  05/28/2025   DTaP/Tdap/Td vaccine (3 - Td or Tdap) 12/19/2026   Pneumococcal Vaccine for age over 64  Completed   DEXA scan (bone density measurement)  Completed   Hepatitis C Screening  Completed   HPV Vaccine  Aged Out   Meningitis B Vaccine  Aged Out  *Topic was postponed. The date shown is not the original due date.   Encouraged to schedule her mammogram next mo  Utd gyn care  Discussed fall prevention, supplements and exercise for bone density  PHQ 1        HYPERCHOLESTEROLEMIA, PURE   This is slightly worse  Disc  goals for lipids and reasons to control them Rev last labs with pt Rev low sat fat diet in detail  LDL up to 125  (but HDL up a bit which is favorable)  Will work on diet (less beef and bacon) Re check 3 mo      Relevant Orders   Lipid panel   Colon cancer screening   Colonoscopy 05/2015 10 y recall

## 2024-03-23 NOTE — Assessment & Plan Note (Signed)
 Vitamin D  level is therapeutic with current supplementation Disc importance of this to bone and overall health Last vitamin D  Lab Results  Component Value Date   VD25OH 45.85 03/16/2024   Takes 2000 international units daily

## 2024-03-23 NOTE — Patient Instructions (Addendum)
 Don't forget to schedule your mammogram    Keep pedaling  Add some strength training to your routine, this is important for bone and brain health and can reduce your risk of falls and help your body use insulin properly and regulate weight  Light weights, exercise bands , and internet videos are a good way to start  Yoga (chair or regular), machines , floor exercises or a gym with machines are also good options   For cholesterol  Avoid red meat/ fried foods/ egg yolks/ fatty breakfast meats/ butter, cheese and high fat dairy/ and shellfish    Please go ahead and cut the beef and bacon  Let's re check this in about 3 months

## 2024-03-23 NOTE — Assessment & Plan Note (Signed)
 Colonoscopy 05/2015 10 y recall

## 2024-03-23 NOTE — Assessment & Plan Note (Signed)
 Dexa 10/2023 Watched by gyn  One fall, no fracture  On ca and D  Discussed fall prevention, supplements and exercise for bone density

## 2024-03-23 NOTE — Assessment & Plan Note (Signed)
 Reviewed health habits including diet and exercise and skin cancer prevention Reviewed appropriate screening tests for age  Also reviewed health mt list, fam hx and immunization status , as well as social and family history   See HPI Labs reviewed and ordered Health Maintenance  Topic Date Due   COVID-19 Vaccine (1 - 2024-25 season) 12/10/2024*   Zoster (Shingles) Vaccine (1 of 2) 06/23/2025*   Flu Shot  05/07/2024   Medicare Annual Wellness Visit  03/15/2025   Mammogram  04/07/2025   Colon Cancer Screening  05/28/2025   DTaP/Tdap/Td vaccine (3 - Td or Tdap) 12/19/2026   Pneumococcal Vaccine for age over 53  Completed   DEXA scan (bone density measurement)  Completed   Hepatitis C Screening  Completed   HPV Vaccine  Aged Out   Meningitis B Vaccine  Aged Out  *Topic was postponed. The date shown is not the original due date.   Encouraged to schedule her mammogram next mo  Utd gyn care  Discussed fall prevention, supplements and exercise for bone density  PHQ 1

## 2024-04-01 ENCOUNTER — Other Ambulatory Visit: Payer: Self-pay | Admitting: Family Medicine

## 2024-04-01 DIAGNOSIS — Z1231 Encounter for screening mammogram for malignant neoplasm of breast: Secondary | ICD-10-CM

## 2024-04-12 ENCOUNTER — Ambulatory Visit
Admission: RE | Admit: 2024-04-12 | Discharge: 2024-04-12 | Disposition: A | Discharge status: Home / Self Care | Source: Ambulatory Visit | Attending: Family Medicine | Admitting: Family Medicine

## 2024-04-12 DIAGNOSIS — Z1231 Encounter for screening mammogram for malignant neoplasm of breast: Secondary | ICD-10-CM | POA: Diagnosis not present

## 2024-04-15 ENCOUNTER — Ambulatory Visit: Payer: Self-pay | Admitting: Family Medicine

## 2024-04-15 DIAGNOSIS — D1801 Hemangioma of skin and subcutaneous tissue: Secondary | ICD-10-CM | POA: Diagnosis not present

## 2024-04-15 DIAGNOSIS — L821 Other seborrheic keratosis: Secondary | ICD-10-CM | POA: Diagnosis not present

## 2024-04-15 DIAGNOSIS — D224 Melanocytic nevi of scalp and neck: Secondary | ICD-10-CM | POA: Diagnosis not present

## 2024-04-15 DIAGNOSIS — L918 Other hypertrophic disorders of the skin: Secondary | ICD-10-CM | POA: Diagnosis not present

## 2024-04-15 DIAGNOSIS — D485 Neoplasm of uncertain behavior of skin: Secondary | ICD-10-CM | POA: Diagnosis not present

## 2024-04-15 DIAGNOSIS — L814 Other melanin hyperpigmentation: Secondary | ICD-10-CM | POA: Diagnosis not present

## 2024-04-15 DIAGNOSIS — L570 Actinic keratosis: Secondary | ICD-10-CM | POA: Diagnosis not present

## 2024-04-15 DIAGNOSIS — D225 Melanocytic nevi of trunk: Secondary | ICD-10-CM | POA: Diagnosis not present

## 2024-04-15 DIAGNOSIS — D2271 Melanocytic nevi of right lower limb, including hip: Secondary | ICD-10-CM | POA: Diagnosis not present

## 2024-04-15 DIAGNOSIS — Z85828 Personal history of other malignant neoplasm of skin: Secondary | ICD-10-CM | POA: Diagnosis not present

## 2024-04-15 DIAGNOSIS — L858 Other specified epidermal thickening: Secondary | ICD-10-CM | POA: Diagnosis not present

## 2024-06-14 ENCOUNTER — Other Ambulatory Visit: Payer: Self-pay | Admitting: Family Medicine

## 2024-06-15 ENCOUNTER — Telehealth: Payer: Self-pay | Admitting: Family Medicine

## 2024-06-15 DIAGNOSIS — B009 Herpesviral infection, unspecified: Secondary | ICD-10-CM | POA: Insufficient documentation

## 2024-06-15 MED ORDER — ACYCLOVIR 200 MG PO CAPS: 200.0000 mg | ORAL_CAPSULE | Freq: Every day | ORAL | 1 refills | Status: AC | PRN

## 2024-06-15 NOTE — Telephone Encounter (Signed)
 Copied from CRM [id number]. Topic: Clinical - Medication Refill >> Jun 15, 2024 11:26 AM Henretta I wrote: Medication: acyclovir  200mg    Has the patient contacted their pharmacy? Yes, pharmacy stated to contact us   (Agent: If no, request that the patient contact the pharmacy for the refill. If patient does not wish to contact the pharmacy document the reason why and proceed with request.) (Agent: If yes, when and what did the pharmacy advise?)  This is the patient's preferred pharmacy:  [facility] [phone] [place] - [address] [place] [address] Phone: [phone] Fax: [phone]  Is this the correct pharmacy for this prescription? Yes If no, delete pharmacy and type the correct one.   Has the prescription been filled recently? No  Is the patient out of the medication? No, but the one she has is really old   Has the patient been seen for an appointment in the last year OR does the patient have an upcoming appointment? Yes  Can we respond through MyChart? Yes  Agent: Please be advised that Rx refills may take up to 3 business days. We ask that you follow-up with your pharmacy.

## 2024-06-21 DIAGNOSIS — L918 Other hypertrophic disorders of the skin: Secondary | ICD-10-CM | POA: Diagnosis not present

## 2024-06-21 DIAGNOSIS — L820 Inflamed seborrheic keratosis: Secondary | ICD-10-CM | POA: Diagnosis not present

## 2024-06-21 DIAGNOSIS — Z85828 Personal history of other malignant neoplasm of skin: Secondary | ICD-10-CM | POA: Diagnosis not present

## 2024-06-21 DIAGNOSIS — D225 Melanocytic nevi of trunk: Secondary | ICD-10-CM | POA: Diagnosis not present

## 2024-06-23 ENCOUNTER — Ambulatory Visit: Payer: Self-pay | Admitting: Family Medicine

## 2024-06-23 ENCOUNTER — Other Ambulatory Visit (INDEPENDENT_AMBULATORY_CARE_PROVIDER_SITE_OTHER)

## 2024-06-23 DIAGNOSIS — E7800 Pure hypercholesterolemia, unspecified: Secondary | ICD-10-CM | POA: Diagnosis not present

## 2024-06-23 LAB — LIPID PANEL
Cholesterol: 162 mg/dL (ref 0–200)
HDL: 55.1 mg/dL (ref >39.00)
LDL Cholesterol: 90 mg/dL (ref 0–99)
NonHDL: 107.05
Total CHOL/HDL Ratio: 3
Triglycerides: 84 mg/dL (ref 0.0–149.0)
VLDL: 16.8 mg/dL (ref 0.0–40.0)

## 2024-06-24 NOTE — Telephone Encounter (Signed)
 Copied from CRM #[id number]. Topic: Clinical - Lab/Test Results >> Jun 23, 2024  4:21 PM DeAngela L wrote: Reason for CRM: patient calling cause she received a mychart message about her test results  Pt num [phone] (M)

## 2024-08-23 DIAGNOSIS — L570 Actinic keratosis: Secondary | ICD-10-CM | POA: Diagnosis not present

## 2024-08-23 DIAGNOSIS — Z85828 Personal history of other malignant neoplasm of skin: Secondary | ICD-10-CM | POA: Diagnosis not present

## 2024-08-23 DIAGNOSIS — D3611 Benign neoplasm of peripheral nerves and autonomic nervous system of face, head, and neck: Secondary | ICD-10-CM | POA: Diagnosis not present

## 2024-08-23 DIAGNOSIS — L929 Granulomatous disorder of the skin and subcutaneous tissue, unspecified: Secondary | ICD-10-CM | POA: Diagnosis not present

## 2024-08-31 ENCOUNTER — Encounter: Payer: Self-pay | Admitting: Internal Medicine

## 2024-10-05 ENCOUNTER — Ambulatory Visit (HOSPITAL_COMMUNITY)
Admission: RE | Admit: 2024-10-05 | Discharge: 2024-10-05 | Disposition: A | Discharge status: Home / Self Care | Source: Ambulatory Visit | Attending: Cardiovascular Disease | Admitting: Cardiovascular Disease

## 2024-10-05 DIAGNOSIS — I340 Nonrheumatic mitral (valve) insufficiency: Secondary | ICD-10-CM | POA: Diagnosis not present

## 2024-10-06 LAB — ECHOCARDIOGRAM COMPLETE
Area-P 1/2: 3.39 cm2
MV M vel: 5.47 m/s
MV Peak grad: 119.5 mmHg
S' Lateral: 4.2 cm

## 2024-10-07 ENCOUNTER — Ambulatory Visit: Payer: Self-pay | Admitting: Internal Medicine

## 2024-10-08 ENCOUNTER — Telehealth: Payer: Self-pay | Admitting: Internal Medicine

## 2024-10-08 NOTE — Telephone Encounter (Signed)
 Spoke with patient. Advised echo is pending final review from primary cardiologist. Advised will message in MyChart with results or call.

## 2024-10-08 NOTE — Telephone Encounter (Signed)
 PT requesting c/b for echo results. Please advise

## 2024-10-13 NOTE — Telephone Encounter (Signed)
 Patient requesting c/b with echo results. Please advise.

## 2024-10-14 NOTE — Telephone Encounter (Signed)
 Patient is returning call

## 2024-10-29 ENCOUNTER — Telehealth: Payer: Self-pay | Admitting: *Deleted

## 2024-10-29 NOTE — Telephone Encounter (Signed)
 Please schedule fasting labs prior to CPE, PCP will place orders closer to appt date

## 2024-10-29 NOTE — Telephone Encounter (Signed)
 Copied from CRM #[id number]. Topic: Clinical - Request for Lab/Test Order >> Oct 29, 2024  2:15 PM Charolett L wrote: Reason for CRM: Patient requesting lab orders for physical on 06/16

## 2024-11-05 ENCOUNTER — Ambulatory Visit (INDEPENDENT_AMBULATORY_CARE_PROVIDER_SITE_OTHER): Admitting: Family Medicine

## 2024-11-05 ENCOUNTER — Encounter: Payer: Self-pay | Admitting: Family Medicine

## 2024-11-05 VITALS — BP 116/78 | HR 77 | Temp 97.6°F | Ht 63.75 in | Wt 174.2 lb

## 2024-11-05 DIAGNOSIS — R5382 Chronic fatigue, unspecified: Secondary | ICD-10-CM | POA: Diagnosis not present

## 2024-11-05 DIAGNOSIS — R8290 Unspecified abnormal findings in urine: Secondary | ICD-10-CM | POA: Diagnosis not present

## 2024-11-05 DIAGNOSIS — E669 Obesity, unspecified: Secondary | ICD-10-CM | POA: Insufficient documentation

## 2024-11-05 LAB — POCT URINE DIPSTICK
Bilirubin, UA: NEGATIVE
Blood, UA: NEGATIVE
Glucose, UA: NEGATIVE mg/dL
Ketones, POC UA: NEGATIVE mg/dL
Nitrite, UA: NEGATIVE
POC PROTEIN,UA: NEGATIVE
Spec Grav, UA: 1.01
Urobilinogen, UA: 0.2 U/dL
pH, UA: 6

## 2024-11-05 NOTE — Progress Notes (Signed)
 "  Subjective:    Patient ID: [name], female    DOB: 06/20/1951, 74 y.o.   MRN: [id number]  HPI  Wt Readings from Last 3 Encounters:  11/05/24 174 lb 4 oz (79 kg)  03/23/24 184 lb (83.5 kg)  03/15/24 182 lb (82.6 kg)   30.14 kg/m  Vitals:   11/05/24 1229  BP: 116/78  Pulse: 77  Temp: 97.6 F (36.4 C)  SpO2: 98%    Pt presents with  Urinary symptoms  Fatigue for over a week  Obesity   Feeling tired Little stuffy head  No cough  No ST No headache  No color to mucous    Over the counter Mucinex    No burning  Some frequency - (does drink a lot/ ? Usual)  Urgency - sometimes No blood in urine  No fever  Little nausea /no vomiting       Urinalysis   Trace leuk  Lab Results  Component Value Date   NA 138 03/16/2024   K 4.1 03/16/2024   CO2 29 03/16/2024   GLUCOSE 83 03/16/2024   BUN 17 03/16/2024   CREATININE 0.79 03/16/2024   CALCIUM 9.8 03/16/2024   GFR 74.97 03/16/2024   EGFR 80 10/23/2023   GFRNONAA >60 09/06/2017    Not feeling depressed  Sleep is fair -takes 12.5 benadryl  at night  On lexapro  5 mg in evening    Lab Results  Component Value Date   WBC 4.2 03/16/2024   HGB 15.1 (H) 03/16/2024   HCT 43.9 03/16/2024   MCV 94.9 03/16/2024   PLT 192.0 03/16/2024   Lab Results  Component Value Date   TSH 1.87 03/16/2024    Working on weight loss  Exercise bike with handles  Floor pedaler     Patient Active Problem List   Diagnosis Date Noted   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   Globus sensation 12/18/2023   Right leg pain 03/14/2023   Anxious mood 09/24/2017   Sleep disorder 09/12/2017   Palpitations 05/22/2015   OA (osteoarthritis) of knee 11/21/2014   Colon cancer screening 11/29/2013   Routine general medical examination at a health care facility 09/16/2011   DERMATITIS, ATOPIC 11/27/2010   URTICARIA DUE TO COLD OR HEAT 11/27/2010   EXTERNAL HEMORRHOIDS WITHOUT MENTION COMP 06/25/2010   ABNORMAL EKG 12/26/2009    OTHER CHRONIC SINUSITIS 10/19/2009   Vitamin D  deficiency 04/21/2009   ALLERGIC RHINITIS 11/09/2008   GERD 07/28/2008   Osteopenia 03/14/2008   HYPERCHOLESTEROLEMIA, PURE 03/13/2007   HIATAL HERNIA 01/12/2007   Past Medical History:  Diagnosis Date   Acute meniscal tear of knee    Arthritis    neck; left knee before replacement (05/10/2015)   Basal cell cancer 02/2007   Breast cyst    left   Chest pain 06/2010   [facility]) and palpitations - ruled out for MI with nl. Echo   Complication of anesthesia HARD TO WAKE   Difficulty sleeping    Eczema    GERD (gastroesophageal reflux disease)    Heart palpitations OCCASIONAL--  TAKES ATENOLOL  PRN   Hiatal hernia    HSV-2 infection    buttocks   Hyperlipidemia DIET CONTROL   Melanoma of skin (HCC)    MVP (mitral valve prolapse)    per pt no treatment needed   Normal nuclear stress test 07/09/2010   Osteopenia 12/2013   T score -1.6 FRAX 16%/0.8%   PONV (postoperative nausea and vomiting)    Sleep disorder 09/12/2017  Past Surgical History:  Procedure Laterality Date   Carotid US   9/05   Mild, recheck 1 year   Carotid US   10/06   Normal   CHONDROPLASTY  10/23/2011   Procedure: CHONDROPLASTY;  Surgeon: Dempsey LULLA Moan, MD;  Location: [facility];  Service: Orthopedics;  Laterality: Left;  Left medial    COLONOSCOPY     ESOPHAGOGASTRODUODENOSCOPY  9/07   Normal   HYSTEROSCOPY WITH RESECTOSCOPE  2000   POLPECTOMY'S   JOINT REPLACEMENT     KNEE ARTHROSCOPY  10/23/2011   Procedure: ARTHROSCOPY KNEE;  Surgeon: Dempsey LULLA Moan, MD;  Location: [facility];  Service: Orthopedics;  Laterality: Left;  LEFT KNEE SCOPE WITH DEBRIDEMENT    MELANOMA EXCISION Right 1995   side   MOHS SURGERY Left    side of my nose   MOLE REMOVAL     several; all over my body   MRI neck  3/00   C4-C5 herniation small stenosis mild C4-5, C5-6   TONSILLECTOMY     TOTAL KNEE ARTHROPLASTY Left 11/21/2014   Procedure:  LEFT TOTAL KNEE ARTHROPLASTY;  Surgeon: Dempsey Moan LULLA, MD;  Location: [facility];  Service: Orthopedics;  Laterality: Left;   TRANSESOPHAGEAL ECHOCARDIOGRAM (CATH LAB) N/A 10/28/2023   Procedure: TRANSESOPHAGEAL ECHOCARDIOGRAM;  Surgeon: Jeffrie Oneil BROCKS, MD;  Location: [facility];  Service: Cardiovascular;  Laterality: N/A;   TRANSTHORACIC ECHOCARDIOGRAM  06-29-2010   NORMAL LVSF, EF 55-60%,  MILD MR   TUBAL LIGATION  1989   VAGINAL HYSTERECTOMY  2001   complex hyperplasia with mother's history of uterine cancer   Social History[1] Family History  Problem Relation Age of Onset   Cancer Mother        uterine   Obesity Mother    Thrombocytopenia Mother    Diabetes Mother    Osteoarthritis Mother    Alcohol abuse Father    Heart disease Father        CAD in 66's   Emphysema Father    Osteoarthritis Sister    Osteoarthritis Maternal Aunt    Heart disease Paternal Aunt        CAD   Heart disease Paternal Uncle        CAD   Diabetes Maternal Grandmother        type 2   Colon cancer Neg Hx    Breast cancer Neg Hx    Stomach cancer Neg Hx    Rectal cancer Neg Hx    Esophageal cancer Neg Hx    Allergies[2] Medications Ordered Prior to Encounter[3]  Review of Systems  Constitutional:  Positive for fatigue. Negative for appetite change and fever.  HENT:  Positive for congestion, postnasal drip, rhinorrhea and sinus pressure. Negative for ear pain, sinus pain, sneezing, sore throat and voice change.   Eyes:  Negative for pain and discharge.  Respiratory:  Positive for cough. Negative for shortness of breath, wheezing and stridor.   Cardiovascular:  Negative for chest pain.  Gastrointestinal:  Negative for diarrhea, nausea and vomiting.  Genitourinary:  Positive for frequency. Negative for hematuria and urgency.       Urine odor  Musculoskeletal:  Negative for arthralgias and myalgias.  Skin:  Negative for rash.  Neurological:  Negative for dizziness, weakness,  light-headedness and headaches.  Psychiatric/Behavioral:  Negative for confusion and dysphoric mood.        Objective:   Physical Exam Constitutional:      General: She is not in acute  distress.    Appearance: Normal appearance. She is well-developed. She is obese. She is not ill-appearing, toxic-appearing or diaphoretic.  HENT:     Head: Normocephalic and atraumatic.     Comments: Nares are injected and congested      Right Ear: Tympanic membrane, ear canal and external ear normal.     Left Ear: Tympanic membrane, ear canal and external ear normal.     Nose: Congestion and rhinorrhea present.     Mouth/Throat:     Mouth: Mucous membranes are moist.     Pharynx: Oropharynx is clear. No oropharyngeal exudate or posterior oropharyngeal erythema.     Comments: Clear pnd  Eyes:     General:        Right eye: No discharge.        Left eye: No discharge.     Conjunctiva/sclera: Conjunctivae normal.     Pupils: Pupils are equal, round, and reactive to light.  Cardiovascular:     Rate and Rhythm: Normal rate.     Heart sounds: Normal heart sounds.  Pulmonary:     Effort: Pulmonary effort is normal. No respiratory distress.     Breath sounds: Normal breath sounds. No stridor. No wheezing, rhonchi or rales.  Chest:     Chest wall: No tenderness.  Abdominal:     General: There is no distension.     Palpations: There is no mass.     Tenderness: There is no abdominal tenderness.     Comments: No suprapubic tenderness or fullness  No cva tenderness   Musculoskeletal:     Cervical back: Normal range of motion and neck supple.  Lymphadenopathy:     Cervical: No cervical adenopathy.  Skin:    General: Skin is warm and dry.     Capillary Refill: Capillary refill takes less than 2 seconds.     Findings: No rash.  Neurological:     Mental Status: She is alert.     Cranial Nerves: No cranial nerve deficit.  Psychiatric:        Mood and Affect: Mood normal.           Assessment  & Plan:   Assessment & Plan Abnormal urine odor Urinalysis is fairly clear (there is trace leuk) Culture pending  Will watch for other urinary symptoms  Will treat if positive  Encouraged good fluid intake  Orders:   POCT URINE DIPSTICK   Urine Culture  Chronic fatigue ? If acute on chronic  Worse in past week  Also nasal congestion/mild cough Suspect mild uri  Reviewed past labs  Reassuring exam   Encouraged to update if not improved in next 1-2 wk or if worse  Treat symptoms       Obesity (BMI 30-39.9) Discussed how this problem influences overall health and the risks it imposes  Reviewed plan for weight loss with lower calorie diet (via better food choices (lower glycemic and portion control) along with exercise building up to or more than 30 minutes 5 days per week including some aerobic activity and strength training             [1]  Social History Tobacco Use   Smoking status: Former    Current packs/day: 0.00    Average packs/day: 1 pack/day for 15.0 years (15.0 ttl pk-yrs)    Types: Cigarettes    Start date: 10/08/1963    Quit date: 10/07/1978    Years since quitting: 46.1    Passive exposure: Never  Smokeless tobacco: Never  Vaping Use   Vaping status: Never Used  Substance Use Topics   Alcohol use: No    Alcohol/week: 0.0 standard drinks of alcohol   Drug use: No  [2]  Allergies Allergen Reactions   Amoxicillin Rash   Codeine Rash   Macrobid  [Nitrofurantoin ] Nausea Only and Other (See Comments)    GI issues; burning in esophagus   Clarithromycin Other (See Comments)    reaction not known   Trazodone  And Nefazodone Palpitations  [3]  Current Outpatient Medications on File Prior to Visit  Medication Sig Dispense Refill   acyclovir  (ZOVIRAX ) 200 MG capsule Take 1 capsule (200 mg total) by mouth daily as needed (outbreak). 90 capsule 1   B Complex-C (SUPER B COMPLEX PO) Take 1 capsule by mouth daily.     Bioflavonoid Products (ESTER C PO)  Take 500 mg by mouth daily.     Calcium Citrate-Vitamin D  (CALCIUM + D PO) Take 1 tablet by mouth daily.     Cholecalciferol (VITAMIN D ) 50 MCG (2000 UT) tablet Take 2,000 Units by mouth daily.     CINNAMON PO Take 1 capsule by mouth daily.     diclofenac  sodium (VOLTAREN ) 1 % GEL APPLY 4 GRAMS TO THE AFFECTED AREA 3 TIMES DAILY AS NEEDED 100 g 3   ELDERBERRY PO Take 1 tablet by mouth daily.     escitalopram  (LEXAPRO ) 10 MG tablet TAKE 1/2 TABLET BY MOUTH DAILY 45 tablet 2   famotidine  (PEPCID ) 20 MG tablet TAKE 1 TABLET BY MOUTH TWICE A DAY 180 tablet 2   Garlic 300 MG CAPS Take 300 mg by mouth daily.     Magnesium  200 MG TABS Take 200 mg by mouth 2 (two) times a week.     Multiple Vitamin (MULTIVITAMIN) capsule Take 2 capsules by mouth daily.     Omega-3 Fatty Acids (OMEGA-3 FISH OIL) 300 MG CAPS Take 300 mg by mouth every other day.     Polyethylene Glycol 400 (BLINK TEARS OP) Place 1 drop into both eyes daily.     vitamin B-12 (CYANOCOBALAMIN ) 500 MCG tablet Take 500 mcg by mouth daily.     No current facility-administered medications on file prior to visit.   "

## 2024-11-05 NOTE — Assessment & Plan Note (Signed)
 Discussed how this problem influences overall health and the risks it imposes  Reviewed plan for weight loss with lower calorie diet (via better food choices (lower glycemic and portion control) along with exercise building up to or more than 30 minutes 5 days per week including some aerobic activity and strength training

## 2024-11-05 NOTE — Assessment & Plan Note (Signed)
?   If acute on chronic  Worse in past week  Also nasal congestion/mild cough Suspect mild uri  Reviewed past labs  Reassuring exam   Encouraged to update if not improved in next 1-2 wk or if worse  Treat symptoms

## 2024-11-05 NOTE — Patient Instructions (Addendum)
 Continue your exercise (pedaling   Add some strength training Add some strength training to your routine, this is important for bone and brain health and can reduce your risk of falls and help your body use insulin properly and regulate weight  Light weights, exercise bands , and internet videos are a good way to start  Yoga (chair or regular), machines , floor exercises or a gym with machines are also good options     For healthy weight  Avoid added sugars in your diet when you can  Try to get most of your carbohydrates from produce (with the exception of white potatoes) and whole grains Eat less bread/pasta/rice/snack foods/cereals/sweets and other items from the middle of the grocery store (processed carbs)  Eat lean protein  The following are examples of protein in diet  Meat - lean  Fish  Eggs  Dairy products  Soy products  Oat milk  Almond milk Legumes  Nuts and nut butters  Dried beans   I think your fatigue is coming from a cold/virus  If not improving in a few weeks let us  know    We will culture your urine and reach out with results  If positive we will treat

## 2024-11-07 ENCOUNTER — Ambulatory Visit: Payer: Self-pay | Admitting: Family Medicine

## 2024-11-07 LAB — URINE CULTURE
MICRO NUMBER:: 17532410
Result:: NO GROWTH
SPECIMEN QUALITY:: ADEQUATE

## 2025-01-28 ENCOUNTER — Ambulatory Visit: Admitting: Internal Medicine

## 2025-03-22 ENCOUNTER — Other Ambulatory Visit

## 2025-03-29 ENCOUNTER — Encounter: Admitting: Family Medicine
